# Patient Record
Sex: Female | Born: 1950
Health system: Southern US, Community
[De-identification: ages and names within clinical notes are randomized; demographics above are authoritative.]

## PROBLEM LIST (undated history)

## (undated) DIAGNOSIS — I10 Essential (primary) hypertension: Secondary | ICD-10-CM

## (undated) DIAGNOSIS — Z800 Family history of malignant neoplasm of digestive organs: Secondary | ICD-10-CM

## (undated) DIAGNOSIS — E785 Hyperlipidemia, unspecified: Secondary | ICD-10-CM

## (undated) DIAGNOSIS — M1990 Unspecified osteoarthritis, unspecified site: Secondary | ICD-10-CM

## (undated) DIAGNOSIS — Z8041 Family history of malignant neoplasm of ovary: Secondary | ICD-10-CM

## (undated) DIAGNOSIS — C801 Malignant (primary) neoplasm, unspecified: Secondary | ICD-10-CM

## (undated) DIAGNOSIS — K219 Gastro-esophageal reflux disease without esophagitis: Secondary | ICD-10-CM

## (undated) HISTORY — DX: Gastro-esophageal reflux disease without esophagitis: K21.9

## (undated) HISTORY — DX: Unspecified osteoarthritis, unspecified site: M19.90

## (undated) HISTORY — PX: ABDOMINAL HYSTERECTOMY: SHX81

## (undated) HISTORY — DX: Family history of malignant neoplasm of ovary: Z80.41

## (undated) HISTORY — DX: Family history of malignant neoplasm of digestive organs: Z80.0

## (undated) HISTORY — DX: Hyperlipidemia, unspecified: E78.5

## (undated) HISTORY — DX: Essential (primary) hypertension: I10

## (undated) HISTORY — PX: COLONOSCOPY: SHX174

## (undated) HISTORY — DX: Malignant (primary) neoplasm, unspecified: C80.1

---
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
## 1999-01-06 ENCOUNTER — Emergency Department (HOSPITAL_COMMUNITY): Admission: EM | Admit: 1999-01-06 | Discharge: 1999-01-06 | Payer: Self-pay | Admitting: Emergency Medicine

## 1999-05-17 ENCOUNTER — Other Ambulatory Visit: Admission: RE | Admit: 1999-05-17 | Discharge: 1999-05-17 | Payer: Self-pay | Admitting: Obstetrics & Gynecology

## 2000-05-02 ENCOUNTER — Encounter: Admission: RE | Admit: 2000-05-02 | Discharge: 2000-05-02 | Payer: Self-pay | Admitting: Obstetrics & Gynecology

## 2000-05-02 ENCOUNTER — Encounter: Payer: Self-pay | Admitting: Obstetrics & Gynecology

## 2000-05-30 ENCOUNTER — Other Ambulatory Visit: Admission: RE | Admit: 2000-05-30 | Discharge: 2000-05-30 | Payer: Self-pay | Admitting: Obstetrics & Gynecology

## 2002-06-04 ENCOUNTER — Other Ambulatory Visit: Admission: RE | Admit: 2002-06-04 | Discharge: 2002-06-04 | Payer: Self-pay | Admitting: Obstetrics & Gynecology

## 2002-07-17 ENCOUNTER — Encounter: Payer: Self-pay | Admitting: Obstetrics & Gynecology

## 2002-07-17 ENCOUNTER — Encounter: Admission: RE | Admit: 2002-07-17 | Discharge: 2002-07-17 | Payer: Self-pay | Admitting: Obstetrics & Gynecology

## 2003-03-24 ENCOUNTER — Encounter: Payer: Self-pay | Admitting: Obstetrics & Gynecology

## 2003-03-24 ENCOUNTER — Encounter: Admission: RE | Admit: 2003-03-24 | Discharge: 2003-03-24 | Payer: Self-pay | Admitting: Obstetrics & Gynecology

## 2003-10-15 ENCOUNTER — Encounter: Admission: RE | Admit: 2003-10-15 | Discharge: 2003-10-15 | Payer: Self-pay | Admitting: Obstetrics & Gynecology

## 2003-10-29 ENCOUNTER — Other Ambulatory Visit: Admission: RE | Admit: 2003-10-29 | Discharge: 2003-10-29 | Payer: Self-pay | Admitting: Obstetrics & Gynecology

## 2003-11-20 ENCOUNTER — Emergency Department (HOSPITAL_COMMUNITY): Admission: AD | Admit: 2003-11-20 | Discharge: 2003-11-20 | Payer: Self-pay | Admitting: Internal Medicine

## 2003-11-26 ENCOUNTER — Ambulatory Visit (HOSPITAL_BASED_OUTPATIENT_CLINIC_OR_DEPARTMENT_OTHER): Admission: RE | Admit: 2003-11-26 | Discharge: 2003-11-26 | Payer: Self-pay | Admitting: Orthopedic Surgery

## 2004-04-03 ENCOUNTER — Encounter: Admission: RE | Admit: 2004-04-03 | Discharge: 2004-04-03 | Payer: Self-pay | Admitting: Family Medicine

## 2004-11-02 ENCOUNTER — Other Ambulatory Visit: Admission: RE | Admit: 2004-11-02 | Discharge: 2004-11-02 | Payer: Self-pay | Admitting: Obstetrics & Gynecology

## 2005-03-30 ENCOUNTER — Encounter: Admission: RE | Admit: 2005-03-30 | Discharge: 2005-03-30 | Payer: Self-pay | Admitting: Internal Medicine

## 2005-04-25 ENCOUNTER — Other Ambulatory Visit: Admission: RE | Admit: 2005-04-25 | Discharge: 2005-04-25 | Payer: Self-pay | Admitting: Interventional Radiology

## 2005-04-25 ENCOUNTER — Encounter: Admission: RE | Admit: 2005-04-25 | Discharge: 2005-04-25 | Payer: Self-pay | Admitting: Internal Medicine

## 2005-07-16 ENCOUNTER — Encounter: Admission: RE | Admit: 2005-07-16 | Discharge: 2005-07-16 | Payer: Self-pay | Admitting: Internal Medicine

## 2005-08-23 ENCOUNTER — Ambulatory Visit: Payer: Self-pay | Admitting: Internal Medicine

## 2005-10-05 ENCOUNTER — Ambulatory Visit: Payer: Self-pay | Admitting: Internal Medicine

## 2005-10-15 ENCOUNTER — Ambulatory Visit: Payer: Self-pay | Admitting: Internal Medicine

## 2006-09-10 HISTORY — PX: BIOPSY THYROID: PRO38

## 2010-10-26 ENCOUNTER — Encounter: Payer: Self-pay | Admitting: Internal Medicine

## 2010-11-01 NOTE — Letter (Signed)
Summary: Colonoscopy Letter   Gastroenterology  [address] [place], [place] [address]   Phone: [phone]  Fax: [phone]      October 26, 2010 MRN: [id number]   [facility] [address] [place], [place]  [address]   Dear Ms. HUARD,   According to your medical record, it is time for you to schedule a Colonoscopy. The American Cancer Society recommends this procedure as a method to detect early colon cancer. Patients with a family history of colon cancer, or a personal history of colon polyps or inflammatory bowel disease are at increased risk.  This letter has been generated based on the recommendations made at the time of your procedure. If you feel that in your particular situation this may no longer apply, please contact our office.  Please call our office at [phone] to schedule this appointment or to update your records at your earliest convenience.  Thank you for cooperating with [place] to provide you with the very best care possible.   Sincerely,  Hedwig Morton. Juanda Chance, M.D.  [facility] [phone]

## 2012-05-27 ENCOUNTER — Encounter: Payer: Self-pay | Admitting: Internal Medicine

## 2012-06-30 ENCOUNTER — Other Ambulatory Visit: Payer: Self-pay | Admitting: Obstetrics & Gynecology

## 2013-07-13 ENCOUNTER — Other Ambulatory Visit: Payer: Self-pay | Admitting: Obstetrics & Gynecology

## 2013-11-27 ENCOUNTER — Other Ambulatory Visit: Payer: Self-pay | Admitting: Family

## 2013-11-27 DIAGNOSIS — I10 Essential (primary) hypertension: Secondary | ICD-10-CM

## 2013-11-27 DIAGNOSIS — Z8639 Personal history of other endocrine, nutritional and metabolic disease: Secondary | ICD-10-CM

## 2013-11-27 DIAGNOSIS — E785 Hyperlipidemia, unspecified: Secondary | ICD-10-CM

## 2013-11-27 DIAGNOSIS — R42 Dizziness and giddiness: Secondary | ICD-10-CM

## 2013-12-03 ENCOUNTER — Ambulatory Visit
Admission: RE | Admit: 2013-12-03 | Discharge: 2013-12-03 | Disposition: A | Discharge status: Home / Self Care | Payer: Commercial Managed Care - HMO | Source: Ambulatory Visit | Attending: Family | Admitting: Family

## 2013-12-03 DIAGNOSIS — E785 Hyperlipidemia, unspecified: Secondary | ICD-10-CM

## 2013-12-03 DIAGNOSIS — R42 Dizziness and giddiness: Secondary | ICD-10-CM

## 2013-12-03 DIAGNOSIS — I10 Essential (primary) hypertension: Secondary | ICD-10-CM

## 2013-12-03 DIAGNOSIS — Z8639 Personal history of other endocrine, nutritional and metabolic disease: Secondary | ICD-10-CM

## 2013-12-09 ENCOUNTER — Other Ambulatory Visit: Payer: Self-pay | Admitting: Family

## 2013-12-09 DIAGNOSIS — E041 Nontoxic single thyroid nodule: Secondary | ICD-10-CM

## 2013-12-18 ENCOUNTER — Encounter: Payer: Self-pay | Admitting: Internal Medicine

## 2013-12-18 ENCOUNTER — Ambulatory Visit (INDEPENDENT_AMBULATORY_CARE_PROVIDER_SITE_OTHER): Payer: Commercial Managed Care - HMO | Admitting: Internal Medicine

## 2013-12-18 VITALS — BP 100/68 | HR 53 | Ht <= 58 in | Wt 149.0 lb

## 2013-12-18 DIAGNOSIS — R079 Chest pain, unspecified: Secondary | ICD-10-CM | POA: Diagnosis not present

## 2013-12-18 DIAGNOSIS — E785 Hyperlipidemia, unspecified: Secondary | ICD-10-CM

## 2013-12-18 DIAGNOSIS — I10 Essential (primary) hypertension: Secondary | ICD-10-CM | POA: Diagnosis not present

## 2013-12-18 NOTE — Patient Instructions (Signed)
Talk to your doctor about possibly decreasing your metoprolol due to low HR  Your physician has requested that you have an exercise tolerance test. For further information please visit [email or web address]. Please also follow instruction sheet, as given. We will call you with the results.   Your physician recommends that you schedule a follow-up appointment as needed.

## 2013-12-18 NOTE — Progress Notes (Signed)
OFFICE NOTE  Chief Complaint:  Chest pain  Primary Care Physician: Melissa Levels, NP  HPI:  Melissa Simpson is a pleasant 63 year old female kindly referred to me for evaluation of chest pain. As you know she is a past medical history significant for hypertension and dyslipidemia. There is a family history of heart disease in her father as well as hypertension in the family. Recently she's been having some vague chest discomfort in the left side of the sternum. Symptoms are not necessarily worse with exertion or relieved by rest. It did not radiate. He described as a 1-2/10 in intensity. She has not noticed a recent increase in the symptoms. They are not worse after eating.  She denies any palpitations, diaphoresis, nausea, presyncope or syncopal symptoms. It should be noted that her blood pressure is low normal today and her heart rate is in the low 50s. She reports being asymptomatic as far as fatigue is concerned.  PMHx:  Past Medical History  Diagnosis Date  . GERD (gastroesophageal reflux disease)   . Hypertension   . Hyperlipidemia     Past Surgical History  Procedure Laterality Date  . Biopsy thyroid      FAMHx:  Family History  Problem Relation Age of Onset  . Hypertension Father   . Diabetes Father   . Coronary artery disease Father   . Hyperlipidemia Father   . Colon cancer Father   . Hypertension Mother     SOCHx:   reports that she has never smoked. She has never used smokeless tobacco. She reports that she does not drink alcohol or use illicit drugs.  ALLERGIES:  No Known Allergies  ROS: A comprehensive review of systems was negative except for: Cardiovascular: positive for chest pain  HOME MEDS: Current Outpatient Prescriptions  Medication Sig Dispense Refill  . losartan-hydrochlorothiazide (HYZAAR) 100-12.5 MG per tablet Take 1 tablet by mouth daily.      . metoprolol tartrate (LOPRESSOR) 25 MG tablet Take 25 mg by mouth 2 (two) times daily.        . Multiple Vitamin (CALCIUM COMPLEX PO) Take by mouth daily. 750mg  calcium, 150mg  magnesium, 300 IU vit D      . Multiple Vitamin (MULTIVITAMIN) tablet Take 2 tablets by mouth daily.      . Multiple Vitamins-Minerals (ANTIOXIDANT PO) Take by mouth daily.      Marland Kitchen omeprazole (PRILOSEC) 20 MG capsule Take 20 mg by mouth daily.      Marland Kitchen OVER THE COUNTER MEDICATION daily. Replenex - chondroitin, MSM, glucosamine, ginger, green tea      . OVER THE COUNTER MEDICATION daily. Phytomega - phytosterol esters 1000mg /fishoil 500mg /C&E      . Probiotic Product (PROBIOTIC DAILY PO) Take by mouth daily.      . simvastatin (ZOCOR) 20 MG tablet Take 20 mg by mouth daily.       No current facility-administered medications for this visit.    LABS/IMAGING: No results found for this or any previous visit (from the past 48 hour(s)). No results found.  VITALS: BP 100/68  Pulse 53  Ht 4\' 10"  (1.473 m)  Wt 149 lb (67.586 kg)  BMI 31.15 kg/m2  EXAM: General appearance: alert and no distress Neck: no carotid bruit and no JVD Lungs: clear to auscultation bilaterally Heart: regular rate and rhythm, S1, S2 normal, no murmur, click, rub or gallop Abdomen: soft, non-tender; bowel sounds normal; no masses,  no organomegaly Extremities: extremities normal, atraumatic, no cyanosis or edema Pulses: 2+  and symmetric Skin: Skin color, texture, turgor normal. No rashes or lesions Neurologic: Grossly normal Psych: Mood, affect normal  EKG: Sinus bradycardia at 53, nonspecific T wave changes  ASSESSMENT: 1. Atypical chest pain 2. Hypertension-controlled 3. Dyslipidemia-on statin  PLAN: 1.   Melissa Simpson is describing a chest discomfort which sounds somewhat atypical. It could be musculoskeletal or perhaps neuropathic in the chest wall. There does not seem to be association with exertion. She's not very physically active and is desiring to do more exercise. Based on this, a treadmill stress test for risk  stratification is feasible. Her blood pressure is well controlled and she is on cholesterol medication. Her heart rate is low normal with an associated low blood pressure today. If this persists, she may need to decrease her Lopressor to 12.5 mg twice daily. I will defer this to her primary care provider.  I will be in contact with her for followup with results of her treadmill stress test.  Thanks again for the kind referral.  Pixie Casino, MD, [facility] Attending Cardiologist [place] 12/18/2013, 3:12 PM

## 2013-12-24 ENCOUNTER — Other Ambulatory Visit (HOSPITAL_COMMUNITY)
Admission: RE | Admit: 2013-12-24 | Discharge: 2013-12-24 | Disposition: A | Discharge status: Home / Self Care | Payer: Medicare HMO | Source: Ambulatory Visit | Attending: Interventional Radiology | Admitting: Interventional Radiology

## 2013-12-24 ENCOUNTER — Ambulatory Visit
Admission: RE | Admit: 2013-12-24 | Discharge: 2013-12-24 | Disposition: A | Discharge status: Home / Self Care | Payer: Commercial Managed Care - HMO | Source: Ambulatory Visit | Attending: Family | Admitting: Family

## 2013-12-24 DIAGNOSIS — E041 Nontoxic single thyroid nodule: Secondary | ICD-10-CM

## 2013-12-31 ENCOUNTER — Encounter (HOSPITAL_COMMUNITY): Payer: Commercial Managed Care - HMO

## 2014-07-20 ENCOUNTER — Other Ambulatory Visit: Payer: Self-pay | Admitting: Obstetrics & Gynecology

## 2014-07-21 LAB — CYTOLOGY - PAP

## 2015-01-05 DIAGNOSIS — E781 Pure hyperglyceridemia: Secondary | ICD-10-CM | POA: Diagnosis not present

## 2015-01-05 DIAGNOSIS — N183 Chronic kidney disease, stage 3 (moderate): Secondary | ICD-10-CM | POA: Diagnosis not present

## 2015-01-05 DIAGNOSIS — I10 Essential (primary) hypertension: Secondary | ICD-10-CM | POA: Diagnosis not present

## 2015-01-05 DIAGNOSIS — R7301 Impaired fasting glucose: Secondary | ICD-10-CM | POA: Diagnosis not present

## 2015-01-05 DIAGNOSIS — K219 Gastro-esophageal reflux disease without esophagitis: Secondary | ICD-10-CM | POA: Diagnosis not present

## 2015-01-18 DIAGNOSIS — D271 Benign neoplasm of left ovary: Secondary | ICD-10-CM | POA: Diagnosis not present

## 2015-05-26 ENCOUNTER — Encounter: Payer: Self-pay | Admitting: *Deleted

## 2015-06-02 ENCOUNTER — Encounter: Payer: Self-pay | Admitting: Internal Medicine

## 2015-06-02 ENCOUNTER — Ambulatory Visit (INDEPENDENT_AMBULATORY_CARE_PROVIDER_SITE_OTHER): Payer: Commercial Managed Care - HMO | Admitting: Internal Medicine

## 2015-06-02 VITALS — BP 140/80 | HR 55 | Temp 97.9°F | Resp 20 | Ht <= 58 in | Wt 152.0 lb

## 2015-06-02 DIAGNOSIS — K219 Gastro-esophageal reflux disease without esophagitis: Secondary | ICD-10-CM | POA: Diagnosis not present

## 2015-06-02 DIAGNOSIS — E785 Hyperlipidemia, unspecified: Secondary | ICD-10-CM

## 2015-06-02 DIAGNOSIS — J320 Chronic maxillary sinusitis: Secondary | ICD-10-CM | POA: Diagnosis not present

## 2015-06-02 DIAGNOSIS — I10 Essential (primary) hypertension: Secondary | ICD-10-CM | POA: Diagnosis not present

## 2015-06-02 DIAGNOSIS — Z800 Family history of malignant neoplasm of digestive organs: Secondary | ICD-10-CM | POA: Diagnosis not present

## 2015-06-02 DIAGNOSIS — E041 Nontoxic single thyroid nodule: Secondary | ICD-10-CM

## 2015-06-02 MED ORDER — SIMVASTATIN 20 MG PO TABS: 20.0000 mg | ORAL_TABLET | Freq: Every day | ORAL | Status: DC

## 2015-06-02 MED ORDER — OMEPRAZOLE 20 MG PO CPDR: 20.0000 mg | DELAYED_RELEASE_CAPSULE | Freq: Every day | ORAL | Status: DC

## 2015-06-02 MED ORDER — METOPROLOL TARTRATE 25 MG PO TABS: 50.0000 mg | ORAL_TABLET | Freq: Two times a day (BID) | ORAL | Status: DC

## 2015-06-02 MED ORDER — LOSARTAN POTASSIUM-HCTZ 100-12.5 MG PO TABS: 1.0000 | ORAL_TABLET | Freq: Every day | ORAL | Status: DC

## 2015-06-02 NOTE — Progress Notes (Signed)
Patient ID: Melissa Simpson, female   DOB: 26-Jul-1951, 64 y.o.   MRN: [id number]   Location:  [facility]  Goals of Care: Advanced Directive information Does patient have an advance directive?: No, Would patient like information on creating an advanced directive?: Yes - Educational materials given  Notes she has done her will with her husband, but unsure if that part is included.  She's going to check.  Chief Complaint  Patient presents with  . Establish Care  . Medical Management of Chronic Issues    HPI: Patient is a 64 y.o. white female seen in the office today to establish care and for medical mgt of her chronic diseases. Her mother is also my patient.  Previous PCP:  NP Eloise Levels.  No active concerns.    GERD: Well controlled recently with omeprazole.  No problems since taking.  HTN:  Continues with hyzaar and lopressor.  Tolerating well.  Was 132 at home.  Changes between checks.  Did take her medication just before coming in.  Has been getting more exercise--got bowflex treadclimber--working up on it--has had it 2-3 wks.  Gets really short of breath at first.    Hyperlipidemia:  On zocor.  No muscle cramps.  Tolerating.    Gyn is Dr. Vania Rea.  Had pap, mammogram through him. Will return in Nov.  Had cscope 07/16/05 (CT virtual).    Takes black cohosh for hot flashes.  Feels it helps--does not stop them--less frequent.   Takes ca with D and Mag in it.   Has had bone density with Dr. Stann Mainland.  Had prevnar 9-06/2014 at [facility].  Influenza 2015 and already had this year.  Shingles 2014.      Depression screen PHQ 2/9 06/02/2015  Decreased Interest 0  Down, Depressed, Hopeless 0  PHQ - 2 Score 0   Fall Risk  06/02/2015  Falls in the past year? No   Caregiver stress:  Normally her mother does not create too much stress.  Mother feeds dogs and it makes them fight. Has had to separate the dogs.    Review of Systems:  Review of  Systems  Constitutional: Negative for fever, chills and malaise/fatigue.       Weight gain  HENT: Negative for congestion.        Sinus and allergy problems  Eyes: Negative for blurred vision.       Corrective lenses  Respiratory: Negative for cough and shortness of breath.   Cardiovascular: Negative for chest pain, palpitations and leg swelling.       HTN  Gastrointestinal: Negative for abdominal pain, constipation, blood in stool and melena.  Genitourinary: Negative for dysuria, urgency and frequency.  Musculoskeletal: Positive for joint pain. Negative for falls.       Thumbs, knees, lower back  Skin: Negative for rash.  Neurological: Positive for headaches. Negative for dizziness, loss of consciousness and weakness.  Endo/Heme/Allergies:       Hot flashes  Psychiatric/Behavioral: Negative for depression and memory loss.       Very minimal caregiver stress    Past Medical History  Diagnosis Date  . GERD (gastroesophageal reflux disease)   . Hypertension   . Hyperlipidemia     Past Surgical History  Procedure Laterality Date  . Biopsy thyroid    . Tonsilectomy, adenoidectomy, bilateral myringotomy and tubes Bilateral 1957   Social History   Social History  . Marital Status: Single    Spouse Name:  N/A  . Number of Children: N/A  . Years of Education: N/A   Occupational History  . Not on file.   Social History Main Topics  . Smoking status: Never Smoker   . Smokeless tobacco: Never Used  . Alcohol Use: No  . Drug Use: No  . Sexual Activity: Not on file   Other Topics Concern  . Not on file   Social History Narrative   Diet:Unrestricted   Do you drink/eat things with caffeine? Rarely   Marital status: Married                             What year were you married? 1992   Do you live in a house, apartment, assisted living, condo, trailer, etc)? House   Is it one or more stories? 1   How many persons live in your home? 3   Do you have any pets in your home?   2 small dogs   Current or past profession: Chief of Staff   Do you exercise?                                                     Type & how often:   Do you have a living will? No   Do you have a DNR Form? No   Do you have a POA/HPOA forms? NO   Family History  Problem Relation Age of Onset  . Hypertension Father   . Diabetes Father   . Coronary artery disease Father   . Hyperlipidemia Father   . Colon cancer Father   . Hypertension Mother   . Arthritis Mother     No Known Allergies Medications: Patient's Medications  New Prescriptions   No medications on file  Previous Medications   B COMPLEX VITAMINS TABLET    Take 1 tablet by mouth daily.   BLACK COHOSH 40 MG CAPS    Take 40 mg by mouth daily.   LOSARTAN-HYDROCHLOROTHIAZIDE (HYZAAR) 100-12.5 MG PER TABLET    Take 1 tablet by mouth daily.   METOPROLOL TARTRATE (LOPRESSOR) 25 MG TABLET    Take 50 mg by mouth 2 (two) times daily.    MULTIPLE VITAMIN (CALCIUM COMPLEX PO)    Take by mouth daily. 750mg  calcium, 150mg  magnesium, 300 IU vit D   MULTIPLE VITAMIN (MULTIVITAMIN) TABLET    Take 2 tablets by mouth daily.   OMEPRAZOLE (PRILOSEC) 20 MG CAPSULE    Take 20 mg by mouth daily.   OVER THE COUNTER MEDICATION    daily. Replenex - chondroitin, MSM, glucosamine, ginger, green tea   OVER THE COUNTER MEDICATION    daily. Phytomega - phytosterol esters 1000mg /fishoil 500mg /C&E   PROBIOTIC PRODUCT (PROBIOTIC DAILY PO)    Take by mouth daily.   SIMVASTATIN (ZOCOR) 20 MG TABLET    Take 20 mg by mouth daily.   VITAMIN C (ASCORBIC ACID) 500 MG TABLET    Take 500 mg by mouth daily.  Modified Medications   No medications on file  Discontinued Medications   No medications on file    Physical Exam: Filed Vitals:   06/02/15 0838  BP: 142/84  Pulse: 55  Temp: 97.9 F (36.6 C)  TempSrc: Oral  Resp: 20  Height: 4\' 10"  (1.473 m)  Weight: 152 lb (68.947  kg)  SpO2: 97%   Physical Exam  Constitutional: She is oriented to person,  place, and time. She appears well-developed and well-nourished. No distress.  Cardiovascular: Normal rate, regular rhythm, normal heart sounds and intact distal pulses.   Pulmonary/Chest: Effort normal and breath sounds normal. No respiratory distress.  Abdominal: Bowel sounds are normal.  Musculoskeletal: Normal range of motion.  Neurological: She is alert and oriented to person, place, and time.  Skin: Skin is warm and dry.  Repeat BP 140/80  Labs reviewed: Unsure when last labs were--awaiting records requested from [facility]   Assessment/Plan 1. Essential hypertension, benign -needed all of her meds renewed and sent to mail order pharmacy manually by fax she provided info for b/c "number in the system was wrong" per CMA -bp was slightly up this am -checks at home and was at goal -cont losartan/hcta and lopressor and reassess next visit -cont to monitor at home -cont 3x weekly exercise -discussed cutting back on sodium   2. Hyperlipidemia -cont zocor and reassess flp -encouraged exercise  3. Gastroesophageal reflux disease, esophagitis presence not specified -cont prilosec, asymptomatic while taking it  4. Family history of colon cancer -due for cscope in Nov 2016--is very hesitant about getting it  5. Chronic maxillary sinusitis -has tried otc allergy meds before, but does not feel they were helpful enough to continue  6.  Left thyroid nodule -had prior biopsy 12/24/13 of nodule--path must have been [facility] to [facility] b/c it's not in epic  Labs/tests ordered:    Orders Placed This Encounter  Procedures  . CBC with Differential/Platelet  . Comprehensive metabolic panel    Order Specific Question:  Has the patient fasted?    Answer:  Yes  . Hemoglobin A1c  . Lipid panel    Order Specific Question:  Has the patient fasted?    Answer:  Yes  . TSH    Next appt:  3 mos for annual exam (no pap, done at gyn)  Devola. Chamaine Stankus, D.O. [facility] [address], [place] [address] Cell Phone (Mon-Fri 8am-5pm):  [phone] On Call:  [phone] & follow prompts after 5pm & weekends Office Phone:  [phone] Office Fax:  [phone]

## 2015-06-02 NOTE — Patient Instructions (Addendum)
Colonoscopy is recommended in November.   Pneumovax can also be given at next appointment.

## 2015-06-03 DIAGNOSIS — R7989 Other specified abnormal findings of blood chemistry: Secondary | ICD-10-CM

## 2015-06-03 DIAGNOSIS — R945 Abnormal results of liver function studies: Principal | ICD-10-CM

## 2015-06-03 LAB — CBC WITH DIFFERENTIAL/PLATELET
Basophils Absolute: 0.1 10*3/uL (ref 0.0–0.2)
Basos: 1 %
EOS (ABSOLUTE): 0.3 10*3/uL (ref 0.0–0.4)
Eos: 4 %
Hematocrit: 43.8 % (ref 34.0–46.6)
Hemoglobin: 14.7 g/dL (ref 11.1–15.9)
Immature Grans (Abs): 0 10*3/uL (ref 0.0–0.1)
Immature Granulocytes: 0 %
Lymphocytes Absolute: 2.4 10*3/uL (ref 0.7–3.1)
Lymphs: 41 %
MCH: 32.6 pg (ref 26.6–33.0)
MCHC: 33.6 g/dL (ref 31.5–35.7)
MCV: 97 fL (ref 79–97)
Monocytes Absolute: 0.6 10*3/uL (ref 0.1–0.9)
Monocytes: 10 %
Neutrophils Absolute: 2.6 10*3/uL (ref 1.4–7.0)
Neutrophils: 44 %
Platelets: 264 10*3/uL (ref 150–379)
RBC: 4.51 x10E6/uL (ref 3.77–5.28)
RDW: 12.9 % (ref 12.3–15.4)
WBC: 6 10*3/uL (ref 3.4–10.8)

## 2015-06-03 LAB — COMPREHENSIVE METABOLIC PANEL
ALT: 46 IU/L — ABNORMAL HIGH (ref 0–32)
AST: 33 IU/L (ref 0–40)
Albumin/Globulin Ratio: 1.6 (ref 1.1–2.5)
Albumin: 4.3 g/dL (ref 3.6–4.8)
Alkaline Phosphatase: 38 IU/L — ABNORMAL LOW (ref 39–117)
BUN/Creatinine Ratio: 15 (ref 11–26)
BUN: 17 mg/dL (ref 8–27)
Bilirubin Total: 0.9 mg/dL (ref 0.0–1.2)
CO2: 24 mmol/L (ref 18–29)
Calcium: 9.7 mg/dL (ref 8.7–10.3)
Chloride: 101 mmol/L (ref 97–108)
Creatinine, Ser: 1.12 mg/dL — ABNORMAL HIGH (ref 0.57–1.00)
GFR calc Af Amer: 59 mL/min/{1.73_m2} — ABNORMAL LOW (ref >59)
GFR calc non Af Amer: 51 mL/min/{1.73_m2} — ABNORMAL LOW (ref >59)
Globulin, Total: 2.7 g/dL (ref 1.5–4.5)
Glucose: 102 mg/dL — ABNORMAL HIGH (ref 65–99)
Potassium: 3.9 mmol/L (ref 3.5–5.2)
Sodium: 142 mmol/L (ref 134–144)
Total Protein: 7 g/dL (ref 6.0–8.5)

## 2015-06-03 LAB — LIPID PANEL
Chol/HDL Ratio: 3.6 ratio units (ref 0.0–4.4)
Cholesterol, Total: 146 mg/dL (ref 100–199)
HDL: 41 mg/dL (ref >39)
LDL Calculated: 65 mg/dL (ref 0–99)
Triglycerides: 200 mg/dL — ABNORMAL HIGH (ref 0–149)
VLDL Cholesterol Cal: 40 mg/dL (ref 5–40)

## 2015-06-03 LAB — HEMOGLOBIN A1C
Est. average glucose Bld gHb Est-mCnc: 126 mg/dL
Hgb A1c MFr Bld: 6 % — ABNORMAL HIGH (ref 4.8–5.6)

## 2015-06-03 LAB — TSH: TSH: 3.03 u[IU]/mL (ref 0.450–4.500)

## 2015-06-22 ENCOUNTER — Other Ambulatory Visit: Payer: Commercial Managed Care - HMO

## 2015-06-22 DIAGNOSIS — R7989 Other specified abnormal findings of blood chemistry: Secondary | ICD-10-CM

## 2015-06-22 DIAGNOSIS — R945 Abnormal results of liver function studies: Principal | ICD-10-CM

## 2015-06-23 ENCOUNTER — Telehealth: Payer: Self-pay

## 2015-06-23 LAB — HEPATIC FUNCTION PANEL
ALT: 52 IU/L — ABNORMAL HIGH (ref 0–32)
AST: 33 IU/L (ref 0–40)
Albumin: 4.2 g/dL (ref 3.6–4.8)
Alkaline Phosphatase: 40 IU/L (ref 39–117)
Bilirubin Total: 1 mg/dL (ref 0.0–1.2)
Bilirubin, Direct: 0.2 mg/dL (ref 0.00–0.40)
Total Protein: 6.8 g/dL (ref 6.0–8.5)

## 2015-06-23 NOTE — Telephone Encounter (Signed)
Per Dr.Reed- Please notify Melissa Simpson that her liver panel has improved  Patient aware of results and verbalized understanding. Patient questioned if she should remain off statin therapy. I asked Dr.Reed- patient to stay off medication until  Instructed to restart, LFT will be followed up at next ov. Patient verbalized understanding. Removed Statin form medication list

## 2015-07-26 DIAGNOSIS — Z6832 Body mass index (BMI) 32.0-32.9, adult: Secondary | ICD-10-CM | POA: Diagnosis not present

## 2015-07-26 DIAGNOSIS — Z1231 Encounter for screening mammogram for malignant neoplasm of breast: Secondary | ICD-10-CM | POA: Diagnosis not present

## 2015-07-26 DIAGNOSIS — Z01419 Encounter for gynecological examination (general) (routine) without abnormal findings: Secondary | ICD-10-CM | POA: Diagnosis not present

## 2015-08-29 ENCOUNTER — Ambulatory Visit (INDEPENDENT_AMBULATORY_CARE_PROVIDER_SITE_OTHER): Payer: Commercial Managed Care - HMO | Admitting: Internal Medicine

## 2015-08-29 ENCOUNTER — Encounter: Payer: Self-pay | Admitting: Internal Medicine

## 2015-08-29 VITALS — BP 138/82 | HR 52 | Temp 97.9°F | Ht <= 58 in | Wt 151.0 lb

## 2015-08-29 DIAGNOSIS — Z0000 Encounter for general adult medical examination without abnormal findings: Secondary | ICD-10-CM | POA: Diagnosis not present

## 2015-08-29 DIAGNOSIS — I10 Essential (primary) hypertension: Secondary | ICD-10-CM

## 2015-08-29 DIAGNOSIS — Z23 Encounter for immunization: Secondary | ICD-10-CM | POA: Diagnosis not present

## 2015-08-29 DIAGNOSIS — E785 Hyperlipidemia, unspecified: Secondary | ICD-10-CM

## 2015-08-29 DIAGNOSIS — R945 Abnormal results of liver function studies: Secondary | ICD-10-CM

## 2015-08-29 DIAGNOSIS — R7989 Other specified abnormal findings of blood chemistry: Secondary | ICD-10-CM

## 2015-08-29 DIAGNOSIS — R748 Abnormal levels of other serum enzymes: Secondary | ICD-10-CM | POA: Diagnosis not present

## 2015-08-29 DIAGNOSIS — Z780 Asymptomatic menopausal state: Secondary | ICD-10-CM

## 2015-08-29 DIAGNOSIS — K219 Gastro-esophageal reflux disease without esophagitis: Secondary | ICD-10-CM

## 2015-08-29 NOTE — Addendum Note (Signed)
Addended by: Ripley Fraise on: 08/29/2015 10:30 AM   Modules accepted: Orders

## 2015-08-29 NOTE — Progress Notes (Signed)
Patient ID: Melissa Simpson, female   DOB: 08/28/1951, 64 y.o.   MRN: [id number]   Location: [place]  Provider: Rexene Edison. Mariea Clonts, D.O., C.M.D.  Code Status: full code Goals of Care: Advanced Directive information Does patient have an advance directive?: No, Would patient like information on creating an advanced directive?: Yes - Educational materials given  Chief Complaint  Patient presents with  . Annual Exam    Wellness exam    HPI: Patient is a 64 y.o. female seen in the office today for an annual wellness exam and medical mgt of chronic diseases.    Depression screen [facility] 2/9 08/29/2015 06/02/2015  Decreased Interest 0 0  Down, Depressed, Hopeless 0 0  PHQ - 2 Score 0 0    Fall Risk  08/29/2015 06/02/2015  Falls in the past year? No No   Memory is intact.    Health Maintenance  Topic Date Due  . Hepatitis C Screening  [birth year]  . TETANUS/TDAP  09/18/1967  . DEXA SCAN  09/17/2013  . PNA vac Low Risk Adult (1 of 2 - PCV13) 09/17/2013  . COLONOSCOPY  07/17/2015  . INFLUENZA VACCINE  04/10/2016  . MAMMOGRAM  07/11/2016  . ZOSTAVAX  Completed   Urinary incontinence?  No difficulty. Functional status?  Independent. Exercise?  It dwindled.  Has to do it early enough in the am to get showered and get her mom ready. Diet?  Not following a special diet at this time. Vision:  Admits to some difficulty with distance vision.  Could not get an answer when called [employer]--says it's been at least 2 years since she had them done.  Is having headaches on either side of her head--pressure.   Also has sinus congestion and drainage.   Hearing:  Is hearing well. Dentition:  No difficulty in this dept.  Goes next month to dentist. Had mammogram last month.  [place] ob[facility] ordered and also does pelvic exam. Hep C screening:  Has never had transfusion.    No problems from her acid indigestion.  On omeprazole.    HTN:  bp is at goal with her current medication--hyzaar,  metoprolol  HL:  Working on diet and exercise, but needs to improve this.  Elevated lfts:  Reason unclear.    Review of Systems:  Review of Systems  Constitutional: Negative for fever, chills, weight loss and malaise/fatigue.  HENT: Positive for congestion. Negative for hearing loss.   Eyes: Positive for blurred vision.       At distances--needs eye exam at [facility]  Respiratory: Negative for shortness of breath.   Cardiovascular: Negative for chest pain and leg swelling.  Gastrointestinal: Positive for heartburn. Negative for abdominal pain, constipation, blood in stool and melena.       Well controlled  Genitourinary: Negative for dysuria, urgency and frequency.  Musculoskeletal: Positive for joint pain. Negative for myalgias and falls.       Knees occasionally  Skin: Negative for rash.  Neurological: Negative for dizziness, loss of consciousness and weakness.  Endo/Heme/Allergies: Does not bruise/bleed easily.  Psychiatric/Behavioral: Negative for depression and memory loss. The patient is not nervous/anxious and does not have insomnia.     Past Medical History  Diagnosis Date  . GERD (gastroesophageal reflux disease)   . Hypertension   . Hyperlipidemia     Past Surgical History  Procedure Laterality Date  . Biopsy thyroid    . Tonsilectomy, adenoidectomy, bilateral myringotomy and tubes Bilateral 1957    The patient  has a family history of  shoulder  No Known Allergies    Medication List       This list is accurate as of: 08/29/15 10:05 AM.  Always use your most recent med list.               b complex vitamins tablet  Take 1 tablet by mouth daily.     Black Cohosh 40 MG Caps  Take 40 mg by mouth daily.     losartan-hydrochlorothiazide 100-12.5 MG tablet  Commonly known as:  HYZAAR  Take 1 tablet by mouth daily.     metoprolol tartrate 25 MG tablet  Commonly known as:  LOPRESSOR  Take 2 tablets (50 mg total) by mouth 2 (two) times daily.      multivitamin tablet  Take 2 tablets by mouth daily.     CALCIUM COMPLEX PO  Take by mouth daily. 750mg  calcium, 150mg  magnesium, 300 IU vit D     omeprazole 20 MG capsule  Commonly known as:  PRILOSEC  Take 1 capsule (20 mg total) by mouth daily.     OVER THE COUNTER MEDICATION  daily. Replenex - chondroitin, MSM, glucosamine, ginger, green tea     OVER THE COUNTER MEDICATION  daily. Phytomega - phytosterol esters 1000mg /fishoil 500mg /C&E     PROBIOTIC DAILY PO  Take by mouth daily.     vitamin C 500 MG tablet  Commonly known as:  ASCORBIC ACID  Take 500 mg by mouth daily.        Physical Exam: Filed Vitals:   08/29/15 0917  BP: 138/82  Pulse: 52  Temp: 97.9 F (36.6 C)  TempSrc: Oral  Height: 4\' 10"  (1.473 m)  Weight: 151 lb (68.493 kg)  SpO2: 95%   Body mass index is 31.57 kg/(m^2). Physical Exam  Constitutional: She is oriented to person, place, and time. She appears well-developed and well-nourished.  HENT:  Head: Normocephalic and atraumatic.  Right Ear: External ear normal.  Left Ear: External ear normal.  Nose: Nose normal.  Mouth/Throat: Oropharynx is clear and moist. No oropharyngeal exudate.  No cerumen; TMs pink with normal light reflex  Eyes: Conjunctivae and EOM are normal. Pupils are equal, round, and reactive to light.  Neck: Normal range of motion. Neck supple. No JVD present. No tracheal deviation present. No thyromegaly present.  Cardiovascular: Normal rate, regular rhythm, normal heart sounds and intact distal pulses.   Abdominal: Soft. Bowel sounds are normal. She exhibits no distension and no mass. There is no tenderness. There is no rebound and no guarding.  Genitourinary:  Deferred--breast and pelvic, rectal exams done by gyn  Musculoskeletal: Normal range of motion. She exhibits no tenderness.  Lymphadenopathy:    She has no cervical adenopathy.  Neurological: She is alert and oriented to person, place, and time. She has normal  reflexes. No cranial nerve deficit.  Skin: Skin is warm and dry.  Psychiatric: She has a normal mood and affect. Her behavior is normal. Judgment and thought content normal.    Labs reviewed: Basic Metabolic Panel:  Recent Labs  06/02/15 1000  NA 142  K 3.9  CL 101  CO2 24  GLUCOSE 102*  BUN 17  CREATININE 1.12*  CALCIUM 9.7  TSH 3.030   Liver Function Tests:  Recent Labs  06/02/15 1000 06/22/15 0905  AST 33 33  ALT 46* 52*  ALKPHOS 38* 40  BILITOT 0.9 1.0  PROT 7.0 6.8  ALBUMIN 4.3 4.2   No results for  input(s): LIPASE, AMYLASE in the last 8760 hours. No results for input(s): AMMONIA in the last 8760 hours. CBC:  Recent Labs  06/02/15 1000  WBC 6.0  NEUTROABS 2.6  HCT 43.8   Lipid Panel:  Recent Labs  06/02/15 1000  CHOL 146  HDL 41  LDLCALC 65  TRIG 200*  CHOLHDL 3.6   Lab Results  Component Value Date   HGBA1C 6.0* 06/02/2015    Assessment/Plan 1. Medicare annual wellness visit, initial -pneumovax given today -work on diet and exercise -get f/u bone density done   2. Primary hypertension - EKG 12-Lead showed NSR at 50bpm, no acute ischemia or infarct -well controlled with current meds  3. Elevated liver function tests - f/u panel today--etiology unclear - Hepatic Function Panel  4. Hyperlipidemia - not on meds--did not tolerate statin. -cont to work on diet and exercise - Lipid panel  5. Gastroesophageal reflux disease, esophagitis presence not specified -stable with omeprazole  6. Postmenopausal estrogen deficiency - overdue for f/u bone density - DG Bone Density; Future  Labs/tests ordered:   Orders Placed This Encounter  Procedures  . DG Bone Density    Standing Status: Future     Number of Occurrences:      Standing Expiration Date: 10/28/2016    Order Specific Question:  Reason for Exam (SYMPTOM  OR DIAGNOSIS REQUIRED)    Answer:  postmenopausal, last study 2011 (heel scan at [facility])    Order Specific  Question:  Preferred imaging location?    Answer:  [facility]  . Lipid panel    Order Specific Question:  Has the patient fasted?    Answer:  Yes  . Hepatic Function Panel  . EKG 12-Lead    Next appt:  6 mos for med mgt  Eber Ferrufino L. Yenesis Even, D.O. [facility] [address], [place] [address] Cell Phone (Mon-Fri 8am-5pm):  [phone] On Call:  [phone] & follow prompts after 5pm & weekends Office Phone:  [phone] Office Fax:  [phone]

## 2015-08-30 LAB — LIPID PANEL
Chol/HDL Ratio: 6.1 ratio units — ABNORMAL HIGH (ref 0.0–4.4)
Cholesterol, Total: 239 mg/dL — ABNORMAL HIGH (ref 100–199)
HDL: 39 mg/dL — ABNORMAL LOW (ref >39)
LDL Calculated: 146 mg/dL — ABNORMAL HIGH (ref 0–99)
Triglycerides: 269 mg/dL — ABNORMAL HIGH (ref 0–149)
VLDL Cholesterol Cal: 54 mg/dL — ABNORMAL HIGH (ref 5–40)

## 2015-08-30 LAB — HEPATIC FUNCTION PANEL
ALT: 40 IU/L — ABNORMAL HIGH (ref 0–32)
AST: 29 IU/L (ref 0–40)
Albumin: 4.4 g/dL (ref 3.6–4.8)
Alkaline Phosphatase: 40 IU/L (ref 39–117)
Bilirubin Total: 0.8 mg/dL (ref 0.0–1.2)
Bilirubin, Direct: 0.17 mg/dL (ref 0.00–0.40)
Total Protein: 7.1 g/dL (ref 6.0–8.5)

## 2015-08-31 LAB — HEPATITIS PANEL, ACUTE
Hep A IgM: NEGATIVE
Hep B C IgM: NEGATIVE
Hep C Virus Ab: <0.1 s/co ratio (ref 0.0–0.9)
Hepatitis B Surface Ag: NEGATIVE

## 2015-08-31 LAB — SPECIMEN STATUS REPORT

## 2015-08-31 NOTE — Addendum Note (Signed)
Addended byMarisa Cyphers C on: 08/31/2015 03:17 PM   Modules accepted: Orders

## 2015-09-09 ENCOUNTER — Ambulatory Visit
Admission: RE | Admit: 2015-09-09 | Discharge: 2015-09-09 | Disposition: A | Discharge status: Home / Self Care | Payer: Commercial Managed Care - HMO | Source: Ambulatory Visit | Attending: Internal Medicine | Admitting: Internal Medicine

## 2015-09-09 DIAGNOSIS — R7989 Other specified abnormal findings of blood chemistry: Secondary | ICD-10-CM

## 2015-09-09 DIAGNOSIS — R945 Abnormal results of liver function studies: Principal | ICD-10-CM

## 2015-09-22 ENCOUNTER — Encounter: Payer: Self-pay | Admitting: Internal Medicine

## 2015-10-03 ENCOUNTER — Other Ambulatory Visit: Payer: Self-pay | Admitting: Internal Medicine

## 2015-10-03 DIAGNOSIS — E2839 Other primary ovarian failure: Secondary | ICD-10-CM

## 2015-10-14 ENCOUNTER — Ambulatory Visit (INDEPENDENT_AMBULATORY_CARE_PROVIDER_SITE_OTHER): Payer: Commercial Managed Care - HMO | Admitting: Internal Medicine

## 2015-10-14 ENCOUNTER — Encounter: Payer: Self-pay | Admitting: Internal Medicine

## 2015-10-14 VITALS — BP 130/80 | HR 69 | Temp 97.5°F | Resp 18 | Wt 150.4 lb

## 2015-10-14 DIAGNOSIS — J0110 Acute frontal sinusitis, unspecified: Secondary | ICD-10-CM

## 2015-10-14 MED ORDER — AZITHROMYCIN 250 MG PO TABS: ORAL_TABLET | ORAL | Status: DC

## 2015-10-14 NOTE — Patient Instructions (Addendum)
Move humidifier to your room temporarily.  Drink plenty of water.  Salt water rinses for sore throat.  Lozenges may also help.  Try using your nettipot to clean out your sinuses.  You may use alka seltzer cold and flu packets day and night.    Use mucinex 600mg  twice daily for congestion.  If you are not starting to feel better by Monday, you may fill the antibiotic (zpak) prescription.

## 2015-10-14 NOTE — Progress Notes (Signed)
Patient ID: Melissa Simpson, female   DOB: 03-20-1951, 65 y.o.   MRN: [id number]   Location: [place] Provider: Rexene Edison. Mariea Clonts, D.O., C.M.D.  Goals of Care: Advanced Directive information Advanced Directives 08/29/2015  Does patient have an advance directive? No  Would patient like information on creating an advanced directive? Yes - Multimedia programmer Complaint  Patient presents with  . Acute Visit    sore throat for 3 days, using chloraseptic spray    HPI: Patient is a 65 y.o. female seen in the office today for an acute visit for a sore throat for 3 days.  She's been using chloraseptic spray.First day, not bad, got worse yesterday and didn't sleep much last night waking up hurting.  Now has headache.  Had a little diarrhea in the night (unsure if related or from flounder in there).  Feels like mucus sitting in her throat.  Can't get it up or out.  Is afraid she'll quit breathing at night.  No fever here, and no chills at home.  No change in coughing (has chronic sinus drainage and some suspected gerd). A bit hoarse today.  No sick contacts known.  Appetite decreased this am.  Did not eat.  Throat hurts too much, too.  Increased nasal pressure also.    Review of Systems:  Review of Systems  Constitutional: Positive for malaise/fatigue. Negative for fever and chills.  HENT: Positive for congestion and sore throat. Negative for ear pain and hearing loss.        Hoarse  Eyes: Negative for pain, discharge and redness.  Respiratory: Positive for cough. Negative for sputum production, shortness of breath and wheezing.        Chronic cough, unchanged  Cardiovascular: Negative for chest pain.       Notes a pressure last night, indigestion and later diarrhea  Gastrointestinal: Positive for heartburn and diarrhea. Negative for nausea, vomiting, abdominal pain, constipation, blood in stool and melena.  Genitourinary: Negative for dysuria.  Musculoskeletal: Negative  for myalgias.  Skin: Negative for rash.  Neurological: Positive for headaches.    Past Medical History  Diagnosis Date  . GERD (gastroesophageal reflux disease)   . Hypertension   . Hyperlipidemia     Past Surgical History  Procedure Laterality Date  . Biopsy thyroid    . Tonsilectomy, adenoidectomy, bilateral myringotomy and tubes Bilateral 1957    No Known Allergies    Medication List       This list is accurate as of: 10/14/15 10:42 AM.  Always use your most recent med list.               b complex vitamins tablet  Take 1 tablet by mouth daily.     Black Cohosh 40 MG Caps  Take 40 mg by mouth daily.     losartan-hydrochlorothiazide 100-12.5 MG tablet  Commonly known as:  HYZAAR  Take 1 tablet by mouth daily.     metoprolol tartrate 25 MG tablet  Commonly known as:  LOPRESSOR  Take 2 tablets (50 mg total) by mouth 2 (two) times daily.     multivitamin tablet  Take 2 tablets by mouth daily.     CALCIUM COMPLEX PO  Take by mouth daily. 750mg  calcium, 150mg  magnesium, 300 IU vit D     omeprazole 20 MG capsule  Commonly known as:  PRILOSEC  Take 1 capsule (20 mg total) by mouth daily.     OVER THE COUNTER  MEDICATION  daily. Replenex - chondroitin, MSM, glucosamine, ginger, green tea     OVER THE COUNTER MEDICATION  daily. Phytomega - phytosterol esters 1000mg /fishoil 500mg /C&E     PROBIOTIC DAILY PO  Take by mouth daily.     simvastatin 20 MG tablet  Commonly known as:  ZOCOR  Take 20 mg by mouth at bedtime.     vitamin C 500 MG tablet  Commonly known as:  ASCORBIC ACID  Take 500 mg by mouth daily.        Physical Exam: Filed Vitals:   10/14/15 1031  BP: 130/80  Pulse: 69  Temp: 97.5 F (36.4 C)  TempSrc: Oral  Resp: 18  Weight: 150 lb 6.4 oz (68.221 kg)  SpO2: 95%   Body mass index is 31.44 kg/(m^2). Physical Exam  Constitutional: She appears well-developed and well-nourished. No distress.  HENT:  Head: Normocephalic and  atraumatic.  Right Ear: External ear normal.  Left Ear: External ear normal.  Nose: Nose normal.  Mouth/Throat: Oropharynx is clear and moist. No oropharyngeal exudate.  Erythema of oropharynx, but no visible plaques/white exudate; pressure over right frontal sinus; both tms pink with normal light reflex intact  Eyes: Conjunctivae are normal.  Neck: Neck supple.  Cardiovascular: Normal rate, regular rhythm and normal heart sounds.   Pulmonary/Chest: Effort normal and breath sounds normal. No respiratory distress. She has no wheezes. She has no rales.  Scarce upper airway congestive sounds anteriorly  Lymphadenopathy:    She has no cervical adenopathy.  Skin: Skin is warm and dry.  Psychiatric: She has a normal mood and affect.    Labs reviewed: Basic Metabolic Panel:  Recent Labs  06/02/15 1000  NA 142  K 3.9  CL 101  CO2 24  GLUCOSE 102*  BUN 17  CREATININE 1.12*  CALCIUM 9.7  TSH 3.030   Liver Function Tests:  Recent Labs  06/02/15 1000 06/22/15 0905 08/29/15 1016  AST 33 33 29  ALT 46* 52* 40*  ALKPHOS 38* 40 40  BILITOT 0.9 1.0 0.8  PROT 7.0 6.8 7.1  ALBUMIN 4.3 4.2 4.4   No results for input(s): LIPASE, AMYLASE in the last 8760 hours. No results for input(s): AMMONIA in the last 8760 hours. CBC:  Recent Labs  06/02/15 1000  WBC 6.0  NEUTROABS 2.6  HCT 43.8  MCV 97  PLT 264   Lipid Panel:  Recent Labs  06/02/15 1000 08/29/15 1016  CHOL 146 239*  HDL 41 39*  LDLCALC 65 146*  TRIG 200* 269*  CHOLHDL 3.6 6.1*   Lab Results  Component Value Date   HGBA1C 6.0* 06/02/2015    Assessment/Plan 1. Acute frontal sinusitis, recurrence not specified - advised to use conservative therapies including humifier, hydration, mucinex, salt water rinses for sore throat, lozenges if needed, nettipot to help clean out sinuses and decrease PND, alka seltzer cold and flu packets temporarily -if not improving by Monday (infection will not be gone but should  be getting better), fill zpak Rx - azithromycin (ZITHROMAX) 250 MG tablet; Two tabs first day, one tab daily for 4 days thereafter  Dispense: 6 tablet; Refill: 0  Labs/tests ordered:  No new Next appt:  Keep 02/27/2016 appt, return prn  Jenyfer Trawick L. Asuzena Weis, D.O. [facility] [address], [place] [address] Cell Phone (Mon-Fri 8am-5pm):  [phone] On Call:  [phone] & follow prompts after 5pm & weekends Office Phone:  [phone] Office Fax:  [phone]

## 2015-10-25 ENCOUNTER — Ambulatory Visit
Admission: RE | Admit: 2015-10-25 | Discharge: 2015-10-25 | Disposition: A | Discharge status: Home / Self Care | Payer: Commercial Managed Care - HMO | Source: Ambulatory Visit | Attending: Internal Medicine | Admitting: Internal Medicine

## 2015-10-25 DIAGNOSIS — M810 Age-related osteoporosis without current pathological fracture: Secondary | ICD-10-CM | POA: Diagnosis not present

## 2015-10-25 DIAGNOSIS — E2839 Other primary ovarian failure: Secondary | ICD-10-CM

## 2015-10-26 ENCOUNTER — Other Ambulatory Visit: Payer: Self-pay | Admitting: *Deleted

## 2015-10-26 MED ORDER — ALENDRONATE SODIUM 70 MG PO TABS: 70.0000 mg | ORAL_TABLET | ORAL | Status: DC

## 2015-12-06 DIAGNOSIS — I10 Essential (primary) hypertension: Secondary | ICD-10-CM | POA: Diagnosis not present

## 2015-12-06 DIAGNOSIS — H5210 Myopia, unspecified eye: Secondary | ICD-10-CM | POA: Diagnosis not present

## 2016-01-10 ENCOUNTER — Other Ambulatory Visit: Payer: Self-pay | Admitting: Internal Medicine

## 2016-02-27 ENCOUNTER — Encounter: Payer: Self-pay | Admitting: Internal Medicine

## 2016-02-27 ENCOUNTER — Ambulatory Visit (INDEPENDENT_AMBULATORY_CARE_PROVIDER_SITE_OTHER): Payer: Commercial Managed Care - HMO | Admitting: Internal Medicine

## 2016-02-27 VITALS — BP 120/80 | HR 50 | Temp 98.0°F | Ht <= 58 in | Wt 154.0 lb

## 2016-02-27 DIAGNOSIS — I10 Essential (primary) hypertension: Secondary | ICD-10-CM | POA: Diagnosis not present

## 2016-02-27 DIAGNOSIS — R7989 Other specified abnormal findings of blood chemistry: Secondary | ICD-10-CM | POA: Diagnosis not present

## 2016-02-27 DIAGNOSIS — M25511 Pain in right shoulder: Secondary | ICD-10-CM | POA: Diagnosis not present

## 2016-02-27 DIAGNOSIS — G8929 Other chronic pain: Secondary | ICD-10-CM | POA: Diagnosis not present

## 2016-02-27 DIAGNOSIS — M79672 Pain in left foot: Secondary | ICD-10-CM

## 2016-02-27 DIAGNOSIS — M25551 Pain in right hip: Secondary | ICD-10-CM

## 2016-02-27 DIAGNOSIS — Z636 Dependent relative needing care at home: Secondary | ICD-10-CM

## 2016-02-27 DIAGNOSIS — E785 Hyperlipidemia, unspecified: Secondary | ICD-10-CM | POA: Diagnosis not present

## 2016-02-27 DIAGNOSIS — R945 Abnormal results of liver function studies: Secondary | ICD-10-CM

## 2016-02-27 NOTE — Progress Notes (Signed)
Location:  [facility] Provider:  Jolena Kittle L. Mariea Clonts, D.O., C.M.D.  Code Status: full code Goals of Care:  Advanced Directives 08/29/2015  Does patient have an advance directive? No  Would patient like information on creating an advanced directive? Yes - Geneticist, molecular Complaint  Patient presents with  . Medical Management of Chronic Issues    63mth follow-up    HPI: Patient is a 65 y.o. female seen today for medical management of chronic diseases.    Basically ok except new aches and pains.  Years ago she had an MRI of her right shoulder--nothing was done about something being torn.  Quit using her right arm for her purse.  Started back up.  Hurts if she reaches for things.  Arthritis rub helps sometimes, but not always.    Left bottom of her food is puffy and feels like little pins are in there   Only sometimes hurts, not all of the time.  Only in the ball of her foot, nowhere else.    Discussed diet and exercise.  Asks for handouts on diet.  Not really exercising.  Tried the bowflex treadclimber.  Wears her out in less than 10 mins.  Right hip joint hurts when she walks.  That starts to bother her when she walks.  Does not know how to swim.  Might try her bike slowly again.    Past Medical History  Diagnosis Date  . GERD (gastroesophageal reflux disease)   . Hypertension   . Hyperlipidemia     Past Surgical History  Procedure Laterality Date  . Biopsy thyroid    . Tonsilectomy, adenoidectomy, bilateral myringotomy and tubes Bilateral 1957    No Known Allergies    Medication List       This list is accurate as of: 02/27/16 10:36 AM.  Always use your most recent med list.               alendronate 70 MG tablet  Commonly known as:  FOSAMAX  TAKE 1 TABLET (70 MG TOTAL) BY MOUTH ONCE A WEEK. TAKE WITH A FULL GLASS OF WATER ON AN EMPTY STOMACH.     b complex vitamins tablet  Take 1 tablet by mouth daily.     Black Cohosh 40 MG Caps  Take 40 mg  by mouth daily.     CALCIUM 600 + MINERALS PO  Take by mouth 2 (two) times daily.     losartan-hydrochlorothiazide 100-12.5 MG tablet  Commonly known as:  HYZAAR  Take 1 tablet by mouth daily.     metoprolol 50 MG tablet  Commonly known as:  LOPRESSOR  Take 50 mg by mouth 2 (two) times daily.     multivitamin tablet  Take 2 tablets by mouth daily.     omeprazole 20 MG capsule  Commonly known as:  PRILOSEC  Take 1 capsule (20 mg total) by mouth daily.     OVER THE COUNTER MEDICATION  daily. Replenex - chondroitin, MSM, glucosamine, ginger, green tea     OVER THE COUNTER MEDICATION  daily. Phytomega - phytosterol esters 1000mg /fishoil 500mg /C&E     PROBIOTIC DAILY PO  Take by mouth daily.     simvastatin 20 MG tablet  Commonly known as:  ZOCOR  Take 20 mg by mouth at bedtime.     vitamin C 500 MG tablet  Commonly known as:  ASCORBIC ACID  Take 500 mg by mouth daily.       Review  of Systems:  Review of Systems  Constitutional: Negative for fever and chills.  HENT: Negative for hearing loss.   Eyes: Negative for blurred vision.  Respiratory: Negative for shortness of breath.   Cardiovascular: Negative for chest pain and leg swelling.  Gastrointestinal: Negative for abdominal pain, constipation, blood in stool and melena.  Genitourinary: Negative for dysuria, urgency and frequency.  Musculoskeletal: Positive for myalgias and joint pain. Negative for falls.       Right shoulder, right hip, bottom of left foot  Neurological: Negative for dizziness and loss of consciousness.  Psychiatric/Behavioral: Negative for depression and memory loss.       Caregiver stress    Health Maintenance  Topic Date Due  . TETANUS/TDAP  09/18/1967  . COLONOSCOPY  10/16/2015  . INFLUENZA VACCINE  04/10/2016  . MAMMOGRAM  07/11/2016  . PNA vac Low Risk Adult (2 of 2 - PCV13) 08/28/2016  . DEXA SCAN  Completed  . ZOSTAVAX  Completed  . Hepatitis C Screening  Completed    Physical  Exam: Filed Vitals:   02/27/16 1009  BP: 120/80  Pulse: 50  Temp: 98 F (36.7 C)  TempSrc: Oral  Height: 4\' 10"  (1.473 m)  Weight: 154 lb (69.854 kg)  SpO2: 97%   Body mass index is 32.19 kg/(m^2). Physical Exam  Constitutional: She is oriented to person, place, and time. She appears well-developed and well-nourished. No distress.  Pulmonary/Chest: Effort normal.  Musculoskeletal: Normal range of motion. She exhibits tenderness.  Right lateral shoulder and worst with external rotation  Neurological: She is alert and oriented to person, place, and time.  Skin: Skin is warm and dry.  Psychiatric: She has a normal mood and affect.    Labs reviewed: Basic Metabolic Panel:  Recent Labs  06/02/15 1000  NA 142  K 3.9  CL 101  CO2 24  GLUCOSE 102*  BUN 17  CREATININE 1.12*  CALCIUM 9.7  TSH 3.030   Liver Function Tests:  Recent Labs  06/02/15 1000 06/22/15 0905 08/29/15 1016  AST 33 33 29  ALT 46* 52* 40*  ALKPHOS 38* 40 40  BILITOT 0.9 1.0 0.8  PROT 7.0 6.8 7.1  ALBUMIN 4.3 4.2 4.4   No results for input(s): LIPASE, AMYLASE in the last 8760 hours. No results for input(s): AMMONIA in the last 8760 hours. CBC:  Recent Labs  06/02/15 1000  WBC 6.0  NEUTROABS 2.6  HCT 43.8  MCV 97  PLT 264   Lipid Panel:  Recent Labs  06/02/15 1000 08/29/15 1016  CHOL 146 239*  HDL 41 39*  LDLCALC 65 146*  TRIG 200* 269*  CHOLHDL 3.6 6.1*   Lab Results  Component Value Date   HGBA1C 6.0* 06/02/2015    Assessment/Plan 1. Hyperlipidemia - cont low dose statin - Lipid panel; Future - Comprehensive metabolic panel; Future - Lipid panel; Future - Hepatic Function Panel; Future - Hemoglobin A1c; Future  2. Elevated liver function tests - had workup which was unrevealing so suspect fatty liver as cause - needs lab reassessment - Comprehensive metabolic panel; Future - Basic metabolic panel; Future - Hepatic Function Panel; Future  3. Right shoulder  pain -reports previously being told smething was torn in there (? Tendon) but seems like a bursitis here with increased pain if she sleeps on it -discussed that injection could help, but she says it comes and goes so she doesn't want to do anything about it  4. Primary hypertension - well controlled cont same -  CBC with Differential/Platelet; Future - Basic metabolic panel; Future  5. Chronic right hip pain -ongoing, reports using topical otc and some relief, worse with overuse (? Bursitis vs arthritis) -was preventing exercise  6. Foot pain, left -on plantar surface--?neuroma vs bone spur -she said she's going to ask her mom's podiatrist about it--needs xrays likely  7. Caregiver stress -caregiver for her 31 yo mother with dementia  -is clearly affected by this and fatigued--likely not helping with her self-care (diet/exercise)   Labs/tests ordered:   Orders Placed This Encounter  Procedures  . Lipid panel    Standing Status: Future     Number of Occurrences:      Standing Expiration Date: 03/28/2016    Order Specific Question:  Has the patient fasted?    Answer:  Yes  . Comprehensive metabolic panel    Standing Status: Future     Number of Occurrences:      Standing Expiration Date: 03/28/2016    Order Specific Question:  Has the patient fasted?    Answer:  Yes  . CBC with Differential/Platelet    Standing Status: Future     Number of Occurrences:      Standing Expiration Date: 02/26/2017  . Lipid panel    Standing Status: Future     Number of Occurrences:      Standing Expiration Date: 02/26/2017    Order Specific Question:  Has the patient fasted?    Answer:  Yes  . Basic metabolic panel    Standing Status: Future     Number of Occurrences:      Standing Expiration Date: 02/26/2017    Order Specific Question:  Has the patient fasted?    Answer:  Yes  . Hepatic Function Panel    Standing Status: Future     Number of Occurrences:      Standing Expiration Date:  02/26/2017  . Hemoglobin A1c    Standing Status: Future     Number of Occurrences:      Standing Expiration Date: 02/26/2017    Next appt:  6 mos for annual with labs fasting before; Also come Wed for fasting labs for this time (lipids, liver panel)  Brailynn Breth L. Jataya Wann, D.O. [facility] [address], [place] [address] Cell Phone (Mon-Fri 8am-5pm):  [phone] On Call:  [phone] & follow prompts after 5pm & weekends Office Phone:  [phone] Office Fax:  [phone]

## 2016-02-29 ENCOUNTER — Other Ambulatory Visit: Payer: Commercial Managed Care - HMO

## 2016-02-29 DIAGNOSIS — R945 Abnormal results of liver function studies: Secondary | ICD-10-CM

## 2016-02-29 DIAGNOSIS — E785 Hyperlipidemia, unspecified: Secondary | ICD-10-CM

## 2016-02-29 DIAGNOSIS — I10 Essential (primary) hypertension: Secondary | ICD-10-CM

## 2016-02-29 DIAGNOSIS — R7989 Other specified abnormal findings of blood chemistry: Secondary | ICD-10-CM | POA: Diagnosis not present

## 2016-03-01 LAB — CBC WITH DIFFERENTIAL/PLATELET
Basophils Absolute: 0 10*3/uL (ref 0.0–0.2)
Basos: 1 %
EOS (ABSOLUTE): 0.2 10*3/uL (ref 0.0–0.4)
Eos: 3 %
Hematocrit: 43 % (ref 34.0–46.6)
Hemoglobin: 14.6 g/dL (ref 11.1–15.9)
Immature Grans (Abs): 0 10*3/uL (ref 0.0–0.1)
Immature Granulocytes: 0 %
Lymphocytes Absolute: 2.4 10*3/uL (ref 0.7–3.1)
Lymphs: 38 %
MCH: 32.3 pg (ref 26.6–33.0)
MCHC: 34 g/dL (ref 31.5–35.7)
MCV: 95 fL (ref 79–97)
Monocytes Absolute: 0.6 10*3/uL (ref 0.1–0.9)
Monocytes: 9 %
Neutrophils Absolute: 3.2 10*3/uL (ref 1.4–7.0)
Neutrophils: 49 %
Platelets: 279 10*3/uL (ref 150–379)
RBC: 4.52 x10E6/uL (ref 3.77–5.28)
RDW: 13 % (ref 12.3–15.4)
WBC: 6.4 10*3/uL (ref 3.4–10.8)

## 2016-03-01 LAB — BASIC METABOLIC PANEL
BUN/Creatinine Ratio: 18 (ref 12–28)
BUN: 17 mg/dL (ref 8–27)
CO2: 24 mmol/L (ref 18–29)
Calcium: 9.3 mg/dL (ref 8.7–10.3)
Chloride: 99 mmol/L (ref 96–106)
Creatinine, Ser: 0.92 mg/dL (ref 0.57–1.00)
GFR calc Af Amer: 75 mL/min/{1.73_m2} (ref >59)
GFR calc non Af Amer: 65 mL/min/{1.73_m2} (ref >59)
Glucose: 98 mg/dL (ref 65–99)
Potassium: 3.9 mmol/L (ref 3.5–5.2)
Sodium: 140 mmol/L (ref 134–144)

## 2016-03-01 LAB — HEPATIC FUNCTION PANEL
ALT: 39 IU/L — ABNORMAL HIGH (ref 0–32)
AST: 30 IU/L (ref 0–40)
Albumin: 4.3 g/dL (ref 3.6–4.8)
Alkaline Phosphatase: 28 IU/L — ABNORMAL LOW (ref 39–117)
Bilirubin Total: 0.9 mg/dL (ref 0.0–1.2)
Bilirubin, Direct: 0.2 mg/dL (ref 0.00–0.40)
Total Protein: 7 g/dL (ref 6.0–8.5)

## 2016-03-01 LAB — HEMOGLOBIN A1C
Est. average glucose Bld gHb Est-mCnc: 126 mg/dL
Hgb A1c MFr Bld: 6 % — ABNORMAL HIGH (ref 4.8–5.6)

## 2016-03-01 LAB — LIPID PANEL
Chol/HDL Ratio: 3.7 ratio units (ref 0.0–4.4)
Cholesterol, Total: 146 mg/dL (ref 100–199)
HDL: 40 mg/dL (ref >39)
LDL Calculated: 61 mg/dL (ref 0–99)
Triglycerides: 227 mg/dL — ABNORMAL HIGH (ref 0–149)
VLDL Cholesterol Cal: 45 mg/dL — ABNORMAL HIGH (ref 5–40)

## 2016-03-05 ENCOUNTER — Encounter: Payer: Self-pay | Admitting: *Deleted

## 2016-06-06 ENCOUNTER — Other Ambulatory Visit: Payer: Self-pay | Admitting: Internal Medicine

## 2016-06-06 DIAGNOSIS — I10 Essential (primary) hypertension: Secondary | ICD-10-CM

## 2016-07-18 ENCOUNTER — Other Ambulatory Visit: Payer: Self-pay | Admitting: *Deleted

## 2016-07-18 MED ORDER — METOPROLOL TARTRATE 50 MG PO TABS: ORAL_TABLET | ORAL | 3 refills | Status: DC

## 2016-07-18 NOTE — Telephone Encounter (Signed)
Patient requested Rx to be sent to [facility]

## 2016-07-27 ENCOUNTER — Other Ambulatory Visit: Payer: Self-pay | Admitting: Internal Medicine

## 2016-07-27 DIAGNOSIS — K219 Gastro-esophageal reflux disease without esophagitis: Secondary | ICD-10-CM

## 2016-08-13 ENCOUNTER — Other Ambulatory Visit: Payer: Self-pay | Admitting: Obstetrics & Gynecology

## 2016-08-13 DIAGNOSIS — Z6831 Body mass index (BMI) 31.0-31.9, adult: Secondary | ICD-10-CM | POA: Diagnosis not present

## 2016-08-13 DIAGNOSIS — Z1231 Encounter for screening mammogram for malignant neoplasm of breast: Secondary | ICD-10-CM | POA: Diagnosis not present

## 2016-08-13 DIAGNOSIS — Z01419 Encounter for gynecological examination (general) (routine) without abnormal findings: Secondary | ICD-10-CM | POA: Diagnosis not present

## 2016-08-13 DIAGNOSIS — Z124 Encounter for screening for malignant neoplasm of cervix: Secondary | ICD-10-CM | POA: Diagnosis not present

## 2016-08-13 DIAGNOSIS — D259 Leiomyoma of uterus, unspecified: Secondary | ICD-10-CM | POA: Diagnosis not present

## 2016-08-13 DIAGNOSIS — D279 Benign neoplasm of unspecified ovary: Secondary | ICD-10-CM | POA: Diagnosis not present

## 2016-08-13 LAB — HM PAP SMEAR: HM PAP: NORMAL

## 2016-08-13 LAB — HM MAMMOGRAPHY: HM Mammogram: NORMAL (ref 0–4)

## 2016-08-14 LAB — CYTOLOGY - PAP

## 2016-08-27 ENCOUNTER — Other Ambulatory Visit: Payer: Self-pay | Admitting: Internal Medicine

## 2016-08-27 DIAGNOSIS — R945 Abnormal results of liver function studies: Secondary | ICD-10-CM

## 2016-08-27 DIAGNOSIS — R7989 Other specified abnormal findings of blood chemistry: Secondary | ICD-10-CM

## 2016-08-27 DIAGNOSIS — E785 Hyperlipidemia, unspecified: Secondary | ICD-10-CM

## 2016-08-28 ENCOUNTER — Other Ambulatory Visit: Payer: Commercial Managed Care - HMO

## 2016-08-28 DIAGNOSIS — R945 Abnormal results of liver function studies: Secondary | ICD-10-CM

## 2016-08-28 DIAGNOSIS — E785 Hyperlipidemia, unspecified: Secondary | ICD-10-CM

## 2016-08-28 DIAGNOSIS — R7989 Other specified abnormal findings of blood chemistry: Secondary | ICD-10-CM

## 2016-08-28 LAB — HEPATIC FUNCTION PANEL
ALT: 32 U/L — ABNORMAL HIGH (ref 6–29)
AST: 28 U/L (ref 10–35)
Albumin: 4.3 g/dL (ref 3.6–5.1)
Alkaline Phosphatase: 20 U/L — ABNORMAL LOW (ref 33–130)
Bilirubin, Direct: 0.2 mg/dL (ref <=0.2)
Indirect Bilirubin: 0.7 mg/dL (ref 0.2–1.2)
Total Bilirubin: 0.9 mg/dL (ref 0.2–1.2)
Total Protein: 6.9 g/dL (ref 6.1–8.1)

## 2016-08-28 LAB — LIPID PANEL
Cholesterol: 129 mg/dL (ref <200)
HDL: 34 mg/dL — ABNORMAL LOW (ref >50)
LDL Cholesterol: 54 mg/dL (ref <100)
Total CHOL/HDL Ratio: 3.8 Ratio (ref <5.0)
Triglycerides: 206 mg/dL — ABNORMAL HIGH (ref <150)
VLDL: 41 mg/dL — ABNORMAL HIGH (ref <30)

## 2016-08-30 ENCOUNTER — Ambulatory Visit (INDEPENDENT_AMBULATORY_CARE_PROVIDER_SITE_OTHER): Payer: Commercial Managed Care - HMO | Admitting: Internal Medicine

## 2016-08-30 ENCOUNTER — Encounter: Payer: Self-pay | Admitting: Internal Medicine

## 2016-08-30 VITALS — BP 118/70 | HR 58 | Temp 98.1°F | Wt 151.0 lb

## 2016-08-30 DIAGNOSIS — E785 Hyperlipidemia, unspecified: Secondary | ICD-10-CM | POA: Diagnosis not present

## 2016-08-30 DIAGNOSIS — K219 Gastro-esophageal reflux disease without esophagitis: Secondary | ICD-10-CM

## 2016-08-30 DIAGNOSIS — I10 Essential (primary) hypertension: Secondary | ICD-10-CM

## 2016-08-30 DIAGNOSIS — Z0000 Encounter for general adult medical examination without abnormal findings: Secondary | ICD-10-CM | POA: Diagnosis not present

## 2016-08-30 DIAGNOSIS — M25562 Pain in left knee: Secondary | ICD-10-CM

## 2016-08-30 DIAGNOSIS — Z800 Family history of malignant neoplasm of digestive organs: Secondary | ICD-10-CM

## 2016-08-30 DIAGNOSIS — M25511 Pain in right shoulder: Secondary | ICD-10-CM

## 2016-08-30 DIAGNOSIS — D259 Leiomyoma of uterus, unspecified: Secondary | ICD-10-CM

## 2016-08-30 DIAGNOSIS — G8929 Other chronic pain: Secondary | ICD-10-CM | POA: Diagnosis not present

## 2016-08-30 MED ORDER — SIMVASTATIN 20 MG PO TABS: 20.0000 mg | ORAL_TABLET | Freq: Every day | ORAL | 3 refills | Status: DC

## 2016-08-30 MED ORDER — LOSARTAN POTASSIUM-HCTZ 100-12.5 MG PO TABS: 1.0000 | ORAL_TABLET | Freq: Every day | ORAL | 3 refills | Status: DC

## 2016-08-30 MED ORDER — ALENDRONATE SODIUM 70 MG PO TABS: 70.0000 mg | ORAL_TABLET | ORAL | 3 refills | Status: DC

## 2016-08-30 NOTE — Progress Notes (Signed)
Provider:  Rexene Edison. Mariea Clonts, D.O., C.M.D. Location:   [facility]   Place of Service:   office  Previous PCP: Hollace Kinnier, DO Patient Care Team: Gayland Curry, DO as PCP - General (Geriatric Medicine)  Extended Emergency Contact Information Primary Emergency Contact: Klingler,[name] Address: [address]          [place], [place] [address] [place] of [place] Phone: [phone] Mobile Phone: [phone] Relation: Spouse  Code Status: full code Goals of Care: Advanced Directive information Advanced Directives 08/29/2015  Does Patient Have a Medical Advance Directive? No  Would patient like information on creating a medical advance directive? Yes - Geneticist, molecular Complaint  Patient presents with  . Annual Exam    HPI: Patient is a 65 y.o. female seen today for an annual physical exam.  She's had her mammogram and pap smear at gyn.  She is overdue for her cscope--she had polyps and diverticulosis and was to f/u in 5 years with Dr. Olevia Perches, but did not.  She does not want it.  She has seen cologuard on tv but with family history of colon ca and prior polyps, she is not a low risk pt.  I think I convinced her to get the cscope done.    She is up to date on vaccines.  She is doing a weight loss program with [employer] and had lost 5 lbs (gained 2 back with holidays).   She c/o right shoulder pain today that comes and goes--mostly in the posterior shoulder.  Primarily when she lays on it.    Left knee is getting worse lately.  She thinks her initial pain was in her right knee. It's located medially and worsens after prolonged walking outside, but does ok on treadmill.   Past Medical History:  Diagnosis Date  . GERD (gastroesophageal reflux disease)   . Hyperlipidemia   . Hypertension    Past Surgical History:  Procedure Laterality Date  . BIOPSY THYROID    . TONSILECTOMY, ADENOIDECTOMY, BILATERAL MYRINGOTOMY AND TUBES Bilateral 1957    reports that she has never smoked. She has never used smokeless tobacco. She reports that she does not drink alcohol or use drugs.  Functional Status Survey: Is the patient deaf or have difficulty hearing?: No Does the patient have difficulty seeing, even when wearing glasses/contacts?: No Does the patient have difficulty concentrating, remembering, or making decisions?: No Does the patient have difficulty walking or climbing stairs?: No Does the patient have difficulty dressing or bathing?: No Does the patient have difficulty doing errands alone such as visiting a doctor's office or shopping?: No  AWV performed also with flowsheet and nurse assessment.    Family History  Problem Relation Age of Onset  . Hypertension Father   . Diabetes Father   . Coronary artery disease Father   . Hyperlipidemia Father   . Colon cancer Father   . Hypertension Mother   . Arthritis Mother     Health Maintenance  Topic Date Due  . COLONOSCOPY  10/16/2015  . MAMMOGRAM  08/13/2018  . TETANUS/TDAP  06/23/2022  . INFLUENZA VACCINE  Completed  . DEXA SCAN  Completed  . ZOSTAVAX  Completed  . Hepatitis C Screening  Completed  . PNA vac Low Risk Adult  Completed    No Known Allergies  Allergies as of 08/30/2016   No Known Allergies     Medication List       Accurate as of 08/30/16  2:57 PM. Always use your most recent med list.          alendronate 70 MG tablet Commonly known as:  FOSAMAX Take 1 tablet (70 mg total) by mouth once a week.   b complex vitamins tablet Take 1 tablet by mouth daily.   Black Cohosh 40 MG Caps Take 40 mg by mouth daily.   CALCIUM 600 + MINERALS PO Take by mouth 2 (two) times daily.   losartan-hydrochlorothiazide 100-12.5 MG tablet Commonly known as:  HYZAAR Take 1 tablet by mouth daily.   metoprolol 50 MG tablet Commonly known as:  LOPRESSOR Take one tablet by mouth twice daily   multivitamin tablet Take 2 tablets by mouth daily.   omeprazole  20 MG capsule Commonly known as:  PRILOSEC TAKE 1 CAPSULE EVERY DAY   OVER THE COUNTER MEDICATION daily. Replenex - chondroitin, MSM, glucosamine, ginger, green tea   OVER THE COUNTER MEDICATION daily. Phytomega - phytosterol esters 1000mg /fishoil 500mg /C&E   PROBIOTIC DAILY PO Take by mouth daily.   simvastatin 20 MG tablet Commonly known as:  ZOCOR Take 1 tablet (20 mg total) by mouth daily.   vitamin C 500 MG tablet Commonly known as:  ASCORBIC ACID Take 500 mg by mouth daily.       Review of Systems  Constitutional: Negative for chills and fever.  HENT: Negative for hearing loss.   Eyes: Negative for blurred vision.  Respiratory: Negative for shortness of breath.   Cardiovascular: Negative for chest pain and palpitations.  Gastrointestinal: Positive for heartburn. Negative for abdominal pain, blood in stool, constipation, diarrhea and melena.       Better with burping and taking antacid occasionally  Genitourinary: Negative for dysuria.  Musculoskeletal: Positive for joint pain. Negative for falls.       Knee, right hand ongoing and may be worsening  Neurological: Negative for dizziness, loss of consciousness and weakness.  Psychiatric/Behavioral: The patient has insomnia.        Wakes up in the middle of the night at times    Vitals:   08/30/16 1355  BP: 118/70  Pulse: (!) 58  Temp: 98.1 F (36.7 C)  TempSrc: Oral  SpO2: 94%  Weight: 151 lb (68.5 kg)   Body mass index is 31.56 kg/m. Physical Exam  Constitutional: She is oriented to person, place, and time. She appears well-developed and well-nourished. No distress.  HENT:  Head: Normocephalic and atraumatic.  Right Ear: External ear normal.  Left Ear: External ear normal.  Nose: Nose normal.  Mouth/Throat: Oropharynx is clear and moist. No oropharyngeal exudate.  Eyes: Conjunctivae and EOM are normal. Pupils are equal, round, and reactive to light.  Neck: Normal range of motion. Neck supple. No JVD  present.  Cardiovascular: Normal rate, regular rhythm, normal heart sounds and intact distal pulses.   Pulmonary/Chest: Effort normal and breath sounds normal. No respiratory distress.  Abdominal: Soft. Bowel sounds are normal. She exhibits no distension. There is no tenderness.  Musculoskeletal: Normal range of motion. She exhibits tenderness.  Tenderness right posterior shoulder, left medial knee with some crepitus  Lymphadenopathy:    She has no cervical adenopathy.  Neurological: She is alert and oriented to person, place, and time. She displays normal reflexes. No cranial nerve deficit. Coordination normal.  Skin: Skin is warm and dry. Capillary refill takes less than 2 seconds.  Psychiatric: She has a normal mood and affect. Her behavior is normal. Judgment and thought content normal.   Labs reviewed: Basic Metabolic  Panel:  Recent Labs  02/29/16 1027  NA 140  K 3.9  CL 99  CO2 24  GLUCOSE 98  BUN 17  CREATININE 0.92  CALCIUM 9.3   Liver Function Tests:  Recent Labs  02/29/16 1027 08/28/16 1001  AST 30 28  ALT 39* 32*  ALKPHOS 28* 20*  BILITOT 0.9 0.9  PROT 7.0 6.9  ALBUMIN 4.3 4.3   No results for input(s): LIPASE, AMYLASE in the last 8760 hours. No results for input(s): AMMONIA in the last 8760 hours. CBC:  Recent Labs  02/29/16 1027  WBC 6.4  NEUTROABS 3.2  HCT 43.0  MCV 95  PLT 279   Cardiac Enzymes: No results for input(s): CKTOTAL, CKMB, CKMBINDEX, TROPONINI in the last 8760 hours. BNP: Invalid input(s): POCBNP Lab Results  Component Value Date   HGBA1C 6.0 (H) 02/29/2016   Lab Results  Component Value Date   TSH 3.030 06/02/2015   No results found for: VITAMINB12 No results found for: FOLATE No results found for: IRON, TIBC, FERRITIN  Imaging and Procedures Recently: EKG today:  NSR at 57bpm--unchanged from prior EKG  Assessment/Plan 1. Essential hypertension, benign - bp at goal with current regimen, cont same and work on weight  with walking and dietary changes, low sodium diet - losartan-hydrochlorothiazide (HYZAAR) 100-12.5 MG tablet; Take 1 tablet by mouth daily.  Dispense: 90 tablet; Refill: 3 - EKG 12-Lead - COMPLETE METABOLIC PANEL WITH GFR; Future  2. Gastroesophageal reflux disease, esophagitis presence not specified -cont omeprazole and prn antacid when has rare indigestion   3. Family history of colon cancer - and prior colon polyps--reviewing Dr. Nichola Sizer note, it actually wanted her back in 5 years from 2007 but she says she was told 10 years and that was what he had down (based on her verbal apparently) - Ambulatory referral to Gastroenterology for cscope  4. Medicare annual wellness visit, subsequent -completed today, up to date on all but cscope  5. Uterine leiomyoma, unspecified location -follows with gynecology annually and has ultrasounds done  6. Hyperlipidemia, unspecified hyperlipidemia type -lipids improving--now has LDL at goal, but TG still elevated--cont diet and exercise and zocor - Lipid panel; Future  7. Chronic right shoulder pain -arthritic and may have some scar tissue due to her prior rotator cuff tear (MRI too old and not in her chart), advise tylenol and continue regular exercise  8. Chronic pain of left knee -cont exercise for weight loss and dietary changes  Labs/tests ordered:   Orders Placed This Encounter  Procedures  . HM MAMMOGRAPHY    This external order was created through the Results Console.  Marland Kitchen [facility] PAP SMEAR    This external order was created through the Results Console.  . Lipid panel    Standing Status:   Future    Standing Expiration Date:   08/30/2017    Order Specific Question:   Has the patient fasted?    Answer:   Yes  . COMPLETE METABOLIC PANEL WITH GFR    [facility]    Standing Status:   Future    Standing Expiration Date:   08/30/2017  . Ambulatory referral to Gastroenterology    Referral Priority:   Routine    Referral Type:   Consultation      Referral Reason:   Specialty Services Required    Number of Visits Requested:   1  . EKG 12-Lead    Nadine Ryle L. Siyon Linck, D.O. [facility] [phone]  [place], [place] [address] Cell Phone (Mon-Fri 8am-5pm):  [phone] On Call:  [phone] & follow prompts after 5pm & weekends Office Phone:  [phone] Office Fax:  [phone]

## 2016-09-04 ENCOUNTER — Encounter: Payer: Self-pay | Admitting: Gastroenterology

## 2016-10-23 ENCOUNTER — Ambulatory Visit (AMBULATORY_SURGERY_CENTER): Payer: Self-pay

## 2016-10-23 ENCOUNTER — Encounter: Payer: Self-pay | Admitting: Gastroenterology

## 2016-10-23 VITALS — Ht <= 58 in | Wt 152.2 lb

## 2016-10-23 DIAGNOSIS — Z800 Family history of malignant neoplasm of digestive organs: Secondary | ICD-10-CM

## 2016-10-23 MED ORDER — NA SULFATE-K SULFATE-MG SULF 17.5-3.13-1.6 GM/177ML PO SOLN: ORAL | 0 refills | Status: DC

## 2016-10-23 NOTE — Progress Notes (Signed)
Per pt, no allergies to soy or egg products.Pt not taking any weight loss meds or using  O2 at home. 

## 2016-11-06 ENCOUNTER — Ambulatory Visit (AMBULATORY_SURGERY_CENTER): Payer: Medicare HMO | Admitting: Gastroenterology

## 2016-11-06 ENCOUNTER — Encounter: Payer: Self-pay | Admitting: Gastroenterology

## 2016-11-06 VITALS — BP 124/69 | HR 54 | Temp 96.6°F | Resp 14 | Ht <= 58 in | Wt 152.0 lb

## 2016-11-06 DIAGNOSIS — Z1212 Encounter for screening for malignant neoplasm of rectum: Secondary | ICD-10-CM

## 2016-11-06 DIAGNOSIS — Z800 Family history of malignant neoplasm of digestive organs: Secondary | ICD-10-CM | POA: Diagnosis present

## 2016-11-06 DIAGNOSIS — K219 Gastro-esophageal reflux disease without esophagitis: Secondary | ICD-10-CM | POA: Diagnosis not present

## 2016-11-06 DIAGNOSIS — Z1211 Encounter for screening for malignant neoplasm of colon: Secondary | ICD-10-CM | POA: Diagnosis not present

## 2016-11-06 DIAGNOSIS — I10 Essential (primary) hypertension: Secondary | ICD-10-CM | POA: Diagnosis not present

## 2016-11-06 MED ORDER — SODIUM CHLORIDE 0.9 % IV SOLN: 500.0000 mL | INTRAVENOUS | Status: DC

## 2016-11-06 NOTE — Patient Instructions (Signed)
YOU HAD AN ENDOSCOPIC PROCEDURE TODAY AT [facility]:   Refer to the procedure report that was given to you for any specific questions about what was found during the examination.  If the procedure report does not answer your questions, please call your gastroenterologist to clarify.  If you requested that your care partner not be given the details of your procedure findings, then the procedure report has been included in a sealed envelope for you to review at your convenience later.  YOU SHOULD EXPECT: Some feelings of bloating in the abdomen. Passage of more gas than usual.  Walking can help get rid of the air that was put into your GI tract during the procedure and reduce the bloating. If you had a lower endoscopy (such as a colonoscopy or flexible sigmoidoscopy) you may notice spotting of blood in your stool or on the toilet paper. If you underwent a bowel prep for your procedure, you may not have a normal bowel movement for a few days.  Please Note:  You might notice some irritation and congestion in your nose or some drainage.  This is from the oxygen used during your procedure.  There is no need for concern and it should clear up in a day or so.  SYMPTOMS TO REPORT IMMEDIATELY:   Following lower endoscopy (colonoscopy or flexible sigmoidoscopy):  Excessive amounts of blood in the stool  Significant tenderness or worsening of abdominal pains  Swelling of the abdomen that is new, acute  Fever of 100F or higher   For urgent or emergent issues, a gastroenterologist can be reached at any hour by calling [phone].   DIET:  We do recommend a small meal at first, but then you may proceed to your regular diet.  Drink plenty of fluids but you should avoid alcoholic beverages for 24 hours. Try to increase the fiber in your diet, and drink plenty of water.  ACTIVITY:  You should plan to take it easy for the rest of today and you should NOT DRIVE or use heavy machinery until  tomorrow (because of the sedation medicines used during the test).    FOLLOW UP: Our staff will call the number listed on your records the next business day following your procedure to check on you and address any questions or concerns that you may have regarding the information given to you following your procedure. If we do not reach you, we will leave a message.  However, if you are feeling well and you are not experiencing any problems, there is no need to return our call.  We will assume that you have returned to your regular daily activities without incident.  SIGNATURES/CONFIDENTIALITY: You and/or your care partner have signed paperwork which will be entered into your electronic medical record.  These signatures attest to the fact that that the information above on your After Visit Summary has been reviewed and is understood.  Full responsibility of the confidentiality of this discharge information lies with you and/or your care-partner.  Read all of the information given to you by your recovery room nurse.

## 2016-11-06 NOTE — Op Note (Signed)
[facility] Patient Name: Melissa Simpson Procedure Date: 11/06/2016 10:48 AM MRN: [id number] Endoscopist: Mauri Pole , MD Age: 66 Referring MD:  Date of Birth: [birth year] Gender: Female Account #: [id number] Procedure:                Colonoscopy Indications:              Screening patient at increased risk: Family history                            of 1st-degree relative with colorectal cancer at                            age 31 years (or older) (Father diagnosed with                            colon cancer in his 63's) Medicines:                Monitored Anesthesia Care Procedure:                Pre-Anesthesia Assessment:                           - Prior to the procedure, a History and Physical                            was performed, and patient medications and                            allergies were reviewed. The patient's tolerance of                            previous anesthesia was also reviewed. The risks                            and benefits of the procedure and the sedation                            options and risks were discussed with the patient.                            All questions were answered, and informed consent                            was obtained. Prior Anticoagulants: The patient has                            taken no previous anticoagulant or antiplatelet                            agents. ASA Grade Assessment: II - A patient with                            mild systemic disease. After reviewing the risks  and benefits, the patient was deemed in                            satisfactory condition to undergo the procedure.                           After obtaining informed consent, the colonoscope                            was passed under direct vision. Throughout the                            procedure, the patient's blood pressure, pulse, and                            oxygen saturations were monitored  continuously. The                            Model CF-HQ190L [id number]) scope was introduced                            through the anus and advanced to the the terminal                            ileum, with identification of the appendiceal                            orifice and IC valve. The colonoscopy was performed                            without difficulty. The patient tolerated the                            procedure well. The quality of the bowel                            preparation was excellent. The terminal ileum,                            ileocecal valve, appendiceal orifice, and rectum                            were photographed. Scope In: 11:05:41 AM Scope Out: 11:15:51 AM Scope Withdrawal Time: 0 hours 7 minutes 44 seconds  Total Procedure Duration: 0 hours 10 minutes 10 seconds  Findings:                 The perianal and digital rectal examinations were                            normal.                           Multiple small-mouthed diverticula were found in  the sigmoid colon and descending colon.                           Non-bleeding internal hemorrhoids were found during                            retroflexion. The hemorrhoids were small.                           The exam was otherwise without abnormality. Complications:            No immediate complications. Estimated Blood Loss:     Estimated blood loss: none. Impression:               - Diverticulosis in the sigmoid colon and in the                            descending colon.                           - Non-bleeding internal hemorrhoids.                           - The examination was otherwise normal.                           - No specimens collected. Recommendation:           - Patient has a contact number available for                            emergencies. The signs and symptoms of potential                            delayed complications were discussed with the                             patient. Return to normal activities tomorrow.                            Written discharge instructions were provided to the                            patient.                           - Resume previous diet.                           - Continue present medications.                           - Repeat colonoscopy in 10 years for screening                            purposes.                           -  Return to GI clinic PRN. Mauri Pole, MD 11/06/2016 11:20:56 AM This report has been signed electronically.

## 2016-11-06 NOTE — Progress Notes (Signed)
Report to PACU, RN, vss, BBS= Clear.  

## 2016-11-07 ENCOUNTER — Telehealth: Payer: Self-pay | Admitting: *Deleted

## 2016-11-07 NOTE — Telephone Encounter (Signed)
  Follow up Call-  Call back number 11/06/2016  Post procedure Call Back phone  # [phone]  Permission to leave phone message Yes  Some recent data might be hidden     Patient questions:  Do you have a fever, pain , or abdominal swelling? No. Pain Score  0 *  Have you tolerated food without any problems? Yes.    Have you been able to return to your normal activities? Yes.    Do you have any questions about your discharge instructions: Diet   No. Medications  No. Follow up visit  No.  Do you have questions or concerns about your Care? No.  Actions: * If pain score is 4 or above: No action needed, pain <4.

## 2017-01-03 ENCOUNTER — Other Ambulatory Visit: Payer: Self-pay

## 2017-01-03 DIAGNOSIS — E785 Hyperlipidemia, unspecified: Secondary | ICD-10-CM

## 2017-01-03 DIAGNOSIS — I10 Essential (primary) hypertension: Secondary | ICD-10-CM

## 2017-02-26 ENCOUNTER — Other Ambulatory Visit: Payer: Medicare HMO

## 2017-02-26 DIAGNOSIS — I10 Essential (primary) hypertension: Secondary | ICD-10-CM | POA: Diagnosis not present

## 2017-02-26 DIAGNOSIS — E785 Hyperlipidemia, unspecified: Secondary | ICD-10-CM

## 2017-02-26 LAB — LIPID PANEL
Cholesterol: 157 mg/dL (ref <200)
HDL: 42 mg/dL — ABNORMAL LOW (ref >50)
LDL Cholesterol: 64 mg/dL (ref <100)
Total CHOL/HDL Ratio: 3.7 Ratio (ref <5.0)
Triglycerides: 255 mg/dL — ABNORMAL HIGH (ref <150)
VLDL: 51 mg/dL — ABNORMAL HIGH (ref <30)

## 2017-02-26 LAB — COMPLETE METABOLIC PANEL WITH GFR
ALT: 27 U/L (ref 6–29)
AST: 36 U/L — ABNORMAL HIGH (ref 10–35)
Albumin: 4.3 g/dL (ref 3.6–5.1)
Alkaline Phosphatase: 23 U/L — ABNORMAL LOW (ref 33–130)
BUN: 18 mg/dL (ref 7–25)
CO2: 20 mmol/L (ref 20–31)
Calcium: 9.1 mg/dL (ref 8.6–10.4)
Chloride: 106 mmol/L (ref 98–110)
Creat: 0.91 mg/dL (ref 0.50–0.99)
GFR, Est African American: 75 mL/min (ref >=60)
GFR, Est Non African American: 65 mL/min (ref >=60)
Glucose, Bld: 94 mg/dL (ref 65–99)
Potassium: 4.1 mmol/L (ref 3.5–5.3)
Sodium: 138 mmol/L (ref 135–146)
Total Bilirubin: 0.9 mg/dL (ref 0.2–1.2)
Total Protein: 7.1 g/dL (ref 6.1–8.1)

## 2017-02-28 ENCOUNTER — Encounter: Payer: Self-pay | Admitting: Internal Medicine

## 2017-02-28 ENCOUNTER — Ambulatory Visit (INDEPENDENT_AMBULATORY_CARE_PROVIDER_SITE_OTHER): Payer: Medicare HMO | Admitting: Internal Medicine

## 2017-02-28 VITALS — BP 118/80 | HR 51 | Temp 98.6°F | Wt 153.0 lb

## 2017-02-28 DIAGNOSIS — M25562 Pain in left knee: Secondary | ICD-10-CM | POA: Insufficient documentation

## 2017-02-28 DIAGNOSIS — M25511 Pain in right shoulder: Secondary | ICD-10-CM

## 2017-02-28 DIAGNOSIS — I10 Essential (primary) hypertension: Secondary | ICD-10-CM | POA: Insufficient documentation

## 2017-02-28 DIAGNOSIS — E785 Hyperlipidemia, unspecified: Secondary | ICD-10-CM | POA: Diagnosis not present

## 2017-02-28 DIAGNOSIS — R739 Hyperglycemia, unspecified: Secondary | ICD-10-CM

## 2017-02-28 DIAGNOSIS — K219 Gastro-esophageal reflux disease without esophagitis: Secondary | ICD-10-CM | POA: Diagnosis not present

## 2017-02-28 DIAGNOSIS — G8929 Other chronic pain: Secondary | ICD-10-CM | POA: Diagnosis not present

## 2017-02-28 NOTE — Progress Notes (Signed)
Location:  [facility] clinic Provider:  Brave Dack L. Mariea Clonts, D.O., C.M.D.  Code Status: full code Goals of Care:  Advanced Directives 02/28/2017  Does Patient Have a Medical Advance Directive? No  Would patient like information on creating a medical advance directive? No - Patient declined   Chief Complaint  Patient presents with  . Medical Management of Chronic Issues    72mth follow-up    HPI: Patient is a 66 y.o. female seen today for medical management of chronic diseases.    Left knee is hurting a lot more and her right should is hurting a lot more.  Puts some otc topical on left knee.   Right arm hurts in the back like playing on her tablet.  Had an MRI on the arm with something torn.  That was several years ago (4-5 at least).    Not doing much exercise.  Even walking across [employer] hurts her right hip or back and she has to slow down.  Says her husband gets a shot in his knee with benefit.    C-scope was normal (no polyps)--next due in 10 years.  HTN:  Taking bp meds w/o side effects.    Hyperlipidemia:  Cholesterol trending up despite zocor.  Not dieting or exercising.  Has pain limiting exercise.  Past Medical History:  Diagnosis Date  . GERD (gastroesophageal reflux disease)   . Hyperlipidemia   . Hypertension     Past Surgical History:  Procedure Laterality Date  . BIOPSY THYROID  2008  . TONSILECTOMY, ADENOIDECTOMY, BILATERAL MYRINGOTOMY AND TUBES Bilateral 1957    No Known Allergies  Allergies as of 02/28/2017   No Known Allergies     Medication List       Accurate as of 02/28/17 11:37 AM. Always use your most recent med list.          alendronate 70 MG tablet Commonly known as:  FOSAMAX Take 1 tablet (70 mg total) by mouth once a week.   b complex vitamins tablet Take 1 tablet by mouth daily.   Black Cohosh 40 MG Caps Take 40 mg by mouth daily.   CALCIUM 600 + MINERALS PO Take by mouth 2 (two) times daily.   losartan-hydrochlorothiazide 100-12.5  MG tablet Commonly known as:  HYZAAR Take 1 tablet by mouth daily.   metoprolol tartrate 50 MG tablet Commonly known as:  LOPRESSOR Take one tablet by mouth twice daily   multivitamin tablet Take 1 tablet by mouth 2 (two) times daily.   omeprazole 20 MG capsule Commonly known as:  PRILOSEC TAKE 1 CAPSULE EVERY DAY   OVER THE COUNTER MEDICATION Replenex - chondroitin, MSM, glucosamine, ginger, green tea -Take 2 pills in am and 1 pill at night   OVER THE COUNTER MEDICATION daily. Phytomega - phytosterol esters 1000mg /fishoil 500mg /C&E -Take one pill twice a day   PROBIOTIC DAILY PO Take by mouth daily.   simvastatin 20 MG tablet Commonly known as:  ZOCOR Take 1 tablet (20 mg total) by mouth daily.   vitamin C 500 MG tablet Commonly known as:  ASCORBIC ACID Take 500 mg by mouth daily.       Review of Systems:  Review of Systems  Constitutional: Negative for chills, fever and malaise/fatigue.  HENT: Negative for congestion and hearing loss.   Eyes: Negative for blurred vision.       Glasses  Respiratory: Negative for cough and shortness of breath.   Cardiovascular: Negative for chest pain, palpitations and leg swelling.  Gastrointestinal: Negative for abdominal pain, blood in stool, constipation and melena.  Genitourinary: Negative for dysuria.  Musculoskeletal: Positive for back pain, joint pain and myalgias. Negative for falls and neck pain.  Skin: Negative for itching and rash.  Neurological: Negative for dizziness, loss of consciousness and weakness.  Psychiatric/Behavioral: Negative for depression and memory loss.    Health Maintenance  Topic Date Due  . INFLUENZA VACCINE  04/10/2017  . MAMMOGRAM  08/13/2018  . TETANUS/TDAP  06/23/2022  . COLONOSCOPY  11/06/2026  . DEXA SCAN  Completed  . Hepatitis C Screening  Completed  . PNA vac Low Risk Adult  Completed    Physical Exam: Vitals:   02/28/17 1127  BP: 118/80  Pulse: (!) 51  Temp: 98.6 F (37 C)    TempSrc: Oral  SpO2: 96%  Weight: 153 lb (69.4 kg)   Body mass index is 31.98 kg/m. Physical Exam  Constitutional: She is oriented to person, place, and time. She appears well-developed and well-nourished. No distress.  Cardiovascular: Normal rate, regular rhythm, normal heart sounds and intact distal pulses.   Pulmonary/Chest: Effort normal and breath sounds normal. No respiratory distress.  Musculoskeletal: She exhibits tenderness.  Tenderness down the back of her right arm worse with abduction and internal and external rotation; tenderness of left medial knee with swelling  Neurological: She is alert and oriented to person, place, and time.  Skin: Skin is warm and dry.  Psychiatric: She has a normal mood and affect.    Labs reviewed: Basic Metabolic Panel:  Recent Labs  02/26/17 1007  NA 138  K 4.1  CL 106  CO2 20  GLUCOSE 94  BUN 18  CREATININE 0.91  CALCIUM 9.1   Liver Function Tests:  Recent Labs  08/28/16 1001 02/26/17 1007  AST 28 36*  ALT 32* 27  ALKPHOS 20* 23*  BILITOT 0.9 0.9  PROT 6.9 7.1  ALBUMIN 4.3 4.3   No results for input(s): LIPASE, AMYLASE in the last 8760 hours. No results for input(s): AMMONIA in the last 8760 hours. CBC: No results for input(s): WBC, NEUTROABS, HGB, HCT, MCV, PLT in the last 8760 hours. Lipid Panel:  Recent Labs  08/28/16 1001 02/26/17 1007  CHOL 129 157  HDL 34* 42*  LDLCALC 54 64  TRIG 206* 255*  CHOLHDL 3.8 3.7   Lab Results  Component Value Date   HGBA1C 6.0 (H) 02/29/2016    Assessment/Plan 1. Left medial knee pain - DG Knee Complete 4 Views Left; Future -has inflammation visible -plan on ortho referral for knee injection--she seems to know of someone giving a "painless" knee injection at [place]  2. Essential hypertension, benign -bp at goal with current therapy  3. Gastroesophageal reflux disease, esophagitis presence not specified -cont dietary recommendations, prilosec  4.  Hyperlipidemia, unspecified hyperlipidemia type -lipids trending up despite zocor 20mg  due to poor diet and no exercise -if trend continues, increase zocor next time  5. Chronic right shoulder pain -says she had a tear in her rotator cuff but I don't have those records -will plan on ortho referral after xrays done  Labs/tests ordered:   Orders Placed This Encounter  Procedures  . DG Knee Complete 4 Views Left    Standing Status:   Future    Standing Expiration Date:   05/01/2018    Order Specific Question:   Reason for Exam (SYMPTOM  OR DIAGNOSIS REQUIRED)    Answer:   left medial knee pain and swelling  Order Specific Question:   Preferred imaging location?    Answer:   [facility]    Order Specific Question:   Call Results- Best Contact Number?    Answer:   [phone]    Next appt:  6 mos for CPE with labs before  Vitaly Wanat L. Richetta Cubillos, D.O. [facility] [address], [place] [address] Cell Phone (Mon-Fri 8am-5pm):  [phone] On Call:  [phone] & follow prompts after 5pm & weekends Office Phone:  [phone] Office Fax:  [phone]

## 2017-03-20 ENCOUNTER — Telehealth: Payer: Self-pay | Admitting: Internal Medicine

## 2017-03-20 DIAGNOSIS — M25511 Pain in right shoulder: Secondary | ICD-10-CM

## 2017-03-20 NOTE — Telephone Encounter (Signed)
Order place.  Pt can walk into [facility] for these.  Please let her know that she can go for both the knee and shoulder now.

## 2017-03-20 NOTE — Telephone Encounter (Signed)
Patient called the office this afternoon requesting an MRI of her arm, she said she is supposed to go have (what she thought was an MRI on her knee, but I explained to her that the order was for an X-Ray) she would like to have an X-Ray added for her right arm as she stated it hurts.

## 2017-03-20 NOTE — Telephone Encounter (Signed)
Called and spoke with patient to let her know the additional X-Ray was ordered and she could walk in to [place] at her conveniance

## 2017-03-26 ENCOUNTER — Ambulatory Visit
Admission: RE | Admit: 2017-03-26 | Discharge: 2017-03-26 | Disposition: A | Discharge status: Home / Self Care | Payer: Medicare HMO | Source: Ambulatory Visit | Attending: Internal Medicine | Admitting: Internal Medicine

## 2017-03-26 DIAGNOSIS — M19011 Primary osteoarthritis, right shoulder: Secondary | ICD-10-CM | POA: Diagnosis not present

## 2017-03-26 DIAGNOSIS — M1712 Unilateral primary osteoarthritis, left knee: Secondary | ICD-10-CM | POA: Diagnosis not present

## 2017-03-26 DIAGNOSIS — M25562 Pain in left knee: Secondary | ICD-10-CM

## 2017-03-26 DIAGNOSIS — M25511 Pain in right shoulder: Secondary | ICD-10-CM

## 2017-03-28 ENCOUNTER — Other Ambulatory Visit: Payer: Self-pay | Admitting: Internal Medicine

## 2017-03-28 DIAGNOSIS — M19011 Primary osteoarthritis, right shoulder: Secondary | ICD-10-CM

## 2017-03-28 DIAGNOSIS — M1712 Unilateral primary osteoarthritis, left knee: Secondary | ICD-10-CM

## 2017-04-10 ENCOUNTER — Encounter (INDEPENDENT_AMBULATORY_CARE_PROVIDER_SITE_OTHER): Payer: Self-pay | Admitting: Orthopedic Surgery

## 2017-04-10 ENCOUNTER — Ambulatory Visit (INDEPENDENT_AMBULATORY_CARE_PROVIDER_SITE_OTHER): Payer: Medicare HMO | Admitting: Orthopedic Surgery

## 2017-04-10 DIAGNOSIS — G8929 Other chronic pain: Secondary | ICD-10-CM | POA: Diagnosis not present

## 2017-04-10 DIAGNOSIS — M25511 Pain in right shoulder: Secondary | ICD-10-CM

## 2017-04-10 NOTE — Progress Notes (Signed)
Office Visit Note   Patient: Melissa Simpson           Date of Birth: 1951-03-06           MRN: [id number] Visit Date: 04/10/2017 Requested by: Gayland Curry, DO [place], [place] [address] PCP: Gayland Curry, DO  Subjective: Chief Complaint  Patient presents with  . Right Shoulder - Pain  . Left Knee - Pain    HPI: Melissa Simpson is a 66 year old patient with right shoulder and left knee pain.  She reports right shoulder pain for months.  Localizes pain to the posterior aspect of the shoulder.  The pain runs down the back of her arm but not below the elbow.  She reports decreased strength.  She denies any history of injury.  Describes pain with movement.  Pain will wake her from sleep at night at times.  She denies any neck pain and numbness and tingling  Patient also describes left knee pain.  Reports primarily medial sided pain.  Denies a history of injury.  Since going on for months.  Moving it at night sometimes will hurt.  Not taking any medications right now for the problem.  She does have kidney issues and cannot take anti-inflammatories.  Really takes over-the-counter Tylenol only.              ROS: All systems reviewed are negative as they relate to the chief complaint within the history of present illness.  Patient denies  fevers or chills.   Assessment & Plan: Visit Diagnoses:  1. Chronic right shoulder pain     Plan: Impression is right shoulder pain possible rotator cuff pathology.  She had an MRI scan over 10 years ago.  She has some acromioclavicular joint arthritis on plain x-ray but I think she may have intra-articular pathology based on exam.  This is bothering her enough that she would consider intervention for the problem.  Therefore MRI arthrogram is to be performed to evaluate possible partial thickness or full-thickness rotator cuff tear and possible biceps tendon pathology.  In regards to the knee the patient has medial compartment arthritis.  Levels of  intervention for this would be nonweightbearing quad strengthening exercises along with injections in the last resort would be unicompartmental knee replacement.  Plan at this time is for observation on the left knee MRI scan on the right shoulder  Follow-Up Instructions: Return for after MRI.   Orders:  Orders Placed This Encounter  Procedures  . MR SHOULDER RIGHT W CONTRAST  . Arthrogram   No orders of the defined types were placed in this encounter.     Procedures: No procedures performed   Clinical Data: No additional findings.  Objective: Vital Signs: There were no vitals taken for this visit.  Physical Exam:   Constitutional: Patient appears well-developed HEENT:  Head: Normocephalic Eyes:EOM are normal Neck: Normal range of motion Cardiovascular: Normal rate Pulmonary/chest: Effort normal Neurologic: Patient is alert Skin: Skin is warm Psychiatric: Patient has normal mood and affect    Ortho Exam: Orthopedic exam demonstrates good cervical spine range of motion 5 out of 5 grip EPL FPL interosseous wrist flexion and wrist extension biceps triceps and deltoid strength.  No paresthesias C5-T1.  Radial pulses intact bilaterally.  She does have a little bit of popping in the shoulder with internal/external rotation at 90 of abduction.  No other masses lymph adenopathy or skin changes noted in the right shoulder region negative apprehension relocation testing  no discrete acromioclavicular joint tenderness right left-hand side.  Left knee is examined.  Medial joint line tenderness is present collateral crucial in stable trace effusion is present extensor mechanism is intact pedal pulses palpable no groin pain with internal/external rotation of the leg no other masses lymph adenopathy or skin changes noted in the left knee region  Specialty Comments:  No specialty comments available.  Imaging: No results found.   PMFS History: Patient Active Problem List   Diagnosis  Date Noted  . Essential hypertension, benign 02/28/2017  . Left medial knee pain 02/28/2017  . Elevated liver function tests 02/27/2016  . Hyperlipidemia 02/27/2016  . Right shoulder pain 02/27/2016  . Chronic right hip pain 02/27/2016  . Foot pain, left 02/27/2016  . Caregiver stress 02/27/2016  . Family history of colon cancer 06/02/2015  . Esophageal reflux 06/02/2015  . Chronic maxillary sinusitis 06/02/2015  . Left thyroid nodule 06/02/2015  . Chest pain 12/18/2013  . Dyslipidemia 12/18/2013  . HTN (hypertension) 12/18/2013   Past Medical History:  Diagnosis Date  . GERD (gastroesophageal reflux disease)   . Hyperlipidemia   . Hypertension     Family History  Problem Relation Age of Onset  . Hypertension Father   . Diabetes Father   . Coronary artery disease Father   . Hyperlipidemia Father   . Colon cancer Father   . Hypertension Mother   . Arthritis Mother     Past Surgical History:  Procedure Laterality Date  . BIOPSY THYROID  2008  . TONSILECTOMY, ADENOIDECTOMY, BILATERAL MYRINGOTOMY AND TUBES Bilateral 1957   Social History   Occupational History  . Not on file.   Social History Main Topics  . Smoking status: Never Smoker  . Smokeless tobacco: Never Used  . Alcohol use No  . Drug use: No  . Sexual activity: Not on file

## 2017-04-25 ENCOUNTER — Ambulatory Visit
Admission: RE | Admit: 2017-04-25 | Discharge: 2017-04-25 | Disposition: A | Discharge status: Home / Self Care | Payer: Medicare HMO | Source: Ambulatory Visit | Attending: Orthopedic Surgery | Admitting: Orthopedic Surgery

## 2017-04-25 DIAGNOSIS — G8929 Other chronic pain: Secondary | ICD-10-CM

## 2017-04-25 DIAGNOSIS — M25511 Pain in right shoulder: Principal | ICD-10-CM

## 2017-04-25 DIAGNOSIS — M75121 Complete rotator cuff tear or rupture of right shoulder, not specified as traumatic: Secondary | ICD-10-CM | POA: Diagnosis not present

## 2017-04-25 MED ORDER — IOPAMIDOL (ISOVUE-M 200) INJECTION 41%
15.0000 mL | Freq: Once | INTRAMUSCULAR | Status: AC
  Administered 2017-04-25: 15 mL via INTRA_ARTICULAR

## 2017-05-02 ENCOUNTER — Ambulatory Visit (INDEPENDENT_AMBULATORY_CARE_PROVIDER_SITE_OTHER): Payer: Medicare HMO | Admitting: Orthopedic Surgery

## 2017-05-02 ENCOUNTER — Encounter (INDEPENDENT_AMBULATORY_CARE_PROVIDER_SITE_OTHER): Payer: Self-pay | Admitting: Orthopedic Surgery

## 2017-05-02 DIAGNOSIS — G8929 Other chronic pain: Secondary | ICD-10-CM

## 2017-05-02 DIAGNOSIS — M25511 Pain in right shoulder: Secondary | ICD-10-CM | POA: Diagnosis not present

## 2017-05-05 NOTE — Progress Notes (Signed)
Office Visit Note   Patient: Melissa Simpson           Date of Birth: [birth year]           MRN: [id number] Visit Date: 05/02/2017 Requested by: Gayland Curry, DO [place], [place] [address] PCP: Gayland Curry, DO  Subjective: Chief Complaint  Patient presents with  . Right Shoulder - Follow-up    HPI: Melissa Simpson is a 66 year old patient with shoulder pain and left knee pain.  Since I had an MRI scan which is reviewed.  It does show rotator cuff tear supraspinatus which is a small tear relatively non-retracted as well as biceps tendon tearing.  The patient is not waking up from sleep at night.  In general she believe she can live with this pain and does not want to pursue surgical intervention at this time.  She also reports left-sided mild knee pain and has medial compartment arthritis on that side with no history of surgery.  This is also something she wants to live with.              ROS: All systems reviewed are negative as they relate to the chief complaint within the history of present illness.  Patient denies  fevers or chills.   Assessment & Plan: Visit Diagnoses: No diagnosis found.  Plan: Impression is biceps tendinopathy and small rotator cuff tear of the supraspinatus which is currently mildly symptomatic for the patient.  It is not reached that threshold yet where she wants to undergo surgical intervention.  Injection is a possibility that she wants to hold off on that as well.  In regards to the medial compartment arthritis in the left knee again injection and surgery are the logical next steps.  She is not putting too much demand in terms of overhead use on that right arm other than some occasional painting of their second home.  We will check back on both of these degenerative conditions in 6 months.  Follow-up with me at that time  Follow-Up Instructions: Return in about 6 months (around 11/02/2017).   Orders:  No orders of the defined types were placed in this  encounter.  No orders of the defined types were placed in this encounter.     Procedures: No procedures performed   Clinical Data: No additional findings.  Objective: Vital Signs: There were no vitals taken for this visit.  Physical Exam:   Constitutional: Patient appears well-developed HEENT:  Head: Normocephalic Eyes:EOM are normal Neck: Normal range of motion Cardiovascular: Normal rate Pulmonary/chest: Effort normal Neurologic: Patient is alert Skin: Skin is warm Psychiatric: Patient has normal mood and affect    Ortho Exam: Orthopedic exam demonstrates good cervical spine range of motion 5 out of 5 grip EPL FPL interosseous wrist flexion and wrist extension strength.  Does have some very mild hoarseness with passive range of motion on the right compared to the left but overall reasonable and symmetric rotator cuff strength isolated infraspinatus supraspinatus and subscap muscle testing.  Left knee is examined there is no effusion mild medial joint line tenderness intact extensor mechanism and stable collateral and cruciate ligaments.  Specialty Comments:  No specialty comments available.  Imaging: No results found.   PMFS History: Patient Active Problem List   Diagnosis Date Noted  . Essential hypertension, benign 02/28/2017  . Left medial knee pain 02/28/2017  . Elevated liver function tests 02/27/2016  . Hyperlipidemia 02/27/2016  . Right shoulder pain 02/27/2016  .  Chronic right hip pain 02/27/2016  . Foot pain, left 02/27/2016  . Caregiver stress 02/27/2016  . Family history of colon cancer 06/02/2015  . Esophageal reflux 06/02/2015  . Chronic maxillary sinusitis 06/02/2015  . Left thyroid nodule 06/02/2015  . Chest pain 12/18/2013  . Dyslipidemia 12/18/2013  . HTN (hypertension) 12/18/2013   Past Medical History:  Diagnosis Date  . GERD (gastroesophageal reflux disease)   . Hyperlipidemia   . Hypertension     Family History  Problem Relation  Age of Onset  . Hypertension Father   . Diabetes Father   . Coronary artery disease Father   . Hyperlipidemia Father   . Colon cancer Father   . Hypertension Mother   . Arthritis Mother     Past Surgical History:  Procedure Laterality Date  . BIOPSY THYROID  2008  . TONSILECTOMY, ADENOIDECTOMY, BILATERAL MYRINGOTOMY AND TUBES Bilateral 1957   Social History   Occupational History  . Not on file.   Social History Main Topics  . Smoking status: Never Smoker  . Smokeless tobacco: Never Used  . Alcohol use No  . Drug use: No  . Sexual activity: Not on file

## 2017-06-06 ENCOUNTER — Other Ambulatory Visit: Payer: Medicare HMO

## 2017-06-26 DIAGNOSIS — I10 Essential (primary) hypertension: Secondary | ICD-10-CM | POA: Diagnosis not present

## 2017-06-26 DIAGNOSIS — H5210 Myopia, unspecified eye: Secondary | ICD-10-CM | POA: Diagnosis not present

## 2017-07-18 ENCOUNTER — Other Ambulatory Visit: Payer: Self-pay | Admitting: Internal Medicine

## 2017-07-18 DIAGNOSIS — I10 Essential (primary) hypertension: Secondary | ICD-10-CM

## 2017-07-18 DIAGNOSIS — K219 Gastro-esophageal reflux disease without esophagitis: Secondary | ICD-10-CM

## 2017-08-21 ENCOUNTER — Encounter: Payer: Self-pay | Admitting: Internal Medicine

## 2017-08-21 ENCOUNTER — Ambulatory Visit: Payer: Medicare HMO | Admitting: Internal Medicine

## 2017-08-21 VITALS — BP 138/88 | HR 55 | Temp 97.8°F | Ht <= 58 in | Wt 158.0 lb

## 2017-08-21 DIAGNOSIS — R8290 Unspecified abnormal findings in urine: Secondary | ICD-10-CM

## 2017-08-21 DIAGNOSIS — R3915 Urgency of urination: Secondary | ICD-10-CM

## 2017-08-21 LAB — POCT URINALYSIS DIPSTICK
APPEARANCE: NORMAL
Bilirubin, UA: NEGATIVE
GLUCOSE UA: NEGATIVE
Ketones, UA: NEGATIVE
LEUKOCYTES UA: NEGATIVE
Nitrite, UA: NEGATIVE
PH UA: 5 (ref 5.0–8.0)
PROTEIN UA: NEGATIVE
Spec Grav, UA: 1.015 (ref 1.010–1.025)
Urobilinogen, UA: 0.2 E.U./dL

## 2017-08-21 NOTE — Progress Notes (Signed)
Patient ID: Melissa Simpson, female   DOB: [birth year], 66 y.o.   MRN: [id number]   Location:  [facility] OFFICE  Provider: DR Arletha Grippe  Code Status: FULL CODE Goals of Care:  Advanced Directives 02/28/2017  Does Patient Have a Medical Advance Directive? No  Would patient like information on creating a medical advance directive? No - Patient declined     Chief Complaint  Patient presents with  . Urine Output    urgency, and pain when urinating started this am    HPI: Patient is a 66 y.o. female seen today for increased urinary urgency and dysuria that began this AM at 0300. No hematuria. No abdominal pain, nausea/vomiting, f/c. Sx's have actually improved as the day has progressed. She does not drink a lot of water during the day. No change in activity level. No new exercise routine. She is sexually active - last done 2 days ago and she does not usually void after sex. No hematuria. No vaginal d/c. She has HTN and takes losartan hct. Urine dipstick reveals trace blood but otherwise neg.  Past Medical History:  Diagnosis Date  . GERD (gastroesophageal reflux disease)   . Hyperlipidemia   . Hypertension     Past Surgical History:  Procedure Laterality Date  . BIOPSY THYROID  2008  . TONSILECTOMY, ADENOIDECTOMY, BILATERAL MYRINGOTOMY AND TUBES Bilateral 1957     reports that  has never smoked. she has never used smokeless tobacco. She reports that she does not drink alcohol or use drugs. Social History   Socioeconomic History  . Marital status: Married    Spouse name: Not on file  . Number of children: Not on file  . Years of education: Not on file  . Highest education level: Not on file  Social Needs  . Financial resource strain: Not on file  . Food insecurity - worry: Not on file  . Food insecurity - inability: Not on file  . Transportation needs - medical: Not on file  . Transportation needs - non-medical: Not on file  Occupational History  . Not on file  Tobacco Use    . Smoking status: Never Smoker  . Smokeless tobacco: Never Used  Substance and Sexual Activity  . Alcohol use: No  . Drug use: No  . Sexual activity: Not on file  Other Topics Concern  . Not on file  Social History Narrative   Diet:Unrestricted   Do you drink/eat things with caffeine? Rarely   Marital status: Married                             What year were you married? 1992   Do you live in a house, apartment, assisted living, condo, trailer, etc)? House   Is it one or more stories? 1   How many persons live in your home? 3   Do you have any pets in your home?  2 small dogs   Current or past profession: Chief of Staff   Do you exercise?                                                     Type & how often:   Do you have a living will? No   Do you have a DNR Form? No  Do you have a POA/HPOA forms? NO    Family History  Problem Relation Age of Onset  . Hypertension Father   . Diabetes Father   . Coronary artery disease Father   . Hyperlipidemia Father   . Colon cancer Father   . Hypertension Mother   . Arthritis Mother     No Known Allergies  Outpatient Encounter Medications as of 08/21/2017  Medication Sig  . alendronate (FOSAMAX) 70 MG tablet TAKE 1 TABLET EVERY WEEK  . b complex vitamins tablet Take 1 tablet by mouth daily.  . Black Cohosh 40 MG CAPS Take 40 mg by mouth daily.  . Calcium Carbonate-Vit D-Min (CALCIUM 600 + MINERALS PO) Take by mouth 2 (two) times daily.  Marland Kitchen losartan-hydrochlorothiazide (HYZAAR) 100-12.5 MG tablet TAKE 1 TABLET EVERY DAY  . metoprolol tartrate (LOPRESSOR) 50 MG tablet TAKE 1 TABLET TWICE DAILY  . Multiple Vitamin (MULTIVITAMIN) tablet Take 1 tablet by mouth 2 (two) times daily.   Marland Kitchen omeprazole (PRILOSEC) 20 MG capsule TAKE 1 CAPSULE EVERY DAY  . OVER THE COUNTER MEDICATION Replenex - chondroitin, MSM, glucosamine, ginger, green tea -Take 2 pills in am and 1 pill at night  . OVER THE COUNTER MEDICATION daily. Phytomega -  phytosterol esters 1000mg /fishoil 500mg /C&E -Take one pill twice a day  . Probiotic Product (PROBIOTIC DAILY PO) Take by mouth daily.  . simvastatin (ZOCOR) 20 MG tablet Take 1 tablet (20 mg total) by mouth daily.  . TURMERIC PO Take 1 tablet by mouth daily at 2 am.  . vitamin C (ASCORBIC ACID) 500 MG tablet Take 500 mg by mouth daily.   Facility-Administered Encounter Medications as of 08/21/2017  Medication  . 0.9 %  sodium chloride infusion    Review of Systems:  Review of Systems  Health Maintenance  Topic Date Due  . INFLUENZA VACCINE  04/10/2017  . MAMMOGRAM  08/13/2018  . TETANUS/TDAP  06/23/2022  . COLONOSCOPY  11/06/2026  . DEXA SCAN  Completed  . Hepatitis C Screening  Completed  . PNA vac Low Risk Adult  Completed    Physical Exam: Vitals:   08/21/17 1134  BP: 138/88  Pulse: (!) 55  Temp: 97.8 F (36.6 C)  TempSrc: Oral  SpO2: 95%  Weight: 158 lb (71.7 kg)  Height: 4\' 10"  (1.473 m)   Body mass index is 33.02 kg/m. Physical Exam  Constitutional: She is oriented to person, place, and time. Vital signs are normal. She appears well-developed and well-nourished.  Non-toxic appearance. She does not have a sickly appearance. No distress.  Cardiovascular: Normal rate, regular rhythm, normal heart sounds and intact distal pulses. Exam reveals no gallop and no friction rub.  No murmur heard. No LE edema b/l. No calf TTP  Pulmonary/Chest: Effort normal and breath sounds normal. No respiratory distress. She has no wheezes. She has no rales. She exhibits no tenderness.  Abdominal: Soft. Normal appearance and bowel sounds are normal. She exhibits no distension, no fluid wave, no abdominal bruit, no ascites, no pulsatile midline mass and no mass. There is no hepatomegaly. There is tenderness (RLQ) in the right lower quadrant. There is no rigidity, no rebound, no guarding and no CVA tenderness. No hernia.    Musculoskeletal: She exhibits no edema.  Neurological: She is  alert and oriented to person, place, and time.  Skin: Skin is warm and dry. No rash noted.  Psychiatric: She has a normal mood and affect. Her behavior is normal. Judgment and thought content normal.  Labs reviewed: Basic Metabolic Panel: Recent Labs    02/26/17 1007  NA 138  K 4.1  CL 106  CO2 20  GLUCOSE 94  BUN 18  CREATININE 0.91  CALCIUM 9.1   Liver Function Tests: Recent Labs    08/28/16 1001 02/26/17 1007  AST 28 36*  ALT 32* 27  ALKPHOS 20* 23*  BILITOT 0.9 0.9  PROT 6.9 7.1  ALBUMIN 4.3 4.3   No results for input(s): LIPASE, AMYLASE in the last 8760 hours. No results for input(s): AMMONIA in the last 8760 hours. CBC: No results for input(s): WBC, NEUTROABS, HGB, HCT, MCV, PLT in the last 8760 hours. Lipid Panel: Recent Labs    08/28/16 1001 02/26/17 1007  CHOL 129 157  HDL 34* 42*  LDLCALC 54 64  TRIG 206* 255*  CHOLHDL 3.8 3.7   Lab Results  Component Value Date   HGBA1C 6.0 (H) 02/29/2016    Procedures since last visit: No results found.  Assessment/Plan   ICD-10-CM   1. Urinary urgency R39.15 POC Urinalysis Dipstick    Culture, Urine  2. Abnormal urinalysis R82.90 Culture, Urine   Doubt appendicitis due to presentation  Probable kidney stone - drink plenty of water today and tomorrow. If you spike a fever (>100.53F), nausea, vomiting, severe abdominal pain, blood y urine, go to the ER!  Will call with culture results  Recommend voiding after sexual intercourse  May need CT stone study if sx's return or persist  Follow up with Dr Mariea Clonts as scheduled  [facility] Perlie Gold  [facility] [address] [place], [place] [address] [phone] Cell (Monday-Friday 8 AM - 5 PM) [phone] After 5 PM and follow prompts

## 2017-08-21 NOTE — Patient Instructions (Signed)
Probable kidney stone - drink plenty of water today and tomorrow. If you spike a fever (>100.63F), nausea, vomiting, severe abdominal pain, blood y urine, go to the ER!  Will call with culture results  Follow up with Dr Mariea Clonts as scheduled

## 2017-08-23 ENCOUNTER — Other Ambulatory Visit: Payer: Self-pay

## 2017-08-23 MED ORDER — SULFAMETHOXAZOLE-TRIMETHOPRIM 800-160 MG PO TABS: 1.0000 | ORAL_TABLET | Freq: Two times a day (BID) | ORAL | 0 refills | Status: DC

## 2017-08-23 NOTE — Telephone Encounter (Signed)
Medication list has been updated to reflect antibiotic recommendations based on lab results. Patient is currently out of town, so prescription was sent to [facility] on [address] in [place]. Patient verified that this was the correct pharmacy while on the phone.

## 2017-08-24 LAB — URINE CULTURE
MICRO NUMBER:: 81398674
SPECIMEN QUALITY:: ADEQUATE

## 2017-08-26 MED ORDER — CIPROFLOXACIN HCL 250 MG PO TABS: 250.0000 mg | ORAL_TABLET | Freq: Two times a day (BID) | ORAL | 0 refills | Status: DC

## 2017-08-26 NOTE — Addendum Note (Signed)
Addended by: Moshe Cipro, Chelsee Hosie A on: 08/26/2017 02:00 PM   Modules accepted: Orders

## 2017-08-27 DIAGNOSIS — D279 Benign neoplasm of unspecified ovary: Secondary | ICD-10-CM | POA: Diagnosis not present

## 2017-08-27 DIAGNOSIS — N959 Unspecified menopausal and perimenopausal disorder: Secondary | ICD-10-CM | POA: Diagnosis not present

## 2017-08-27 DIAGNOSIS — Z6829 Body mass index (BMI) 29.0-29.9, adult: Secondary | ICD-10-CM | POA: Diagnosis not present

## 2017-08-27 DIAGNOSIS — D259 Leiomyoma of uterus, unspecified: Secondary | ICD-10-CM | POA: Diagnosis not present

## 2017-08-28 DIAGNOSIS — Z1231 Encounter for screening mammogram for malignant neoplasm of breast: Secondary | ICD-10-CM | POA: Diagnosis not present

## 2017-08-28 LAB — HM MAMMOGRAPHY

## 2017-09-04 ENCOUNTER — Encounter: Payer: Self-pay | Admitting: *Deleted

## 2017-09-05 ENCOUNTER — Other Ambulatory Visit: Payer: Medicare HMO

## 2017-09-05 ENCOUNTER — Ambulatory Visit (INDEPENDENT_AMBULATORY_CARE_PROVIDER_SITE_OTHER): Payer: Medicare HMO

## 2017-09-05 VITALS — BP 126/82 | HR 62 | Temp 97.6°F | Ht <= 58 in | Wt 158.0 lb

## 2017-09-05 DIAGNOSIS — R739 Hyperglycemia, unspecified: Secondary | ICD-10-CM | POA: Diagnosis not present

## 2017-09-05 DIAGNOSIS — E785 Hyperlipidemia, unspecified: Secondary | ICD-10-CM | POA: Diagnosis not present

## 2017-09-05 DIAGNOSIS — Z0000 Encounter for general adult medical examination without abnormal findings: Secondary | ICD-10-CM

## 2017-09-05 DIAGNOSIS — K219 Gastro-esophageal reflux disease without esophagitis: Secondary | ICD-10-CM | POA: Diagnosis not present

## 2017-09-05 DIAGNOSIS — I10 Essential (primary) hypertension: Secondary | ICD-10-CM | POA: Diagnosis not present

## 2017-09-05 MED ORDER — ZOSTER VAC RECOMB ADJUVANTED 50 MCG/0.5ML IM SUSR: 0.5000 mL | Freq: Once | INTRAMUSCULAR | 1 refills | Status: AC

## 2017-09-05 MED ORDER — OMEPRAZOLE 20 MG PO CPDR: 20.0000 mg | DELAYED_RELEASE_CAPSULE | Freq: Every day | ORAL | 3 refills | Status: DC

## 2017-09-05 MED ORDER — SIMVASTATIN 20 MG PO TABS: 20.0000 mg | ORAL_TABLET | Freq: Every day | ORAL | 3 refills | Status: DC

## 2017-09-05 MED ORDER — LOSARTAN POTASSIUM-HCTZ 100-12.5 MG PO TABS: 1.0000 | ORAL_TABLET | Freq: Every day | ORAL | 3 refills | Status: DC

## 2017-09-05 MED ORDER — ALENDRONATE SODIUM 70 MG PO TABS: ORAL_TABLET | ORAL | 3 refills | Status: DC

## 2017-09-05 MED ORDER — METOPROLOL TARTRATE 50 MG PO TABS: ORAL_TABLET | ORAL | 1 refills | Status: DC

## 2017-09-05 NOTE — Progress Notes (Signed)
Subjective:   Melissa Simpson is a 66 y.o. female who presents for Medicare Annual (Subsequent) preventive examination.  Last AWV-08/30/2016    Objective:     Vitals: BP 126/82 (BP Location: Left Arm, Patient Position: Sitting)   Pulse 62   Temp 97.6 F (36.4 C) (Oral)   Ht 4\' 10"  (1.473 m)   Wt 158 lb (71.7 kg)   SpO2 97%   BMI 33.02 kg/m   Body mass index is 33.02 kg/m.  Advanced Directives 09/05/2017 02/28/2017 11/06/2016 08/29/2015 06/02/2015  Does Patient Have a Medical Advance Directive? No No No No No  Would patient like information on creating a medical advance directive? No - Patient declined No - Patient declined - Yes - Scientist, clinical (histocompatibility and immunogenetics) given Yes - Scientist, clinical (histocompatibility and immunogenetics) given    Tobacco Social History   Tobacco Use  Smoking Status Never Smoker  Smokeless Tobacco Never Used     Counseling given: Not Answered   Clinical Intake:  Pre-visit preparation completed: No  Pain : No/denies pain     Diabetes: No  How often do you need to have someone help you when you read instructions, pamphlets, or other written materials from your doctor or pharmacy?: 1 - Never What is the last grade level you completed in school?: Melissa Simpson Needed?: No  Information entered by :: Melissa Dense, RN  Past Medical History:  Diagnosis Date  . GERD (gastroesophageal reflux disease)   . Hyperlipidemia   . Hypertension    Past Surgical History:  Procedure Laterality Date  . BIOPSY THYROID  2008  . TONSILECTOMY, ADENOIDECTOMY, BILATERAL MYRINGOTOMY AND TUBES Bilateral 1957   Family History  Problem Relation Age of Onset  . Hypertension Father   . Diabetes Father   . Coronary artery disease Father   . Hyperlipidemia Father   . Colon cancer Father   . Hypertension Mother   . Arthritis Mother    Social History   Socioeconomic History  . Marital status: Married    Spouse name: None  . Number of children: None  . Years of education: None  .  Highest education level: None  Social Needs  . Financial resource strain: Not hard at all  . Food insecurity - worry: Never true  . Food insecurity - inability: Never true  . Transportation needs - medical: No  . Transportation needs - non-medical: No  Occupational History  . None  Tobacco Use  . Smoking status: Never Smoker  . Smokeless tobacco: Never Used  Substance and Sexual Activity  . Alcohol use: No  . Drug use: No  . Sexual activity: None  Other Topics Concern  . None  Social History Narrative   Diet:Unrestricted   Do you drink/eat things with caffeine? Rarely   Marital status: Married                             What year were you married? 1992   Do you live in a house, apartment, assisted living, condo, trailer, etc)? House   Is it one or more stories? 1   How many persons live in your home? 3   Do you have any pets in your home?  2 small dogs   Current or past profession: Chief of Staff   Do you exercise?  Type & how often:   Do you have a living will? No   Do you have a DNR Form? No   Do you have a POA/HPOA forms? NO    Outpatient Encounter Medications as of 09/05/2017  Medication Sig  . alendronate (FOSAMAX) 70 MG tablet TAKE 1 TABLET EVERY WEEK  . b complex vitamins tablet Take 1 tablet by mouth daily.  . Black Cohosh 40 MG CAPS Take 40 mg by mouth daily.  . Calcium Carbonate-Vit D-Min (CALCIUM 600 + MINERALS PO) Take by mouth 2 (two) times daily.  Marland Kitchen losartan-hydrochlorothiazide (HYZAAR) 100-12.5 MG tablet TAKE 1 TABLET EVERY DAY  . metoprolol tartrate (LOPRESSOR) 50 MG tablet TAKE 1 TABLET TWICE DAILY  . Multiple Vitamin (MULTIVITAMIN) tablet Take 1 tablet by mouth 2 (two) times daily.   Marland Kitchen omeprazole (PRILOSEC) 20 MG capsule TAKE 1 CAPSULE EVERY DAY  . OVER THE COUNTER MEDICATION Replenex - chondroitin, MSM, glucosamine, ginger, green tea -Take 2 pills in am and 1 pill at night  . OVER THE COUNTER  MEDICATION daily. Phytomega - phytosterol esters 1000mg /fishoil 500mg /C&E -Take one pill twice a day  . Probiotic Product (PROBIOTIC DAILY PO) Take by mouth daily.  . simvastatin (ZOCOR) 20 MG tablet Take 1 tablet (20 mg total) by mouth daily.  . TURMERIC PO Take 1 tablet by mouth daily at 2 am.  . vitamin C (ASCORBIC ACID) 500 MG tablet Take 500 mg by mouth daily.  . [DISCONTINUED] ciprofloxacin (CIPRO) 250 MG tablet Take 1 tablet (250 mg total) by mouth 2 (two) times daily.  . [DISCONTINUED] sulfamethoxazole-trimethoprim (BACTRIM DS,SEPTRA DS) 800-160 MG tablet Take 1 tablet by mouth 2 (two) times daily.   Facility-Administered Encounter Medications as of 09/05/2017  Medication  . 0.9 %  sodium chloride infusion    Activities of Daily Living In your present state of health, do you have any difficulty performing the following activities: 09/05/2017  Hearing? N  Vision? N  Difficulty concentrating or making decisions? N  Walking or climbing stairs? N  Dressing or bathing? N  Doing errands, shopping? N  Preparing Food and eating ? N  Using the Toilet? N  In the past six months, have you accidently leaked urine? Y  Do you have problems with loss of bowel control? N  Managing your Medications? N  Managing your Finances? N  Housekeeping or managing your Housekeeping? N  Some recent data might be hidden    Patient Care Team: Gayland Curry, DO as PCP - General (Geriatric Medicine)    Assessment:   This is a routine wellness examination for [name].  Exercise Activities and Dietary recommendations Current Exercise Habits: The patient does not participate in regular exercise at present, Exercise limited by: None identified  Goals    None      Fall Risk Fall Risk  09/05/2017 08/21/2017 02/28/2017 08/30/2016 02/27/2016  Falls in the past year? No No No No No   Is the patient's home free of loose throw rugs in walkways, pet beds, electrical cords, etc?   yes      Grab bars in  the bathroom? yes      Handrails on the stairs?   yes      Adequate lighting?   yes  Timed Get Up and Go performed: 20 seconds, fall risk  Depression Screen PHQ 2/9 Scores 09/05/2017 08/21/2017 02/28/2017 08/30/2016  PHQ - 2 Score 0 0 0 0     Cognitive Function MMSE - Mini Mental State Exam  09/05/2017 08/30/2016  Orientation to time 5 5  Orientation to Place 5 5  Registration 3 3  Attention/ Calculation 5 5  Recall 2 3  Language- name 2 objects 2 2  Language- repeat 1 1  Language- follow 3 step command 3 3  Language- read & follow direction 1 1  Write a sentence 1 1  Copy design 1 1  Total score 29 30        Immunization History  Administered Date(s) Administered  . Influenza, High Dose Seasonal PF 06/10/2017  . Influenza-Unspecified 06/11/2014, 06/11/2015, 06/27/2016  . Pneumococcal Conjugate-13 07/06/2014  . Pneumococcal Polysaccharide-23 08/29/2015  . Tdap 06/23/2012    Qualifies for Shingles Vaccine? Yes, educated and sent to pharmacy  Screening Tests Health Maintenance  Topic Date Due  . MAMMOGRAM  08/30/2019  . TETANUS/TDAP  06/23/2022  . COLONOSCOPY  11/06/2026  . INFLUENZA VACCINE  Completed  . DEXA SCAN  Completed  . Hepatitis C Screening  Completed  . PNA vac Low Risk Adult  Completed    Cancer Screenings: Lung: Low Dose CT Chest recommended if Age 61-80 years, 30 pack-year currently smoking OR have quit w/in 15years. Patient does not qualify. Breast:  Up to date on Mammogram? Yes   Up to date of Bone Density/Dexa? Yes Colorectal: up to date  Additional Screenings:  Hepatitis B/HIV/Syphillis: Not indicated Hepatitis C Screening: Not indicated     Plan:    I have personally reviewed and addressed the Medicare Annual Wellness questionnaire and have noted the following in the patient's chart:  A. Medical and social history B. Use of alcohol, tobacco or illicit drugs  C. Current medications and supplements D. Functional ability and status E.    Nutritional status F.  Physical activity G. Advance directives H. List of other physicians I.  Hospitalizations, surgeries, and ER visits in previous 12 months J.  [place] to include hearing, vision, cognitive, depression L. Referrals and appointments - none  In addition, I have reviewed and discussed with patient certain preventive protocols, quality metrics, and best practice recommendations. A written personalized care plan for preventive services as well as general preventive health recommendations were provided to patient.  See attached scanned questionnaire for additional information.   Signed,   Melissa Dense, RN Nurse Health Advisor   Quick Notes   Health Maintenance: Refilled prescripitons. Shingrix prescription sent to pharmacy     Abnormal Screen: MMSE 29/30. Passed clock drawing     Patient Concerns: none     Nurse Concerns: none

## 2017-09-05 NOTE — Patient Instructions (Signed)
Melissa Simpson , Thank you for taking time to come for your Medicare Wellness Visit. I appreciate your ongoing commitment to your health goals. Please review the following plan we discussed and let me know if I can assist you in the future.   Screening recommendations/referrals: Colonoscopy up to date  Mammogram up to date, due 08/30/2018 Bone Density up to date Recommended yearly ophthalmology/optometry visit for glaucoma screening and checkup Recommended yearly dental visit for hygiene and checkup  Vaccinations: Influenza vaccine up to date. Due 2019 fall season Pneumococcal vaccine up to date Tdap vaccine up to date. Due 06/23/2022 Shingles vaccine due, prescription sent to you pharmacy  Advanced directives: Advance directive discussed with you today. I have provided a copy for you to complete at home and have notarized. Once this is complete please bring a copy in to our office so we can scan it into your chart.  Conditions/risks identified: None  Next appointment: Dr. Mariea Clonts 09/12/2017 @ [place], RN    Preventive Care 65 Years and Older, Female Preventive care refers to lifestyle choices and visits with your health care provider that can promote health and wellness. What does preventive care include?  A yearly physical exam. This is also called an annual well check.  Dental exams once or twice a year.  Routine eye exams. Ask your health care provider how often you should have your eyes checked.  Personal lifestyle choices, including:  Daily care of your teeth and gums.  Regular physical activity.  Eating a healthy diet.  Avoiding tobacco and drug use.  Limiting alcohol use.  Practicing safe sex.  Taking low-dose aspirin every day.  Taking vitamin and mineral supplements as recommended by your health care provider. What happens during an annual well check? The services and screenings done by your health care provider during your annual well check  will depend on your age, overall health, lifestyle risk factors, and family history of disease. Counseling  Your health care provider may ask you questions about your:  Alcohol use.  Tobacco use.  Drug use.  Emotional well-being.  Home and relationship well-being.  Sexual activity.  Eating habits.  History of falls.  Memory and ability to understand (cognition).  Work and work Statistician.  Reproductive health. Screening  You may have the following tests or measurements:  Height, weight, and BMI.  Blood pressure.  Lipid and cholesterol levels. These may be checked every 5 years, or more frequently if you are over 80 years old.  Skin check.  Lung cancer screening. You may have this screening every year starting at age 63 if you have a 30-pack-year history of smoking and currently smoke or have quit within the past 15 years.  Fecal occult blood test (FOBT) of the stool. You may have this test every year starting at age 59.  Flexible sigmoidoscopy or colonoscopy. You may have a sigmoidoscopy every 5 years or a colonoscopy every 10 years starting at age 34.  Hepatitis C blood test.  Hepatitis B blood test.  Sexually transmitted disease (STD) testing.  Diabetes screening. This is done by checking your blood sugar (glucose) after you have not eaten for a while (fasting). You may have this done every 1-3 years.  Bone density scan. This is done to screen for osteoporosis. You may have this done starting at age 10.  Mammogram. This may be done every 1-2 years. Talk to your health care provider  about how often you should have regular mammograms. Talk with your health care provider about your test results, treatment options, and if necessary, the need for more tests. Vaccines  Your health care provider may recommend certain vaccines, such as:  Influenza vaccine. This is recommended every year.  Tetanus, diphtheria, and acellular pertussis (Tdap, Td) vaccine. You may  need a Td booster every 10 years.  Zoster vaccine. You may need this after age 57.  Pneumococcal 13-valent conjugate (PCV13) vaccine. One dose is recommended after age 33.  Pneumococcal polysaccharide (PPSV23) vaccine. One dose is recommended after age 70. Talk to your health care provider about which screenings and vaccines you need and how often you need them. This information is not intended to replace advice given to you by your health care provider. Make sure you discuss any questions you have with your health care provider. Document Released: 09/23/2015 Document Revised: 05/16/2016 Document Reviewed: 06/28/2015 Elsevier Interactive Patient Education  2017 [employer] Prevention in the Home Falls can cause injuries. They can happen to people of all ages. There are many things you can do to make your home safe and to help prevent falls. What can I do on the outside of my home?  Regularly fix the edges of walkways and driveways and fix any cracks.  Remove anything that might make you trip as you walk through a door, such as a raised step or threshold.  Trim any bushes or trees on the path to your home.  Use bright outdoor lighting.  Clear any walking paths of anything that might make someone trip, such as rocks or tools.  Regularly check to see if handrails are loose or broken. Make sure that both sides of any steps have handrails.  Any raised decks and porches should have guardrails on the edges.  Have any leaves, snow, or ice cleared regularly.  Use sand or salt on walking paths during winter.  Clean up any spills in your garage right away. This includes oil or grease spills. What can I do in the bathroom?  Use night lights.  Install grab bars by the toilet and in the tub and shower. Do not use towel bars as grab bars.  Use non-skid mats or decals in the tub or shower.  If you need to sit down in the shower, use a plastic, non-slip stool.  Keep the floor  dry. Clean up any water that spills on the floor as soon as it happens.  Remove soap buildup in the tub or shower regularly.  Attach bath mats securely with double-sided non-slip rug tape.  Do not have throw rugs and other things on the floor that can make you trip. What can I do in the bedroom?  Use night lights.  Make sure that you have a light by your bed that is easy to reach.  Do not use any sheets or blankets that are too big for your bed. They should not hang down onto the floor.  Have a firm chair that has side arms. You can use this for support while you get dressed.  Do not have throw rugs and other things on the floor that can make you trip. What can I do in the kitchen?  Clean up any spills right away.  Avoid walking on wet floors.  Keep items that you use a lot in easy-to-reach places.  If you need to reach something above you, use a strong step stool that has a grab bar.  Keep electrical cords out of the way.  Do not use floor polish or wax that makes floors slippery. If you must use wax, use non-skid floor wax.  Do not have throw rugs and other things on the floor that can make you trip. What can I do with my stairs?  Do not leave any items on the stairs.  Make sure that there are handrails on both sides of the stairs and use them. Fix handrails that are broken or loose. Make sure that handrails are as long as the stairways.  Check any carpeting to make sure that it is firmly attached to the stairs. Fix any carpet that is loose or worn.  Avoid having throw rugs at the top or bottom of the stairs. If you do have throw rugs, attach them to the floor with carpet tape.  Make sure that you have a light switch at the top of the stairs and the bottom of the stairs. If you do not have them, ask someone to add them for you. What else can I do to help prevent falls?  Wear shoes that:  Do not have high heels.  Have rubber bottoms.  Are comfortable and fit you  well.  Are closed at the toe. Do not wear sandals.  If you use a stepladder:  Make sure that it is fully opened. Do not climb a closed stepladder.  Make sure that both sides of the stepladder are locked into place.  Ask someone to hold it for you, if possible.  Clearly mark and make sure that you can see:  Any grab bars or handrails.  First and last steps.  Where the edge of each step is.  Use tools that help you move around (mobility aids) if they are needed. These include:  Canes.  Walkers.  Scooters.  Crutches.  Turn on the lights when you go into a dark area. Replace any light bulbs as soon as they burn out.  Set up your furniture so you have a clear path. Avoid moving your furniture around.  If any of your floors are uneven, fix them.  If there are any pets around you, be aware of where they are.  Review your medicines with your doctor. Some medicines can make you feel dizzy. This can increase your chance of falling. Ask your doctor what other things that you can do to help prevent falls. This information is not intended to replace advice given to you by your health care provider. Make sure you discuss any questions you have with your health care provider. Document Released: 06/23/2009 Document Revised: 02/02/2016 Document Reviewed: 10/01/2014 Elsevier Interactive Patient Education  2017 [employer].

## 2017-09-06 LAB — COMPLETE METABOLIC PANEL WITH GFR
AG Ratio: 1.8 (calc) (ref 1.0–2.5)
ALT: 28 U/L (ref 6–29)
AST: 25 U/L (ref 10–35)
Albumin: 4.2 g/dL (ref 3.6–5.1)
Alkaline phosphatase (APISO): 27 U/L — ABNORMAL LOW (ref 33–130)
BUN: 18 mg/dL (ref 7–25)
CO2: 31 mmol/L (ref 20–32)
Calcium: 9.2 mg/dL (ref 8.6–10.4)
Chloride: 104 mmol/L (ref 98–110)
Creat: 0.92 mg/dL (ref 0.50–0.99)
GFR, Est African American: 74 mL/min/{1.73_m2} (ref >=60)
GFR, Est Non African American: 64 mL/min/{1.73_m2} (ref >=60)
Globulin: 2.3 g/dL (calc) (ref 1.9–3.7)
Glucose, Bld: 103 mg/dL — ABNORMAL HIGH (ref 65–99)
Potassium: 4.1 mmol/L (ref 3.5–5.3)
Sodium: 140 mmol/L (ref 135–146)
Total Bilirubin: 0.6 mg/dL (ref 0.2–1.2)
Total Protein: 6.5 g/dL (ref 6.1–8.1)

## 2017-09-06 LAB — CBC WITH DIFFERENTIAL/PLATELET
Basophils Absolute: 68 cells/uL (ref 0–200)
Basophils Relative: 1 %
Eosinophils Absolute: 252 cells/uL (ref 15–500)
Eosinophils Relative: 3.7 %
HCT: 42.2 % (ref 35.0–45.0)
Hemoglobin: 14.7 g/dL (ref 11.7–15.5)
Lymphs Abs: 2360 cells/uL (ref 850–3900)
MCH: 33.3 pg — ABNORMAL HIGH (ref 27.0–33.0)
MCHC: 34.8 g/dL (ref 32.0–36.0)
MCV: 95.7 fL (ref 80.0–100.0)
MPV: 9.7 fL (ref 7.5–12.5)
Monocytes Relative: 10.4 %
Neutro Abs: 3414 cells/uL (ref 1500–7800)
Neutrophils Relative %: 50.2 %
Platelets: 305 10*3/uL (ref 140–400)
RBC: 4.41 10*6/uL (ref 3.80–5.10)
RDW: 11.9 % (ref 11.0–15.0)
Total Lymphocyte: 34.7 %
WBC mixed population: 707 cells/uL (ref 200–950)
WBC: 6.8 10*3/uL (ref 3.8–10.8)

## 2017-09-06 LAB — LIPID PANEL
Cholesterol: 148 mg/dL (ref <200)
HDL: 43 mg/dL — ABNORMAL LOW (ref >50)
LDL Cholesterol (Calc): 72 mg/dL (calc)
Non-HDL Cholesterol (Calc): 105 mg/dL (calc) (ref <130)
Total CHOL/HDL Ratio: 3.4 (calc) (ref <5.0)
Triglycerides: 239 mg/dL — ABNORMAL HIGH (ref <150)

## 2017-09-06 LAB — HEMOGLOBIN A1C
Hgb A1c MFr Bld: 5.9 % of total Hgb — ABNORMAL HIGH (ref <5.7)
Mean Plasma Glucose: 123 (calc)
eAG (mmol/L): 6.8 (calc)

## 2017-09-09 ENCOUNTER — Encounter: Payer: Self-pay | Admitting: *Deleted

## 2017-09-10 DIAGNOSIS — Z86718 Personal history of other venous thrombosis and embolism: Secondary | ICD-10-CM

## 2017-09-10 HISTORY — DX: Personal history of other venous thrombosis and embolism: Z86.718

## 2017-09-12 ENCOUNTER — Encounter: Payer: Self-pay | Admitting: Internal Medicine

## 2017-09-12 ENCOUNTER — Ambulatory Visit (INDEPENDENT_AMBULATORY_CARE_PROVIDER_SITE_OTHER): Payer: Medicare HMO | Admitting: Internal Medicine

## 2017-09-12 VITALS — BP 158/90 | HR 50 | Temp 97.8°F | Ht <= 58 in | Wt 157.0 lb

## 2017-09-12 DIAGNOSIS — K219 Gastro-esophageal reflux disease without esophagitis: Secondary | ICD-10-CM | POA: Diagnosis not present

## 2017-09-12 DIAGNOSIS — I10 Essential (primary) hypertension: Secondary | ICD-10-CM

## 2017-09-12 DIAGNOSIS — R739 Hyperglycemia, unspecified: Secondary | ICD-10-CM | POA: Diagnosis not present

## 2017-09-12 DIAGNOSIS — Z0000 Encounter for general adult medical examination without abnormal findings: Secondary | ICD-10-CM

## 2017-09-12 DIAGNOSIS — M75101 Unspecified rotator cuff tear or rupture of right shoulder, not specified as traumatic: Secondary | ICD-10-CM | POA: Diagnosis not present

## 2017-09-12 DIAGNOSIS — S46111S Strain of muscle, fascia and tendon of long head of biceps, right arm, sequela: Secondary | ICD-10-CM | POA: Diagnosis not present

## 2017-09-12 DIAGNOSIS — J3489 Other specified disorders of nose and nasal sinuses: Secondary | ICD-10-CM

## 2017-09-12 DIAGNOSIS — M170 Bilateral primary osteoarthritis of knee: Secondary | ICD-10-CM

## 2017-09-12 DIAGNOSIS — E785 Hyperlipidemia, unspecified: Secondary | ICD-10-CM

## 2017-09-12 MED ORDER — DICLOFENAC SODIUM 1 % TD GEL: TRANSDERMAL | 0 refills | Status: DC

## 2017-09-12 NOTE — Progress Notes (Signed)
Provider:  Rexene Edison. Mariea Clonts, D.O., C.M.D. Location:   [facility]  Place of Service:   clinic  Previous PCP: Gayland Curry, DO Patient Care Team: Gayland Curry, DO as PCP - General (Geriatric Medicine)  Extended Emergency Contact Information Primary Emergency Contact: Ellett,[name] Address: [address]          [place], [place] [address] Johnnette Litter of [place] Phone: [phone] Mobile Phone: [phone] Relation: Spouse  Code Status: full code Goals of Care: Advanced Directive information Advanced Directives 09/05/2017  Does Patient Have a Medical Advance Directive? No  Would patient like information on creating a medical advance directive? No - Patient declined   Chief Complaint  Patient presents with  . Annual Exam    CPE    HPI: Patient is a 67 y.o. female seen today for an annual physical exam.  BP elevated today.  HR low.  On lopressor 50mg  po bid, losartan/hctz 100/12.5mg .  Occasionally, she checks it at home and it's normal and it was 126 here yesterday.    No new medical concerns.    Has been to ortho about shoulders and knees.  She has an upcoming appt.  They are no better.  Has tears in both rotator cuff and biceps tendon on right.  Has not done therapy.    Had UTI and saw Dr. Eulas Post.    Sugar average and cholesterol both improved a bit since last check.  Reviewed with her as below.  LDL down to 72, TG remains elevated at 239.  Not able to exercise with knees and shoulders bothersome.  Weight stable at 157 but has trended up over the past year.  Shingrix is pending--she's going to call [employer] to find out about coverage.  Feels like she has congestion from sinus drainage.  Sometimes it's GERD and she takes a zantac or tums.  Uses omeprazole daily.  Discussed trying zyrtec again.    Also could try flonase.  Past Medical History:  Diagnosis Date  . GERD (gastroesophageal reflux disease)   . Hyperlipidemia   . Hypertension    Past Surgical History:    Procedure Laterality Date  . BIOPSY THYROID  2008  . TONSILECTOMY, ADENOIDECTOMY, BILATERAL MYRINGOTOMY AND TUBES Bilateral 1957    reports that  has never smoked. she has never used smokeless tobacco. She reports that she does not drink alcohol or use drugs.  Functional Status Survey:  independent  Family History  Problem Relation Age of Onset  . Hypertension Father   . Diabetes Father   . Coronary artery disease Father   . Hyperlipidemia Father   . Colon cancer Father   . Hypertension Mother   . Arthritis Mother     Health Maintenance  Topic Date Due  . MAMMOGRAM  08/30/2019  . TETANUS/TDAP  06/23/2022  . COLONOSCOPY  11/06/2026  . INFLUENZA VACCINE  Completed  . DEXA SCAN  Completed  . Hepatitis C Screening  Completed  . PNA vac Low Risk Adult  Completed    No Known Allergies  Outpatient Encounter Medications as of 09/12/2017  Medication Sig  . alendronate (FOSAMAX) 70 MG tablet TAKE 1 TABLET EVERY WEEK  . b complex vitamins tablet Take 1 tablet by mouth daily.  . Black Cohosh 40 MG CAPS Take 40 mg by mouth daily.  . Calcium Carbonate-Vit D-Min (CALCIUM 600 + MINERALS PO) Take by mouth 2 (two) times daily.  Marland Kitchen losartan-hydrochlorothiazide (HYZAAR) 100-12.5 MG tablet Take 1 tablet by mouth daily.  Marland Kitchen  metoprolol tartrate (LOPRESSOR) 50 MG tablet TAKE 1 TABLET TWICE DAILY  . Multiple Vitamin (MULTIVITAMIN) tablet Take 1 tablet by mouth 2 (two) times daily.   Marland Kitchen omeprazole (PRILOSEC) 20 MG capsule Take 1 capsule (20 mg total) by mouth daily.  Marland Kitchen OVER THE COUNTER MEDICATION Replenex - chondroitin, MSM, glucosamine, ginger, green tea -Take 2 pills in am and 1 pill at night  . OVER THE COUNTER MEDICATION daily. Phytomega - phytosterol esters 1000mg /fishoil 500mg /C&E -Take one pill twice a day  . Probiotic Product (PROBIOTIC DAILY PO) Take by mouth daily.  . simvastatin (ZOCOR) 20 MG tablet Take 1 tablet (20 mg total) by mouth daily.  . TURMERIC PO Take 1 tablet by mouth daily.    . vitamin C (ASCORBIC ACID) 500 MG tablet Take 500 mg by mouth daily.   Facility-Administered Encounter Medications as of 09/12/2017  Medication  . 0.9 %  sodium chloride infusion    Review of Systems  Constitutional: Negative for chills, fever and malaise/fatigue.  HENT: Positive for congestion. Negative for hearing loss.   Eyes: Negative for blurred vision.  Respiratory: Negative for cough and shortness of breath.   Cardiovascular: Negative for chest pain, palpitations and leg swelling.  Gastrointestinal: Positive for heartburn. Negative for abdominal pain, blood in stool, constipation, diarrhea, melena, nausea and vomiting.  Genitourinary: Negative for dysuria.  Musculoskeletal: Positive for joint pain. Negative for falls.  Neurological: Negative for dizziness, loss of consciousness and weakness.  Psychiatric/Behavioral: Negative for depression and memory loss. The patient is not nervous/anxious.     Vitals:   09/12/17 0913  BP: (!) 158/90  Pulse: (!) 50  Temp: 97.8 F (36.6 C)  TempSrc: Oral  SpO2: 95%  Weight: 157 lb (71.2 kg)  Height: 4\' 10"  (1.473 m)   Body mass index is 32.81 kg/m. Physical Exam  Constitutional: She is oriented to person, place, and time. She appears well-developed and well-nourished. No distress.  HENT:  Head: Normocephalic and atraumatic.  Right Ear: External ear normal.  Left Ear: External ear normal.  Nose: Nose normal.  Mouth/Throat: Oropharynx is clear and moist. No oropharyngeal exudate.  Eyes: Conjunctivae and EOM are normal. Pupils are equal, round, and reactive to light.  Neck: Normal range of motion. Neck supple. No JVD present. No thyromegaly present.  Cardiovascular: Normal rate, regular rhythm, normal heart sounds and intact distal pulses.  Pulmonary/Chest: Effort normal and breath sounds normal.  Abdominal: Soft. Bowel sounds are normal. She exhibits no distension. There is no tenderness.  Genitourinary:  Genitourinary Comments:  Breast exam and GU exam deferred (done by gyn)  Musculoskeletal: Normal range of motion. She exhibits tenderness.  Right lateral shoulder, left medial knee  Lymphadenopathy:    She has no cervical adenopathy.  Neurological: She is alert and oriented to person, place, and time.  Skin: Skin is warm and dry. Capillary refill takes less than 2 seconds.  Psychiatric: She has a normal mood and affect.    Labs reviewed: Basic Metabolic Panel: Recent Labs    02/26/17 1007 09/05/17 0947  NA 138 140  K 4.1 4.1  CL 106 104  CO2 20 31  GLUCOSE 94 103*  BUN 18 18  CREATININE 0.91 0.92  CALCIUM 9.1 9.2   Liver Function Tests: Recent Labs    02/26/17 1007 09/05/17 0947  AST 36* 25  ALT 27 28  ALKPHOS 23*  --   BILITOT 0.9 0.6  PROT 7.1 6.5  ALBUMIN 4.3  --    No results  for input(s): LIPASE, AMYLASE in the last 8760 hours. No results for input(s): AMMONIA in the last 8760 hours. CBC: Recent Labs    09/05/17 0947  WBC 6.8  NEUTROABS 3,414  HGB 14.7  HCT 42.2  MCV 95.7  PLT 305   Cardiac Enzymes: No results for input(s): CKTOTAL, CKMB, CKMBINDEX, TROPONINI in the last 8760 hours. BNP: Invalid input(s): POCBNP Lab Results  Component Value Date   HGBA1C 5.9 (H) 09/05/2017   Lab Results  Component Value Date   TSH 3.030 06/02/2015   Imaging and Procedures Recently: EKG today:  Sinus bradycardia at 49bpm Reviewed gyn and orthopedics notes, mammogram, AWV  Assessment/Plan ANNUAL EXAM -performed today -AWV reviewed from  RN  1. Essential hypertension, benign -bp high here today, but at goal at AWV, suspect white coat and salt intake -decrease salt intake and increase water intake - EKG 12-Lead - CBC with Differential/Platelet; Future - COMPLETE METABOLIC PANEL WITH GFR; Future  2. Hyperlipidemia, unspecified hyperlipidemia type -cont statin therapy, work on diet and exercise as weight trending up - Lipid panel; Future  3. Hyperglycemia -hba1c in prediabetic  range - CBC with Differential/Platelet; Future - COMPLETE METABOLIC PANEL WITH GFR; Future - Hemoglobin A1c; Future  4. Tear of right rotator cuff, unspecified tear extent - f/u with ortho, Dr Marlou Sa -try tid tylenol 500mg  - diclofenac sodium (VOLTAREN) 1 % GEL; 2 g to bilateral shoulders and 4 g to bilateral knees QID prn arthritis  Dispense: 100 g; Refill: 0  5. Labral tear of long head of biceps tendon, right, sequela -again try tylenol 500mg  tid - diclofenac sodium (VOLTAREN) 1 % GEL; 2 g to bilateral shoulders and 4 g to bilateral knees QID prn arthritis  Dispense: 100 g; Refill: 0 -f/u ortho  6. Primary osteoarthritis of both knees -try tylenol 500mg  po tid and add voltaren gel - diclofenac sodium (VOLTAREN) 1 % GEL; 2 g to bilateral shoulders and 4 g to bilateral knees QID prn arthritis  Dispense: 100 g; Refill: 0 -f/u ortho  7. Gastroesophageal reflux disease, esophagitis presence not specified -cont omeprazole, previously given diet for GERD, using some zantac as needed, too  8. Sinus drainage -add zyrtec for postnasal drip and flonase to see if these help  Labs/tests ordered: Orders Placed This Encounter  Procedures  . CBC with Differential/Platelet    Standing Status:   Future    Standing Expiration Date:   09/12/2018  . COMPLETE METABOLIC PANEL WITH GFR    Standing Status:   Future    Standing Expiration Date:   09/12/2018  . Hemoglobin A1c    Standing Status:   Future    Standing Expiration Date:   09/12/2018  . Lipid panel    Standing Status:   Future    Standing Expiration Date:   09/12/2018  . EKG 12-Lead   F/u 6 mos for med mgt, labs before  Joahan Swatzell L. Ilya Neely, D.O. [facility] [address], [place] [address] Cell Phone (Mon-Fri 8am-5pm):  [phone] On Call:  [phone] & follow prompts after 5pm & weekends Office Phone:  [phone] Office Fax:  [phone]

## 2017-09-12 NOTE — Patient Instructions (Addendum)
Try tylenol 500mg  by mouth three times a day for your arthritis pain.   Try voltaren gel for the joints, as well, up to 4 times per day. Decrease salt and increase water intake.

## 2017-11-04 ENCOUNTER — Ambulatory Visit (INDEPENDENT_AMBULATORY_CARE_PROVIDER_SITE_OTHER): Payer: Medicare HMO | Admitting: Orthopedic Surgery

## 2017-11-05 ENCOUNTER — Ambulatory Visit (INDEPENDENT_AMBULATORY_CARE_PROVIDER_SITE_OTHER): Payer: Medicare HMO | Admitting: Orthopedic Surgery

## 2017-11-05 ENCOUNTER — Encounter: Payer: Self-pay | Admitting: Nurse Practitioner

## 2017-11-05 ENCOUNTER — Telehealth: Payer: Self-pay

## 2017-11-05 ENCOUNTER — Ambulatory Visit (INDEPENDENT_AMBULATORY_CARE_PROVIDER_SITE_OTHER): Payer: Medicare HMO

## 2017-11-05 ENCOUNTER — Ambulatory Visit (INDEPENDENT_AMBULATORY_CARE_PROVIDER_SITE_OTHER): Payer: Medicare HMO | Admitting: Nurse Practitioner

## 2017-11-05 ENCOUNTER — Encounter (INDEPENDENT_AMBULATORY_CARE_PROVIDER_SITE_OTHER): Payer: Self-pay | Admitting: Orthopedic Surgery

## 2017-11-05 VITALS — BP 142/86 | HR 65 | Temp 98.4°F | Ht <= 58 in | Wt 160.0 lb

## 2017-11-05 DIAGNOSIS — M25561 Pain in right knee: Secondary | ICD-10-CM

## 2017-11-05 MED ORDER — LIDOCAINE HCL 1 % IJ SOLN
5.0000 mL | INTRAMUSCULAR | Status: AC | PRN
  Administered 2017-11-05: 5 mL

## 2017-11-05 MED ORDER — METHYLPREDNISOLONE ACETATE 40 MG/ML IJ SUSP
40.0000 mg | INTRAMUSCULAR | Status: AC | PRN
  Administered 2017-11-05: 40 mg via INTRA_ARTICULAR

## 2017-11-05 NOTE — Progress Notes (Signed)
Careteam: Patient Care Team: Gayland Curry, DO as PCP - General (Geriatric Medicine)  Advanced Directive information    No Known Allergies  Chief Complaint  Patient presents with  . Acute Visit    Pt is being seen due to right knee pain. Pt stated she mis-stepped yesterday afternoon and felt a pop and twist in knee. Pt cannot bare weight due to pain.      HPI: Patient is a 67 y.o. female seen in the office today due to knee pain. Yesterday when she was stepping off the bottom step she twisted her knee and she heard/felt a pop and then unable to bear weight after that. Had to get her husband to get her back up the hill. No swelling or bruising and able to move knee back and forth but unable to put any weight on knee without a lot of pain Has used ice last night and this morning. Pain 10/10 at the worst; took naproxen last night and was able to get some sleep but had a hard time getting comfortable in the bed Pain 5/10 when sitting.  Knee is really stiff and getting harder to bend  Review of Systems:  Review of Systems  Constitutional: Negative for chills, fever and malaise/fatigue.  Musculoskeletal: Positive for falls, joint pain and myalgias.  Neurological: Negative for weakness.    Past Medical History:  Diagnosis Date  . GERD (gastroesophageal reflux disease)   . Hyperlipidemia   . Hypertension    Past Surgical History:  Procedure Laterality Date  . BIOPSY THYROID  2008  . TONSILECTOMY, ADENOIDECTOMY, BILATERAL MYRINGOTOMY AND TUBES Bilateral 1957   Social History:   reports that  has never smoked. she has never used smokeless tobacco. She reports that she does not drink alcohol or use drugs.  Family History  Problem Relation Age of Onset  . Hypertension Father   . Diabetes Father   . Coronary artery disease Father   . Hyperlipidemia Father   . Colon cancer Father   . Hypertension Mother   . Arthritis Mother     Medications: Patient's Medications    New Prescriptions   No medications on file  Previous Medications   ALENDRONATE (FOSAMAX) 70 MG TABLET    TAKE 1 TABLET EVERY WEEK   B COMPLEX VITAMINS TABLET    Take 1 tablet by mouth daily.   BLACK COHOSH 40 MG CAPS    Take 40 mg by mouth daily.   CALCIUM CARBONATE-VIT D-MIN (CALCIUM 600 + MINERALS PO)    Take by mouth 2 (two) times daily.   DICLOFENAC SODIUM (VOLTAREN) 1 % GEL    2 g to bilateral shoulders and 4 g to bilateral knees QID prn arthritis   LOSARTAN-HYDROCHLOROTHIAZIDE (HYZAAR) 100-12.5 MG TABLET    Take 1 tablet by mouth daily.   METOPROLOL TARTRATE (LOPRESSOR) 50 MG TABLET    TAKE 1 TABLET TWICE DAILY   MULTIPLE VITAMIN (MULTIVITAMIN) TABLET    Take 1 tablet by mouth 2 (two) times daily.    OMEPRAZOLE (PRILOSEC) 20 MG CAPSULE    Take 1 capsule (20 mg total) by mouth daily.   OVER THE COUNTER MEDICATION    Replenex - chondroitin, MSM, glucosamine, ginger, green tea -Take 2 pills in am and 1 pill at night   OVER THE COUNTER MEDICATION    daily. Phytomega - phytosterol esters 1000mg /fishoil 500mg /C&E -Take one pill twice a day   PROBIOTIC PRODUCT (PROBIOTIC DAILY PO)    Take by mouth  daily.   SIMVASTATIN (ZOCOR) 20 MG TABLET    Take 1 tablet (20 mg total) by mouth daily.   TURMERIC PO    Take 1 tablet by mouth daily.    VITAMIN C (ASCORBIC ACID) 500 MG TABLET    Take 500 mg by mouth daily.  Modified Medications   No medications on file  Discontinued Medications   No medications on file     Physical Exam:  Vitals:   11/05/17 0944  BP: (!) 142/86  Pulse: 65  Temp: 98.4 F (36.9 C)  SpO2: 99%  Weight: 160 lb (72.6 kg)  Height: 4\' 10"  (1.473 m)   Body mass index is 33.44 kg/m.  Physical Exam  Constitutional: She appears well-developed and well-nourished.  Cardiovascular: Normal rate, regular rhythm and normal heart sounds.  Pulmonary/Chest: Effort normal and breath sounds normal.  Musculoskeletal:       Right knee: She exhibits abnormal meniscus. She exhibits  normal range of motion, no swelling, no effusion and no ecchymosis. Tenderness found. Medial joint line and MCL tenderness noted. No lateral joint line, no LCL and no patellar tendon tenderness noted.    Labs reviewed: Basic Metabolic Panel: Recent Labs    02/26/17 1007 09/05/17 0947  NA 138 140  K 4.1 4.1  CL 106 104  CO2 20 31  GLUCOSE 94 103*  BUN 18 18  CREATININE 0.91 0.92  CALCIUM 9.1 9.2   Liver Function Tests: Recent Labs    02/26/17 1007 09/05/17 0947  AST 36* 25  ALT 27 28  ALKPHOS 23*  --   BILITOT 0.9 0.6  PROT 7.1 6.5  ALBUMIN 4.3  --    No results for input(s): LIPASE, AMYLASE in the last 8760 hours. No results for input(s): AMMONIA in the last 8760 hours. CBC: Recent Labs    09/05/17 0947  WBC 6.8  NEUTROABS 3,414  HGB 14.7  HCT 42.2  MCV 95.7  PLT 305   Lipid Panel: Recent Labs    02/26/17 1007 09/05/17 0947  CHOL 157 148  HDL 42* 43*  LDLCALC 64  --   TRIG 255* 239*  CHOLHDL 3.7 3.4   TSH: No results for input(s): TSH in the last 8760 hours. A1C: Lab Results  Component Value Date   HGBA1C 5.9 (H) 09/05/2017     Assessment/Plan 1. Acute pain of right knee -highly suspicious for medial medial meniscus tear after feeling a pop after a twisting knee injury and now 8-10/10 pain with weight.  -pt already established at [facility] and appt made for 1130 with Dr Sharol Given today for further evaluation.   Carlos American. Harle Battiest  [facility] [phone] 8 am - 5 pm) [phone] (after hours)

## 2017-11-05 NOTE — Progress Notes (Signed)
Office Visit Note   Patient: Melissa Simpson           Date of Birth: 16-Dec-1950           MRN: [id number] Visit Date: 11/05/2017              Requested by: Gayland Curry, DO [place], [place] [address] PCP: Gayland Curry, DO  Chief Complaint  Patient presents with  . Right Knee - Pain      HPI: Patient is a 67 year old woman who is seen for acute pain right knee.  She states she stepped wrong yesterday afternoon and twisted her knee she had acute pain of the medial joint line.  Assessment & Plan: Visit Diagnoses:  1. Acute pain of right knee     Plan: The right knee was injected from the anterior medial portal she tolerated this well patient will follow-up as scheduled with Dr. Marlou Sa.  Patient may require an MRI scan to further evaluate her medial joint line pathology.  Follow-Up Instructions: Return in about 1 week (around 11/12/2017).   Ortho Exam  Patient is alert, oriented, no adenopathy, well-dressed, normal affect, normal respiratory effort. Examination patient has an antalgic gait.  She does have a mild effusion of the right knee.  Her collateral ligaments and cruciate ligaments are stable however she does have pain along the MCL with valgus stressing.  The origin and insertion of the MCL are nontender to palpation she is point tender to palpation along the medial joint line.  Lateral joint line is nontender to palpation.  Imaging: Xr Knee 1-2 Views Right  Result Date: 11/05/2017 2 view radiographs of the right knee shows some medial joint space narrowing she has increased joint space narrowing in the left knee compared to the right.  No evidence of fracture.  No subcondylar cysts.  No images are attached to the encounter.  Labs: Lab Results  Component Value Date   HGBA1C 5.9 (H) 09/05/2017   HGBA1C 6.0 (H) 02/29/2016   HGBA1C 6.0 (H) 06/02/2015    @LABSALLVALUES (HGBA1)@  There is no height or weight on file to calculate BMI.  Orders:  Orders  Placed This Encounter  Procedures  . Large Joint Inj  . XR Knee 1-2 Views Right   No orders of the defined types were placed in this encounter.    Procedures: Large Joint Inj: R knee on 11/05/2017 12:21 PM Indications: pain and diagnostic evaluation Details: 22 G 1.5 in needle, anteromedial approach  Arthrogram: No  Medications: 5 mL lidocaine 1 %; 40 mg methylPREDNISolone acetate 40 MG/ML Outcome: tolerated well, no immediate complications Procedure, treatment alternatives, risks and benefits explained, specific risks discussed. Consent was given by the patient. Immediately prior to procedure a time out was called to verify the correct patient, procedure, equipment, support staff and site/side marked as required. Patient was prepped and draped in the usual sterile fashion.      Clinical Data: No additional findings.  ROS:  All other systems negative, except as noted in the HPI. Review of Systems  Objective: Vital Signs: There were no vitals taken for this visit.  Specialty Comments:  No specialty comments available.  PMFS History: Patient Active Problem List   Diagnosis Date Noted  . Essential hypertension, benign 02/28/2017  . Left medial knee pain 02/28/2017  . Elevated liver function tests 02/27/2016  . Hyperlipidemia 02/27/2016  . Right shoulder pain 02/27/2016  . Chronic right hip pain 02/27/2016  . Foot  pain, left 02/27/2016  . Caregiver stress 02/27/2016  . Family history of colon cancer 06/02/2015  . Esophageal reflux 06/02/2015  . Chronic maxillary sinusitis 06/02/2015  . Left thyroid nodule 06/02/2015  . Chest pain 12/18/2013  . Dyslipidemia 12/18/2013  . HTN (hypertension) 12/18/2013   Past Medical History:  Diagnosis Date  . GERD (gastroesophageal reflux disease)   . Hyperlipidemia   . Hypertension     Family History  Problem Relation Age of Onset  . Hypertension Father   . Diabetes Father   . Coronary artery disease Father   .  Hyperlipidemia Father   . Colon cancer Father   . Hypertension Mother   . Arthritis Mother     Past Surgical History:  Procedure Laterality Date  . BIOPSY THYROID  2008  . TONSILECTOMY, ADENOIDECTOMY, BILATERAL MYRINGOTOMY AND TUBES Bilateral 1957   Social History   Occupational History  . Not on file  Tobacco Use  . Smoking status: Never Smoker  . Smokeless tobacco: Never Used  Substance and Sexual Activity  . Alcohol use: No  . Drug use: No  . Sexual activity: Not on file

## 2017-11-05 NOTE — Telephone Encounter (Signed)
I called [facility] to see about getting patient seen today due to acute knee injury yesterday afternoon. I was told that patient's doctor, Dr. Marlou Sa, did not have any appointments available. Patient was scheduled with Dr. Sharol Given at 11:30 this morning. Patient verbalized agreement.

## 2017-11-11 ENCOUNTER — Encounter (INDEPENDENT_AMBULATORY_CARE_PROVIDER_SITE_OTHER): Payer: Self-pay | Admitting: Orthopedic Surgery

## 2017-11-11 ENCOUNTER — Ambulatory Visit (INDEPENDENT_AMBULATORY_CARE_PROVIDER_SITE_OTHER): Payer: Medicare HMO | Admitting: Orthopedic Surgery

## 2017-11-11 DIAGNOSIS — M25511 Pain in right shoulder: Secondary | ICD-10-CM

## 2017-11-11 DIAGNOSIS — G8929 Other chronic pain: Secondary | ICD-10-CM | POA: Diagnosis not present

## 2017-11-11 DIAGNOSIS — M25561 Pain in right knee: Secondary | ICD-10-CM | POA: Diagnosis not present

## 2017-11-11 NOTE — Progress Notes (Signed)
Office Visit Note   Patient: Melissa Simpson           Date of Birth: 1951/06/08           MRN: [id number] Visit Date: 11/11/2017 Requested by: Gayland Curry, DO [place], [place] [address] PCP: Gayland Curry, DO  Subjective: Chief Complaint  Patient presents with  . Right Shoulder - Follow-up  . Left Knee - Pain, Follow-up  . Right Knee - Pain    HPI: [name] is a patient with bilateral knee pain and right shoulder pain.  I reviewed her records.  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  She is doing reasonably well with that right shoulder in terms of function and pain.  She states that she injured her right knee last Monday but is gotten better.  She had an injection by Dr. Sharol Given 11/05/2017.  Radiographically her left knee is the more symptomatic arthritic side.  She takes occasional anti-inflammatories for the problem.  Not having any more mechanical symptoms in the right knee.              ROS: All systems reviewed are negative as they relate to the chief complaint within the history of present illness.  Patient denies  fevers or chills.   Assessment & Plan: Visit Diagnoses:  1. Acute pain of right knee   2. Chronic right shoulder pain     Plan: Impression is bilateral knee arthritis left worse than right radiographically but the right was more symptomatic now.  She also has right shoulder early rotator cuff arthropathy which is fairly minimally symptomatic at this time.  What I would like to do is to have her take some anti-inflammatories like Aleve 1 twice a day for the next 5 days.  See if that can get her knee back to baseline.  If not then I would like for her to call Lauren in 2 weeks and we can set her up for gel injections into both knees.  I will see her back as needed.  Follow-Up Instructions: Return if symptoms worsen or fail to improve.   Orders:  No orders of the defined types were placed in  this encounter.  No orders of the defined types were placed in this encounter.     Procedures: No procedures performed   Clinical Data: No additional findings.  Objective: Vital Signs: There were no vitals taken for this visit.  Physical Exam:   Constitutional: Patient appears well-developed HEENT:  Head: Normocephalic Eyes:EOM are normal Neck: Normal range of motion Cardiovascular: Normal rate Pulmonary/chest: Effort normal Neurologic: Patient is alert Skin: Skin is warm Psychiatric: Patient has normal mood and affect    Ortho Exam: Orthopedic exam demonstrates full active and passive range of motion of both knees with no effusion in either knee.  Negative patellar apprehension or extensor mechanism tenderness.  Pedal pulses palpable.  No groin pain with internal/external rotation of the leg.  No other masses lymph adenopathy or skin changes noted in the bilateral knee or right shoulder region.  Right shoulder actually has pretty good passive range of motion and strength without much in the way of grinding or crepitus.  Specialty Comments:  No specialty comments available.  Imaging: No results found.   PMFS History: Patient Active Problem List   Diagnosis Date Noted  . Essential hypertension, benign 02/28/2017  . Left medial knee pain 02/28/2017  . Elevated liver function  tests 02/27/2016  . Hyperlipidemia 02/27/2016  . Right shoulder pain 02/27/2016  . Chronic right hip pain 02/27/2016  . Foot pain, left 02/27/2016  . Caregiver stress 02/27/2016  . Family history of colon cancer 06/02/2015  . Esophageal reflux 06/02/2015  . Chronic maxillary sinusitis 06/02/2015  . Left thyroid nodule 06/02/2015  . Chest pain 12/18/2013  . Dyslipidemia 12/18/2013  . HTN (hypertension) 12/18/2013   Past Medical History:  Diagnosis Date  . GERD (gastroesophageal reflux disease)   . Hyperlipidemia   . Hypertension     Family History  Problem Relation Age of Onset  .  Hypertension Father   . Diabetes Father   . Coronary artery disease Father   . Hyperlipidemia Father   . Colon cancer Father   . Hypertension Mother   . Arthritis Mother     Past Surgical History:  Procedure Laterality Date  . BIOPSY THYROID  2008  . TONSILECTOMY, ADENOIDECTOMY, BILATERAL MYRINGOTOMY AND TUBES Bilateral 1957   Social History   Occupational History  . Not on file  Tobacco Use  . Smoking status: Never Smoker  . Smokeless tobacco: Never Used  Substance and Sexual Activity  . Alcohol use: No  . Drug use: No  . Sexual activity: Not on file

## 2017-12-03 ENCOUNTER — Ambulatory Visit (INDEPENDENT_AMBULATORY_CARE_PROVIDER_SITE_OTHER): Payer: Medicare HMO | Admitting: Orthopedic Surgery

## 2017-12-03 ENCOUNTER — Telehealth (INDEPENDENT_AMBULATORY_CARE_PROVIDER_SITE_OTHER): Payer: Self-pay

## 2017-12-03 NOTE — Telephone Encounter (Signed)
Submitted application online for Monovisc Inj., bilateral knee.

## 2017-12-03 NOTE — Telephone Encounter (Signed)
Melissa Simpson, Melissa Simpson, RMA  Mariel Gaudin, RT        This pt is on the sch for Dr. 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 We saw her as a work in for acute knee pain and Dr. Marlou Sa saw her after that. In his note he said that she would call and discuss possible gel injections and the appt is she with the note " pt wants injections" I do not know if you know anything about this but wanted to give you a heads up.    I received this message so I called patient to let her know based off her last OV with Dr Marlou Sa on 11/11/17 she should call us when she was ready to proceed with gel injections. She did not answer. I LM  For her advising would not be able to get gel injections today without prior authorization from her insurance for this. I advised her to call me back and let me know how she wishes to proceed. Can reschedule her appt if needed.

## 2017-12-04 ENCOUNTER — Telehealth (INDEPENDENT_AMBULATORY_CARE_PROVIDER_SITE_OTHER): Payer: Self-pay

## 2017-12-04 NOTE — Telephone Encounter (Signed)
Talked with patient and advised her that she is covered for Monovisc Inj., Bilateral Knee  Covered at 100% [employer] May have a co-pay of $35.00 Appt.scheduled 12/16/17

## 2017-12-16 ENCOUNTER — Encounter (INDEPENDENT_AMBULATORY_CARE_PROVIDER_SITE_OTHER): Payer: Self-pay | Admitting: Orthopedic Surgery

## 2017-12-16 ENCOUNTER — Ambulatory Visit (INDEPENDENT_AMBULATORY_CARE_PROVIDER_SITE_OTHER): Payer: Medicare HMO | Admitting: Orthopedic Surgery

## 2017-12-16 DIAGNOSIS — M1711 Unilateral primary osteoarthritis, right knee: Secondary | ICD-10-CM

## 2017-12-16 DIAGNOSIS — M1712 Unilateral primary osteoarthritis, left knee: Secondary | ICD-10-CM

## 2017-12-19 ENCOUNTER — Encounter (INDEPENDENT_AMBULATORY_CARE_PROVIDER_SITE_OTHER): Payer: Self-pay | Admitting: Orthopedic Surgery

## 2017-12-19 DIAGNOSIS — M1711 Unilateral primary osteoarthritis, right knee: Secondary | ICD-10-CM | POA: Diagnosis not present

## 2017-12-19 DIAGNOSIS — M1712 Unilateral primary osteoarthritis, left knee: Secondary | ICD-10-CM

## 2017-12-19 MED ORDER — HYALURONAN 88 MG/4ML IX SOSY
88.0000 mg | PREFILLED_SYRINGE | INTRA_ARTICULAR | Status: AC | PRN
  Administered 2017-12-19: 88 mg via INTRA_ARTICULAR

## 2017-12-19 MED ORDER — LIDOCAINE HCL 1 % IJ SOLN
5.0000 mL | INTRAMUSCULAR | Status: AC | PRN
  Administered 2017-12-19: 5 mL

## 2017-12-19 NOTE — Progress Notes (Signed)
   Procedure Note  Patient: Melissa Simpson             Date of Birth: 10-21-1950           MRN: [id number]             Visit Date: 12/16/2017  Procedures: Visit Diagnoses: Unilateral primary osteoarthritis, left knee  Unilateral primary osteoarthritis, right knee  Large Joint Inj: bilateral knee on 12/19/2017 5:54 PM Indications: diagnostic evaluation, joint swelling and pain Details: 18 G 1.5 in needle, superolateral approach  Arthrogram: No  Medications (Right): 5 mL lidocaine 1 %; 88 mg Hyaluronan 88 MG/4ML Medications (Left): 5 mL lidocaine 1 %; 88 mg Hyaluronan 88 MG/4ML Outcome: tolerated well, no immediate complications Procedure, treatment alternatives, risks and benefits explained, specific risks discussed. Consent was given by the patient. Immediately prior to procedure a time out was called to verify the correct patient, procedure, equipment, support staff and site/side marked as required. Patient was prepped and draped in the usual sterile fashion.

## 2018-03-12 ENCOUNTER — Other Ambulatory Visit: Payer: Medicare HMO

## 2018-03-12 DIAGNOSIS — R739 Hyperglycemia, unspecified: Secondary | ICD-10-CM

## 2018-03-12 DIAGNOSIS — I10 Essential (primary) hypertension: Secondary | ICD-10-CM | POA: Diagnosis not present

## 2018-03-12 DIAGNOSIS — E785 Hyperlipidemia, unspecified: Secondary | ICD-10-CM | POA: Diagnosis not present

## 2018-03-12 DIAGNOSIS — W57XXXA Bitten or stung by nonvenomous insect and other nonvenomous arthropods, initial encounter: Secondary | ICD-10-CM | POA: Diagnosis not present

## 2018-03-17 ENCOUNTER — Encounter: Payer: Self-pay | Admitting: Internal Medicine

## 2018-03-17 ENCOUNTER — Ambulatory Visit (INDEPENDENT_AMBULATORY_CARE_PROVIDER_SITE_OTHER): Payer: Medicare HMO | Admitting: Internal Medicine

## 2018-03-17 VITALS — BP 118/70 | HR 63 | Temp 98.3°F | Ht <= 58 in | Wt 141.0 lb

## 2018-03-17 DIAGNOSIS — R059 Cough, unspecified: Secondary | ICD-10-CM

## 2018-03-17 DIAGNOSIS — R739 Hyperglycemia, unspecified: Secondary | ICD-10-CM | POA: Diagnosis not present

## 2018-03-17 DIAGNOSIS — E785 Hyperlipidemia, unspecified: Secondary | ICD-10-CM

## 2018-03-17 DIAGNOSIS — W57XXXA Bitten or stung by nonvenomous insect and other nonvenomous arthropods, initial encounter: Secondary | ICD-10-CM

## 2018-03-17 DIAGNOSIS — R05 Cough: Secondary | ICD-10-CM | POA: Diagnosis not present

## 2018-03-17 DIAGNOSIS — R102 Pelvic and perineal pain: Secondary | ICD-10-CM

## 2018-03-17 DIAGNOSIS — R630 Anorexia: Secondary | ICD-10-CM | POA: Diagnosis not present

## 2018-03-17 DIAGNOSIS — I10 Essential (primary) hypertension: Secondary | ICD-10-CM

## 2018-03-17 NOTE — Progress Notes (Signed)
Location:  [facility] clinic Provider:  Adiel Mcnamara L. Mariea Clonts, D.O., C.M.D.  Goals of Care:  Advanced Directives 03/17/2018  Does Patient Have a Medical Advance Directive? No  Would patient like information on creating a medical advance directive? No - Patient declined   Chief Complaint  Patient presents with  . Medical Management of Chronic Issues    54mth follow-up    HPI: Patient is a 67 y.o. female seen today for medical management of chronic diseases.    Says not much new.  Had bilateral knee injections with Dr. Marlou Sa.  Still painful left knee.  Not using anything on the regular for her pain.   Shoulder better now.    Admits she may not have been well enough hydrated--not drinking 8 8oz glasses of water. WBC slightly elevated.  She had a tick bite on her right posterior shoulder in April or May and itchy since.  Since then, she's had other bumps break out on her legs in clusters on her legs.    Then she got a wasp sting on the back of her left leg.    A few weeks ago, in the middle of the night, she'd wake up thinking she had to go to the bathroom, then had pain in her bladder, would get up to go and it was hard to get it started.  She can go, but it's an effort.  She has to get up once a night between 3-5am due to the pain.  Goes, then goes back to sleep.  Says some increased urgency through the day, too.  Stomach feels nauseous a lot.  She's been on a keto diet.  Appetite is down.  Lost 19 lbs.  Doesn't feel like a normal UTI.  No changes in appearance of urine. Hasn't really been looking.    Also has a cough.  Usually feels congestion. Thinks it is sinus drainage in the back.  Not going away.  Also been a few weeks.  Will sometimes cough until she throws up something--had it in the past.    Has had a headache nearly nightly.  Worse with cough.  Has some chest pain after coughing that lasts seconds and soreness over the chest.  Past Medical History:  Diagnosis Date  . GERD  (gastroesophageal reflux disease)   . Hyperlipidemia   . Hypertension     Past Surgical History:  Procedure Laterality Date  . BIOPSY THYROID  2008  . TONSILECTOMY, ADENOIDECTOMY, BILATERAL MYRINGOTOMY AND TUBES Bilateral 1957    No Known Allergies  Outpatient Encounter Medications as of 03/17/2018  Medication Sig  . alendronate (FOSAMAX) 70 MG tablet TAKE 1 TABLET EVERY WEEK  . b complex vitamins tablet Take 1 tablet by mouth daily.  . Black Cohosh 40 MG CAPS Take 40 mg by mouth daily.  . Calcium Carbonate-Vit D-Min (CALCIUM 600 + MINERALS PO) Take by mouth 2 (two) times daily.  . diclofenac sodium (VOLTAREN) 1 % GEL 2 g to bilateral shoulders and 4 g to bilateral knees QID prn arthritis  . losartan-hydrochlorothiazide (HYZAAR) 100-12.5 MG tablet Take 1 tablet by mouth daily.  . metoprolol tartrate (LOPRESSOR) 50 MG tablet TAKE 1 TABLET TWICE DAILY  . Multiple Vitamin (MULTIVITAMIN) tablet Take 1 tablet by mouth 2 (two) times daily.   Marland Kitchen omeprazole (PRILOSEC) 20 MG capsule Take 1 capsule (20 mg total) by mouth daily.  Marland Kitchen OVER THE COUNTER MEDICATION Replenex - chondroitin, MSM, glucosamine, ginger, green tea -Take 2 pills in am and 1  pill at night  . OVER THE COUNTER MEDICATION daily. Phytomega - phytosterol esters 1000mg /fishoil 500mg /C&E -Take one pill twice a day  . Probiotic Product (PROBIOTIC DAILY PO) Take by mouth daily.  . simvastatin (ZOCOR) 20 MG tablet Take 1 tablet (20 mg total) by mouth daily.  . TURMERIC PO Take 1 tablet by mouth daily.   . vitamin C (ASCORBIC ACID) 500 MG tablet Take 500 mg by mouth daily.   Facility-Administered Encounter Medications as of 03/17/2018  Medication  . 0.9 %  sodium chloride infusion    Review of Systems:  Review of Systems  Constitutional: Positive for weight loss. Negative for chills and fever.  HENT: Positive for congestion.   Eyes: Negative for blurred vision.  Respiratory: Positive for cough. Negative for shortness of breath.     Cardiovascular: Negative for chest pain, palpitations and leg swelling.  Gastrointestinal: Positive for abdominal pain. Negative for blood in stool, constipation, diarrhea, heartburn, melena, nausea and vomiting.       Loss of appetite  Genitourinary: Positive for frequency. Negative for dysuria, flank pain, hematuria and urgency.       Suprapubic pain  Musculoskeletal: Positive for joint pain and myalgias. Negative for falls.  Skin: Negative for itching and rash.  Neurological: Negative for dizziness and loss of consciousness.  Endo/Heme/Allergies: Positive for environmental allergies. Does not bruise/bleed easily.  Psychiatric/Behavioral: Negative for depression and memory loss. The patient is not nervous/anxious and does not have insomnia.     Health Maintenance  Topic Date Due  . INFLUENZA VACCINE  04/10/2018  . MAMMOGRAM  08/29/2019  . TETANUS/TDAP  06/23/2022  . COLONOSCOPY  11/06/2026  . DEXA SCAN  Completed  . Hepatitis C Screening  Completed  . PNA vac Low Risk Adult  Completed    Physical Exam: Vitals:   03/17/18 1000  BP: 118/70  Pulse: 63  Temp: 98.3 F (36.8 C)  TempSrc: Oral  SpO2: 96%  Weight: 141 lb (64 kg)  Height: 4\' 10"  (1.473 m)   Body mass index is 29.47 kg/m. Physical Exam  Constitutional: She is oriented to person, place, and time. She appears well-developed and well-nourished. No distress.  HENT:  Head: Normocephalic and atraumatic.  Eyes:  glasses  Cardiovascular: Normal rate, regular rhythm, normal heart sounds and intact distal pulses.  Pulmonary/Chest: Effort normal and breath sounds normal. No respiratory distress.  Abdominal: Soft. Bowel sounds are normal. She exhibits no distension and no mass. There is tenderness. There is no rebound and no guarding.  Over bladder and right lower quadrant  Musculoskeletal: Normal range of motion.  Neurological: She is alert and oriented to person, place, and time.  Skin: Skin is warm and dry.  Capillary refill takes less than 2 seconds.  Psychiatric: She has a normal mood and affect.    Labs reviewed: Basic Metabolic Panel: Recent Labs    09/05/17 0947 03/12/18 0906  NA 140 137  K 4.1 4.3  CL 104 100  CO2 31 26  GLUCOSE 103* 101*  BUN 18 17  CREATININE 0.92 1.11*  CALCIUM 9.2 9.3   Liver Function Tests: Recent Labs    09/05/17 0947 03/12/18 0906  AST 25 18  ALT 28 12  BILITOT 0.6 0.7  PROT 6.5 7.0   No results for input(s): LIPASE, AMYLASE in the last 8760 hours. No results for input(s): AMMONIA in the last 8760 hours. CBC: Recent Labs    09/05/17 0947 03/12/18 0906  WBC 6.8 10.9*  NEUTROABS 3,414 8,121*  HGB 14.7 12.9  HCT 42.2 38.3  MCV 95.7 96.0  PLT 305 540*   Lipid Panel: Recent Labs    09/05/17 0947 03/12/18 0906  CHOL 148 137  HDL 43* 34*  LDLCALC 72 77  TRIG 239* 159*  CHOLHDL 3.4 4.0   Lab Results  Component Value Date   HGBA1C 5.9 (H) 03/12/2018    Assessment/Plan 1. Suprapubic pain - new but over weeks with some increased frequency and difficulty starting to urinate -will r/o UTI, cmp was normal except bump in kidneys, so will also help to see micro from UA  - Urinalysis, Routine w reflex microscopic - Urine Culture - CBC with Differential/Platelet; Future  2. Tick bite, initial encounter - several months ago, but having other spots pop up since so suspect perhaps later in disease, will check abs - B. Burgdorfi Antibodies by WB - CBC with Differential/Platelet; Future  3. Cough - seems allergic/sinusitis - CBC with Differential/Platelet; Future  4. Loss of appetite - after she started keto diet then started eating whatever recently, suspect related to these dietary changes or possibly UTI - CBC with Differential/Platelet; Future  5. Hyperglycemia -improved with weight loss - Hemoglobin A1c; Future  6. Hyperlipidemia, unspecified hyperlipidemia type -also improved with weight loss - Lipid panel; Future  7.  Essential hypertension, benign -bp at goal with weight loss and same meds - CBC with Differential/Platelet; Future - COMPLETE METABOLIC PANEL WITH GFR; Future  Labs/tests ordered:   Orders Placed This Encounter  Procedures  . Urine Culture  . Urinalysis, Routine w reflex microscopic  . B. Burgdorfi Antibodies by WB  . CBC with Differential/Platelet    Standing Status:   Future    Standing Expiration Date:   03/18/2019  . COMPLETE METABOLIC PANEL WITH GFR    Standing Status:   Future    Standing Expiration Date:   03/18/2019  . Hemoglobin A1c    Standing Status:   Future    Standing Expiration Date:   03/18/2019  . Lipid panel    Standing Status:   Future    Standing Expiration Date:   03/18/2019    Next appt: 6 mos CPE with labs before  Adryen Cookson L. Emmaly Leech, D.O. [facility] [address], [place] [address] Cell Phone (Mon-Fri 8am-5pm):  [phone] On Call:  [phone] & follow prompts after 5pm & weekends Office Phone:  [phone] Office Fax:  [phone]

## 2018-03-17 NOTE — Patient Instructions (Signed)
We will call you with your lab results. In the meantime, increase your water intake to 8 8oz glasses of water each day. Congratulations on your weight loss.

## 2018-03-19 LAB — COMPLETE METABOLIC PANEL WITH GFR
AG Ratio: 1.2 (calc) (ref 1.0–2.5)
ALT: 12 U/L (ref 6–29)
AST: 18 U/L (ref 10–35)
Albumin: 3.8 g/dL (ref 3.6–5.1)
Alkaline phosphatase (APISO): 36 U/L (ref 33–130)
BUN/Creatinine Ratio: 15 (calc) (ref 6–22)
BUN: 17 mg/dL (ref 7–25)
CO2: 26 mmol/L (ref 20–32)
Calcium: 9.3 mg/dL (ref 8.6–10.4)
Chloride: 100 mmol/L (ref 98–110)
Creat: 1.11 mg/dL — ABNORMAL HIGH (ref 0.50–0.99)
GFR, Est African American: 59 mL/min/{1.73_m2} — ABNORMAL LOW (ref >=60)
GFR, Est Non African American: 51 mL/min/{1.73_m2} — ABNORMAL LOW (ref >=60)
Globulin: 3.2 g/dL (calc) (ref 1.9–3.7)
Glucose, Bld: 101 mg/dL — ABNORMAL HIGH (ref 65–99)
Potassium: 4.3 mmol/L (ref 3.5–5.3)
Sodium: 137 mmol/L (ref 135–146)
Total Bilirubin: 0.7 mg/dL (ref 0.2–1.2)
Total Protein: 7 g/dL (ref 6.1–8.1)

## 2018-03-19 LAB — CBC WITH DIFFERENTIAL/PLATELET
Basophils Absolute: 65 cells/uL (ref 0–200)
Basophils Relative: 0.6 %
Eosinophils Absolute: 153 cells/uL (ref 15–500)
Eosinophils Relative: 1.4 %
HCT: 38.3 % (ref 35.0–45.0)
Hemoglobin: 12.9 g/dL (ref 11.7–15.5)
Lymphs Abs: 1570 cells/uL (ref 850–3900)
MCH: 32.3 pg (ref 27.0–33.0)
MCHC: 33.7 g/dL (ref 32.0–36.0)
MCV: 96 fL (ref 80.0–100.0)
MPV: 8.9 fL (ref 7.5–12.5)
Monocytes Relative: 9.1 %
Neutro Abs: 8121 cells/uL — ABNORMAL HIGH (ref 1500–7800)
Neutrophils Relative %: 74.5 %
Platelets: 540 10*3/uL — ABNORMAL HIGH (ref 140–400)
RBC: 3.99 10*6/uL (ref 3.80–5.10)
RDW: 11.5 % (ref 11.0–15.0)
Total Lymphocyte: 14.4 %
WBC mixed population: 992 cells/uL — ABNORMAL HIGH (ref 200–950)
WBC: 10.9 10*3/uL — ABNORMAL HIGH (ref 3.8–10.8)

## 2018-03-19 LAB — URINE CULTURE
MICRO NUMBER:: 90810662
SPECIMEN QUALITY:: ADEQUATE

## 2018-03-19 LAB — B. BURGDORFI ANTIBODIES BY WB
B burgdorferi IgG Abs (IB): NEGATIVE
B burgdorferi IgM Abs (IB): NEGATIVE
Lyme Disease 18 kD IgG: NONREACTIVE
Lyme Disease 23 kD IgG: NONREACTIVE
Lyme Disease 23 kD IgM: NONREACTIVE
Lyme Disease 28 kD IgG: NONREACTIVE
Lyme Disease 30 kD IgG: NONREACTIVE
Lyme Disease 39 kD IgG: NONREACTIVE
Lyme Disease 39 kD IgM: NONREACTIVE
Lyme Disease 41 kD IgG: REACTIVE — AB
Lyme Disease 41 kD IgM: NONREACTIVE
Lyme Disease 45 kD IgG: NONREACTIVE
Lyme Disease 58 kD IgG: REACTIVE — AB
Lyme Disease 66 kD IgG: REACTIVE — AB
Lyme Disease 93 kD IgG: NONREACTIVE

## 2018-03-19 LAB — URINALYSIS, ROUTINE W REFLEX MICROSCOPIC
Bacteria, UA: NONE SEEN /HPF
Bilirubin Urine: NEGATIVE
Glucose, UA: NEGATIVE
Hgb urine dipstick: NEGATIVE
Hyaline Cast: NONE SEEN /LPF
Ketones, ur: NEGATIVE
Nitrite: NEGATIVE
Protein, ur: NEGATIVE
Specific Gravity, Urine: 1.019 (ref 1.001–1.03)
pH: 6.5 (ref 5.0–8.0)

## 2018-03-19 LAB — LIPID PANEL
Cholesterol: 137 mg/dL (ref <200)
HDL: 34 mg/dL — ABNORMAL LOW (ref >50)
LDL Cholesterol (Calc): 77 mg/dL (calc)
Non-HDL Cholesterol (Calc): 103 mg/dL (calc) (ref <130)
Total CHOL/HDL Ratio: 4 (calc) (ref <5.0)
Triglycerides: 159 mg/dL — ABNORMAL HIGH (ref <150)

## 2018-03-19 LAB — TEST AUTHORIZATION

## 2018-03-19 LAB — HEMOGLOBIN A1C
Hgb A1c MFr Bld: 5.9 % of total Hgb — ABNORMAL HIGH (ref <5.7)
Mean Plasma Glucose: 123 (calc)
eAG (mmol/L): 6.8 (calc)

## 2018-04-10 ENCOUNTER — Ambulatory Visit (HOSPITAL_COMMUNITY): Payer: Medicare HMO

## 2018-04-10 ENCOUNTER — Ambulatory Visit (INDEPENDENT_AMBULATORY_CARE_PROVIDER_SITE_OTHER): Payer: Medicare HMO | Admitting: Family

## 2018-04-10 ENCOUNTER — Encounter: Payer: Self-pay | Admitting: Family

## 2018-04-10 VITALS — BP 122/70 | HR 64 | Temp 98.1°F | Resp 18 | Ht <= 58 in | Wt 139.0 lb

## 2018-04-10 DIAGNOSIS — G4483 Primary cough headache: Secondary | ICD-10-CM | POA: Diagnosis not present

## 2018-04-10 DIAGNOSIS — R0989 Other specified symptoms and signs involving the circulatory and respiratory systems: Secondary | ICD-10-CM

## 2018-04-10 DIAGNOSIS — R1030 Lower abdominal pain, unspecified: Secondary | ICD-10-CM | POA: Diagnosis not present

## 2018-04-10 MED ORDER — CETIRIZINE HCL 10 MG PO TABS: 10.0000 mg | ORAL_TABLET | Freq: Every day | ORAL | 11 refills | Status: DC

## 2018-04-10 NOTE — Progress Notes (Signed)
Provider: Dinah Ngetich FNP-C  Gayland Curry, DO  Patient Care Team: Gayland Curry, DO as PCP - General (Geriatric Medicine)  Extended Emergency Contact Information Primary Emergency Contact: Rack,[name] Address: [address]          [place], [place] [address] Johnnette Litter of [place] Phone: [phone] Mobile Phone: [phone] Relation: Spouse   Goals of care: Advanced Directive information Advanced Directives 03/17/2018  Does Patient Have a Medical Advance Directive? No  Would patient like information on creating a medical advance directive? No - Patient declined     Chief Complaint  Patient presents with  . Acute Visit    HA, nausea x1 month; also bladder pain    HPI:  Pt is a 67 y.o. female seen today at [facility] office for an acute visit for evaluation of lower abdominal pain,cough,runny nose,headache x 1 month.she describes cough as dry making her head to hurt.also has clear drainage from the nose.she denies any dripping drainage at the back of her mouth.No sneezing,watery eyes or itching.she states lower abdominal pain is ongoing since she was seen last by MD on 03/17/2018.wakes up several times at night to urinate but has difficulty starting to urinate and sometimes unable to urinate.she complains of lower abdominal pain.she is able to urinate well during the day.recent U/A and C/S was negative for urinary tract infections.Of note she states has an upcoming scheduled abdominal Ultrasound with Gynecology in January 2020 due to Fibroids.she denies any fever or chills.     Past Medical History:  Diagnosis Date  . GERD (gastroesophageal reflux disease)   . Hyperlipidemia   . Hypertension    Past Surgical History:  Procedure Laterality Date  . BIOPSY THYROID  2008  . TONSILECTOMY, ADENOIDECTOMY, BILATERAL MYRINGOTOMY AND TUBES Bilateral 1957    No Known Allergies  Outpatient Encounter Medications as of 04/10/2018  Medication Sig  . alendronate (FOSAMAX)  70 MG tablet TAKE 1 TABLET EVERY WEEK  . b complex vitamins tablet Take 1 tablet by mouth daily.  . Black Cohosh 40 MG CAPS Take 40 mg by mouth daily.  . Calcium Carbonate-Vit D-Min (CALCIUM 600 + MINERALS PO) Take by mouth 2 (two) times daily.  . diclofenac sodium (VOLTAREN) 1 % GEL 2 g to bilateral shoulders and 4 g to bilateral knees QID prn arthritis  . losartan-hydrochlorothiazide (HYZAAR) 100-12.5 MG tablet Take 1 tablet by mouth daily.  . metoprolol tartrate (LOPRESSOR) 50 MG tablet TAKE 1 TABLET TWICE DAILY  . Multiple Vitamin (MULTIVITAMIN) tablet Take 1 tablet by mouth 2 (two) times daily.   Marland Kitchen omeprazole (PRILOSEC) 20 MG capsule Take 1 capsule (20 mg total) by mouth daily.  Marland Kitchen OVER THE COUNTER MEDICATION Replenex - chondroitin, MSM, glucosamine, ginger, green tea -Take 2 pills in am and 1 pill at night  . OVER THE COUNTER MEDICATION daily. Phytomega - phytosterol esters 1000mg /fishoil 500mg /C&E -Take one pill twice a day  . Probiotic Product (PROBIOTIC DAILY PO) Take by mouth daily.  . simvastatin (ZOCOR) 20 MG tablet Take 1 tablet (20 mg total) by mouth daily.  . TURMERIC PO Take 1 tablet by mouth daily.   . vitamin C (ASCORBIC ACID) 500 MG tablet Take 500 mg by mouth daily.  . cetirizine (ZYRTEC) 10 MG tablet Take 1 tablet (10 mg total) by mouth daily.   Facility-Administered Encounter Medications as of 04/10/2018  Medication  . 0.9 %  sodium chloride infusion    Review of Systems  Constitutional: Negative for appetite  change, chills, fatigue, fever and unexpected weight change.  HENT: Positive for rhinorrhea. Negative for congestion, postnasal drip, sinus pressure, sinus pain and sore throat.   Eyes: Negative for pain, discharge, redness and itching.  Respiratory: Positive for cough. Negative for chest tightness, shortness of breath and wheezing.   Cardiovascular: Negative for chest pain, palpitations and leg swelling.  Gastrointestinal: Positive for abdominal pain. Negative for  abdominal distention, constipation, diarrhea, nausea and vomiting.  Genitourinary: Negative for dysuria and flank pain.       Urine frequency per HPI  Musculoskeletal: Positive for gait problem.  Skin: Negative for color change, pallor and rash.  Neurological: Positive for headaches. Negative for dizziness and light-headedness.       Headache from coughing  Psychiatric/Behavioral: Negative for agitation and confusion. The patient is not nervous/anxious.     Immunization History  Administered Date(s) Administered  . Influenza, High Dose Seasonal PF 06/10/2017  . Influenza-Unspecified 06/11/2014, 05/27/2015, 06/27/2016  . Pneumococcal Conjugate-13 07/06/2014  . Pneumococcal Polysaccharide-23 08/29/2015  . Tdap 06/23/2012   Pertinent  Health Maintenance Due  Topic Date Due  . INFLUENZA VACCINE  04/10/2018  . MAMMOGRAM  08/29/2019  . COLONOSCOPY  11/06/2026  . DEXA SCAN  Completed  . PNA vac Low Risk Adult  Completed   Fall Risk  03/17/2018 11/05/2017 09/05/2017 08/21/2017 02/28/2017  Falls in the past year? No No No No No   Functional Status Survey:    Vitals:   04/10/18 0937  BP: 122/70  Pulse: 64  Resp: 18  Temp: 98.1 F (36.7 C)  TempSrc: Oral  SpO2: 97%  Weight: 139 lb (63 kg)  Height: 4\' 10"  (1.473 m)   Body mass index is 29.05 kg/m. Physical Exam  Constitutional:  Thin built elderly in no acute distress   HENT:  Head: Normocephalic.  Right Ear: External ear normal.  Left Ear: External ear normal.  Nose: Nose normal.  Mouth/Throat: Oropharynx is clear and moist. No oropharyngeal exudate.  Eyes: Pupils are equal, round, and reactive to light. Conjunctivae and EOM are normal. Right eye exhibits no discharge. Left eye exhibits no discharge. No scleral icterus.  Neck: Normal range of motion. No JVD present. No thyromegaly present.  Pulmonary/Chest: Effort normal and breath sounds normal. No respiratory distress. She has no wheezes. She has no rales.  Abdominal:  Soft. Bowel sounds are normal. She exhibits no distension and no mass. There is tenderness. There is no rebound and no guarding.  Bilateral Lower abdominal tender to palpation  Lymphadenopathy:    She has no cervical adenopathy.  Vitals reviewed.   Labs reviewed: Recent Labs    09/05/17 0947 03/12/18 0906  NA 140 137  K 4.1 4.3  CL 104 100  CO2 31 26  GLUCOSE 103* 101*  BUN 18 17  CREATININE 0.92 1.11*  CALCIUM 9.2 9.3   Recent Labs    09/05/17 0947 03/12/18 0906  AST 25 18  ALT 28 12  BILITOT 0.6 0.7  PROT 6.5 7.0   Recent Labs    09/05/17 0947 03/12/18 0906  WBC 6.8 10.9*  NEUTROABS 3,414 8,121*  HGB 14.7 12.9  HCT 42.2 38.3  MCV 95.7 96.0  PLT 305 540*   Lab Results  Component Value Date   TSH 3.030 06/02/2015   Lab Results  Component Value Date   HGBA1C 5.9 (H) 03/12/2018   Lab Results  Component Value Date   CHOL 137 03/12/2018   HDL 34 (L) 03/12/2018   LDLCALC 77  03/12/2018   TRIG 159 (H) 03/12/2018   CHOLHDL 4.0 03/12/2018    Significant Diagnostic Results in last 30 days:  No results found.  Assessment/Plan 1. Lower abdominal pain Bilateral lower abdominal tenderness to palpation.No distension or mass noted.Will rule out possible bladder mass. - US Abdomen Complete; Future - US Pelvis Complete; Future  2. Cough headache Afebrile.Bilateral lung CTA.continue on OTC cough syrup as needed.Notify provider's office for shortness of breath,wheezing or worsening cough.continue OTC Tylenol for headache as needed.   3. Runny nose Afebrile.Possible allergic rhinitis.start on Cetrizine 10 mg tablet one by mouth daily x 14 days.encouraged fluid intake.continue to monitor.   Family/ staff Communication: Reviewed plan of care with patient.  Labs/tests ordered: Abdominal/pelvic ultrasound rule out abdominal mass  Sandrea Hughs, NP

## 2018-04-10 NOTE — Patient Instructions (Signed)
1. Take over the counter Zyrtec 10 mg tablet one by mouth daily for runny nose. 2. Continue on over the counter cough syrup for cough 3. Lower abdominal ultrasound scheduled due to lower abdominal pain  4. Notify provider if running fever > 100.0 unable to urinate or go to the Emergency room.  Abdominal Pain, Adult Many things can cause belly (abdominal) pain. Most times, belly pain is not dangerous. Many cases of belly pain can be watched and treated at home. Sometimes belly pain is serious, though. Your doctor will try to find the cause of your belly pain. Follow these instructions at home:  Take over-the-counter and prescription medicines only as told by your doctor. Do not take medicines that help you poop (laxatives) unless told to by your doctor.  Drink enough fluid to keep your pee (urine) clear or pale yellow.  Watch your belly pain for any changes.  Keep all follow-up visits as told by your doctor. This is important. Contact a doctor if:  Your belly pain changes or gets worse.  You are not hungry, or you lose weight without trying.  You are having trouble pooping (constipated) or have watery poop (diarrhea) for more than 2-3 days.  You have pain when you pee or poop.  Your belly pain wakes you up at night.  Your pain gets worse with meals, after eating, or with certain foods.  You are throwing up and cannot keep anything down.  You have a fever. Get help right away if:  Your pain does not go away as soon as your doctor says it should.  You cannot stop throwing up.  Your pain is only in areas of your belly, such as the right side or the left lower part of the belly.  You have bloody or black poop, or poop that looks like tar.  You have very bad pain, cramping, or bloating in your belly.  You have signs of not having enough fluid or water in your body (dehydration), such as: ? Dark pee, very little pee, or no pee. ? Cracked lips. ? Dry mouth. ? Sunken  eyes. ? Sleepiness. ? Weakness. This information is not intended to replace advice given to you by your health care provider. Make sure you discuss any questions you have with your health care provider. Document Released: 02/13/2008 Document Revised: 03/16/2016 Document Reviewed: 02/08/2016 Elsevier Interactive Patient Education  2018 [employer].

## 2018-04-16 ENCOUNTER — Ambulatory Visit
Admission: RE | Admit: 2018-04-16 | Discharge: 2018-04-16 | Disposition: A | Discharge status: Home / Self Care | Payer: Medicare HMO | Source: Ambulatory Visit | Attending: Family | Admitting: Family

## 2018-04-16 DIAGNOSIS — R1030 Lower abdominal pain, unspecified: Secondary | ICD-10-CM

## 2018-04-17 ENCOUNTER — Encounter: Payer: Self-pay | Admitting: Internal Medicine

## 2018-04-17 ENCOUNTER — Ambulatory Visit (INDEPENDENT_AMBULATORY_CARE_PROVIDER_SITE_OTHER): Payer: Medicare HMO | Admitting: Internal Medicine

## 2018-04-17 VITALS — BP 110/70 | HR 79 | Temp 98.5°F | Ht <= 58 in | Wt 137.0 lb

## 2018-04-17 DIAGNOSIS — R110 Nausea: Secondary | ICD-10-CM

## 2018-04-17 DIAGNOSIS — R112 Nausea with vomiting, unspecified: Secondary | ICD-10-CM | POA: Diagnosis not present

## 2018-04-17 DIAGNOSIS — R634 Abnormal weight loss: Secondary | ICD-10-CM | POA: Diagnosis not present

## 2018-04-17 DIAGNOSIS — R339 Retention of urine, unspecified: Secondary | ICD-10-CM

## 2018-04-17 NOTE — Progress Notes (Signed)
Location:  [facility] Provider: Ryana Montecalvo L. Mariea Clonts, D.O., C.M.D.  Code Status: full code Goals of Care:  Advanced Directives 03/17/2018  Does Patient Have a Medical Advance Directive? No  Would patient like information on creating a medical advance directive? No - Patient declined   Chief Complaint  Patient presents with  . Acute Visit    abdominal pain xweeks    HPI: Patient is a 67 y.o. female seen today for an acute visit for   Abdominal pain has gotten worse.  She still needs to go to the bathroom at night, painful and cannot go.  She's getting chills, then starts sweating for a while.  In the day, she is chilly, then she's suddenly hot.  May last 1.5 hrs, then more normal again.   She was meant to get her [place] in Jan but may need to go sooner.    One night this week, her thumb was numb and the tips of her fingers were numb and they looked white for a while.  She warmed them and it resolved.  She still has a cough--comes and goes.  sometimes a little tickle and coughs until she throws it up.  Still no appetite.  Not eating much.  Takes several pills in the morning and occasionally one gags her, she regurgitates it, she feels sick for a little bit.  She's nauseous.    At [place], she was told she'd have to have a full bladder, but she doesn't think she could stand to hold it to have her bladder full.    Zyrtec has not helped the cough or runny nose.  Maybe a tad better.    Not usually dizzy.  bp running lower lately.    Past Medical History:  Diagnosis Date  . GERD (gastroesophageal reflux disease)   . Hyperlipidemia   . Hypertension     Past Surgical History:  Procedure Laterality Date  . BIOPSY THYROID  2008  . TONSILECTOMY, ADENOIDECTOMY, BILATERAL MYRINGOTOMY AND TUBES Bilateral 1957    No Known Allergies  Outpatient Encounter Medications as of 04/17/2018  Medication Sig  . alendronate (FOSAMAX) 70 MG tablet TAKE 1 TABLET EVERY WEEK  . b complex vitamins  tablet Take 1 tablet by mouth daily.  . Black Cohosh 40 MG CAPS Take 40 mg by mouth daily.  . Calcium Carbonate-Vit D-Min (CALCIUM 600 + MINERALS PO) Take by mouth 2 (two) times daily.  . cetirizine (ZYRTEC) 10 MG tablet Take 1 tablet (10 mg total) by mouth daily.  . diclofenac sodium (VOLTAREN) 1 % GEL 2 g to bilateral shoulders and 4 g to bilateral knees QID prn arthritis  . losartan-hydrochlorothiazide (HYZAAR) 100-12.5 MG tablet Take 1 tablet by mouth daily.  . metoprolol tartrate (LOPRESSOR) 50 MG tablet TAKE 1 TABLET TWICE DAILY  . Multiple Vitamin (MULTIVITAMIN) tablet Take 1 tablet by mouth 2 (two) times daily.   Marland Kitchen omeprazole (PRILOSEC) 20 MG capsule Take 1 capsule (20 mg total) by mouth daily.  Marland Kitchen OVER THE COUNTER MEDICATION Replenex - chondroitin, MSM, glucosamine, ginger, green tea -Take 2 pills in am and 1 pill at night  . OVER THE COUNTER MEDICATION daily. Phytomega - phytosterol esters 1000mg /fishoil 500mg /C&E -Take one pill twice a day  . Probiotic Product (PROBIOTIC DAILY PO) Take by mouth daily.  . simvastatin (ZOCOR) 20 MG tablet Take 1 tablet (20 mg total) by mouth daily.  . TURMERIC PO Take 1 tablet by mouth daily.   . vitamin C (ASCORBIC ACID)  500 MG tablet Take 500 mg by mouth daily.  . [DISCONTINUED] 0.9 %  sodium chloride infusion    No facility-administered encounter medications on file as of 04/17/2018.     Review of Systems:  Review of Systems  Constitutional: Negative for chills and fever.  HENT: Positive for congestion.   Eyes: Negative for blurred vision.  Respiratory: Positive for cough and sputum production. Negative for shortness of breath.   Cardiovascular: Negative for chest pain, palpitations and leg swelling.  Gastrointestinal: Positive for abdominal pain, nausea and vomiting. Negative for blood in stool, constipation, diarrhea, heartburn and melena.  Genitourinary: Negative for dysuria, flank pain, frequency, hematuria and urgency.       Difficulty  emptying bladder, then coughs and incontinent, suprapubic pain  Musculoskeletal: Negative for falls.  Neurological: Negative for dizziness.  Endo/Heme/Allergies: Does not bruise/bleed easily.  Psychiatric/Behavioral: Negative for depression and memory loss. The patient is not nervous/anxious and does not have insomnia.     Health Maintenance  Topic Date Due  . INFLUENZA VACCINE  04/10/2018  . MAMMOGRAM  08/29/2019  . TETANUS/TDAP  06/23/2022  . COLONOSCOPY  11/06/2026  . DEXA SCAN  Completed  . Hepatitis C Screening  Completed  . PNA vac Low Risk Adult  Completed    Physical Exam: Vitals:   04/17/18 1418  BP: 110/70  Pulse: 79  Temp: 98.5 F (36.9 C)  TempSrc: Oral  SpO2: 95%  Weight: 137 lb (62.1 kg)  Height: 4\' 10"  (1.473 m)   Body mass index is 28.63 kg/m. Physical Exam  Constitutional: She is oriented to person, place, and time. She appears well-developed and well-nourished. No distress.  HENT:  Head: Normocephalic and atraumatic.  Cardiovascular: Normal rate, regular rhythm, normal heart sounds and intact distal pulses.  Pulmonary/Chest: Effort normal and breath sounds normal. No respiratory distress.  Abdominal: Soft. Bowel sounds are normal. She exhibits no distension and no mass. There is tenderness. There is no rebound and no guarding.  Over bladder and lower abdomen  Musculoskeletal: Normal range of motion.  Neurological: She is alert and oriented to person, place, and time.  Skin: Skin is warm and dry.  Psychiatric: She has a normal mood and affect.    Labs reviewed: Basic Metabolic Panel: Recent Labs    09/05/17 0947 03/12/18 0906  NA 140 137  K 4.1 4.3  CL 104 100  CO2 31 26  GLUCOSE 103* 101*  BUN 18 17  CREATININE 0.92 1.11*  CALCIUM 9.2 9.3   Liver Function Tests: Recent Labs    09/05/17 0947 03/12/18 0906  AST 25 18  ALT 28 12  BILITOT 0.6 0.7  PROT 6.5 7.0   No results for input(s): LIPASE, AMYLASE in the last 8760 hours. No  results for input(s): AMMONIA in the last 8760 hours. CBC: Recent Labs    09/05/17 0947 03/12/18 0906  WBC 6.8 10.9*  NEUTROABS 3,414 8,121*  HGB 14.7 12.9  HCT 42.2 38.3  MCV 95.7 96.0  PLT 305 540*   Lipid Panel: Recent Labs    09/05/17 0947 03/12/18 0906  CHOL 148 137  HDL 43* 34*  LDLCALC 72 77  TRIG 239* 159*  CHOLHDL 3.4 4.0   Lab Results  Component Value Date   HGBA1C 5.9 (H) 03/12/2018   Assessment/Plan 1. Non-intractable vomiting with nausea, unspecified vomiting type - has variety of new symptoms including suprapubic abdominal pain (not a choice for CT abd/pelvis), difficulty emptying her bladder, pain if it's full, then incontinence  with coughing (stress), nausea, vomiting if she coughs too much, difficulty swallowing pills - CT Abdomen Pelvis Wo Contrast; Future  2. Abnormal weight loss - had lost weight with keto diet but sounds like she's now losing due to nausea and decreased intake, husband concerned she's not eating - CT Abdomen Pelvis Wo Contrast; Future  3. Nausea -no clear cause, some vomiting after coughing only, but does not have clear reason to have that much coughing  4. Unable to empty bladder - see hpi for more info  - Ambulatory referral to Urology  Labs/tests ordered:   Orders Placed This Encounter  Procedures  . CT Abdomen Pelvis Wo Contrast    Standing Status:   Future    Standing Expiration Date:   07/19/2019    Scheduling Instructions:     Please notify pt of any special instructions    Order Specific Question:   ** REASON FOR EXAM (FREE TEXT)    Answer:   suprapubic and lower abdominal pain; difficulty emptying bladder; known fibroids    Order Specific Question:   Preferred imaging location?    Answer:   [facility]    Order Specific Question:   Is Oral Contrast requested for this exam?    Answer:   Yes, Per Radiology protocol    Order Specific Question:   Radiology Contrast Protocol - do NOT remove file path    Answer:    [email or web address]  . Ambulatory referral to Urology    Referral Priority:   Routine    Referral Type:   Consultation    Referral Reason:   Specialty Services Required    Requested Specialty:   Urology    Number of Visits Requested:   1   Next appt:  05/26/2018  Jovonne Wilton L. Kelisha Dall, D.O. [facility] [address], [place] [address] Cell Phone (Mon-Fri 8am-5pm):  [phone] On Call:  [phone] & follow prompts after 5pm & weekends Office Phone:  [phone] Office Fax:  [phone]

## 2018-04-17 NOTE — Addendum Note (Signed)
Addended by: Logan Bores on: 04/17/2018 03:54 PM   Modules accepted: Orders

## 2018-04-23 ENCOUNTER — Other Ambulatory Visit: Payer: Self-pay | Admitting: Internal Medicine

## 2018-04-24 ENCOUNTER — Telehealth: Payer: Self-pay | Admitting: *Deleted

## 2018-04-24 NOTE — Telephone Encounter (Signed)
Patient called and stated that her referral that was placed states [facility] in [place] and patient does not want to go to [place] stated that she wants to go to [place].

## 2018-04-24 NOTE — Telephone Encounter (Signed)
Referral info was sent to [facility] in [place]. I called to verify this and was told that they did receive the referral and it is still being reviewed. Pt has not been scheduled yet.   Patient verbalized understanding.

## 2018-04-24 NOTE — Telephone Encounter (Signed)
Noted  

## 2018-04-29 ENCOUNTER — Ambulatory Visit
Admission: RE | Admit: 2018-04-29 | Discharge: 2018-04-29 | Disposition: A | Discharge status: Home / Self Care | Payer: Medicare HMO | Source: Ambulatory Visit | Attending: Internal Medicine | Admitting: Internal Medicine

## 2018-04-29 DIAGNOSIS — K5730 Diverticulosis of large intestine without perforation or abscess without bleeding: Secondary | ICD-10-CM | POA: Diagnosis not present

## 2018-04-29 DIAGNOSIS — R634 Abnormal weight loss: Secondary | ICD-10-CM

## 2018-04-29 DIAGNOSIS — R112 Nausea with vomiting, unspecified: Secondary | ICD-10-CM

## 2018-04-29 MED ORDER — IOPAMIDOL (ISOVUE-300) INJECTION 61%
100.0000 mL | Freq: Once | INTRAVENOUS | Status: AC | PRN
  Administered 2018-04-29: 100 mL via INTRAVENOUS

## 2018-04-30 ENCOUNTER — Telehealth: Payer: Self-pay

## 2018-04-30 ENCOUNTER — Other Ambulatory Visit: Payer: Self-pay | Admitting: Internal Medicine

## 2018-04-30 DIAGNOSIS — R634 Abnormal weight loss: Secondary | ICD-10-CM

## 2018-04-30 DIAGNOSIS — R1030 Lower abdominal pain, unspecified: Secondary | ICD-10-CM

## 2018-04-30 DIAGNOSIS — R1900 Intra-abdominal and pelvic swelling, mass and lump, unspecified site: Secondary | ICD-10-CM

## 2018-04-30 DIAGNOSIS — R935 Abnormal findings on diagnostic imaging of other abdominal regions, including retroperitoneum: Secondary | ICD-10-CM

## 2018-04-30 NOTE — Telephone Encounter (Signed)
Diane with [facility] called to inform Dr.Reed of abnormal CT Abdomen/Pelvis results (refer to report completed on 04/29/18)  I informed Diane that I will inform Dr.Gupta whom is covering for Dr.Reed (Dr.Reed out of office x 2 weeks)  Dr.Gupta please address results and advise

## 2018-04-30 NOTE — Addendum Note (Signed)
Addended by: Logan Bores on: 04/30/2018 11:00 AM   Modules accepted: Orders

## 2018-04-30 NOTE — Telephone Encounter (Signed)
Spoke with patient, patient verbalized understanding of results. Patient in agreement with seeing GI and having U/S   I called [facility] whom patient seen in 2017 to schedule appointment. Patient to see PA-Paula tomorrow 05/01/18 at 2:30 pm, arrive at 2:15 pm  Patient aware of appointment date, time, location and given number in the event that she needs to reschedule. Patient advised that if she has to reschedule it may put her appointment out 2 weeks or longer.

## 2018-04-30 NOTE — Telephone Encounter (Signed)
Patient need appointment with Gastroenterologist as soon as possible for Sigmoid Mass. She also need Pelvic US to further investigate the Pelvic Mass.

## 2018-04-30 NOTE — Telephone Encounter (Signed)
I called [facility] to schedule appointment for U/S, [facility] is questioning what type of U/S Dr.Gupta would like for patient to have  1.) U/S Pelvis Complete with transvaginal  2.) U/S Pelvis limited  If Dr.Gupta needs to consult with the radiologist to further determine, call [phone], select option 6  Dr.Gupta please advise

## 2018-04-30 NOTE — Telephone Encounter (Signed)
Order placed

## 2018-04-30 NOTE — Telephone Encounter (Signed)
U/S Pelvis Complete with Transvaginal to evaluate Mass seen in CT scan

## 2018-04-30 NOTE — Telephone Encounter (Signed)
I called [facility] and scheduled appointment for next Tuesday 05/06/18 @ 2:00 pm, arrive at 1:40 pm. Special Instructions: Full bladder, drink 32 oz of water 1 hour prior to appointment and hold   Patient aware of appointment details, including special instructions.   Side Note: Appointment was available tomorrow 05/01/18 at 3 pm but patient scheduled to see GI tomorrow at 2:30 pm.

## 2018-04-30 NOTE — Addendum Note (Signed)
Addended by: Logan Bores on: 04/30/2018 01:25 PM   Modules accepted: Orders

## 2018-05-01 ENCOUNTER — Encounter: Payer: Self-pay | Admitting: Nurse Practitioner

## 2018-05-01 ENCOUNTER — Ambulatory Visit: Payer: Medicare HMO | Admitting: Nurse Practitioner

## 2018-05-01 VITALS — BP 102/60 | HR 72 | Ht <= 58 in | Wt 133.6 lb

## 2018-05-01 DIAGNOSIS — R9389 Abnormal findings on diagnostic imaging of other specified body structures: Secondary | ICD-10-CM

## 2018-05-01 DIAGNOSIS — R1032 Left lower quadrant pain: Secondary | ICD-10-CM

## 2018-05-01 DIAGNOSIS — R634 Abnormal weight loss: Secondary | ICD-10-CM | POA: Diagnosis not present

## 2018-05-01 NOTE — Patient Instructions (Signed)
If you are age 69 or older, your body mass index should be between 23-30. Your Body mass index is 27.92 kg/m. If this is out of the aforementioned range listed, please consider follow up with your Primary Care Provider.  If you are age 76 or younger, your body mass index should be between 19-25. Your Body mass index is 27.92 kg/m. If this is out of the aformentioned range listed, please consider follow up with your Primary Care Provider.   Please call us after pelvic ultrasound and Primary Care Physician gives plan of treatment.  Thank you for choosing me and [facility].   Tye Savoy, NP

## 2018-05-01 NOTE — Progress Notes (Addendum)
P Primary GI:  Harl Bowie, MD      Chief Complaint:    Abnormal CT scan  IMPRESSION and PLAN:    #1.  67 yo female with lower abdominal pain, weight loss and large pelvic mass on CT scan. There is left hydronephrosis likely from extrinsic compression by this mass. Large uterine fibroid? Ovarian lesion?    #2. Abnormal sigmoid colon. CT scan also describes irregular wall thickening of the sigmoid colon measuring  1.3 cm "concerning for colon mass" .   -patient had a complete colonoscopy with excellent bowel prep only 1.5 years ago so neoplasm seems unlikely. Regardless she will need  follow-up on this finding, For right now evaluation of the large pelvic mass is most pressing issue. Patient will call us next week when she gets results of pelvic u/s from PCP. We may do a flexible sigmoidoscopy to evaluate the sigmoid colonoscopy.   ADDENDUM 06/02/18: Update on pelvic mass. I reviewed EMR / Notes in Care Everywhere.. Several days ago patient admitted to [facility] for exploratory laparotomy, total abdominal hysterectomy, bilateral salpingo-oophorectomy, peritoneal biopsies for ovarian cancer staging, bilateral pelvic lymphadenectomy, bilateral para-aortic lymphadenectomy, infracolic omentectomy, and R0 resection.. She had elevated an CA-125. 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   HPI:     Patient is a 67 year old female here for evaluation of an abnormal CT scan. She had a screening colonoscopy February 2018 with findings of diverticulosis.  Recommended to have repeat colonoscopy in 10 years.   Patient has been having dffuse lower abdominal pain and has lost 20 pounds since late June. The pain started several weeks ago but is getting worse ( intensity). It is worse just prior to having a BM or passing gas. No constipation, diarrhea or blood in stool.  The pain is not related to eating. Sometimes feels like needs to urinate but has difficulty starting flow.     CT scan on 8/20 demonstrates a large pelvic mass with heterogeneous appearance, measuring at least 15 centimeters in diameter. Although the findings favor enlarged uterus/fibroids, ovaries are not well seen and an ovarian mass should also be considered. Further characterization with pelvic ultrasound is recommended. 2. The bladder is completely decompressed. There is LEFT-sided hydronephrosis secondary to extrinsic compression from the pelvic mass. 3. Irregular thickening of the sigmoid colon, suspicious for colonic wall mass, measuring 1.3 centimeters. In this patient with a family history of colon cancer, further evaluation with colonoscopy is recommended.  Review of systems:     No chest pain, no SOB, no fevers, no urinary sx   Past Medical History:  Diagnosis Date  . GERD (gastroesophageal reflux disease)   . Hyperlipidemia   . Hypertension     Patient's surgical history, family medical history, social history, medications and allergies were all reviewed in Epic   Creatinine clearance cannot be calculated (Patient's most recent lab result is older than the maximum 21 days allowed.)  Current Outpatient Medications  Medication Sig Dispense Refill  . alendronate (FOSAMAX) 70 MG tablet TAKE 1 TABLET EVERY WEEK 12 tablet 3  . b complex vitamins tablet Take 1 tablet by mouth daily.    . Black Cohosh 40 MG CAPS Take 40 mg by mouth daily.    . Calcium Carbonate-Vit D-Min (CALCIUM 600 + MINERALS PO) Take by mouth 2 (two) times daily.    Marland Kitchen losartan-hydrochlorothiazide (HYZAAR) 100-12.5 MG tablet Take 1 tablet by mouth daily. 90 tablet 3  . metoprolol tartrate (  LOPRESSOR) 50 MG tablet TAKE 1 TABLET TWICE DAILY 180 tablet 1  . Multiple Vitamin (MULTIVITAMIN) tablet Take 1 tablet by mouth 2 (two) times daily.     Marland Kitchen omeprazole (PRILOSEC) 20 MG capsule Take 1 capsule (20 mg total) by mouth daily. 90 capsule 3  . OVER THE COUNTER MEDICATION Replenex - chondroitin, MSM, glucosamine, ginger,  green tea -Take 2 pills in am and 1 pill at night    . OVER THE COUNTER MEDICATION daily. Phytomega - phytosterol esters 1000mg /fishoil 500mg /C&E -Take one pill twice a day    . Probiotic Product (PROBIOTIC DAILY PO) Take by mouth daily.    . simvastatin (ZOCOR) 20 MG tablet Take 1 tablet (20 mg total) by mouth daily. 90 tablet 3  . TURMERIC PO Take 1 tablet by mouth daily.     . vitamin C (ASCORBIC ACID) 500 MG tablet Take 500 mg by mouth daily.     No current facility-administered medications for this visit.     Physical Exam:     BP 102/60   Pulse 72   Ht 4\' 10"  (1.473 m)   Wt 133 lb 9.6 oz (60.6 kg)   BMI 27.92 kg/m   GENERAL:  Pleasant female in NAD PSYCH: : Cooperative, normal affect EENT:  conjunctiva pink, mucous membranes moist, neck supple without masses CARDIAC:  RRR, no peripheral edema PULM: Normal respiratory effort, lungs CTA bilaterally, no wheezing ABDOMEN:  Nondistended, soft, mild diffuse lower abdominal tenderness. No obvious masses, no hepatomegaly,  normal bowel sounds SKIN:  turgor, no lesions seen Musculoskeletal:  Normal muscle tone, normal strength NEURO: Alert and oriented x 3, no focal neurologic deficits   Tye Savoy , NP 05/01/2018, 2:54 PM

## 2018-05-02 ENCOUNTER — Telehealth: Payer: Self-pay | Admitting: *Deleted

## 2018-05-02 DIAGNOSIS — R102 Pelvic and perineal pain: Secondary | ICD-10-CM | POA: Diagnosis not present

## 2018-05-02 DIAGNOSIS — R3915 Urgency of urination: Secondary | ICD-10-CM | POA: Diagnosis not present

## 2018-05-02 DIAGNOSIS — N3000 Acute cystitis without hematuria: Secondary | ICD-10-CM | POA: Diagnosis not present

## 2018-05-02 NOTE — Telephone Encounter (Signed)
Patient called and stated that she went to [facility] and they performed a Pelvic Ultrasound due to lower abdominal pain. Stated that she has an appointment with [facility] that we referred her to on 05/06/18 for a Pelvic Ultrasound and she wants that canceled. Stated that [facility] referred her to a GYN office to be evaluated.   Called and canceled referral appointment per patient's request.

## 2018-05-02 NOTE — Telephone Encounter (Signed)
Ch[facility] do we need to cancel the referral order?

## 2018-05-05 ENCOUNTER — Encounter: Payer: Self-pay | Admitting: Nurse Practitioner

## 2018-05-05 ENCOUNTER — Other Ambulatory Visit (HOSPITAL_COMMUNITY): Payer: Self-pay | Admitting: Obstetrics & Gynecology

## 2018-05-05 DIAGNOSIS — R1900 Intra-abdominal and pelvic swelling, mass and lump, unspecified site: Secondary | ICD-10-CM

## 2018-05-05 NOTE — Telephone Encounter (Signed)
Cancelled order.

## 2018-05-06 ENCOUNTER — Ambulatory Visit: Payer: Medicare HMO | Admitting: Gynecology

## 2018-05-06 ENCOUNTER — Other Ambulatory Visit: Payer: Medicare HMO

## 2018-05-06 NOTE — Progress Notes (Signed)
Large calcified pelvic mass arising from uterus or ovaries. Sigmoid thickening likely artifact due to under distension or extrinsic involvement. Patient had a full colonoscopy in 2018 with good prep. Repeat colonoscopy or flex sigmoidoscopy less likely to be high yield in this scenario and also will be technically challenging due to the large pelvic mass.   Reviewed and agree with documentation and assessment and plan. Damaris Hippo , MD

## 2018-05-08 ENCOUNTER — Other Ambulatory Visit: Payer: Self-pay | Admitting: Gynecologic Oncology

## 2018-05-08 ENCOUNTER — Ambulatory Visit (HOSPITAL_COMMUNITY)
Admission: RE | Admit: 2018-05-08 | Discharge: 2018-05-08 | Disposition: A | Discharge status: Home / Self Care | Payer: Medicare HMO | Source: Ambulatory Visit | Attending: Obstetrics & Gynecology | Admitting: Obstetrics & Gynecology

## 2018-05-08 DIAGNOSIS — R1900 Intra-abdominal and pelvic swelling, mass and lump, unspecified site: Secondary | ICD-10-CM

## 2018-05-08 DIAGNOSIS — D259 Leiomyoma of uterus, unspecified: Secondary | ICD-10-CM | POA: Diagnosis not present

## 2018-05-08 NOTE — Progress Notes (Signed)
CEA and CA 125 drawn prior to appt per Dr. Gerarda Fraction

## 2018-05-08 NOTE — Progress Notes (Signed)
[place] at [facility] Note: New Patient FIRST VISIT   Consult was requested by Dr. Vania Rea for a pelvic mass   Chief Complaint  Patient presents with  . Pelvic mass in female    ONCOLOGIC SUMMARY 1. N/A   HPI: [name]  is a very nice 67 y.o. P3  Patient states she is been followed for years for history of fibroids with ultrasound.   She saw her PCP July/early August 2019 and noted some abdominal pain. This persisted over several weeks and she followed back up with them. The PCP ordered imaging, initially an ultrasound was ordered by the physician extender, but the patient never had one done apparently because she had to have a full bladder?Marland Kitchen Then on the return followup the MD ordered a CTScan.  She has other complaints aside from the pain. She needs to go to the bathroom at night, painful and cannot go.  She's noting fevers and sometimes chills.  Notes nausea since the end of June and decreased appetite. Has lost 20 pounds intentionally and then another 7 more over the summer unintentionally.   Had several tests at the PCP one including eval for Lyme disease which showed partial immune response suggesting recent infeciton and plan was for followup   04/29/18 CT AP - There is dilatation of the LEFT renal pelvis and LEFT ureter secondary to mass effect of "enlarged uterus". Moderate hiatal hernia. There is focal thickening of the wall of the sigmoid colon which raises the question of a mural mass. This is best seen on coronal image number 44. Mass measures 1.3 x 1.0 centimeters. There are scattered colonic diverticula but no acute diverticulitis. No enlarged abdominal or pelvic lymph nodes. Reproductive: There is a heterogeneous, partially calcified mass within the central pelvis, measuring at least 15.1 x 15.0 x 12.7 cm. The ovaries are difficult to identify separately from this mass and considerations include ovarian or  uterine mass. Other: No free pelvic fluid. Anterior abdominal wall is unremarkable. Musculoskeletal: No acute or significant osseous findings. IMPRESSION: 1. Large pelvic mass with heterogeneous appearance, measuring at least 15 centimeters in diameter. Although the findings favor enlarged uterus/fibroids, ovaries are not well seen and an ovarian mass should also be considered. Further characterization with pelvic ultrasound is recommended. 2. LEFT-sided hydronephrosis secondary to extrinsic compression from the pelvic mass. 3. Irregular thickening of the sigmoid colon, suspicious for colonic wall mass, measuring 1.3 centimeters. In this patient with a family history of colon cancer, further evaluation with colonoscopy is recommended. 4. Colonic diverticula without acute diverticulitis. 5. Moderate hiatal hernia.   Following the CT results she was referred to GI and urology. GI deferred management stating she had clearance from them in 2017. Urology apparently did an [place] in their office and told the patient she had large fibroids and called her gynecologist (Dr. Stann Mainland) for followup. Per the patient's report Dr. Stann Mainland felt the patient may be experiencing a degenerating fibroid however the imaging (now both CT and [place]) is non-committal as to the origin (uterine versus adnexal).  She is therefore sent to [place] for the diagnosis of a pelvic mass.  She is unsure the size of her fibroids in the past She thinks her last scan was 2 years ago in Dr. Gwynne Edinger office, although the last scan we see in [facility] is 2005. That showed 3 fibroids (6.6cm, 4.4cm, and 3cm).  Imported [facility] Oncologic History:   No history exists.  Measurement of disease: . N/A  Radiology: Ct Abdomen Pelvis W Contrast  Result Date: 04/29/2018 CLINICAL DATA:  Lower abdomen pain difficulty emptying bladder nausea vomiting. 26 lb weight loss in four months. EXAM: CT ABDOMEN AND PELVIS WITH CONTRAST TECHNIQUE: Multidetector CT imaging of the abdomen  and pelvis was performed using the standard protocol following bolus administration of intravenous contrast. CONTRAST:  184mL ISOVUE-300 IOPAMIDOL (ISOVUE-300) INJECTION 61% COMPARISON:  07/16/2005 FINDINGS: Lower chest: Lung bases are unremarkable.  Heart size is normal. Hepatobiliary: Gallbladder is present. There is focal fat within the liver at the falciform ligament. Pancreas: Unremarkable. No pancreatic ductal dilatation or surrounding inflammatory changes. Spleen: Normal in size without focal abnormality. Adrenals/Urinary Tract: Adrenal glands are normal in appearance. There is dilatation of the LEFT renal pelvis and LEFT ureter secondary to mass effect of enlarged uterus. No renal mass. The bladder is completely decompressed. Stomach/Bowel: Moderate hiatal hernia. Stomach is normal in appearance. Small bowel loops are normal in appearance. There is focal thickening of the wall of the sigmoid colon which raises the question of a mural mass. This is best seen on coronal image number 44. Mass measures 1.3 x 1.0 centimeters. There are scattered colonic diverticula but no acute diverticulitis. Vascular/Lymphatic: No significant vascular findings are present. No enlarged abdominal or pelvic lymph nodes. Reproductive: There is a heterogeneous, partially calcified mass within the central pelvis, measuring at least 15.1 x 15.0 x 12.7 centimeters. The ovaries are difficult to identify separately from this mass and considerations include ovarian or uterine mass. Other: No free pelvic fluid. Anterior abdominal wall is unremarkable. Musculoskeletal: No acute or significant osseous findings. IMPRESSION: 1. Large pelvic mass with heterogeneous appearance, measuring at least 15 centimeters in diameter. Although the findings favor enlarged uterus/fibroids, ovaries are not well seen and an ovarian mass should also be considered. Further characterization with pelvic ultrasound is recommended. 2. The bladder is completely  decompressed. There is LEFT-sided hydronephrosis secondary to extrinsic compression from the pelvic mass. 3. Irregular thickening of the sigmoid colon, suspicious for colonic wall mass, measuring 1.3 centimeters. In this patient with a family history of colon cancer, further evaluation with colonoscopy is recommended. 4. Colonic diverticula without acute diverticulitis. 5. Moderate hiatal hernia. Electronically Signed   By: Nolon Nations M.D.   On: 04/29/2018 20:38   US Pelvic Complete With Transvaginal  Result Date: 05/08/2018 CLINICAL DATA:  Pelvic mass EXAM: TRANSABDOMINAL AND TRANSVAGINAL ULTRASOUND OF PELVIS TECHNIQUE: Both transabdominal and transvaginal ultrasound examinations of the pelvis were performed. Transabdominal technique was performed for global imaging of the pelvis including uterus, ovaries, adnexal regions, and pelvic cul-de-sac. It was necessary to proceed with endovaginal exam following the transabdominal exam to visualize the uterus, endometrium, ovaries and adnexa. COMPARISON:  CT 04/29/2018 FINDINGS: Uterus Measurements: Enlarged, 16.4 x 8.9 x 13.0 cm. Multiple fibroids. Two fundal fibroids measure up to 5.2 cm each. There is a large central pelvic complex solid and cystic mass which measures 12.3 x 9.4 x 6.9 cm. Internal blood flow noted within the solid components. Internal septations noted with diffuse echoes throughout the cystic components. Endometrium Thickness: Not visualized due to the large mass. Right ovary Measurements: 3.3 x 2.2 x 2.8 cm. Normal appearance/no adnexal mass. Left ovary Measurements: Not visualized. Other findings No abnormal free fluid. IMPRESSION: Large complex central pelvic mass. It is difficult to determine if this is within the uterus or arising posterior to the uterus and possibly involving the left ovary. Differential considerations would include cystic degeneration of a  fibroid, uterus are coma, or ovarian neoplasm. If not resected, recommend MRI for  further characterization. Electronically Signed   By: Rolm Baptise M.D.   On: 05/08/2018 20:35   .   Outpatient Encounter Medications as of 05/09/2018  Medication Sig  . alendronate (FOSAMAX) 70 MG tablet TAKE 1 TABLET EVERY WEEK  . Calcium Carbonate-Vit D-Min (CALCIUM 600 + MINERALS PO) Take by mouth 2 (two) times daily.  Marland Kitchen losartan-hydrochlorothiazide (HYZAAR) 100-12.5 MG tablet Take 1 tablet by mouth daily.  . metoprolol tartrate (LOPRESSOR) 50 MG tablet TAKE 1 TABLET TWICE DAILY  . Multiple Vitamin (MULTIVITAMIN) tablet Take 1 tablet by mouth 2 (two) times daily.   Marland Kitchen omeprazole (PRILOSEC) 20 MG capsule Take 1 capsule (20 mg total) by mouth daily.  Marland Kitchen OVER THE COUNTER MEDICATION Replenex - chondroitin, MSM, glucosamine, ginger, green tea -Take 2 pills in am and 1 pill at night  . Probiotic Product (PROBIOTIC DAILY PO) Take by mouth as needed.   . simvastatin (ZOCOR) 20 MG tablet Take 1 tablet (20 mg total) by mouth daily.  Marland Kitchen b complex vitamins tablet Take 1 tablet by mouth as needed.   . Black Cohosh 40 MG CAPS Take 40 mg by mouth daily.  Marland Kitchen OVER THE COUNTER MEDICATION daily. Phytomega - phytosterol esters 1000mg /fishoil 500mg /C&E -Take one pill twice a day  . TURMERIC PO Take 1 tablet by mouth daily.   . vitamin C (ASCORBIC ACID) 500 MG tablet Take 500 mg by mouth daily.   No facility-administered encounter medications on file as of 05/09/2018.    No Known Allergies  Past Medical History:  Diagnosis Date  . GERD (gastroesophageal reflux disease)   . Hyperlipidemia   . Hypertension    Past Surgical History:  Procedure Laterality Date  . BIOPSY THYROID  2008  . TONSILECTOMY, ADENOIDECTOMY, BILATERAL MYRINGOTOMY AND TUBES Bilateral 1957        Past Gynecological History:   GYNECOLOGIC HISTORY:  . No LMP recorded. Patient is postmenopausal. LMP age 26 . Menarche: 67 years old . P 3 . Contraceptive OCP and IUD . HRT uses black cohosh  . Last Pap 08/2016 negative Family Hx:   Family History  Problem Relation Age of Onset  . Hypertension Father   . Diabetes Father   . Coronary artery disease Father   . Hyperlipidemia Father   . Colon cancer Father   . Hypertension Mother   . Arthritis Mother    Social Hx:  Marland Kitchen Tobacco use: none . Alcohol use: none . Illicit Drug use: none . Illicit IV Drug use: none    Review of Systems: Review of Systems  Constitutional: Positive for fever.  Respiratory: Positive for cough.   Gastrointestinal: Positive for abdominal pain, constipation and nausea.  Genitourinary: Positive for dysuria and pelvic pain.   Neurological: Positive for headaches.  All other systems reviewed and are negative. +Early satiety  Vitals: There were no vitals taken for this visit. Vitals:   05/09/18 1121  Weight: 133 lb 11.2 oz (60.6 kg)  Height: 4\' 10"  (1.473 m)    Vitals:   05/09/18 1121  BP: 105/71  Pulse: 74  Resp: 18  Temp: 98.2 F (36.8 C)  SpO2: 97%   Body mass index is 27.94 kg/m.   Physical Exam: General :  Anxious appearing, well developed, 67 y.o., female in no apparent distress HEENT:  Normocephalic/atraumatic, symmetric, EOMI, eyelids normal Neck:   Supple, no masses.  Lymphatics:  No cervical/ submandibular/ supraclavicular/ infraclavicular/ inguinal adenopathy  Respiratory:  Respirations unlabored, no use of accessory muscles CV:   Deferred Breast:  Deferred Musculoskeletal: No CVA tenderness, normal muscle strength. Abdomen:  Soft, tenderness with deep palpation in pelvis and nondistended. No evidence of hernia. No obvious mass, but difficult exam due to guarding. Extremities:  No lymphedema, no erythema, non-tender. Skin:   Normal inspection Neuro/Psych:  No focal motor deficit, no abnormal mental status. Normal gait. Normal affect. Alert and oriented to person, place, and time  Genito Urinary: Vulva: Normal external female genitalia.  Bladder/urethra: Urethral meatus normal in size and location. No lesions or    masses, well supported bladder Speculum exam: Vagina: No lesion, no discharge, no bleeding. Cervix: Normal appearing, no lesions. Bimanual exam:  Uterus: Suspect enlarged, feels mobile though. Difficult to palpate fundus as patient complains this is uncomfortable.  Adnexa: No masses. Rectovaginal:  Good tone, no masses, no cul de sac nodularity, no parametrial involvement or nodularity.   Assessment  Pelvic mass (fibroid versus adnexa)  Plan  1. Data reviewed ?  I independently reviewed the images and the radiology reports and discussed my interpretation in the presence of the patient and her husband today ? I reviewed her referring doctor's office notes and I have summarized in the HPI ? History was obtained equally from the patient and the chart, given her complex history multiple providers seen ? We reviewed that her CEA tumor marker is normal however the CA125 I ordered is still pending 2. Etiology of pelvic mass ? Degenerating fibroid versus adnexal mass ? She has symptoms and exam c/w a degenerating fibroid ? I will order a pelvic MRI if her CA125 is normal 3. Management ? If this is felt to be a fibroid will defer management to Dr. Stann Mainland since the pain from the degeneration may resolve. ? In that case she will need to circle back around to urology and GI given the hydro and the questionable colon mass. ? If this is adnexal in origin or if her CA125 is elevated then I think surgical management is appropriate 4. We will try to see what the prior ultrasound(s) in Dr. Gwynne Edinger office have shown to compare  5. RTC either to discuss surgery or to review MRI pending CA125 results.  Face to face time with patient was 60 minutes. Over 50% of this time was spent on counseling and coordination of care.  Melissa Caprice, MD  05/09/2018, 3:35 PM  ADDENDUM - we obtained one additional ultrasound report from 08/2016 where a solid left ovarian mass is seen 2.8x2.3.2.1 cm unchanged from 2016.  In addition 3 large fibroids (4.7cm, 4.76cm, and 2.73cm)   Cc: Vania Rea, MD (Referring Ob/Gyn) Gayland Curry, DO (PCP)

## 2018-05-09 ENCOUNTER — Encounter: Payer: Self-pay | Admitting: Obstetrics

## 2018-05-09 ENCOUNTER — Inpatient Hospital Stay: Payer: Medicare HMO | Attending: Obstetrics | Admitting: Obstetrics

## 2018-05-09 ENCOUNTER — Inpatient Hospital Stay: Payer: Medicare HMO

## 2018-05-09 VITALS — BP 105/71 | HR 74 | Temp 98.2°F | Resp 18 | Ht <= 58 in | Wt 133.7 lb

## 2018-05-09 DIAGNOSIS — R1900 Intra-abdominal and pelvic swelling, mass and lump, unspecified site: Secondary | ICD-10-CM

## 2018-05-09 DIAGNOSIS — R971 Elevated cancer antigen 125 [CA 125]: Secondary | ICD-10-CM | POA: Insufficient documentation

## 2018-05-09 DIAGNOSIS — R109 Unspecified abdominal pain: Secondary | ICD-10-CM | POA: Insufficient documentation

## 2018-05-09 LAB — CEA (IN HOUSE-CHCC): CEA (CHCC-In House): 1.02 ng/mL (ref 0.00–5.00)

## 2018-05-09 NOTE — Patient Instructions (Signed)
1. If your bloodwork is elevated please followup with [place] next week to discuss and schedule surgery 2. If the bloodwork is normal then we will order an MRI and followup in the office after that.

## 2018-05-10 LAB — CA 125: Cancer Antigen (CA) 125: 158 U/mL — ABNORMAL HIGH (ref 0.0–38.1)

## 2018-05-13 ENCOUNTER — Telehealth: Payer: Self-pay | Admitting: Gynecologic Oncology

## 2018-05-13 ENCOUNTER — Telehealth: Payer: Self-pay

## 2018-05-13 DIAGNOSIS — R1907 Generalized intra-abdominal and pelvic swelling, mass and lump: Secondary | ICD-10-CM

## 2018-05-13 NOTE — Telephone Encounter (Signed)
Returned call back to patient stating Dr. Gerarda Fraction is agreeable with proceeding with the MRI to rule out degenerating fibroid vs adnexal mass.  Pt will be contacted with the date and time.

## 2018-05-13 NOTE — Telephone Encounter (Addendum)
Per Joylene John NP - outgoing call to patient with appt for MRI at 6:30 am, on this Thursday [facility]- nothing to eat or drink 4 hours before and appt for results on this Friday at 1:45 pm with Dr Gerarda Fraction- pt voiced understanding and agreeable for both appts. No other needs per pt at this time. I also let her know if any issues with pre-auth - someone will call her to reschedule.  (Denies working with metal, denies any metal in body, and is not claustrophobic)

## 2018-05-13 NOTE — Telephone Encounter (Signed)
Called patient and informed her of CA 125 results along with Dr. Gerarda Fraction recommendation for an office visit to discuss surgery.  "I think something else is going on so I want to have everything addressed at the same time."  Advised patient that I would discuss with Dr. Gerarda Fraction and call her back to discuss.

## 2018-05-15 ENCOUNTER — Ambulatory Visit (HOSPITAL_COMMUNITY)
Admission: RE | Admit: 2018-05-15 | Discharge: 2018-05-15 | Disposition: A | Discharge status: Home / Self Care | Payer: Medicare HMO | Source: Ambulatory Visit | Attending: Gynecologic Oncology | Admitting: Gynecologic Oncology

## 2018-05-15 ENCOUNTER — Other Ambulatory Visit: Payer: Self-pay | Admitting: Gynecologic Oncology

## 2018-05-15 ENCOUNTER — Telehealth: Payer: Self-pay

## 2018-05-15 DIAGNOSIS — R1907 Generalized intra-abdominal and pelvic swelling, mass and lump: Secondary | ICD-10-CM

## 2018-05-15 DIAGNOSIS — D250 Submucous leiomyoma of uterus: Secondary | ICD-10-CM | POA: Diagnosis not present

## 2018-05-15 DIAGNOSIS — R7989 Other specified abnormal findings of blood chemistry: Secondary | ICD-10-CM

## 2018-05-15 DIAGNOSIS — D259 Leiomyoma of uterus, unspecified: Secondary | ICD-10-CM | POA: Diagnosis not present

## 2018-05-15 LAB — POCT I-STAT CREATININE: CREATININE: 2.4 mg/dL — AB (ref 0.44–1.00)

## 2018-05-15 MED ORDER — GADOBENATE DIMEGLUMINE 529 MG/ML IV SOLN: 15.0000 mL | Freq: Once | INTRAVENOUS | Status: DC | PRN

## 2018-05-15 NOTE — Progress Notes (Signed)
Creatinine increased to 2.4 from 1.11 2 months ago.  Mass effect noted to the left kidney on CT from 04/29/18. Dr. Gerarda Fraction notified.  Plan  For a stat renal US to re-evaluate the kidneys due to increasing creatinine.

## 2018-05-15 NOTE — Telephone Encounter (Signed)
Told Ms Melissa Simpson that her kidney function is elevated from 2 months ago.  Dr. Gerarda Fraction would like to get a renal scan prior to appointment tomorrow to see if her ureters are being compressed from the pelvic mass in her abdomen.  This information will help with discussion of her treatment planing tomorrow. Ms Melissa Simpson agreed to have renal US tomorrow at [facility] at 1130 am  Prior to Dr. Gerarda Fraction' appointment. Instructed her to begin  Drinking  a couple of glasses of water at 1045 am.  She needs a full bladder. Pt verbalized understanding.

## 2018-05-15 NOTE — Progress Notes (Signed)
[place] at [facility]   Progress Note: Established Patient Follow-Up Visit  Consult was originally  requested by Dr. Vania Rea for a pelvic mass   Chief Complaint  Patient presents with  . Pelvic mass    ONCOLOGIC SUMMARY 1. N/A   HPI: [name]  is a very nice 67 y.o. P3  Patient states she is been followed for years for history of fibroids with ultrasound.   She saw her PCP July/early August 2019 and noted some abdominal pain. This persisted over several weeks and she followed back up with them. The PCP ordered imaging, initially an ultrasound was ordered by the physician extender, but the patient never had one done apparently because she had to have a full bladder?Marland Kitchen Then on the return followup the MD ordered a CTScan.  She has other complaints aside from the pain. She needs to go to the bathroom at night, painful and cannot go.  She's noting fevers and sometimes chills.  Notes nausea since the end of June and decreased appetite. Has lost 20 pounds intentionally (this year) and then another 7 more over the summer unintentionally.   Had several tests at the PCP one including eval for Lyme disease which showed partial immune response suggesting recent infection and plan was for followup   04/29/18 CT AP - There is dilatation of the LEFT renal pelvis and LEFT ureter secondary to mass effect of "enlarged uterus". Moderate hiatal hernia. There is focal thickening of the wall of the sigmoid colon which raises the question of a mural mass. This is best seen on coronal image number 44. Mass measures 1.3 x 1.0 centimeters. There are scattered colonic diverticula but no acute diverticulitis. No enlarged abdominal or pelvic lymph nodes. Reproductive: There is a heterogeneous, partially calcified mass within the central pelvis, measuring at least 15.1 x 15.0 x 12.7 cm. The ovaries are difficult to identify separately from this mass and considerations  include ovarian or uterine mass. Other: No free pelvic fluid. Anterior abdominal wall is unremarkable. Musculoskeletal: No acute or significant osseous findings. IMPRESSION: 1. Large pelvic mass with heterogeneous appearance, measuring at least 15 centimeters in diameter. Although the findings favor enlarged uterus/fibroids, ovaries are not well seen and an ovarian mass should also be considered. Further characterization with pelvic ultrasound is recommended. 2. LEFT-sided hydronephrosis secondary to extrinsic compression from the pelvic mass. 3. Irregular thickening of the sigmoid colon, suspicious for colonic wall mass, measuring 1.3 centimeters. In this patient with a family history of colon cancer, further evaluation with colonoscopy is recommended. 4. Colonic diverticula without acute diverticulitis. 5. Moderate hiatal hernia.   Following the CT results she was referred to GI and urology. GI deferred management stating she had clearance from them in 2017. Urology apparently did an [place] in their office and told the patient she had large fibroids and called her gynecologist (Dr. Stann Mainland) for followup. Per the patient's report Dr. Gwynne Edinger office spoke with her over the phone and felt the patient may be experiencing a degenerating fibroid however the imaging (now both CT and [place]) is non-committal as to the origin (uterine versus adnexal). The patient has been followed by Dr. Stann Mainland for her fibroids and has had ultrasounds done prior to the entire workup above.   She is therefore sent to [place] for the diagnosis of a pelvic mass.  She is unsure the size of her fibroids in the past She thinks her last scan was 2 years ago  in Dr. Gwynne Edinger office, although the last scan we see in [facility] is 2005. That showed 3 fibroids (6.6cm, 4.4cm, and 3cm).   Interval history Since her last visit we note the elevated CA125. I had mentioned to her if that was the case we would move to surgery but she told our nursing staff she still wanted  to MRI before returning. Thus since her last visit we have the CA125 and a pelvic MRI as noted below.  Her creatinine has increased, likely due to hypovolemia. Renal US showed slight ureteral prominence bilaterally. Mild fullness of the left renal collecting system  She is still having similar symptoms as the last visit.  Imported [facility] Oncologic History:   No history exists.    Measurement of disease: . TBD . Preop CA125 Recent Labs    05/09/18 1034  CAN125 158.0*    Radiology: Mr Pelvis Wo Contrast  Addendum Date: 05/16/2018   ADDENDUM REPORT: 05/16/2018 13:43 ADDENDUM: In addition to findings in report below, a 1.4 cm rounded mural density is seen in the fundus of the gallbladder which is suspicious for a gallbladder polyp. Abdomen ultrasound is recommended for further evaluation of this finding. Electronically Signed   By: Earle Gell M.D.   On: 05/16/2018 13:43   Result Date: 05/16/2018 CLINICAL DATA:  Lower abdominal and pelvic pain. Pelvic mass. Elevated CA 125 level. EXAM: MRI PELVIS WITHOUT CONTRAST TECHNIQUE: Multiplanar multisequence MR imaging of the pelvis was performed. No intravenous contrast was administered. COMPARISON:  CT on 04/29/2018 FINDINGS: Urinary Tract: No urinary bladder or urethral abnormality. Bowel: Unremarkable pelvic bowel loops. Vascular/Lymphatic: Unremarkable. No pathologically enlarged pelvic lymph nodes identified. Reproductive: -- Uterus: Measures 14.3 by 6.9 x 9.6 cm (volume = 500 cm^3). Four distinct fibroids are seen in the uterine body and fundus. Largest is submucosal in location measuring 4.8 cm. Another fibroid is pedunculated and arising from the right fundal region measuring 3.7 cm. No abnormal endometrial thickening seen. Cervix is unremarkable. The uterus is displaced anteriorly and to the right by the left pelvic mass described below. -- Right ovary: Not visualized, however no right-sided adnexal mass identified. -- Left ovary: No normal ovary  visualized. A large mass is seen in the left post adnexal and cul-de-sac regions which measures 16.2 x 8.4 x 11.6 cm. This is contiguous with the uterus and is well-circumscribed. This mass contains a large internal solid component which shows areas of T2 hypointensity and hyperintensity, and cystic component throughout the periphery of this lesion which shows several internal septations. No internal fat is seen. This is suspicious for ovarian carcinoma, although differential diagnosis also includes atypical pedunculated fibroid with peripheral cystic degeneration. There is no evidence of invasion of adjacent structures. Other: No peritoneal thickening or abnormal free fluid. Musculoskeletal:  Unremarkable. IMPRESSION: 16 cm complex cystic and solid mass which is centered in the left posterior adnexa and is contiguous with the uterus. This is suspicious for ovarian carcinoma, with differential diagnosis of atypical pedunculated fibroid with peripheral cystic degeneration. Surgical evaluation is recommended. Other uterine fibroids measuring up to 4.8 cm. No evidence of pelvic metastatic disease. Electronically Signed: By: Earle Gell M.D. On: 05/15/2018 11:05   Ct Abdomen Pelvis W Contrast  Result Date: 04/29/2018 CLINICAL DATA:  Lower abdomen pain difficulty emptying bladder nausea vomiting. 26 lb weight loss in four months. EXAM: CT ABDOMEN AND PELVIS WITH CONTRAST TECHNIQUE: Multidetector CT imaging of the abdomen and pelvis was performed using the standard protocol following bolus administration of intravenous  contrast. CONTRAST:  133mL ISOVUE-300 IOPAMIDOL (ISOVUE-300) INJECTION 61% COMPARISON:  07/16/2005 FINDINGS: Lower chest: Lung bases are unremarkable.  Heart size is normal. Hepatobiliary: Gallbladder is present. There is focal fat within the liver at the falciform ligament. Pancreas: Unremarkable. No pancreatic ductal dilatation or surrounding inflammatory changes. Spleen: Normal in size without focal  abnormality. Adrenals/Urinary Tract: Adrenal glands are normal in appearance. There is dilatation of the LEFT renal pelvis and LEFT ureter secondary to mass effect of enlarged uterus. No renal mass. The bladder is completely decompressed. Stomach/Bowel: Moderate hiatal hernia. Stomach is normal in appearance. Small bowel loops are normal in appearance. There is focal thickening of the wall of the sigmoid colon which raises the question of a mural mass. This is best seen on coronal image number 44. Mass measures 1.3 x 1.0 centimeters. There are scattered colonic diverticula but no acute diverticulitis. Vascular/Lymphatic: No significant vascular findings are present. No enlarged abdominal or pelvic lymph nodes. Reproductive: There is a heterogeneous, partially calcified mass within the central pelvis, measuring at least 15.1 x 15.0 x 12.7 centimeters. The ovaries are difficult to identify separately from this mass and considerations include ovarian or uterine mass. Other: No free pelvic fluid. Anterior abdominal wall is unremarkable. Musculoskeletal: No acute or significant osseous findings. IMPRESSION: 1. Large pelvic mass with heterogeneous appearance, measuring at least 15 centimeters in diameter. Although the findings favor enlarged uterus/fibroids, ovaries are not well seen and an ovarian mass should also be considered. Further characterization with pelvic ultrasound is recommended. 2. The bladder is completely decompressed. There is LEFT-sided hydronephrosis secondary to extrinsic compression from the pelvic mass. 3. Irregular thickening of the sigmoid colon, suspicious for colonic wall mass, measuring 1.3 centimeters. In this patient with a family history of colon cancer, further evaluation with colonoscopy is recommended. 4. Colonic diverticula without acute diverticulitis. 5. Moderate hiatal hernia. Electronically Signed   By: Nolon Nations M.D.   On: 04/29/2018 20:38   US Renal  Result Date:  05/16/2018 CLINICAL DATA:  Elevated creatinine.  Known pelvic mass EXAM: RENAL / URINARY TRACT ULTRASOUND COMPLETE COMPARISON:  CT abdomen and pelvis April 29, 2018; pelvic MRI May 15, 2018 FINDINGS: Right Kidney: Length: 9.4 cm. Echogenicity and renal cortical thickness are within normal limits. No mass, perinephric fluid, or hydronephrosis visualized. No demonstrable calculus by ultrasound. There is prominence of the proximal right ureter. Left Kidney: Length: 10.2 cm. Echogenicity and renal cortical thickness are within normal limits. No mass or perinephric fluid visualized. There is mild fullness of the left renal collecting system and proximal ureter. No calculus evident. Bladder: Appears normal for degree of bladder distention. There is a large complex pelvic mass measuring 16.3 x 8.8 x 1.1 cm which shows evidence of vascular flow. IMPRESSION: 1. Large pelvic mass described in detail on MRI examination 1 day prior. 2. Slight ureteral prominence bilaterally. Mild fullness of the left renal collecting system. No obstructing focus evident. Suspect fullness of these structures due to ureteral compression by the sizable pelvic mass. 3.  Study otherwise unremarkable. Electronically Signed   By: Lowella Grip III M.D.   On: 05/16/2018 11:52   US Pelvic Complete With Transvaginal  Result Date: 05/08/2018 CLINICAL DATA:  Pelvic mass EXAM: TRANSABDOMINAL AND TRANSVAGINAL ULTRASOUND OF PELVIS TECHNIQUE: Both transabdominal and transvaginal ultrasound examinations of the pelvis were performed. Transabdominal technique was performed for global imaging of the pelvis including uterus, ovaries, adnexal regions, and pelvic cul-de-sac. It was necessary to proceed with endovaginal exam following the transabdominal  exam to visualize the uterus, endometrium, ovaries and adnexa. COMPARISON:  CT 04/29/2018 FINDINGS: Uterus Measurements: Enlarged, 16.4 x 8.9 x 13.0 cm. Multiple fibroids. Two fundal fibroids measure up to  5.2 cm each. There is a large central pelvic complex solid and cystic mass which measures 12.3 x 9.4 x 6.9 cm. Internal blood flow noted within the solid components. Internal septations noted with diffuse echoes throughout the cystic components. Endometrium Thickness: Not visualized due to the large mass. Right ovary Measurements: 3.3 x 2.2 x 2.8 cm. Normal appearance/no adnexal mass. Left ovary Measurements: Not visualized. Other findings No abnormal free fluid. IMPRESSION: Large complex central pelvic mass. It is difficult to determine if this is within the uterus or arising posterior to the uterus and possibly involving the left ovary. Differential considerations would include cystic degeneration of a fibroid, uterus are coma, or ovarian neoplasm. If not resected, recommend MRI for further characterization. Electronically Signed   By: Rolm Baptise M.D.   On: 05/08/2018 20:35   .   Outpatient Encounter Medications as of 05/16/2018  Medication Sig  . alendronate (FOSAMAX) 70 MG tablet TAKE 1 TABLET EVERY WEEK  . Calcium Carbonate-Vit D-Min (CALCIUM 600 + MINERALS PO) Take by mouth 2 (two) times daily.  Marland Kitchen losartan-hydrochlorothiazide (HYZAAR) 100-12.5 MG tablet Take 1 tablet by mouth daily.  . metoprolol tartrate (LOPRESSOR) 50 MG tablet TAKE 1 TABLET TWICE DAILY  . Multiple Vitamin (MULTIVITAMIN) tablet Take 1 tablet by mouth 2 (two) times daily.   Marland Kitchen omeprazole (PRILOSEC) 20 MG capsule Take 1 capsule (20 mg total) by mouth daily.  . simvastatin (ZOCOR) 20 MG tablet Take 1 tablet (20 mg total) by mouth daily.  Marland Kitchen b complex vitamins tablet Take 1 tablet by mouth as needed.   . Black Cohosh 40 MG CAPS Take 40 mg by mouth daily.  Marland Kitchen OVER THE COUNTER MEDICATION Replenex - chondroitin, MSM, glucosamine, ginger, green tea -Take 2 pills in am and 1 pill at night  . OVER THE COUNTER MEDICATION daily. Phytomega - phytosterol esters 1000mg /fishoil 500mg /C&E -Take one pill twice a day  . Probiotic Product  (PROBIOTIC DAILY PO) Take by mouth as needed.   . TURMERIC PO Take 1 tablet by mouth daily.   . vitamin C (ASCORBIC ACID) 500 MG tablet Take 500 mg by mouth daily.  . [DISCONTINUED] gadobenate dimeglumine (MULTIHANCE) injection 15 mL    No facility-administered encounter medications on file as of 05/16/2018.    No Known Allergies  Past Medical History:  Diagnosis Date  . GERD (gastroesophageal reflux disease)   . Hyperlipidemia   . Hypertension    Past Surgical History:  Procedure Laterality Date  . BIOPSY THYROID  2008  . TONSILECTOMY, ADENOIDECTOMY, BILATERAL MYRINGOTOMY AND TUBES Bilateral 1957        Past Gynecological History:   GYNECOLOGIC HISTORY:  . No LMP recorded. Patient is postmenopausal. LMP age 83 . Menarche: 67 years old . P 3 . Contraceptive OCP and IUD . HRT uses black cohosh  . Last Pap 08/2016 negative Family Hx:  Family History  Problem Relation Age of Onset  . Hypertension Father   . Diabetes Father   . Coronary artery disease Father   . Hyperlipidemia Father   . Colon cancer Father   . Hypertension Mother   . Arthritis Mother    Social Hx:  Marland Kitchen Tobacco use: none . Alcohol use: none . Illicit Drug use: none . Illicit IV Drug use: none    Review of Systems:  Review of Systems  Respiratory: Positive for cough.   Gastrointestinal: Positive for abdominal pain and nausea.  Genitourinary: Positive for dysuria.   Neurological: Positive for headaches.   +Early satiety  Vitals: There were no vitals taken for this visit. Vitals:   05/16/18 1338  Weight: 133 lb 4.8 oz (60.5 kg)  Height: 4\' 10"  (1.473 m)    Vitals:   05/16/18 1338  BP: 117/79  Pulse: 73  Resp: 18  Temp: 98.4 F (36.9 C)  SpO2: 98%   Body mass index is 27.86 kg/m.   Physical Exam: From 05/09/2018 visit General :  Anxious appearing, well developed, 67 y.o., female in no apparent distress HEENT:  Normocephalic/atraumatic, symmetric, EOMI, eyelids normal Neck:   Supple, no  masses.  Lymphatics:  No cervical/ submandibular/ supraclavicular/ infraclavicular/ inguinal adenopathy Respiratory:  Respirations unlabored, no use of accessory muscles CV:   Deferred Breast:  Deferred Musculoskeletal: No CVA tenderness, normal muscle strength. Abdomen:  Soft, tenderness with deep palpation in pelvis and nondistended. No evidence of hernia. No obvious mass, but difficult exam due to guarding. Extremities:  No lymphedema, no erythema, non-tender. Skin:   Normal inspection Neuro/Psych:  No focal motor deficit, no abnormal mental status. Normal gait. Normal affect. Alert and oriented to person, place, and time  Genito Urinary: Vulva: Normal external female genitalia.  Bladder/urethra: Urethral meatus normal in size and location. No lesions or   masses, well supported bladder Speculum exam: Vagina: No lesion, no discharge, no bleeding. Cervix: Normal appearing, no lesions. Bimanual exam:  Uterus: Suspect enlarged, feels mobile though. Difficult to palpate fundus as patient complains this is uncomfortable.  Adnexa: No masses. Rectovaginal:  Good tone, no masses, no cul de sac nodularity, no parametrial involvement or nodularity.   Assessment  Pelvic mass (fibroid versus adnexa)  Plan  1. Data reviewed ? I independently reviewed the MRI images and the radiology reports and discussed my interpretation in the presence of the patient and her husband today ? Reviewed the CA125 is elevated at 158 2. Etiology of pelvic mass ? Degenerating fibroid versus adnexal mass ? She has symptoms and exam c/w a degenerating fibroid however MRI was not definitive and now we have an elevated CA125. ? I don't like that she is nauseated ? She would like to know what other tests we can do to determine if this is her ovary or a fibroid. ? I told her the only option left to let her know this is going to be surgery. 3. Management ? Options include laparoscopy to determine etiology of mass and  if felt to be degenerating fibroid management per Dr. Stann Mainland, versus proceeding to laparotomy/pelvic mass resection regardless of whether it is the uterus or ovary. ? I would favor the latter approach to avoid an additional procedure. ? I think regardless of whether this is her ovary or her uterus most would favor resection/removal just due to the mass effect alone. 4. Surgical discussion ? We discussed given the large size if this is fibroid she is at high risk for intraoperative hemorrhage and urinary tract injury ? To be sure I would plan cell-saver ? I reviewed I think it is reasonable to just proceed with TAH/BSO and then if (ovarian) cancer appropriate staging. ? We are getting her set up in [facility] who is able to accommodate her on their schedule sooner than we will and who may have easier access if there is a need for transfusion/cell saver  Face to face time with  patient was 20 minutes. Over 50% of this time was spent on counseling and coordination of care.  Isabel Caprice, MD  05/16/2018, 3:34 PM    Cc: Vania Rea, MD (Referring Ob/Gyn) Gayland Curry, DO (PCP)

## 2018-05-16 ENCOUNTER — Ambulatory Visit (HOSPITAL_COMMUNITY)
Admission: RE | Admit: 2018-05-16 | Discharge: 2018-05-16 | Disposition: A | Discharge status: Home / Self Care | Payer: Medicare HMO | Source: Ambulatory Visit | Attending: Gynecologic Oncology | Admitting: Gynecologic Oncology

## 2018-05-16 ENCOUNTER — Inpatient Hospital Stay: Payer: Medicare HMO | Attending: Obstetrics | Admitting: Obstetrics

## 2018-05-16 ENCOUNTER — Encounter: Payer: Self-pay | Admitting: Obstetrics

## 2018-05-16 VITALS — BP 117/79 | HR 73 | Temp 98.4°F | Resp 18 | Ht <= 58 in | Wt 133.3 lb

## 2018-05-16 DIAGNOSIS — R1900 Intra-abdominal and pelvic swelling, mass and lump, unspecified site: Secondary | ICD-10-CM | POA: Diagnosis not present

## 2018-05-16 DIAGNOSIS — R7989 Other specified abnormal findings of blood chemistry: Secondary | ICD-10-CM | POA: Diagnosis not present

## 2018-05-16 DIAGNOSIS — R110 Nausea: Secondary | ICD-10-CM | POA: Diagnosis not present

## 2018-05-16 DIAGNOSIS — D259 Leiomyoma of uterus, unspecified: Secondary | ICD-10-CM | POA: Insufficient documentation

## 2018-05-16 DIAGNOSIS — R944 Abnormal results of kidney function studies: Secondary | ICD-10-CM | POA: Diagnosis not present

## 2018-05-16 DIAGNOSIS — R971 Elevated cancer antigen 125 [CA 125]: Secondary | ICD-10-CM | POA: Diagnosis not present

## 2018-05-16 NOTE — Patient Instructions (Signed)
Plan for surgery with Dr. Marti Sleigh at [facility] on May 27, 2018.  Your pre-op appointment at [facility] with Dr. Fermin Schwab will be on Sept 12, Thurs at 1:30pm at the [facility] at [facility].

## 2018-05-19 ENCOUNTER — Telehealth: Payer: Self-pay | Admitting: *Deleted

## 2018-05-19 NOTE — Telephone Encounter (Signed)
Patient called and just wanted to let you know that she is scheduled for surgery at [facility] in [place] on 05/27/18. Wanted to also cancel her lab appointment here at our office on 9/16. Canceled.

## 2018-05-19 NOTE — Telephone Encounter (Signed)
Noted.  I had just reviewed the notes from her gynecologist.  I appreciate the update.   I wish her the best with the surgery.

## 2018-05-20 ENCOUNTER — Telehealth: Payer: Self-pay | Admitting: *Deleted

## 2018-05-20 NOTE — Telephone Encounter (Signed)
Fax records to [facility]  

## 2018-05-22 DIAGNOSIS — D259 Leiomyoma of uterus, unspecified: Secondary | ICD-10-CM | POA: Diagnosis not present

## 2018-05-22 DIAGNOSIS — C577 Malignant neoplasm of other specified female genital organs: Secondary | ICD-10-CM | POA: Diagnosis not present

## 2018-05-22 DIAGNOSIS — R1900 Intra-abdominal and pelvic swelling, mass and lump, unspecified site: Secondary | ICD-10-CM | POA: Diagnosis not present

## 2018-05-23 DIAGNOSIS — D649 Anemia, unspecified: Secondary | ICD-10-CM | POA: Diagnosis not present

## 2018-05-23 DIAGNOSIS — D259 Leiomyoma of uterus, unspecified: Secondary | ICD-10-CM | POA: Diagnosis not present

## 2018-05-23 DIAGNOSIS — R509 Fever, unspecified: Secondary | ICD-10-CM | POA: Diagnosis not present

## 2018-05-23 DIAGNOSIS — E669 Obesity, unspecified: Secondary | ICD-10-CM | POA: Diagnosis not present

## 2018-05-23 DIAGNOSIS — C562 Malignant neoplasm of left ovary: Secondary | ICD-10-CM | POA: Diagnosis not present

## 2018-05-23 DIAGNOSIS — C5702 Malignant neoplasm of left fallopian tube: Secondary | ICD-10-CM | POA: Diagnosis not present

## 2018-05-23 DIAGNOSIS — I10 Essential (primary) hypertension: Secondary | ICD-10-CM | POA: Diagnosis not present

## 2018-05-23 DIAGNOSIS — N179 Acute kidney failure, unspecified: Secondary | ICD-10-CM | POA: Diagnosis not present

## 2018-05-23 DIAGNOSIS — N1330 Unspecified hydronephrosis: Secondary | ICD-10-CM | POA: Diagnosis not present

## 2018-05-23 DIAGNOSIS — D251 Intramural leiomyoma of uterus: Secondary | ICD-10-CM | POA: Diagnosis not present

## 2018-05-23 DIAGNOSIS — N1339 Other hydronephrosis: Secondary | ICD-10-CM | POA: Diagnosis not present

## 2018-05-23 DIAGNOSIS — N840 Polyp of corpus uteri: Secondary | ICD-10-CM | POA: Diagnosis not present

## 2018-05-23 DIAGNOSIS — C569 Malignant neoplasm of unspecified ovary: Secondary | ICD-10-CM | POA: Diagnosis not present

## 2018-05-23 DIAGNOSIS — D6859 Other primary thrombophilia: Secondary | ICD-10-CM | POA: Diagnosis not present

## 2018-05-23 DIAGNOSIS — R1900 Intra-abdominal and pelvic swelling, mass and lump, unspecified site: Secondary | ICD-10-CM | POA: Diagnosis not present

## 2018-05-23 HISTORY — DX: Acute kidney failure, unspecified: N17.9

## 2018-05-26 ENCOUNTER — Other Ambulatory Visit: Payer: Medicare HMO

## 2018-05-30 MED ORDER — SODIUM CHLORIDE 0.9 % IV SOLN
INTRAVENOUS | Status: DC
Start: ? — End: 2018-05-30

## 2018-05-30 MED ORDER — GUAIFENESIN-DM 100-10 MG/5ML PO SYRP
5.00 | ORAL_SOLUTION | ORAL | Status: DC
Start: ? — End: 2018-05-30

## 2018-05-30 MED ORDER — POLYETHYLENE GLYCOL 3350 17 G PO PACK
17.00 | PACK | ORAL | Status: DC
Start: 2018-05-31 — End: 2018-05-30

## 2018-05-30 MED ORDER — ACETAMINOPHEN 325 MG PO TABS
650.00 | ORAL_TABLET | ORAL | Status: DC
Start: 2018-05-30 — End: 2018-05-30

## 2018-05-30 MED ORDER — ONDANSETRON HCL 4 MG/2ML IJ SOLN
4.00 | INTRAMUSCULAR | Status: DC
Start: ? — End: 2018-05-30

## 2018-05-30 MED ORDER — PANTOPRAZOLE SODIUM 20 MG PO TBEC
20.00 | DELAYED_RELEASE_TABLET | ORAL | Status: DC
Start: 2018-05-31 — End: 2018-05-30

## 2018-05-30 MED ORDER — ENOXAPARIN SODIUM 40 MG/0.4ML ~~LOC~~ SOLN
40.00 | SUBCUTANEOUS | Status: DC
Start: 2018-05-31 — End: 2018-05-30

## 2018-05-30 MED ORDER — PRAVASTATIN SODIUM 40 MG PO TABS
20.00 | ORAL_TABLET | ORAL | Status: DC
Start: 2018-05-30 — End: 2018-05-30

## 2018-05-30 MED ORDER — MORPHINE SULFATE 4 MG/ML IJ SOLN
4.00 | INTRAMUSCULAR | Status: DC
Start: ? — End: 2018-05-30

## 2018-05-30 MED ORDER — GENERIC EXTERNAL MEDICATION
1.00 | Status: DC
Start: ? — End: 2018-05-30

## 2018-05-30 MED ORDER — GENERIC EXTERNAL MEDICATION
Status: DC
Start: ? — End: 2018-05-30

## 2018-05-30 MED ORDER — PHENOL 1.4 % MT LIQD
2.00 | OROMUCOSAL | Status: DC
Start: ? — End: 2018-05-30

## 2018-05-30 MED ORDER — GENERIC EXTERNAL MEDICATION
5.00 | Status: DC
Start: ? — End: 2018-05-30

## 2018-05-30 MED ORDER — GENERIC EXTERNAL MEDICATION
2.00 | Status: DC
Start: ? — End: 2018-05-30

## 2018-05-30 MED ORDER — DOCUSATE SODIUM 100 MG PO CAPS
100.00 | ORAL_CAPSULE | ORAL | Status: DC
Start: 2018-05-30 — End: 2018-05-30

## 2018-05-30 MED ORDER — IBUPROFEN 600 MG PO TABS
600.00 | ORAL_TABLET | ORAL | Status: DC
Start: 2018-05-30 — End: 2018-05-30

## 2018-05-30 MED ORDER — THERA-M PO TABS
1.00 | ORAL_TABLET | ORAL | Status: DC
Start: 2018-05-31 — End: 2018-05-30

## 2018-05-30 MED ORDER — ALENDRONATE SODIUM 70 MG PO TABS
70.00 | ORAL_TABLET | ORAL | Status: DC
Start: 2018-06-05 — End: 2018-05-30

## 2018-05-30 MED ORDER — DIPHENHYDRAMINE HCL 50 MG PO CAPS
50.00 | ORAL_CAPSULE | ORAL | Status: DC
Start: ? — End: 2018-05-30

## 2018-06-03 ENCOUNTER — Telehealth: Payer: Self-pay | Admitting: *Deleted

## 2018-06-03 NOTE — Telephone Encounter (Signed)
Called, spoke with the patient and scheduled appts

## 2018-06-04 DIAGNOSIS — Z452 Encounter for adjustment and management of vascular access device: Secondary | ICD-10-CM | POA: Diagnosis not present

## 2018-06-04 DIAGNOSIS — C569 Malignant neoplasm of unspecified ovary: Secondary | ICD-10-CM | POA: Diagnosis not present

## 2018-06-05 ENCOUNTER — Inpatient Hospital Stay: Payer: Medicare HMO | Admitting: Gynecologic Oncology

## 2018-06-05 ENCOUNTER — Other Ambulatory Visit: Payer: Self-pay | Admitting: Hematology and Oncology

## 2018-06-05 ENCOUNTER — Encounter: Payer: Self-pay | Admitting: Hematology and Oncology

## 2018-06-05 DIAGNOSIS — C562 Malignant neoplasm of left ovary: Secondary | ICD-10-CM

## 2018-06-05 NOTE — Assessment & Plan Note (Addendum)
We discussed the role of staging as well as adjuvant treatment Given spill of tumor noted at the time of surgery and colonic wall thickening noted on original CT scan, I recommend CT scan of the chest, abdomen and pelvis for staging I recommend genetic counseling I recommend chemotherapy class consent and chemotherapy to start in approximately 4 weeks from the date of surgery, meaning around the second week of October She is interested for hair saving measures and I will research more about the use of Dignicap I will see her back before the start of treatment for further discussion and management.

## 2018-06-09 ENCOUNTER — Other Ambulatory Visit: Payer: Self-pay | Admitting: Family

## 2018-06-09 ENCOUNTER — Encounter: Payer: Self-pay | Admitting: Oncology

## 2018-06-09 ENCOUNTER — Ambulatory Visit: Payer: Medicare HMO | Admitting: Gynecologic Oncology

## 2018-06-09 ENCOUNTER — Telehealth: Payer: Self-pay | Admitting: Hematology and Oncology

## 2018-06-09 ENCOUNTER — Encounter: Payer: Self-pay | Admitting: Hematology and Oncology

## 2018-06-09 ENCOUNTER — Inpatient Hospital Stay (HOSPITAL_BASED_OUTPATIENT_CLINIC_OR_DEPARTMENT_OTHER): Payer: Medicare HMO | Admitting: Hematology and Oncology

## 2018-06-09 ENCOUNTER — Encounter: Payer: Self-pay | Admitting: Gynecologic Oncology

## 2018-06-09 VITALS — BP 162/84 | HR 54 | Temp 97.6°F | Resp 18 | Ht <= 58 in | Wt 129.2 lb

## 2018-06-09 DIAGNOSIS — Z90722 Acquired absence of ovaries, bilateral: Secondary | ICD-10-CM | POA: Diagnosis not present

## 2018-06-09 DIAGNOSIS — C562 Malignant neoplasm of left ovary: Secondary | ICD-10-CM | POA: Diagnosis not present

## 2018-06-09 DIAGNOSIS — C801 Malignant (primary) neoplasm, unspecified: Secondary | ICD-10-CM

## 2018-06-09 DIAGNOSIS — I10 Essential (primary) hypertension: Secondary | ICD-10-CM

## 2018-06-09 DIAGNOSIS — Z79899 Other long term (current) drug therapy: Secondary | ICD-10-CM | POA: Diagnosis not present

## 2018-06-09 DIAGNOSIS — R971 Elevated cancer antigen 125 [CA 125]: Secondary | ICD-10-CM | POA: Diagnosis not present

## 2018-06-09 DIAGNOSIS — Z800 Family history of malignant neoplasm of digestive organs: Secondary | ICD-10-CM | POA: Diagnosis not present

## 2018-06-09 DIAGNOSIS — Z299 Encounter for prophylactic measures, unspecified: Secondary | ICD-10-CM

## 2018-06-09 DIAGNOSIS — D259 Leiomyoma of uterus, unspecified: Secondary | ICD-10-CM | POA: Diagnosis not present

## 2018-06-09 DIAGNOSIS — R110 Nausea: Secondary | ICD-10-CM | POA: Diagnosis not present

## 2018-06-09 DIAGNOSIS — R1900 Intra-abdominal and pelvic swelling, mass and lump, unspecified site: Secondary | ICD-10-CM | POA: Diagnosis not present

## 2018-06-09 DIAGNOSIS — Z90710 Acquired absence of both cervix and uterus: Secondary | ICD-10-CM | POA: Diagnosis not present

## 2018-06-09 DIAGNOSIS — Z23 Encounter for immunization: Secondary | ICD-10-CM

## 2018-06-09 MED ORDER — INFLUENZA VAC SPLIT HIGH-DOSE 0.5 ML IM SUSY
0.5000 mL | PREFILLED_SYRINGE | INTRAMUSCULAR | Status: AC
  Administered 2018-06-09: 0.5 mL via INTRAMUSCULAR
  Filled 2018-06-09: qty 0.5

## 2018-06-09 NOTE — Patient Instructions (Signed)
Carboplatin injection What is this medicine? CARBOPLATIN (KAR boe pla tin) is a chemotherapy drug. It targets fast dividing cells, like cancer cells, and causes these cells to die. This medicine is used to treat ovarian cancer and many other cancers. This medicine may be used for other purposes; ask your health care provider or pharmacist if you have questions. COMMON BRAND NAME(S): Paraplatin What should I tell my health care provider before I take this medicine? They need to know if you have any of these conditions: -blood disorders -hearing problems -kidney disease -recent or ongoing radiation therapy -an unusual or allergic reaction to carboplatin, cisplatin, other chemotherapy, other medicines, foods, dyes, or preservatives -pregnant or trying to get pregnant -breast-feeding How should I use this medicine? This drug is usually given as an infusion into a vein. It is administered in a hospital or clinic by a specially trained health care professional. Talk to your pediatrician regarding the use of this medicine in children. Special care may be needed. Overdosage: If you think you have taken too much of this medicine contact a poison control center or emergency room at once. NOTE: This medicine is only for you. Do not share this medicine with others. What if I miss a dose? It is important not to miss a dose. Call your doctor or health care professional if you are unable to keep an appointment. What may interact with this medicine? -medicines for seizures -medicines to increase blood counts like filgrastim, pegfilgrastim, sargramostim -some antibiotics like amikacin, gentamicin, neomycin, streptomycin, tobramycin -vaccines Talk to your doctor or health care professional before taking any of these medicines: -acetaminophen -aspirin -ibuprofen -ketoprofen -naproxen This list may not describe all possible interactions. Give your health care provider a list of all the medicines, herbs,  non-prescription drugs, or dietary supplements you use. Also tell them if you smoke, drink alcohol, or use illegal drugs. Some items may interact with your medicine. What should I watch for while using this medicine? Your condition will be monitored carefully while you are receiving this medicine. You will need important blood work done while you are taking this medicine. This drug may make you feel generally unwell. This is not uncommon, as chemotherapy can affect healthy cells as well as cancer cells. Report any side effects. Continue your course of treatment even though you feel ill unless your doctor tells you to stop. In some cases, you may be given additional medicines to help with side effects. Follow all directions for their use. Call your doctor or health care professional for advice if you get a fever, chills or sore throat, or other symptoms of a cold or flu. Do not treat yourself. This drug decreases your body's ability to fight infections. Try to avoid being around people who are sick. This medicine may increase your risk to bruise or bleed. Call your doctor or health care professional if you notice any unusual bleeding. Be careful brushing and flossing your teeth or using a toothpick because you may get an infection or bleed more easily. If you have any dental work done, tell your dentist you are receiving this medicine. Avoid taking products that contain aspirin, acetaminophen, ibuprofen, naproxen, or ketoprofen unless instructed by your doctor. These medicines may hide a fever. Do not become pregnant while taking this medicine. Women should inform their doctor if they wish to become pregnant or think they might be pregnant. There is a potential for serious side effects to an unborn child. Talk to your health care professional or   pharmacist for more information. Do not breast-feed an infant while taking this medicine. What side effects may I notice from receiving this medicine? Side effects  that you should report to your doctor or health care professional as soon as possible: -allergic reactions like skin rash, itching or hives, swelling of the face, lips, or tongue -signs of infection - fever or chills, cough, sore throat, pain or difficulty passing urine -signs of decreased platelets or bleeding - bruising, pinpoint red spots on the skin, black, tarry stools, nosebleeds -signs of decreased red blood cells - unusually weak or tired, fainting spells, lightheadedness -breathing problems -changes in hearing -changes in vision -chest pain -high blood pressure -low blood counts - This drug may decrease the number of white blood cells, red blood cells and platelets. You may be at increased risk for infections and bleeding. -nausea and vomiting -pain, swelling, redness or irritation at the injection site -pain, tingling, numbness in the hands or feet -problems with balance, talking, walking -trouble passing urine or change in the amount of urine Side effects that usually do not require medical attention (report to your doctor or health care professional if they continue or are bothersome): -hair loss -loss of appetite -metallic taste in the mouth or changes in taste This list may not describe all possible side effects. Call your doctor for medical advice about side effects. You may report side effects to FDA at 1-800-FDA-1088. Where should I keep my medicine? This drug is given in a hospital or clinic and will not be stored at home. NOTE: This sheet is a summary. It may not cover all possible information. If you have questions about this medicine, talk to your doctor, pharmacist, or health care provider.  2018 [employer] Standard (2007-12-02 14:38:05) Paclitaxel injection What is this medicine? PACLITAXEL (PAK li TAX el) is a chemotherapy drug. It targets fast dividing cells, like cancer cells, and causes these cells to die. This medicine is used to treat ovarian cancer, breast  cancer, and other cancers. This medicine may be used for other purposes; ask your health care provider or pharmacist if you have questions. COMMON BRAND NAME(S): Onxol, Taxol What should I tell my health care provider before I take this medicine? They need to know if you have any of these conditions: -blood disorders -irregular heartbeat -infection (especially a virus infection such as chickenpox, cold sores, or herpes) -liver disease -previous or ongoing radiation therapy -an unusual or allergic reaction to paclitaxel, alcohol, polyoxyethylated castor oil, other chemotherapy agents, other medicines, foods, dyes, or preservatives -pregnant or trying to get pregnant -breast-feeding How should I use this medicine? This drug is given as an infusion into a vein. It is administered in a hospital or clinic by a specially trained health care professional. Talk to your pediatrician regarding the use of this medicine in children. Special care may be needed. Overdosage: If you think you have taken too much of this medicine contact a poison control center or emergency room at once. NOTE: This medicine is only for you. Do not share this medicine with others. What if I miss a dose? It is important not to miss your dose. Call your doctor or health care professional if you are unable to keep an appointment. What may interact with this medicine? Do not take this medicine with any of the following medications: -disulfiram -metronidazole This medicine may also interact with the following medications: -cyclosporine -diazepam -ketoconazole -medicines to increase blood counts like filgrastim, pegfilgrastim, sargramostim -other chemotherapy drugs like cisplatin,   doxorubicin, epirubicin, etoposide, teniposide, vincristine -quinidine -testosterone -vaccines -verapamil Talk to your doctor or health care professional before taking any of these  medicines: -acetaminophen -aspirin -ibuprofen -ketoprofen -naproxen This list may not describe all possible interactions. Give your health care provider a list of all the medicines, herbs, non-prescription drugs, or dietary supplements you use. Also tell them if you smoke, drink alcohol, or use illegal drugs. Some items may interact with your medicine. What should I watch for while using this medicine? Your condition will be monitored carefully while you are receiving this medicine. You will need important blood work done while you are taking this medicine. This medicine can cause serious allergic reactions. To reduce your risk you will need to take other medicine(s) before treatment with this medicine. If you experience allergic reactions like skin rash, itching or hives, swelling of the face, lips, or tongue, tell your doctor or health care professional right away. In some cases, you may be given additional medicines to help with side effects. Follow all directions for their use. This drug may make you feel generally unwell. This is not uncommon, as chemotherapy can affect healthy cells as well as cancer cells. Report any side effects. Continue your course of treatment even though you feel ill unless your doctor tells you to stop. Call your doctor or health care professional for advice if you get a fever, chills or sore throat, or other symptoms of a cold or flu. Do not treat yourself. This drug decreases your body's ability to fight infections. Try to avoid being around people who are sick. This medicine may increase your risk to bruise or bleed. Call your doctor or health care professional if you notice any unusual bleeding. Be careful brushing and flossing your teeth or using a toothpick because you may get an infection or bleed more easily. If you have any dental work done, tell your dentist you are receiving this medicine. Avoid taking products that contain aspirin, acetaminophen, ibuprofen,  naproxen, or ketoprofen unless instructed by your doctor. These medicines may hide a fever. Do not become pregnant while taking this medicine. Women should inform their doctor if they wish to become pregnant or think they might be pregnant. There is a potential for serious side effects to an unborn child. Talk to your health care professional or pharmacist for more information. Do not breast-feed an infant while taking this medicine. Men are advised not to father a child while receiving this medicine. This product may contain alcohol. Ask your pharmacist or healthcare provider if this medicine contains alcohol. Be sure to tell all healthcare providers you are taking this medicine. Certain medicines, like metronidazole and disulfiram, can cause an unpleasant reaction when taken with alcohol. The reaction includes flushing, headache, nausea, vomiting, sweating, and increased thirst. The reaction can last from 30 minutes to several hours. What side effects may I notice from receiving this medicine? Side effects that you should report to your doctor or health care professional as soon as possible: -allergic reactions like skin rash, itching or hives, swelling of the face, lips, or tongue -low blood counts - This drug may decrease the number of white blood cells, red blood cells and platelets. You may be at increased risk for infections and bleeding. -signs of infection - fever or chills, cough, sore throat, pain or difficulty passing urine -signs of decreased platelets or bleeding - bruising, pinpoint red spots on the skin, black, tarry stools, nosebleeds -signs of decreased red blood cells - unusually weak or   tired, fainting spells, lightheadedness -breathing problems -chest pain -high or low blood pressure -mouth sores -nausea and vomiting -pain, swelling, redness or irritation at the injection site -pain, tingling, numbness in the hands or feet -slow or irregular heartbeat -swelling of the ankle,  feet, hands Side effects that usually do not require medical attention (report to your doctor or health care professional if they continue or are bothersome): -bone pain -complete hair loss including hair on your head, underarms, pubic hair, eyebrows, and eyelashes -changes in the color of fingernails -diarrhea -loosening of the fingernails -loss of appetite -muscle or joint pain -red flush to skin -sweating This list may not describe all possible side effects. Call your doctor for medical advice about side effects. You may report side effects to FDA at 1-800-FDA-1088. Where should I keep my medicine? This drug is given in a hospital or clinic and will not be stored at home. NOTE: This sheet is a summary. It may not cover all possible information. If you have questions about this medicine, talk to your doctor, pharmacist, or health care provider.  2018 [employer] Standard (2015-06-28 19:58:00)  

## 2018-06-09 NOTE — Progress Notes (Signed)
Melissa Simpson CONSULT NOTE  Patient Care Team: Melissa Curry, DO as PCP - General (Geriatric Medicine)  ASSESSMENT & PLAN:  Left ovarian epithelial cancer [facility]) We discussed the role of staging as well as adjuvant treatment Given spill of tumor noted at the time of surgery and colonic wall thickening noted on original CT scan, I recommend CT scan of the chest, abdomen and pelvis for staging I recommend genetic counseling I recommend chemotherapy class consent and chemotherapy to start in approximately 4 weeks from the date of surgery, meaning around the second week of October She is interested for hair saving measures and I will research more about the use of Dignicap I will see her back before the start of treatment for further discussion and management.   Preventive measure We discussed the importance of preventive care and reviewed the vaccination programs. She does not have any prior allergic reactions to influenza vaccination. She agrees to proceed with influenza vaccination today and we will administer it today at the clinic.    Orders Placed This Encounter  Procedures  . CT CHEST W CONTRAST    Standing Status:   Future    Standing Expiration Date:   06/06/2019    Order Specific Question:   If indicated for the ordered procedure, I authorize the administration of contrast media per Radiology protocol    Answer:   Yes    Order Specific Question:   Preferred imaging location?    Answer:   [facility]    Order Specific Question:   Radiology Contrast Protocol - do NOT remove file path    Answer:   \\[email or web address]  . CT ABDOMEN PELVIS W CONTRAST    Standing Status:   Future    Standing Expiration Date:   06/06/2019    Order Specific Question:   If indicated for the ordered procedure, I authorize the administration of contrast media per Radiology protocol    Answer:   Yes    Order Specific Question:   Preferred imaging location?     Answer:   [facility]    Order Specific Question:   Radiology Contrast Protocol - do NOT remove file path    Answer:   \\[email or web address]    Order Specific Question:   ** REASON FOR EXAM (FREE TEXT)    Answer:   ovarian ca s/p debulking, colonic thickening noted on orginal CT  . Ambulatory referral to Genetics    Referral Priority:   Routine    Referral Type:   Consultation    Referral Reason:   Specialty Services Required    Number of Visits Requested:   1     CHIEF COMPLAINTS/PURPOSE OF CONSULTATION:  Left ovarian cancer, for further management  HISTORY OF PRESENTING ILLNESS:  Melissa Simpson 67 y.o. female is here because of recent diagnosis of ovarian cancer. Her husband is present today. His symptoms began with weight loss and abdominal discomfort. I have reviewed her chart and materials related to her cancer extensively and collaborated history with the patient. Summary of oncologic history is as follows: Oncology History   Surgery at [place] [facility]     Left ovarian epithelial cancer ([place])   03/17/2018 Initial Diagnosis    Presented to see primary doctor for weight loss and abdominal discomfort    04/29/2018 Imaging    1. Large pelvic mass with heterogeneous appearance, measuring at least 15 centimeters in diameter. Although the findings favor enlarged uterus/fibroids, ovaries are  not well seen and an ovarian mass should also be considered. Further characterization with pelvic ultrasound is recommended. 2. The bladder is completely decompressed. There is LEFT-sided hydronephrosis secondary to extrinsic compression from the pelvic mass. 3. Irregular thickening of the sigmoid colon, suspicious for colonic wall mass, measuring 1.3 centimeters. In this patient with a family history of colon cancer, further evaluation with colonoscopy is recommended. 4. Colonic diverticula without acute diverticulitis. 5. Moderate hiatal hernia.      05/15/2018 Imaging    MRI pelvis 16 cm complex cystic and solid mass which is centered in the left posterior adnexa and is contiguous with the uterus. This is suspicious for ovarian carcinoma, with differential diagnosis of atypical pedunculated fibroid with peripheral cystic degeneration. Surgical evaluation is recommended.  Other uterine fibroids measuring up to 4.8 cm.  No evidence of pelvic metastatic disease.     05/16/2018 Imaging    US pelvis 1. Large pelvic mass described in detail on MRI examination 1 day prior.  2. Slight ureteral prominence bilaterally. Mild fullness of the left renal collecting system. No obstructing focus evident. Suspect fullness of these structures due to ureteral compression by the sizable pelvic mass.  3.  Study otherwise unremarkable.     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, with necrosis  - Carcinoma is adherent to fallopian tube, consistent with surface involvement (stage pT1c2) - Focal endometriosis present - See synoptic report and comment  B: Uterus with cervix and right ovary and fallopian tube, hysterectomy and right salpingo-oophorectomy Cervix: Benign with Nabothian cysts Endometrium: Endometrial polyp (size 3.5 cm); inactive endometrium with cystic glandular changes Myometrium: Leiomyomata with hyalinization and calcification (size up to 4.2 cm); focal adenomyosis  Right ovary: Benign physiologic changes Right fallopian tube: Small paratubal cyst and no malignancy identified  C: Lymph nodes, right pelvic, regional resection - Nine lymph nodes with no metastatic carcinoma identified (0/9)  D: Lymph nodes, left pelvic, regional resection - Five lymph nodes with no metastatic carcinoma  identified (0/5)  E: Cul-de-sac, anterior, biopsy - Fibroadipose tissue with no carcinoma identified  F: Cul-de-sac, posterior, biopsy - Fibrous tissue with inflammation and no carcinoma identified  G: Pelvic sidewall, right, biopsy - Fibroadipose tissue with focal crushed CD10 positive cells, cannot exclude endometrial type stroma - No carcinoma identified  H: Pelvic sidewall, left, biopsy - Fibroadipose tissue with focal crushed cells, cannot exclude endometrial type stroma - No carcinoma identified  I: Omentum, omentectomy - Adipose and fibrovascular tissue consistent with omentum - No carcinoma identified  J: Lymph nodes, left periaortic, regional resection - Two small lymph nodes with no metastatic carcinoma identified (0/2)  K: Lymph nodes, right periaortic, regional resection - Three small lymph nodes with no metastatic carcinoma identified (0/3)  This electronic signature is attestation that the pathologist personally reviewed the submitted material(s) and the final diagnosis reflects that evaluation.    Synoptic Report     OVARY or FALLOPIAN TUBE or PRIMARY PERITONEUM(Ovary FT Perit - All Specimens)    SPECIMEN  Procedure:Total hysterectomy and bilateral salpingo-oophorectomy   Procedure:Omentectomy   Procedure:Peritonealbiopsies   Procedure:Peritoneal washing   :  Specimen Integrity of Left Ovary:Received largely intact with multiple areas of disruption   Tumor Site:Left ovary   Histologic Type:Clear cell  carcinoma   Histologic Grade:High grade   :  Tumor Size:Greatest dimension in Centimeters (cm): 14.0 Centimeters (cm)  Additional Dimension in Centimeters (cm):10 Centimeters (cm)  Additional Dimension in Centimeters (cm):7 Centimeters (cm)  Tumor Extent:  Ovarian Surface Involvement:Present   Laterality:Left   Other Tissue / Organ Involvement:Not  identified   Peritoneal Ascitic Fluid:Negative for malignancy (normal / benign)   Pleural Fluid:Not submitted / unknown   Accessory Findings:  LYMPH NODES  Regional Lymph Nodes:All lymph nodes negative for tumor cells   Number of Lymph Nodes Examined:19   Site(s):Right pelvic: 9   Site(s):Left pelvic: 5   Site(s):Right para-aortic: 3   Site(s):Left para-aortic: 2   PATHOLOGIC STAGE CLASSIFICATION (pTNM, AJCC 8th Edition)  Primary Tumor (pT):pT1c2   Regional Lymph Nodes (pN):pN0   FIGO STAGE  FIGO Stage:IC2   ADDITIONAL FINDINGS  Additional Pathologic Findings:Endometriosis   Additional Pathologic Findings:Benign fallopian tube with adherence to carcinoma; also see additional parts   Comment(s)  Comment(s):Also see diagnosis comment.   The frozen section diagnosis is confirmed for specimen A. Sections demonstrate a carcinoma with tubulocystic and papillary architecture with hyalinized cores, lined by cells with high grade nuclei, clear to eosinophilic cytoplasm, and nuclear hobnailing.  The morphologic features are most suggestive of clear cell carcinoma.  Immunohistochemical stains are performed, and demonstrate that the carcinoma is positive for CK7 and PAX8, consistent with gynecologic origin. It has patchy positivity for Napsin A, wild type p53 staining, and is negative for ER, supporting the diagnosis of clear cell carcinoma.   Sections of the tumor demonstrate carcinoma with adherence to fallopian tube surface, consistent with surface involvement.  Per the operative note, findings were also suggestive of preoperative rupture of the mass.  Given both these findings, the tumor is staged as a pT1c2 (FIGO IC2)             05/27/2018 Surgery    Preoperative Diagnoses: Pelvic mass  Postoperative Diagnoses: left fallopian tube and ovary consistent with high-grade carcinoma of likely GYN origin. No evidence of  metastatic disease.  Procedures: Exploratory laparotomy, total abdominal hysterectomy, bilateral salpingo-oophorectomy, peritoneal biopsies for ovarian cancer staging, bilateral pelvic lymphadenectomy, bilateral para-aortic lymphadenectomy, infracolic omentectomy, R0 resection   Surgeon: Marti Sleigh, MD  Assistants: Tona Sensing, MD - Fellow, Haroldine Laws, MD - Resident, Feliberto Harts, PA-S  Findings: On BME, the cervix is retracted anteriorly with no palpable adnexal fullness. There is no RV septum nodularity. Abdominal exam reveals a mobile large mass that extends to the umbilicus and nearly to the pelvic side walls. On abdominal entry, there was immediate green-brown pelvic fluid that appeared consistent with a spontaneous rupture of the cystic fluid-filled mass. The left ovary was a large cystic complex mass, roughly 15cm in size with smooth surface but containing friable tumor. The mass was densely adherent along the entire posterior wall of the uterus. The uterus had some small intramural fibroids and was roughly 10cm in total size. The right fallopian tube and ovary were normal appearing. The patient has smooth paracolic gutters, liver edge, diaphragm, spleen and stomach. The omentum was retracted but no evidence of nodularity or gross tumor. The anterior cul-de-sac was clear and the posterior-cul-de-sac peritoneum was clear, but the left ovarian mass wall was adherent to the rectosigmoid and cul-de-sac. There were no palpable pelvic or para-aortic adenopathy.   Of note, an the conclusion of the case, there was mild RIGHT hydronephrosis noted. The ureter was traced from the level of the right common iliac into  the anterior utero-vesical ligament. There was an area at the pelvic brim were the hydronephrosis resolved to normal caliber. There was no tethering, stricture, or apparent injury to the ureter. Based on exam, this seemed consistent with packing and retraction causing some  transient dilation that resolved with packing and retractor removal.   Intraoperative frozen section of the left ovary was consistent with high-grade carcinoma of GYN origin, possibly serous. There was no gross evidence of disease at the conclusion of the case, R0 resection.  Specimens: 1) Pelvic washings 2) Uterus with cervix  3) Left ovary and fallopian tube, IOFS c/w high-grade GYN carcinoma 4) Right fallopian tube and ovary 5) Peritoneal biopsies - anterior cul-de-sac peritoneum, posterior cul-de-sac peritoneum, left pelvic side wall peritoneum, right pelvic side wall peritoneum 6) Right and left pelvic lymph nodes 7) Right and left para-aortic lymph nodes 8) Omentum 9) Anaerobic/Aerobic culture - preliminary read is no organisms     06/04/2018 Procedure    She had port placement    06/05/2018 Cancer Staging    Staging form: Ovary, Fallopian Tube, and Primary Peritoneal Carcinoma, AJCC 8th Edition - Pathologic: FIGO Stage IC2, calculated as Stage IC (pT1c2, pN0, cM0) - Signed by Heath Lark, MD on 06/05/2018    She is recovering well from surgery.  She has some minimal oozing coming at the lower incision site.  She denies recent fever or chills. She has lost some weight but appetite is improving. She denies recent constipation or nausea.  MEDICAL HISTORY:  Past Medical History:  Diagnosis Date  . Cancer ([place])    left ovarian cancer  . GERD (gastroesophageal reflux disease)   . Hyperlipidemia   . Hypertension     SURGICAL HISTORY: Past Surgical History:  Procedure Laterality Date  . ABDOMINAL HYSTERECTOMY    . BIOPSY THYROID  2008  . COLONOSCOPY    . TONSILECTOMY, ADENOIDECTOMY, BILATERAL MYRINGOTOMY AND TUBES Bilateral 1957    SOCIAL HISTORY: Social History   Socioeconomic History  . Marital status: Married    Spouse name: Louie Casa  . Number of children: 3  . Years of education: Not on file  . Highest education level: Not on file  Occupational History  .  Occupation: retired Solicitor  Social Needs  . Financial resource strain: Not hard at all  . Food insecurity:    Worry: Never true    Inability: Never true  . Transportation needs:    Medical: No    Non-medical: No  Tobacco Use  . Smoking status: Never Smoker  . Smokeless tobacco: Never Used  Substance and Sexual Activity  . Alcohol use: No  . Drug use: No  . Sexual activity: Not on file  Lifestyle  . Physical activity:    Days per week: 0 days    Minutes per session: 0 min  . Stress: Only a little  Relationships  . Social connections:    Talks on phone: Never    Gets together: More than three times a week    Attends religious service: 1 to 4 times per year    Active member of club or organization: No    Attends meetings of clubs or organizations: Never    Relationship status: Married  . Intimate partner violence:    Fear of current or ex partner: No    Emotionally abused: No    Physically abused: No    Forced sexual activity: No  Other Topics Concern  . Not on file  Social History Narrative  Diet:Unrestricted   Do you drink/eat things with caffeine? Rarely   Marital status: Married                             What year were you married? 1992   Do you live in a house, apartment, assisted living, condo, trailer, etc)? House   Is it one or more stories? 1   How many persons live in your home? 3   Do you have any pets in your home?  2 small dogs   Current or past profession: Chief of Staff   Do you exercise?                                                     Type & how often:   Do you have a living will? No   Do you have a DNR Form? No   Do you have a POA/HPOA forms? NO    FAMILY HISTORY: Family History  Problem Relation Age of Onset  . Hypertension Father   . Diabetes Father   . Coronary artery disease Father   . Hyperlipidemia Father   . Colon cancer Father 77  . Hypertension Mother   . Arthritis Mother     ALLERGIES:  has No Known  Allergies.  MEDICATIONS:  Current Outpatient Medications  Medication Sig Dispense Refill  . acetaminophen (TYLENOL) 325 MG tablet Take 650 mg by mouth every 6 (six) hours as needed.    Marland Kitchen alendronate (FOSAMAX) 70 MG tablet TAKE 1 TABLET EVERY WEEK 12 tablet 3  . apixaban (ELIQUIS) 2.5 MG TABS tablet Take 2.5 mg by mouth 2 (two) times daily. Taking for 1 more week as of 06/09/18    . docusate sodium (COLACE) 100 MG capsule Take 100 mg by mouth 2 (two) times daily.    Marland Kitchen ibuprofen (ADVIL,MOTRIN) 600 MG tablet Take 600 mg by mouth every 6 (six) hours as needed.    Marland Kitchen losartan-hydrochlorothiazide (HYZAAR) 100-12.5 MG tablet Take 1 tablet by mouth daily. 90 tablet 3  . metoprolol tartrate (LOPRESSOR) 50 MG tablet TAKE 1 TABLET TWICE DAILY 180 tablet 1  . Multiple Vitamin (MULTIVITAMIN) tablet Take 1 tablet by mouth 2 (two) times daily.     Marland Kitchen omeprazole (PRILOSEC) 20 MG capsule Take 1 capsule (20 mg total) by mouth daily. 90 capsule 3  . simvastatin (ZOCOR) 20 MG tablet Take 1 tablet (20 mg total) by mouth daily. 90 tablet 3   No current facility-administered medications for this visit.     REVIEW OF SYSTEMS:   Constitutional: Denies fevers, chills or abnormal night sweats Eyes: Denies blurriness of vision, double vision or watery eyes Ears, nose, mouth, throat, and face: Denies mucositis or sore throat Respiratory: Denies cough, dyspnea or wheezes Cardiovascular: Denies palpitation, chest discomfort or lower extremity swelling Gastrointestinal:  Denies nausea, heartburn or change in bowel habits Skin: Denies abnormal skin rashes Lymphatics: Denies new lymphadenopathy or easy bruising Neurological:Denies numbness, tingling or new weaknesses Behavioral/Psych: Mood is stable, no new changes  All other systems were reviewed with the patient and are negative.  PHYSICAL EXAMINATION: ECOG PERFORMANCE STATUS: 1 - Symptomatic but completely ambulatory  Vitals:   06/09/18 1341  BP: (!) 162/84   Pulse: (!) 54  Resp: 18  Temp: 97.6 F (36.4 C)  SpO2: 95%   Filed Weights   06/09/18 1341  Weight: 129 lb 3.2 oz (58.6 kg)    GENERAL:alert, no distress and comfortable SKIN: skin color, texture, turgor are normal, no rashes or significant lesions EYES: normal, conjunctiva are pink and non-injected, sclera clear OROPHARYNX:no exudate, no erythema and lips, buccal mucosa, and tongue normal  NECK: supple, thyroid normal size, non-tender, without nodularity LYMPH:  no palpable lymphadenopathy in the cervical, axillary or inguinal LUNGS: clear to auscultation and percussion with normal breathing effort HEART: regular rate & rhythm and no murmurs and no lower extremity edema ABDOMEN:abdomen soft, non-tender and normal bowel sounds.  Incision is well-healed.  Staples in situ Musculoskeletal:no cyanosis of digits and no clubbing  PSYCH: alert & oriented x 3 with fluent speech NEURO: no focal motor/sensory deficits  LABORATORY DATA:  I have reviewed the data as listed Lab Results  Component Value Date   WBC 10.9 (H) 03/12/2018   HGB 12.9 03/12/2018   HCT 38.3 03/12/2018   MCV 96.0 03/12/2018   PLT 540 (H) 03/12/2018   Recent Labs    09/05/17 0947 03/12/18 0906 05/15/18 0707  NA 140 137  --   K 4.1 4.3  --   CL 104 100  --   CO2 31 26  --   GLUCOSE 103* 101*  --   BUN 18 17  --   CREATININE 0.92 1.11* 2.40*  CALCIUM 9.2 9.3  --   GFRNONAA 64 51*  --   GFRAA 74 59*  --   PROT 6.5 7.0  --   AST 25 18  --   ALT 28 12  --   BILITOT 0.6 0.7  --     RADIOGRAPHIC STUDIES: I have personally reviewed the radiological images as listed and agreed with the findings in the report. Mr Pelvis Wo Contrast  Addendum Date: 05/16/2018   ADDENDUM REPORT: 05/16/2018 13:43 ADDENDUM: In addition to findings in report below, a 1.4 cm rounded mural density is seen in the fundus of the gallbladder which is suspicious for a gallbladder polyp. Abdomen ultrasound is recommended for further  evaluation of this finding. Electronically Signed   By: Earle Gell M.D.   On: 05/16/2018 13:43   Result Date: 05/16/2018 CLINICAL DATA:  Lower abdominal and pelvic pain. Pelvic mass. Elevated CA 125 level. EXAM: MRI PELVIS WITHOUT CONTRAST TECHNIQUE: Multiplanar multisequence MR imaging of the pelvis was performed. No intravenous contrast was administered. COMPARISON:  CT on 04/29/2018 FINDINGS: Urinary Tract: No urinary bladder or urethral abnormality. Bowel: Unremarkable pelvic bowel loops. Vascular/Lymphatic: Unremarkable. No pathologically enlarged pelvic lymph nodes identified. Reproductive: -- Uterus: Measures 14.3 by 6.9 x 9.6 cm (volume = 500 cm^3). Four distinct fibroids are seen in the uterine body and fundus. Largest is submucosal in location measuring 4.8 cm. Another fibroid is pedunculated and arising from the right fundal region measuring 3.7 cm. No abnormal endometrial thickening seen. Cervix is unremarkable. The uterus is displaced anteriorly and to the right by the left pelvic mass described below. -- Right ovary: Not visualized, however no right-sided adnexal mass identified. -- Left ovary: No normal ovary visualized. A large mass is seen in the left post adnexal and cul-de-sac regions which measures 16.2 x 8.4 x 11.6 cm. This is contiguous with the uterus and is well-circumscribed. This mass contains a large internal solid component which shows areas of T2 hypointensity and hyperintensity, and cystic component throughout the periphery of this lesion which shows several internal septations. No internal fat  is seen. This is suspicious for ovarian carcinoma, although differential diagnosis also includes atypical pedunculated fibroid with peripheral cystic degeneration. There is no evidence of invasion of adjacent structures. Other: No peritoneal thickening or abnormal free fluid. Musculoskeletal:  Unremarkable. IMPRESSION: 16 cm complex cystic and solid mass which is centered in the left posterior  adnexa and is contiguous with the uterus. This is suspicious for ovarian carcinoma, with differential diagnosis of atypical pedunculated fibroid with peripheral cystic degeneration. Surgical evaluation is recommended. Other uterine fibroids measuring up to 4.8 cm. No evidence of pelvic metastatic disease. Electronically Signed: By: Earle Gell M.D. On: 05/15/2018 11:05   US Renal  Result Date: 05/16/2018 CLINICAL DATA:  Elevated creatinine.  Known pelvic mass EXAM: RENAL / URINARY TRACT ULTRASOUND COMPLETE COMPARISON:  CT abdomen and pelvis April 29, 2018; pelvic MRI May 15, 2018 FINDINGS: Right Kidney: Length: 9.4 cm. Echogenicity and renal cortical thickness are within normal limits. No mass, perinephric fluid, or hydronephrosis visualized. No demonstrable calculus by ultrasound. There is prominence of the proximal right ureter. Left Kidney: Length: 10.2 cm. Echogenicity and renal cortical thickness are within normal limits. No mass or perinephric fluid visualized. There is mild fullness of the left renal collecting system and proximal ureter. No calculus evident. Bladder: Appears normal for degree of bladder distention. There is a large complex pelvic mass measuring 16.3 x 8.8 x 1.1 cm which shows evidence of vascular flow. IMPRESSION: 1. Large pelvic mass described in detail on MRI examination 1 day prior. 2. Slight ureteral prominence bilaterally. Mild fullness of the left renal collecting system. No obstructing focus evident. Suspect fullness of these structures due to ureteral compression by the sizable pelvic mass. 3.  Study otherwise unremarkable. Electronically Signed   By: Lowella Grip III M.D.   On: 05/16/2018 11:52    I spent 60 minutes counseling the patient face to face. The total time spent in the appointment was 80 minutes and more than 50% was on counseling.  All questions were answered. The patient knows to call the clinic with any problems, questions or concerns.  Heath Lark,  MD 06/09/2018 4:11 PM

## 2018-06-09 NOTE — Assessment & Plan Note (Signed)
We discussed the importance of preventive care and reviewed the vaccination programs. She does not have any prior allergic reactions to influenza vaccination. She agrees to proceed with influenza vaccination today and we will administer it today at the clinic.  

## 2018-06-09 NOTE — Telephone Encounter (Signed)
Gave avs and calendar ° °

## 2018-06-09 NOTE — Progress Notes (Signed)
START ON PATHWAY REGIMEN - Ovarian     A cycle is every 21 days:     Paclitaxel      Carboplatin   **Always confirm dose/schedule in your pharmacy ordering system**  Patient Characteristics: Postoperative without Neoadjuvant Therapy (Pathologic Staging), Newly Diagnosed, Adjuvant Therapy, Any Stage I, Grade 3 Therapeutic Status: Postoperative without Neoadjuvant Therapy (Pathologic Staging) BRCA Mutation Status: Awaiting Test Results AJCC 8 Stage Grouping: IC AJCC M Category: cM0 AJCC T Category: pT1c AJCC N Category: pN0 Tumor Grade: 3 Intent of Therapy: Curative Intent, Discussed with Patient 

## 2018-06-10 ENCOUNTER — Ambulatory Visit: Payer: Medicare HMO | Admitting: Hematology and Oncology

## 2018-06-10 ENCOUNTER — Telehealth: Payer: Self-pay | Admitting: Oncology

## 2018-06-10 NOTE — Telephone Encounter (Signed)
Called [place] and discussed the [place].  Forwarded the patient booklet and next steps bookmark to her email at her request.  She is going to the review the information/website and let us know if she is interested.

## 2018-06-10 NOTE — Progress Notes (Signed)
Patient seen during appointment with Dr. Alvy Bimler.  31 staples removed from the midline incision without difficulty.  Mild erythema present around insertion site for staples.  Minimal amount of serous fluid draining from the lower aspect of the incision.  No significant fluid collection palpated underneath.  1/2 inch steri strips applied.  Reportable signs and symptoms reviewed.  No evidence of infection.  She is to follow up with Dr. C-P as scheduled or sooner if needed and is advised to call for any needs.

## 2018-06-11 NOTE — Telephone Encounter (Signed)
Melissa Simpson called back and said she has discussed the [place] with her husband and reviewed the website.  She is still trying to decide and said she can let me know tomorrow morning.  Discussed with her that Dr. Alvy Bimler needs her decision as soon as possible in case chemo needs to be rescheduled.  She verbalized understanding and said she would let us know tomorrow morning.

## 2018-06-11 NOTE — Telephone Encounter (Signed)
Left a message for patient asking if she has made a decision about the Dignicap.  Requested a return call.

## 2018-06-12 ENCOUNTER — Telehealth: Payer: Self-pay | Admitting: Oncology

## 2018-06-12 ENCOUNTER — Encounter: Payer: Self-pay | Admitting: Hematology and Oncology

## 2018-06-12 NOTE — Telephone Encounter (Signed)
Avalin called and said she wants to try to the [place].  Advised her that I will let Dr. Alvy Bimler know.

## 2018-06-12 NOTE — Telephone Encounter (Signed)
Called Melissa Simpson back and let her know Dr. Alvy Bimler has provided a letter of medical necessity so that she can submit it to her insurance.  She asked for the letter to be emailed to her.  Also discussed that she will purchase the Dignicap kit with the cap.  She also was advised that her chemo infusion time will be increased and that we will let her know the new times.  She verbalized understanding and agreement.

## 2018-06-18 ENCOUNTER — Telehealth: Payer: Self-pay | Admitting: Oncology

## 2018-06-18 NOTE — Telephone Encounter (Signed)
Called Melissa Simpson back and advised her that the CT scan has been authorized.  Gave her the phone number for central scheduling to make the appointment.

## 2018-06-18 NOTE — Telephone Encounter (Signed)
Melissa Simpson called and asked when her CT scan will be scheduled.  Advised her that it is being authorized now and that we will schedule it as soon as it is cleared.  She also asked if she needs to come in for the lab/port flush appointment on Friday, 06/27/18.  She asked if she can go on a business trip with her husband after her first chemo on 10/22 for 3 days.  Advised her that I will ask Dr. Alvy Bimler and call her back.

## 2018-06-19 ENCOUNTER — Telehealth: Payer: Self-pay | Admitting: Oncology

## 2018-06-19 DIAGNOSIS — C569 Malignant neoplasm of unspecified ovary: Secondary | ICD-10-CM | POA: Diagnosis not present

## 2018-06-19 NOTE — Telephone Encounter (Signed)
Melissa Simpson with CT chest, abd, pelvis appointment on 06/27/18 at 12:30 pm.  Advised her that she needs to have labs drawn at 10:00 am and also that she needs to not eat or drink anything 4 hours before the scan.  Discussed that she needs to drink the first bottle of contrast at 10:30 and the second at 11:30.  She verbalized understanding and agreement.  She also said she had received her Dignicap.

## 2018-06-24 ENCOUNTER — Other Ambulatory Visit: Payer: Self-pay | Admitting: Hematology and Oncology

## 2018-06-27 ENCOUNTER — Encounter (HOSPITAL_COMMUNITY): Payer: Self-pay

## 2018-06-27 ENCOUNTER — Inpatient Hospital Stay: Payer: Medicare HMO

## 2018-06-27 ENCOUNTER — Ambulatory Visit (HOSPITAL_COMMUNITY)
Admission: RE | Admit: 2018-06-27 | Discharge: 2018-06-27 | Disposition: A | Discharge status: Home / Self Care | Payer: Medicare HMO | Source: Ambulatory Visit | Attending: Hematology and Oncology | Admitting: Hematology and Oncology

## 2018-06-27 ENCOUNTER — Inpatient Hospital Stay: Payer: Medicare HMO | Attending: Obstetrics

## 2018-06-27 DIAGNOSIS — C562 Malignant neoplasm of left ovary: Secondary | ICD-10-CM | POA: Insufficient documentation

## 2018-06-27 DIAGNOSIS — Z800 Family history of malignant neoplasm of digestive organs: Secondary | ICD-10-CM | POA: Diagnosis not present

## 2018-06-27 DIAGNOSIS — E041 Nontoxic single thyroid nodule: Secondary | ICD-10-CM | POA: Insufficient documentation

## 2018-06-27 DIAGNOSIS — K449 Diaphragmatic hernia without obstruction or gangrene: Secondary | ICD-10-CM | POA: Diagnosis not present

## 2018-06-27 DIAGNOSIS — I700 Atherosclerosis of aorta: Secondary | ICD-10-CM | POA: Diagnosis not present

## 2018-06-27 DIAGNOSIS — Z79899 Other long term (current) drug therapy: Secondary | ICD-10-CM | POA: Insufficient documentation

## 2018-06-27 DIAGNOSIS — C801 Malignant (primary) neoplasm, unspecified: Secondary | ICD-10-CM

## 2018-06-27 DIAGNOSIS — Z95828 Presence of other vascular implants and grafts: Secondary | ICD-10-CM

## 2018-06-27 DIAGNOSIS — Z5111 Encounter for antineoplastic chemotherapy: Secondary | ICD-10-CM | POA: Diagnosis not present

## 2018-06-27 LAB — COMPREHENSIVE METABOLIC PANEL
ALBUMIN: 3.7 g/dL (ref 3.5–5.0)
ALK PHOS: 42 U/L (ref 38–126)
ALT: 16 U/L (ref 0–44)
AST: 20 U/L (ref 15–41)
Anion gap: 10 (ref 5–15)
BILIRUBIN TOTAL: 0.7 mg/dL (ref 0.3–1.2)
BUN: 17 mg/dL (ref 8–23)
CALCIUM: 9.7 mg/dL (ref 8.9–10.3)
CO2: 26 mmol/L (ref 22–32)
CREATININE: 1.07 mg/dL — AB (ref 0.44–1.00)
Chloride: 106 mmol/L (ref 98–111)
GFR calc Af Amer: 60 mL/min — ABNORMAL LOW (ref >60)
GFR, EST NON AFRICAN AMERICAN: 52 mL/min — AB (ref >60)
GLUCOSE: 102 mg/dL — AB (ref 70–99)
POTASSIUM: 4 mmol/L (ref 3.5–5.1)
Sodium: 142 mmol/L (ref 135–145)
TOTAL PROTEIN: 7.3 g/dL (ref 6.5–8.1)

## 2018-06-27 LAB — CBC WITH DIFFERENTIAL/PLATELET
Abs Immature Granulocytes: 0.02 10*3/uL (ref 0.00–0.07)
BASOS ABS: 0.1 10*3/uL (ref 0.0–0.1)
Basophils Relative: 1 %
Eosinophils Absolute: 0.4 10*3/uL (ref 0.0–0.5)
Eosinophils Relative: 6 %
HEMATOCRIT: 40 % (ref 36.0–46.0)
HEMOGLOBIN: 13.1 g/dL (ref 12.0–15.0)
IMMATURE GRANULOCYTES: 0 %
LYMPHS ABS: 1.7 10*3/uL (ref 0.7–4.0)
LYMPHS PCT: 25 %
MCH: 30.2 pg (ref 26.0–34.0)
MCHC: 32.8 g/dL (ref 30.0–36.0)
MCV: 92.2 fL (ref 80.0–100.0)
MONOS PCT: 10 %
Monocytes Absolute: 0.7 10*3/uL (ref 0.1–1.0)
NEUTROS PCT: 58 %
NRBC: 0 % (ref 0.0–0.2)
Neutro Abs: 4 10*3/uL (ref 1.7–7.7)
Platelets: 313 10*3/uL (ref 150–400)
RBC: 4.34 MIL/uL (ref 3.87–5.11)
RDW: 15.3 % (ref 11.5–15.5)
WBC: 6.9 10*3/uL (ref 4.0–10.5)

## 2018-06-27 MED ORDER — IOHEXOL 300 MG/ML  SOLN
100.0000 mL | Freq: Once | INTRAMUSCULAR | Status: AC | PRN
  Administered 2018-06-27: 100 mL via INTRAVENOUS

## 2018-06-27 MED ORDER — SODIUM CHLORIDE 0.9% FLUSH
10.0000 mL | INTRAVENOUS | Status: DC | PRN
  Administered 2018-06-27: 10 mL via INTRAVENOUS
  Filled 2018-06-27: qty 10

## 2018-06-27 MED ORDER — HEPARIN SOD (PORK) LOCK FLUSH 100 UNIT/ML IV SOLN
INTRAVENOUS | Status: AC
  Filled 2018-06-27: qty 5

## 2018-06-27 MED ORDER — SODIUM CHLORIDE 0.9 % IJ SOLN
INTRAMUSCULAR | Status: AC
  Filled 2018-06-27: qty 50

## 2018-06-27 MED ORDER — HEPARIN SOD (PORK) LOCK FLUSH 100 UNIT/ML IV SOLN
500.0000 [IU] | Freq: Once | INTRAVENOUS | Status: AC
  Administered 2018-06-27: 500 [IU] via INTRAVENOUS

## 2018-06-28 LAB — CA 125: CANCER ANTIGEN (CA) 125: 90.3 U/mL — AB (ref 0.0–38.1)

## 2018-06-30 ENCOUNTER — Inpatient Hospital Stay: Payer: Medicare HMO | Admitting: Hematology and Oncology

## 2018-06-30 ENCOUNTER — Inpatient Hospital Stay: Payer: Medicare HMO

## 2018-06-30 ENCOUNTER — Other Ambulatory Visit: Payer: Self-pay | Admitting: Hematology and Oncology

## 2018-06-30 ENCOUNTER — Encounter: Payer: Self-pay | Admitting: Genetics

## 2018-06-30 ENCOUNTER — Inpatient Hospital Stay (HOSPITAL_BASED_OUTPATIENT_CLINIC_OR_DEPARTMENT_OTHER): Payer: Medicare HMO | Admitting: Genetics

## 2018-06-30 VITALS — BP 150/75 | HR 63 | Temp 97.7°F | Resp 18 | Ht <= 58 in | Wt 130.0 lb

## 2018-06-30 DIAGNOSIS — C801 Malignant (primary) neoplasm, unspecified: Secondary | ICD-10-CM

## 2018-06-30 DIAGNOSIS — Z800 Family history of malignant neoplasm of digestive organs: Secondary | ICD-10-CM

## 2018-06-30 DIAGNOSIS — C562 Malignant neoplasm of left ovary: Secondary | ICD-10-CM

## 2018-06-30 DIAGNOSIS — J302 Other seasonal allergic rhinitis: Secondary | ICD-10-CM

## 2018-06-30 DIAGNOSIS — I898 Other specified noninfective disorders of lymphatic vessels and lymph nodes: Secondary | ICD-10-CM | POA: Diagnosis not present

## 2018-06-30 DIAGNOSIS — C569 Malignant neoplasm of unspecified ovary: Secondary | ICD-10-CM | POA: Diagnosis not present

## 2018-06-30 DIAGNOSIS — Z79899 Other long term (current) drug therapy: Secondary | ICD-10-CM | POA: Diagnosis not present

## 2018-06-30 DIAGNOSIS — Z8041 Family history of malignant neoplasm of ovary: Secondary | ICD-10-CM | POA: Diagnosis not present

## 2018-06-30 DIAGNOSIS — Z808 Family history of malignant neoplasm of other organs or systems: Secondary | ICD-10-CM | POA: Diagnosis not present

## 2018-06-30 DIAGNOSIS — Z5111 Encounter for antineoplastic chemotherapy: Secondary | ICD-10-CM | POA: Diagnosis not present

## 2018-06-30 DIAGNOSIS — E041 Nontoxic single thyroid nodule: Secondary | ICD-10-CM | POA: Diagnosis not present

## 2018-06-30 LAB — CBC WITH DIFFERENTIAL/PLATELET
Abs Immature Granulocytes: 0.02 10*3/uL (ref 0.00–0.07)
Basophils Absolute: 0.1 10*3/uL (ref 0.0–0.1)
Basophils Relative: 1 %
EOS ABS: 0.3 10*3/uL (ref 0.0–0.5)
EOS PCT: 3 %
HCT: 40.6 % (ref 36.0–46.0)
Hemoglobin: 13.2 g/dL (ref 12.0–15.0)
Immature Granulocytes: 0 %
Lymphocytes Relative: 24 %
Lymphs Abs: 1.9 10*3/uL (ref 0.7–4.0)
MCH: 30.8 pg (ref 26.0–34.0)
MCHC: 32.5 g/dL (ref 30.0–36.0)
MCV: 94.9 fL (ref 80.0–100.0)
MONO ABS: 0.7 10*3/uL (ref 0.1–1.0)
MONOS PCT: 9 %
Neutro Abs: 5.1 10*3/uL (ref 1.7–7.7)
Neutrophils Relative %: 63 %
PLATELETS: 296 10*3/uL (ref 150–400)
RBC: 4.28 MIL/uL (ref 3.87–5.11)
RDW: 15.4 % (ref 11.5–15.5)
WBC: 8.1 10*3/uL (ref 4.0–10.5)
nRBC: 0 % (ref 0.0–0.2)

## 2018-06-30 LAB — COMPREHENSIVE METABOLIC PANEL
ALT: 18 U/L (ref 0–44)
ANION GAP: 8 (ref 5–15)
AST: 21 U/L (ref 15–41)
Albumin: 3.8 g/dL (ref 3.5–5.0)
Alkaline Phosphatase: 39 U/L (ref 38–126)
BUN: 16 mg/dL (ref 8–23)
CO2: 30 mmol/L (ref 22–32)
CREATININE: 1.1 mg/dL — AB (ref 0.44–1.00)
Calcium: 9.9 mg/dL (ref 8.9–10.3)
Chloride: 103 mmol/L (ref 98–111)
GFR, EST AFRICAN AMERICAN: 58 mL/min — AB (ref >60)
GFR, EST NON AFRICAN AMERICAN: 50 mL/min — AB (ref >60)
Glucose, Bld: 82 mg/dL (ref 70–99)
Potassium: 4.3 mmol/L (ref 3.5–5.1)
Sodium: 141 mmol/L (ref 135–145)
Total Bilirubin: 0.7 mg/dL (ref 0.3–1.2)
Total Protein: 7.5 g/dL (ref 6.5–8.1)

## 2018-06-30 MED ORDER — PROCHLORPERAZINE MALEATE 10 MG PO TABS: 10.0000 mg | ORAL_TABLET | Freq: Four times a day (QID) | ORAL | 1 refills | Status: DC | PRN

## 2018-06-30 MED ORDER — TRIAMCINOLONE ACETONIDE 55 MCG/ACT NA AERO: 2.0000 | INHALATION_SPRAY | Freq: Two times a day (BID) | NASAL | 12 refills | Status: DC

## 2018-06-30 MED ORDER — LIDOCAINE-PRILOCAINE 2.5-2.5 % EX CREA: TOPICAL_CREAM | CUTANEOUS | 3 refills | Status: DC

## 2018-06-30 MED ORDER — DEXAMETHASONE 4 MG PO TABS: ORAL_TABLET | ORAL | 0 refills | Status: DC

## 2018-06-30 MED ORDER — ONDANSETRON HCL 8 MG PO TABS: 8.0000 mg | ORAL_TABLET | Freq: Three times a day (TID) | ORAL | 1 refills | Status: DC | PRN

## 2018-06-30 NOTE — Progress Notes (Signed)
REFERRING PROVIDER: Heath Lark, MD [place], [place] [id number]  PRIMARY PROVIDER:  Gayland Curry, DO  PRIMARY REASON FOR VISIT:  1. 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 Family history of ovarian cancer   3. Family history of colon cancer   4. Cancer [facility])     HISTORY OF PRESENT ILLNESS:   Melissa Simpson, a 67 y.o. female, was seen for a  cancer genetics consultation at the request of Dr. Alvy Bimler due to a personal and family history of cancer.  Melissa Simpson presents to clinic today to discuss the possibility of a hereditary predisposition to cancer, genetic testing, and to further clarify her future cancer risks, as well as potential cancer risks for family members.   In Sept 2019, at the age of 35, Melissa Simpson was diagnosed with left epithelial ovarian cancer. She underwent total hysterectomy on 05/27/2018. She is planning to start chemotherapy tomorrow (07/01/2018).   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 Large pelvic mass with heterogeneous appearance, measuring at least 15 centimeters in diameter. Although the findings favor enlarged uterus/fibroids, ovaries are not well seen and an ovarian mass should also be considered. Further characterization with pelvic ultrasound is recommended. 2. The bladder is completely decompressed. There is LEFT-sided hydronephrosis secondary to extrinsic compression from the pelvic mass. 3. Irregular thickening of the sigmoid colon, suspicious for colonic wall mass, measuring 1.3 centimeters. In this patient with a family history of colon cancer, further evaluation with colonoscopy is recommended. 4. Colonic diverticula without acute diverticulitis. 5. Moderate hiatal hernia.     05/15/2018 Imaging    MRI  pelvis 16 cm complex cystic and solid mass which is centered in the left posterior adnexa and is contiguous with the uterus. This is suspicious for ovarian carcinoma, with differential diagnosis of atypical pedunculated fibroid with peripheral cystic degeneration. Surgical evaluation is recommended.  Other uterine fibroids measuring up to 4.8 cm.  No evidence of pelvic metastatic disease.     05/16/2018 Imaging    US pelvis 1. Large pelvic mass described in detail on MRI examination 1 day prior.  2. Slight ureteral prominence bilaterally. Mild fullness of the left renal collecting system. No obstructing focus evident. Suspect fullness of these structures due to ureteral compression by the sizable pelvic mass.  3.  Study otherwise unremarkable.     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, with necrosis  - Carcinoma is adherent to fallopian tube, consistent with surface involvement (stage pT1c2) - Focal endometriosis present - See synoptic report and comment  B: Uterus with cervix and right ovary and fallopian tube, hysterectomy and right salpingo-oophorectomy Cervix: Benign with Nabothian cysts Endometrium: Endometrial polyp (size 3.5 cm); inactive endometrium with cystic glandular changes Myometrium: Leiomyomata with hyalinization and calcification (size up to 4.2 cm); focal adenomyosis  Right ovary: Benign physiologic changes Right fallopian tube: Small paratubal cyst and no malignancy identified  C: Lymph nodes, right pelvic, regional resection - Nine lymph nodes with no metastatic  carcinoma identified (0/9)  D: Lymph nodes, left pelvic, regional resection - Five lymph nodes with no metastatic carcinoma identified (0/5)  E: Cul-de-sac,  anterior, biopsy - Fibroadipose tissue with no carcinoma identified  F: Cul-de-sac, posterior, biopsy - Fibrous tissue with inflammation and no carcinoma identified  G: Pelvic sidewall, right, biopsy - Fibroadipose tissue with focal crushed CD10 positive cells, cannot exclude endometrial type stroma - No carcinoma identified  H: Pelvic sidewall, left, biopsy - Fibroadipose tissue with focal crushed cells, cannot exclude endometrial type stroma - No carcinoma identified  I: Omentum, omentectomy - Adipose and fibrovascular tissue consistent with omentum - No carcinoma identified  J: Lymph nodes, left periaortic, regional resection - Two small lymph nodes with no metastatic carcinoma identified (0/2)  K: Lymph nodes, right periaortic, regional resection - Three small lymph nodes with no metastatic carcinoma identified (0/3)  This electronic signature is attestation that the pathologist personally reviewed the submitted material(s) and the final diagnosis reflects that evaluation.    Synoptic Report     OVARY or FALLOPIAN TUBE or PRIMARY PERITONEUM(Ovary FT Perit - All Specimens)    SPECIMEN  Procedure:Total hysterectomy and bilateral salpingo-oophorectomy   Procedure:Omentectomy   Procedure:Peritonealbiopsies   Procedure:Peritoneal washing   :  Specimen Integrity of Left Ovary:Received largely intact with multiple areas of disruption   Tumor Site:Left ovary   Histologic Type:Clear cell carcinoma   Histologic Grade:High grade   :  Tumor Size:Greatest dimension in Centimeters (cm): 14.0 Centimeters (cm)  Additional Dimension in Centimeters (cm):10 Centimeters (cm)  Additional Dimension in Centimeters (cm):7 Centimeters (cm)  Tumor Extent:  Ovarian Surface Involvement:Present   Laterality:Left   Other Tissue / Organ Involvement:Not identified   Peritoneal Ascitic  Fluid:Negative for malignancy (normal / benign)   Pleural Fluid:Not submitted / unknown   Accessory Findings:  LYMPH NODES  Regional Lymph Nodes:All lymph nodes negative for tumor cells   Number of Lymph Nodes Examined:19   Site(s):Right pelvic: 9   Site(s):Left pelvic: 5   Site(s):Right para-aortic: 3   Site(s):Left para-aortic: 2   PATHOLOGIC STAGE CLASSIFICATION (pTNM, AJCC 8th Edition)  Primary Tumor (pT):pT1c2   Regional Lymph Nodes (pN):pN0   FIGO STAGE  FIGO Stage:IC2   ADDITIONAL FINDINGS  Additional Pathologic Findings:Endometriosis   Additional Pathologic Findings:Benign fallopian tube with adherence to carcinoma; also see additional parts   Comment(s)  Comment(s):Also see diagnosis comment.   The frozen section diagnosis is confirmed for specimen A. Sections demonstrate a carcinoma with tubulocystic and papillary architecture with hyalinized cores, lined by cells with high grade nuclei, clear to eosinophilic cytoplasm, and nuclear hobnailing.  The morphologic features are most suggestive of clear cell carcinoma.  Immunohistochemical stains are performed, and demonstrate that the carcinoma is positive for CK7 and PAX8, consistent with gynecologic origin. It has patchy positivity for Napsin A, wild type p53 staining, and is negative for ER, supporting the diagnosis of clear cell carcinoma.   Sections of the tumor demonstrate carcinoma with adherence to fallopian tube surface, consistent with surface involvement.  Per the operative note, findings were also suggestive of preoperative rupture of the mass.  Given both these findings, the tumor is staged as a pT1c2 (FIGO IC2)             05/27/2018 Surgery    Preoperative Diagnoses: Pelvic mass  Postoperative Diagnoses: left fallopian tube and ovary consistent with high-grade carcinoma of likely GYN origin. No evidence of metastatic disease.  Procedures:  Exploratory laparotomy, total abdominal hysterectomy, bilateral salpingo-oophorectomy, peritoneal biopsies  for ovarian cancer staging, bilateral pelvic lymphadenectomy, bilateral para-aortic lymphadenectomy, infracolic omentectomy, R0 resection   Surgeon: Marti Sleigh, MD  Assistants: Tona Sensing, MD - Fellow, Haroldine Laws, MD - Resident, Feliberto Harts, PA-S  Findings: On BME, the cervix is retracted anteriorly with no palpable adnexal fullness. There is no RV septum nodularity. Abdominal exam reveals a mobile large mass that extends to the umbilicus and nearly to the pelvic side walls. On abdominal entry, there was immediate green-brown pelvic fluid that appeared consistent with a spontaneous rupture of the cystic fluid-filled mass. The left ovary was a large cystic complex mass, roughly 15cm in size with smooth surface but containing friable tumor. The mass was densely adherent along the entire posterior wall of the uterus. The uterus had some small intramural fibroids and was roughly 10cm in total size. The right fallopian tube and ovary were normal appearing. The patient has smooth paracolic gutters, liver edge, diaphragm, spleen and stomach. The omentum was retracted but no evidence of nodularity or gross tumor. The anterior cul-de-sac was clear and the posterior-cul-de-sac peritoneum was clear, but the left ovarian mass wall was adherent to the rectosigmoid and cul-de-sac. There were no palpable pelvic or para-aortic adenopathy.   Of note, an the conclusion of the case, there was mild RIGHT hydronephrosis noted. The ureter was traced from the level of the right common iliac into the anterior utero-vesical ligament. There was an area at the pelvic brim were the hydronephrosis resolved to normal caliber. There was no tethering, stricture, or apparent injury to the ureter. Based on exam, this seemed consistent with packing and retraction causing some transient dilation that resolved with  packing and retractor removal.   Intraoperative frozen section of the left ovary was consistent with high-grade carcinoma of GYN origin, possibly serous. There was no gross evidence of disease at the conclusion of the case, R0 resection.  Specimens: 1) Pelvic washings 2) Uterus with cervix  3) Left ovary and fallopian tube, IOFS c/w high-grade GYN carcinoma 4) Right fallopian tube and ovary 5) Peritoneal biopsies - anterior cul-de-sac peritoneum, posterior cul-de-sac peritoneum, left pelvic side wall peritoneum, right pelvic side wall peritoneum 6) Right and left pelvic lymph nodes 7) Right and left para-aortic lymph nodes 8) Omentum 9) Anaerobic/Aerobic culture - preliminary read is no organisms     06/04/2018 Procedure    She had port placement    06/05/2018 Cancer Staging    Staging form: Ovary, Fallopian Tube, and Primary Peritoneal Carcinoma, AJCC 8th Edition - Pathologic: FIGO Stage IC2, calculated as Stage IC (pT1c2, pN0, cM0) - Signed by Heath Lark, MD on 06/05/2018    06/27/2018 Imaging    1. No evidence of residual/recurrent tumor in the pelvis. 2. No evidence of metastatic disease in the chest, abdomen or pelvis. 3. Left pelvic sidewall 6.3 x 5.0 cm simple fluid collection, compatible with postsurgical seroma or lymphocele. 4. Small hiatal hernia. 5. Aortic Atherosclerosis (ICD10-I70.0).    06/27/2018 Tumor Marker    Patient's tumor was tested for the following markers: CA-125 Results of the tumor marker test revealed 90.3      HORMONAL RISK FACTORS:  Menarche was at age 64.  First live birth at age 64.  Ovaries intact: no.  Hysterectomy: yes.  Menopausal status: postmenopausal.  Colonoscopy: yes;  a few polyps, told next one in 10 years. Mammogram within the last year: yes. Number of breast biopsies: 0.  Past Medical History:  Diagnosis Date  . Cancer [facility])    left ovarian  cancer  . Family history of colon cancer   . Family history of ovarian cancer   .  GERD (gastroesophageal reflux disease)   . Hyperlipidemia   . Hypertension     Past Surgical History:  Procedure Laterality Date  . ABDOMINAL HYSTERECTOMY    . BIOPSY THYROID  2008  . COLONOSCOPY    . TONSILECTOMY, ADENOIDECTOMY, BILATERAL MYRINGOTOMY AND TUBES Bilateral 1957    Social History   Socioeconomic History  . Marital status: Married    Spouse name: Louie Casa  . Number of children: 3  . Years of education: Not on file  . Highest education level: Not on file  Occupational History  . Occupation: retired Solicitor  Social Needs  . Financial resource strain: Not hard at all  . Food insecurity:    Worry: Never true    Inability: Never true  . Transportation needs:    Medical: No    Non-medical: No  Tobacco Use  . Smoking status: Never Smoker  . Smokeless tobacco: Never Used  Substance and Sexual Activity  . Alcohol use: No  . Drug use: No  . Sexual activity: Not on file  Lifestyle  . Physical activity:    Days per week: 0 days    Minutes per session: 0 min  . Stress: Only a little  Relationships  . Social connections:    Talks on phone: Never    Gets together: More than three times a week    Attends religious service: 1 to 4 times per year    Active member of club or organization: No    Attends meetings of clubs or organizations: Never    Relationship status: Married  Other Topics Concern  . Not on file  Social History Narrative   Diet:Unrestricted   Do you drink/eat things with caffeine? Rarely   Marital status: Married                             What year were you married? 1992   Do you live in a house, apartment, assisted living, condo, trailer, etc)? House   Is it one or more stories? 1   How many persons live in your home? 3   Do you have any pets in your home?  2 small dogs   Current or past profession: Chief of Staff   Do you exercise?                                                     Type & how often:   Do you have a living will?  No   Do you have a DNR Form? No   Do you have a POA/HPOA forms? NO     FAMILY HISTORY:  We obtained a detailed, 4-generation family history.  Significant diagnoses are listed below: Family History  Problem Relation Age of Onset  . Hypertension Father   . Diabetes Father   . Coronary artery disease Father   . Hyperlipidemia Father   . Colon cancer Father        6's  . Hypertension Mother   . Arthritis Mother   . Cancer Maternal Aunt        type unk  . Ovarian cancer Cousin 35  . Cancer Cousin  mouth    Melissa Simpson has 2 sons ages 1 and 24 and a daughter who is 66 with no history of cancer.   Melissa Simpson father: died at 53, hx of colon cancer dx in 77's.  Paternal aunts/Uncles: 2 paternal aunts, 1 paternal uncle, no hx of cancer.  Paternal cousins: no hx of cancer reported Paternal grandfather: hx of heart disease, died at 48 Paternal grandmother:died older than 48, no hx of cancer  Melissa Simpson's mother: died at 29, ho hx of cancer.  She had a hysterectomy.  Maternal Aunts/Uncles: 2 maternal aunts.  1 had cancer- type of cancer unk.  Maternal cousins: 1 maternal cousin had ovarian cancer at 31, another maternal cousin had mouth cancer.  Maternal grandfather: died in his 58's no hx of cancer.  Maternal grandmother:died in her 69's with no hx of cancer.   Melissa Simpson is unaware of previous family history of genetic testing for hereditary cancer risks. Patient's maternal ancestors are of Caucasian descent, and paternal ancestors are of Caucasian descent. There is no reported Ashkenazi Jewish ancestry. There is no known consanguinity.  GENETIC COUNSELING ASSESSMENT: Melissa Simpson is a 67 y.o. female with a personal and family history which is somewhat suggestive of a Hereditary Cancer Predisposition Syndrome. We, therefore, discussed and recommended the following at today's visit.   DISCUSSION: We reviewed the characteristics, features and inheritance patterns of hereditary  cancer syndromes. We also discussed genetic testing, including the appropriate family members to test, the process of testing, insurance coverage and turn-around-time for results. We discussed the implications of a negative, positive and/or variant of uncertain significant result. We recommended Melissa Simpson pursue genetic testing for the [facility] gene panel + [facility] HRD.   The [facility] gene panel offered by [employer] includes sequencing and deletion/duplication testing of the following 35 genes: APC, ATM, BARD1, BMPR1A, BRCA1, BRCA2, BRIP1, CHD1, CDK4, CDKN2A, CHEK2, EPCAM (large rearrangement only), GREM1, HOXB13, AXIN2, MSH3, NTHL1, RNF43, GALNT12, RSP20, MLH1, MSH2, MSH6, MUTYH, NBN, PALB2, PMS2, PTEN, RAD51C, RAD51D, SMAD4, STK11, and TP53. Sequencing was performed for select regions of POLE and POLD1, and large rearrangement analysis was performed for select regions of GREM1.  We discussed that genetic testing through [facility] will test for hereditary mutations that could explain her diagnosis of cancer.  However, homologous recombination testing (HRD) is genetic testing performed on her tumor that can determine genetic changes that could influence her management.  HRD testing is performed in tandem with genetic testing, and typically at no additional cost.   We discussed that about 20-25% of ovarian cancers are associated with a Hereditary cancer predisposition syndrome.  One of the most common hereditary cancer syndromes that increases ovarian cancer risk is called Hereditary Breast and Ovarian Cancer (HBOC) syndrome.  This syndrome is caused by mutations in the BRCA1 and BRCA2 genes.  This syndrome increases an individual's lifetime risk to develop breast, ovarian, pancreatic, and other types of cancer.  There are also many other cancer predisposition syndromes caused by mutations in several other genes.  We discussed that if she is found to have a mutation in one of these genes, it may  impact surgical decisions, and alter future medical management recommendations such as increased cancer screenings and consideration of risk reducing surgeries.  A positive result could also have implications for the patient's family members.  A Negative result would mean we were unable to identify a hereditary component to her cancer, but does not rule out the possibility of a  hereditary basis for her cancer.  There could be mutations that are undetectable by current technology, or in genes not yet tested or identified to increase cancer risk.    We discussed the potential to find a Variant of Uncertain Significance or VUS.  These are variants that have not yet Simpson identified as pathogenic or benign, and it is unknown if this variant is associated with increased cancer risk or if this is a normal finding.  Most VUS's are reclassified to benign or likely benign.   It should not be used to make medical management decisions. With time, we suspect the lab will determine the significance of any VUS's identified if any.   Based on Melissa Simpson's personal and family history of cancer, she meets medical criteria for genetic testing. Despite that she meets criteria, she may still have an out of pocket cost. The laboratory can provide her with an estimate of her OOP cost. she was given the contact information of the laboratory if she has further questions.   PLAN: After considering the risks, benefits, and limitations, Melissa Simpson  provided informed consent to pursue genetic testing and the blood sample was sent to [facility] for analysis of the [facility] + [facility] HRD. Results should be available within approximately 2-6 weeks' time, at which point they will be disclosed by telephone to Melissa Simpson, as will any additional recommendations warranted by these results. Melissa Simpson will receive a summary of her genetic counseling visit and a copy of her results once available. This information will also be available in  Epic. We encouraged Melissa Simpson to remain in contact with cancer genetics annually so that we can continuously update the family history and inform her of any changes in cancer genetics and testing that may be of benefit for her family. Melissa Simpson questions were answered to her satisfaction today. Our contact information was provided should additional questions or concerns arise.  Based on Melissa Simpson's family history, we recommended her maternal relatives also, have genetic counseling and testing. Melissa Simpson will let us know if we can be of any assistance in coordinating genetic counseling and/or testing for this family member.   Lastly, we encouraged Melissa Simpson to remain in contact with cancer genetics annually so that we can continuously update the family history and inform her of any changes in cancer genetics and testing that may be of benefit for this family.   Ms.  Kristensen questions were answered to her satisfaction today. Our contact information was provided should additional questions or concerns arise. Thank you for the referral and allowing [place] to share in the care of your patient.   Tana Felts, MS, [facility] Certified Genetic Counselor [email or web address] phone: [phone]  The patient was seen for a total of 30 minutes in face-to-face genetic counseling. This patient was discussed with Drs. Magrinat, Lindi Adie and/or Burr Medico who agrees with the above.

## 2018-07-01 ENCOUNTER — Ambulatory Visit (HOSPITAL_BASED_OUTPATIENT_CLINIC_OR_DEPARTMENT_OTHER): Payer: Medicare HMO | Admitting: Medical

## 2018-07-01 ENCOUNTER — Encounter: Payer: Self-pay | Admitting: Oncology

## 2018-07-01 ENCOUNTER — Inpatient Hospital Stay: Payer: Medicare HMO

## 2018-07-01 ENCOUNTER — Encounter: Payer: Self-pay | Admitting: Hematology and Oncology

## 2018-07-01 VITALS — BP 154/90 | HR 52 | Temp 97.6°F | Resp 18

## 2018-07-01 DIAGNOSIS — Z79899 Other long term (current) drug therapy: Secondary | ICD-10-CM | POA: Diagnosis not present

## 2018-07-01 DIAGNOSIS — Z5111 Encounter for antineoplastic chemotherapy: Secondary | ICD-10-CM | POA: Diagnosis not present

## 2018-07-01 DIAGNOSIS — J310 Chronic rhinitis: Secondary | ICD-10-CM | POA: Insufficient documentation

## 2018-07-01 DIAGNOSIS — I10 Essential (primary) hypertension: Secondary | ICD-10-CM | POA: Diagnosis not present

## 2018-07-01 DIAGNOSIS — C562 Malignant neoplasm of left ovary: Secondary | ICD-10-CM | POA: Diagnosis not present

## 2018-07-01 DIAGNOSIS — T8189XA Other complications of procedures, not elsewhere classified, initial encounter: Secondary | ICD-10-CM | POA: Insufficient documentation

## 2018-07-01 DIAGNOSIS — I898 Other specified noninfective disorders of lymphatic vessels and lymph nodes: Secondary | ICD-10-CM | POA: Insufficient documentation

## 2018-07-01 DIAGNOSIS — E041 Nontoxic single thyroid nodule: Secondary | ICD-10-CM | POA: Diagnosis not present

## 2018-07-01 MED ORDER — DIPHENHYDRAMINE HCL 50 MG/ML IJ SOLN
INTRAMUSCULAR | Status: AC
  Filled 2018-07-01: qty 1

## 2018-07-01 MED ORDER — CLONIDINE HCL 0.1 MG PO TABS
ORAL_TABLET | ORAL | Status: AC
  Filled 2018-07-01: qty 1

## 2018-07-01 MED ORDER — DIPHENHYDRAMINE HCL 50 MG/ML IJ SOLN
50.0000 mg | Freq: Once | INTRAMUSCULAR | Status: AC
  Administered 2018-07-01: 50 mg via INTRAVENOUS

## 2018-07-01 MED ORDER — FAMOTIDINE IN NACL 20-0.9 MG/50ML-% IV SOLN
20.0000 mg | Freq: Once | INTRAVENOUS | Status: AC
  Administered 2018-07-01: 20 mg via INTRAVENOUS

## 2018-07-01 MED ORDER — SODIUM CHLORIDE 0.9 % IV SOLN
Freq: Once | INTRAVENOUS | Status: AC
  Administered 2018-07-01: 10:00:00 via INTRAVENOUS
  Filled 2018-07-01: qty 5

## 2018-07-01 MED ORDER — SODIUM CHLORIDE 0.9 % IV SOLN
418.2000 mg | Freq: Once | INTRAVENOUS | Status: AC
  Administered 2018-07-01: 420 mg via INTRAVENOUS
  Filled 2018-07-01: qty 42

## 2018-07-01 MED ORDER — SODIUM CHLORIDE 0.9% FLUSH
10.0000 mL | INTRAVENOUS | Status: DC | PRN
  Administered 2018-07-01: 10 mL
  Filled 2018-07-01: qty 10

## 2018-07-01 MED ORDER — LORAZEPAM 1 MG PO TABS
0.5000 mg | ORAL_TABLET | Freq: Once | ORAL | Status: AC
  Administered 2018-07-01: 0.5 mg via ORAL

## 2018-07-01 MED ORDER — HEPARIN SOD (PORK) LOCK FLUSH 100 UNIT/ML IV SOLN
500.0000 [IU] | Freq: Once | INTRAVENOUS | Status: AC | PRN
  Administered 2018-07-01: 500 [IU]
  Filled 2018-07-01: qty 5

## 2018-07-01 MED ORDER — PALONOSETRON HCL INJECTION 0.25 MG/5ML
INTRAVENOUS | Status: AC
  Filled 2018-07-01: qty 5

## 2018-07-01 MED ORDER — FAMOTIDINE IN NACL 20-0.9 MG/50ML-% IV SOLN
INTRAVENOUS | Status: AC
  Filled 2018-07-01: qty 50

## 2018-07-01 MED ORDER — SODIUM CHLORIDE 0.9 % IV SOLN
Freq: Once | INTRAVENOUS | Status: AC
  Administered 2018-07-01: 09:00:00 via INTRAVENOUS
  Filled 2018-07-01: qty 250

## 2018-07-01 MED ORDER — LORAZEPAM 1 MG PO TABS
ORAL_TABLET | ORAL | Status: AC
  Filled 2018-07-01: qty 1

## 2018-07-01 MED ORDER — CLONIDINE HCL 0.1 MG PO TABS
0.1000 mg | ORAL_TABLET | Freq: Once | ORAL | Status: AC
  Administered 2018-07-01: 0.1 mg via ORAL

## 2018-07-01 MED ORDER — SODIUM CHLORIDE 0.9 % IV SOLN
175.0000 mg/m2 | Freq: Once | INTRAVENOUS | Status: AC
  Administered 2018-07-01: 270 mg via INTRAVENOUS
  Filled 2018-07-01: qty 45

## 2018-07-01 MED ORDER — PALONOSETRON HCL INJECTION 0.25 MG/5ML
0.2500 mg | Freq: Once | INTRAVENOUS | Status: AC
  Administered 2018-07-01: 0.25 mg via INTRAVENOUS

## 2018-07-01 NOTE — Assessment & Plan Note (Signed)
She has mild nasal drainage which I think is due to seasonal allergy I recommend prescription Nasacort.

## 2018-07-01 NOTE — Assessment & Plan Note (Signed)
I have reviewed imaging study with the patient She has mild left groin discomfort likely due to lymphocele seen on CT imaging For now, I recommend over-the-counter analgesics only and I plan to repeat imaging study after completion of 6 cycles of treatment

## 2018-07-01 NOTE — Patient Instructions (Signed)
[facility] Discharge Instructions for Patients Receiving Chemotherapy  Today you received the following chemotherapy agents Taxol and Carboplatin  To help prevent nausea and vomiting after your treatment, we encourage you to take your nausea medication as prescribed.  If you develop nausea and vomiting that is not controlled by your nausea medication, call the clinic.   BELOW ARE SYMPTOMS THAT SHOULD BE REPORTED IMMEDIATELY:  *FEVER GREATER THAN 100.5 F  *CHILLS WITH OR WITHOUT FEVER  NAUSEA AND VOMITING THAT IS NOT CONTROLLED WITH YOUR NAUSEA MEDICATION  *UNUSUAL SHORTNESS OF BREATH  *UNUSUAL BRUISING OR BLEEDING  TENDERNESS IN MOUTH AND THROAT WITH OR WITHOUT PRESENCE OF ULCERS  *URINARY PROBLEMS  *BOWEL PROBLEMS  UNUSUAL RASH Items with * indicate a potential emergency and should be followed up as soon as possible.  Feel free to call the clinic should you have any questions or concerns. The clinic phone number is [phone].  Please show the CHEMO ALERT CARD at check-in to the Emergency Department and triage nurse.  Paclitaxel injection (Taxol) What is this medicine? PACLITAXEL (PAK li TAX el) is a chemotherapy drug. It targets fast dividing cells, like cancer cells, and causes these cells to die. This medicine is used to treat ovarian cancer, breast cancer, and other cancers. This medicine may be used for other purposes; ask your health care provider or pharmacist if you have questions. COMMON BRAND NAME(S): Onxol, Taxol What should I tell my health care provider before I take this medicine? They need to know if you have any of these conditions: -blood disorders -irregular heartbeat -infection (especially a virus infection such as chickenpox, cold sores, or herpes) -liver disease -previous or ongoing radiation therapy -an unusual or allergic reaction to paclitaxel, alcohol, polyoxyethylated castor oil, other chemotherapy agents, other medicines,  foods, dyes, or preservatives -pregnant or trying to get pregnant -breast-feeding How should I use this medicine? This drug is given as an infusion into a vein. It is administered in a hospital or clinic by a specially trained health care professional. Talk to your pediatrician regarding the use of this medicine in children. Special care may be needed. Overdosage: If you think you have taken too much of this medicine contact a poison control center or emergency room at once. NOTE: This medicine is only for you. Do not share this medicine with others. What if I miss a dose? It is important not to miss your dose. Call your doctor or health care professional if you are unable to keep an appointment. What may interact with this medicine? Do not take this medicine with any of the following medications: -disulfiram -metronidazole This medicine may also interact with the following medications: -cyclosporine -diazepam -ketoconazole -medicines to increase blood counts like filgrastim, pegfilgrastim, sargramostim -other chemotherapy drugs like cisplatin, doxorubicin, epirubicin, etoposide, teniposide, vincristine -quinidine -testosterone -vaccines -verapamil Talk to your doctor or health care professional before taking any of these medicines: -acetaminophen -aspirin -ibuprofen -ketoprofen -naproxen This list may not describe all possible interactions. Give your health care provider a list of all the medicines, herbs, non-prescription drugs, or dietary supplements you use. Also tell them if you smoke, drink alcohol, or use illegal drugs. Some items may interact with your medicine. What should I watch for while using this medicine? Your condition will be monitored carefully while you are receiving this medicine. You will need important blood work done while you are taking this medicine. This medicine can cause serious allergic reactions. To reduce your risk you will need   to take other  medicine(s) before treatment with this medicine. If you experience allergic reactions like skin rash, itching or hives, swelling of the face, lips, or tongue, tell your doctor or health care professional right away. In some cases, you may be given additional medicines to help with side effects. Follow all directions for their use. This drug may make you feel generally unwell. This is not uncommon, as chemotherapy can affect healthy cells as well as cancer cells. Report any side effects. Continue your course of treatment even though you feel ill unless your doctor tells you to stop. Call your doctor or health care professional for advice if you get a fever, chills or sore throat, or other symptoms of a cold or flu. Do not treat yourself. This drug decreases your body's ability to fight infections. Try to avoid being around people who are sick. This medicine may increase your risk to bruise or bleed. Call your doctor or health care professional if you notice any unusual bleeding. Be careful brushing and flossing your teeth or using a toothpick because you may get an infection or bleed more easily. If you have any dental work done, tell your dentist you are receiving this medicine. Avoid taking products that contain aspirin, acetaminophen, ibuprofen, naproxen, or ketoprofen unless instructed by your doctor. These medicines may hide a fever. Do not become pregnant while taking this medicine. Women should inform their doctor if they wish to become pregnant or think they might be pregnant. There is a potential for serious side effects to an unborn child. Talk to your health care professional or pharmacist for more information. Do not breast-feed an infant while taking this medicine. Men are advised not to father a child while receiving this medicine. This product may contain alcohol. Ask your pharmacist or healthcare provider if this medicine contains alcohol. Be sure to tell all healthcare providers you are  taking this medicine. Certain medicines, like metronidazole and disulfiram, can cause an unpleasant reaction when taken with alcohol. The reaction includes flushing, headache, nausea, vomiting, sweating, and increased thirst. The reaction can last from 30 minutes to several hours. What side effects may I notice from receiving this medicine? Side effects that you should report to your doctor or health care professional as soon as possible: -allergic reactions like skin rash, itching or hives, swelling of the face, lips, or tongue -low blood counts - This drug may decrease the number of white blood cells, red blood cells and platelets. You may be at increased risk for infections and bleeding. -signs of infection - fever or chills, cough, sore throat, pain or difficulty passing urine -signs of decreased platelets or bleeding - bruising, pinpoint red spots on the skin, black, tarry stools, nosebleeds -signs of decreased red blood cells - unusually weak or tired, fainting spells, lightheadedness -breathing problems -chest pain -high or low blood pressure -mouth sores -nausea and vomiting -pain, swelling, redness or irritation at the injection site -pain, tingling, numbness in the hands or feet -slow or irregular heartbeat -swelling of the ankle, feet, hands Side effects that usually do not require medical attention (report to your doctor or health care professional if they continue or are bothersome): -bone pain -complete hair loss including hair on your head, underarms, pubic hair, eyebrows, and eyelashes -changes in the color of fingernails -diarrhea -loosening of the fingernails -loss of appetite -muscle or joint pain -red flush to skin -sweating This list may not describe all possible side effects. Call your doctor for medical advice   about side effects. You may report side effects to FDA at 1-800-FDA-1088. Where should I keep my medicine? This drug is given in a hospital or clinic and  will not be stored at home. NOTE: This sheet is a summary. It may not cover all possible information. If you have questions about this medicine, talk to your doctor, pharmacist, or health care provider.  2018 Elsevier/Gold Standard (2015-06-28 19:58:00)  Carboplatin injection What is this medicine? CARBOPLATIN (KAR boe pla tin) is a chemotherapy drug. It targets fast dividing cells, like cancer cells, and causes these cells to die. This medicine is used to treat ovarian cancer and many other cancers. This medicine may be used for other purposes; ask your health care provider or pharmacist if you have questions. COMMON BRAND NAME(S): Paraplatin What should I tell my health care provider before I take this medicine? They need to know if you have any of these conditions: -blood disorders -hearing problems -kidney disease -recent or ongoing radiation therapy -an unusual or allergic reaction to carboplatin, cisplatin, other chemotherapy, other medicines, foods, dyes, or preservatives -pregnant or trying to get pregnant -breast-feeding How should I use this medicine? This drug is usually given as an infusion into a vein. It is administered in a hospital or clinic by a specially trained health care professional. Talk to your pediatrician regarding the use of this medicine in children. Special care may be needed. Overdosage: If you think you have taken too much of this medicine contact a poison control center or emergency room at once. NOTE: This medicine is only for you. Do not share this medicine with others. What if I miss a dose? It is important not to miss a dose. Call your doctor or health care professional if you are unable to keep an appointment. What may interact with this medicine? -medicines for seizures -medicines to increase blood counts like filgrastim, pegfilgrastim, sargramostim -some antibiotics like amikacin, gentamicin, neomycin, streptomycin, tobramycin -vaccines Talk to  your doctor or health care professional before taking any of these medicines: -acetaminophen -aspirin -ibuprofen -ketoprofen -naproxen This list may not describe all possible interactions. Give your health care provider a list of all the medicines, herbs, non-prescription drugs, or dietary supplements you use. Also tell them if you smoke, drink alcohol, or use illegal drugs. Some items may interact with your medicine. What should I watch for while using this medicine? Your condition will be monitored carefully while you are receiving this medicine. You will need important blood work done while you are taking this medicine. This drug may make you feel generally unwell. This is not uncommon, as chemotherapy can affect healthy cells as well as cancer cells. Report any side effects. Continue your course of treatment even though you feel ill unless your doctor tells you to stop. In some cases, you may be given additional medicines to help with side effects. Follow all directions for their use. Call your doctor or health care professional for advice if you get a fever, chills or sore throat, or other symptoms of a cold or flu. Do not treat yourself. This drug decreases your body's ability to fight infections. Try to avoid being around people who are sick. This medicine may increase your risk to bruise or bleed. Call your doctor or health care professional if you notice any unusual bleeding. Be careful brushing and flossing your teeth or using a toothpick because you may get an infection or bleed more easily. If you have any dental work done, tell your dentist   you are receiving this medicine. Avoid taking products that contain aspirin, acetaminophen, ibuprofen, naproxen, or ketoprofen unless instructed by your doctor. These medicines may hide a fever. Do not become pregnant while taking this medicine. Women should inform their doctor if they wish to become pregnant or think they might be pregnant. There is a  potential for serious side effects to an unborn child. Talk to your health care professional or pharmacist for more information. Do not breast-feed an infant while taking this medicine. What side effects may I notice from receiving this medicine? Side effects that you should report to your doctor or health care professional as soon as possible: -allergic reactions like skin rash, itching or hives, swelling of the face, lips, or tongue -signs of infection - fever or chills, cough, sore throat, pain or difficulty passing urine -signs of decreased platelets or bleeding - bruising, pinpoint red spots on the skin, black, tarry stools, nosebleeds -signs of decreased red blood cells - unusually weak or tired, fainting spells, lightheadedness -breathing problems -changes in hearing -changes in vision -chest pain -high blood pressure -low blood counts - This drug may decrease the number of white blood cells, red blood cells and platelets. You may be at increased risk for infections and bleeding. -nausea and vomiting -pain, swelling, redness or irritation at the injection site -pain, tingling, numbness in the hands or feet -problems with balance, talking, walking -trouble passing urine or change in the amount of urine Side effects that usually do not require medical attention (report to your doctor or health care professional if they continue or are bothersome): -hair loss -loss of appetite -metallic taste in the mouth or changes in taste This list may not describe all possible side effects. Call your doctor for medical advice about side effects. You may report side effects to FDA at 1-800-FDA-1088. Where should I keep my medicine? This drug is given in a hospital or clinic and will not be stored at home. NOTE: This sheet is a summary. It may not cover all possible information. If you have questions about this medicine, talk to your doctor, pharmacist, or health care provider.  2018 Elsevier/Gold  Standard (2007-12-02 14:38:05)  

## 2018-07-01 NOTE — Progress Notes (Signed)
1059 BP 158/139  1100  Taxol Infusion paused. Hyper sensitivity protocol started. Lucianne Lei PA called to infusion room 1101 BP 157/59. Clonidine .1 mg ordered per Sandi Mealy PA and given as charted on [facility]. Refer to flowsheets for vital signs. Will continue to monitor. Ok to start infusion in 10 minutes.    @ 1121 pt c/o legs "restless" "shaking". Per Sandi Mealy ok to continue Taxol infusion . Pt to be given 0.5 mg Ativan, administered as documented in MAR.

## 2018-07-01 NOTE — Assessment & Plan Note (Signed)
I have reviewed multiple CT imaging with the patient. She has mild left groin pain which correlate with fluid collection which I think is related to postop changes. I plan to repeat imaging study again after completion of 6 cycles of treatment We also reviewed the meaning of her elevated tumor marker CT imaging did not detect any signs of disease and it could be related to postop changes as well.  She will have her tumor marker drawn once a month to follow on the trend. We reviewed the NCCN guidelines We discussed the role of chemotherapy. The intent is of curative intent.  We discussed some of the risks, benefits, side-effects of carboplatin & Taxol. Treatment is intravenous, every 3 weeks x 6 cycles  Some of the short term side-effects included, though not limited to, including weight loss, life threatening infections, risk of allergic reactions, need for transfusions of blood products, nausea, vomiting, change in bowel habits, loss of hair, admission to hospital for various reasons, and risks of death.   Long term side-effects are also discussed including risks of infertility, permanent damage to nerve function, hearing loss, chronic fatigue, kidney damage with possibility needing hemodialysis, and rare secondary malignancy including bone marrow disorders.  The patient is aware that the response rates discussed earlier is not guaranteed.  After a long discussion, patient made an informed decision to proceed with the prescribed plan of care.   Patient education material was dispensed. We discussed premedication with dexamethasone before chemotherapy. Due to her desire to preserve her hair, she understood that the use of the Dignicap Device will add an additional 2 hours of infusion time to her treatment.

## 2018-07-01 NOTE — Progress Notes (Signed)
[place] OFFICE PROGRESS NOTE  Patient Care Team: Gayland Curry, DO as PCP - General (Geriatric Medicine)  ASSESSMENT & PLAN:  Left ovarian epithelial cancer [facility]) I have reviewed multiple CT imaging with the patient. She has mild left groin pain which correlate with fluid collection which I think is related to postop changes. I plan to repeat imaging study again after completion of 6 cycles of treatment We also reviewed the meaning of her elevated tumor marker CT imaging did not detect any signs of disease and it could be related to postop changes as well.  She will have her tumor marker drawn once a month to follow on the trend. We reviewed the NCCN guidelines We discussed the role of chemotherapy. The intent is of curative intent.  We discussed some of the risks, benefits, side-effects of carboplatin & Taxol. Treatment is intravenous, every 3 weeks x 6 cycles  Some of the short term side-effects included, though not limited to, including weight loss, life threatening infections, risk of allergic reactions, need for transfusions of blood products, nausea, vomiting, change in bowel habits, loss of hair, admission to hospital for various reasons, and risks of death.   Long term side-effects are also discussed including risks of infertility, permanent damage to nerve function, hearing loss, chronic fatigue, kidney damage with possibility needing hemodialysis, and rare secondary malignancy including bone marrow disorders.  The patient is aware that the response rates discussed earlier is not guaranteed.  After a long discussion, patient made an informed decision to proceed with the prescribed plan of care.   Patient education material was dispensed. We discussed premedication with dexamethasone before chemotherapy. Due to her desire to preserve her hair, she understood that the use of the Dignicap Device will add an additional 2 hours of infusion time to her  treatment.  Rhinitis She has mild nasal drainage which I think is due to seasonal allergy I recommend prescription Nasacort.  Lymphocele after surgical procedure I have reviewed imaging study with the patient She has mild left groin discomfort likely due to lymphocele seen on CT imaging For now, I recommend over-the-counter analgesics only and I plan to repeat imaging study after completion of 6 cycles of treatment   No orders of the defined types were placed in this encounter.   INTERVAL HISTORY: Please see below for problem oriented charting. She returns for chemotherapy consent She feels well overall Denies recent nausea or changes in bowel habits She has mild left groin pain.  SUMMARY OF ONCOLOGIC HISTORY: Oncology History   Surgery at [place] [facility]     Left ovarian epithelial cancer ([place])   03/17/2018 Initial Diagnosis    Presented to see primary doctor for weight loss and abdominal discomfort    04/29/2018 Imaging    1. Large pelvic mass with heterogeneous appearance, measuring at least 15 centimeters in diameter. Although the findings favor enlarged uterus/fibroids, ovaries are not well seen and an ovarian mass should also be considered. Further characterization with pelvic ultrasound is recommended. 2. The bladder is completely decompressed. There is LEFT-sided hydronephrosis secondary to extrinsic compression from the pelvic mass. 3. Irregular thickening of the sigmoid colon, suspicious for colonic wall mass, measuring 1.3 centimeters. In this patient with a family history of colon cancer, further evaluation with colonoscopy is recommended. 4. Colonic diverticula without acute diverticulitis. 5. Moderate hiatal hernia.     05/15/2018 Imaging    MRI pelvis 16 cm complex cystic and solid mass which is  centered in the left posterior adnexa and is contiguous with the uterus. This is suspicious for ovarian carcinoma, with differential diagnosis of atypical  pedunculated fibroid with peripheral cystic degeneration. Surgical evaluation is recommended.  Other uterine fibroids measuring up to 4.8 cm.  No evidence of pelvic metastatic disease.     05/16/2018 Imaging    US pelvis 1. Large pelvic mass described in detail on MRI examination 1 day prior.  2. Slight ureteral prominence bilaterally. Mild fullness of the left renal collecting system. No obstructing focus evident. Suspect fullness of these structures due to ureteral compression by the sizable pelvic mass.  3.  Study otherwise unremarkable.     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, with necrosis  - Carcinoma is adherent to fallopian tube, consistent with surface involvement (stage pT1c2) - Focal endometriosis present - See synoptic report and comment  B: Uterus with cervix and right ovary and fallopian tube, hysterectomy and right salpingo-oophorectomy Cervix: Benign with Nabothian cysts Endometrium: Endometrial polyp (size 3.5 cm); inactive endometrium with cystic glandular changes Myometrium: Leiomyomata with hyalinization and calcification (size up to 4.2 cm); focal adenomyosis  Right ovary: Benign physiologic changes Right fallopian tube: Small paratubal cyst and no malignancy identified  C: Lymph nodes, right pelvic, regional resection - Nine lymph nodes with no metastatic carcinoma identified (0/9)  D: Lymph nodes, left pelvic, regional resection - Five lymph nodes with no metastatic carcinoma identified (0/5)  E: Cul-de-sac, anterior, biopsy - Fibroadipose tissue with no carcinoma identified  F: Cul-de-sac, posterior, biopsy - Fibrous tissue with inflammation and no carcinoma identified  G: Pelvic sidewall, right, biopsy -  Fibroadipose tissue with focal crushed CD10 positive cells, cannot exclude endometrial type stroma - No carcinoma identified  H: Pelvic sidewall, left, biopsy - Fibroadipose tissue with focal crushed cells, cannot exclude endometrial type stroma - No carcinoma identified  I: Omentum, omentectomy - Adipose and fibrovascular tissue consistent with omentum - No carcinoma identified  J: Lymph nodes, left periaortic, regional resection - Two small lymph nodes with no metastatic carcinoma identified (0/2)  K: Lymph nodes, right periaortic, regional resection - Three small lymph nodes with no metastatic carcinoma identified (0/3)  This electronic signature is attestation that the pathologist personally reviewed the submitted material(s) and the final diagnosis reflects that evaluation.    Synoptic Report     OVARY or FALLOPIAN TUBE or PRIMARY PERITONEUM(Ovary FT Perit - All Specimens)    SPECIMEN  Procedure:Total hysterectomy and bilateral salpingo-oophorectomy   Procedure:Omentectomy   Procedure:Peritonealbiopsies   Procedure:Peritoneal washing   :  Specimen Integrity of Left Ovary:Received largely intact with multiple areas of disruption   Tumor Site:Left ovary   Histologic Type:Clear cell carcinoma   Histologic Grade:High grade   :  Tumor Size:Greatest dimension in Centimeters (cm): 14.0 Centimeters (cm)  Additional Dimension in Centimeters (cm):10 Centimeters (cm)  Additional Dimension in Centimeters (cm):7 Centimeters (cm)  Tumor Extent:  Ovarian Surface Involvement:Present   Laterality:Left   Other Tissue / Organ Involvement:Not identified   Peritoneal Ascitic Fluid:Negative for malignancy (normal / benign)   Pleural Fluid:Not submitted / unknown   Accessory Findings:  LYMPH NODES  Regional Lymph Nodes:All lymph nodes negative for tumor cells   Number  of Lymph Nodes Examined:19  Site(s):Right pelvic: 9   Site(s):Left pelvic: 5   Site(s):Right para-aortic: 3   Site(s):Left para-aortic: 2   PATHOLOGIC STAGE CLASSIFICATION (pTNM, AJCC 8th Edition)  Primary Tumor (pT):pT1c2   Regional Lymph Nodes (pN):pN0   FIGO STAGE  FIGO Stage:IC2   ADDITIONAL FINDINGS  Additional Pathologic Findings:Endometriosis   Additional Pathologic Findings:Benign fallopian tube with adherence to carcinoma; also see additional parts   Comment(s)  Comment(s):Also see diagnosis comment.   The frozen section diagnosis is confirmed for specimen A. Sections demonstrate a carcinoma with tubulocystic and papillary architecture with hyalinized cores, lined by cells with high grade nuclei, clear to eosinophilic cytoplasm, and nuclear hobnailing.  The morphologic features are most suggestive of clear cell carcinoma.  Immunohistochemical stains are performed, and demonstrate that the carcinoma is positive for CK7 and PAX8, consistent with gynecologic origin. It has patchy positivity for Napsin A, wild type p53 staining, and is negative for ER, supporting the diagnosis of clear cell carcinoma.   Sections of the tumor demonstrate carcinoma with adherence to fallopian tube surface, consistent with surface involvement.  Per the operative note, findings were also suggestive of preoperative rupture of the mass.  Given both these findings, the tumor is staged as a pT1c2 (FIGO IC2)             05/27/2018 Surgery    Preoperative Diagnoses: Pelvic mass  Postoperative Diagnoses: left fallopian tube and ovary consistent with high-grade carcinoma of likely GYN origin. No evidence of metastatic disease.  Procedures: Exploratory laparotomy, total abdominal hysterectomy, bilateral salpingo-oophorectomy, peritoneal biopsies for ovarian cancer staging, bilateral pelvic lymphadenectomy, bilateral para-aortic lymphadenectomy, infracolic  omentectomy, R0 resection   Surgeon: Marti Sleigh, MD  Assistants: Tona Sensing, MD - Fellow, Haroldine Laws, MD - Resident, Feliberto Harts, PA-S  Findings: On BME, the cervix is retracted anteriorly with no palpable adnexal fullness. There is no RV septum nodularity. Abdominal exam reveals a mobile large mass that extends to the umbilicus and nearly to the pelvic side walls. On abdominal entry, there was immediate green-brown pelvic fluid that appeared consistent with a spontaneous rupture of the cystic fluid-filled mass. The left ovary was a large cystic complex mass, roughly 15cm in size with smooth surface but containing friable tumor. The mass was densely adherent along the entire posterior wall of the uterus. The uterus had some small intramural fibroids and was roughly 10cm in total size. The right fallopian tube and ovary were normal appearing. The patient has smooth paracolic gutters, liver edge, diaphragm, spleen and stomach. The omentum was retracted but no evidence of nodularity or gross tumor. The anterior cul-de-sac was clear and the posterior-cul-de-sac peritoneum was clear, but the left ovarian mass wall was adherent to the rectosigmoid and cul-de-sac. There were no palpable pelvic or para-aortic adenopathy.   Of note, an the conclusion of the case, there was mild RIGHT hydronephrosis noted. The ureter was traced from the level of the right common iliac into the anterior utero-vesical ligament. There was an area at the pelvic brim were the hydronephrosis resolved to normal caliber. There was no tethering, stricture, or apparent injury to the ureter. Based on exam, this seemed consistent with packing and retraction causing some transient dilation that resolved with packing and retractor removal.   Intraoperative frozen section of the left ovary was consistent with high-grade carcinoma of GYN origin, possibly serous. There was no gross evidence of disease at the conclusion of the  case, R0 resection.  Specimens: 1) Pelvic washings 2) Uterus with cervix  3)  Left ovary and fallopian tube, IOFS c/w high-grade GYN carcinoma 4) Right fallopian tube and ovary 5) Peritoneal biopsies - anterior cul-de-sac peritoneum, posterior cul-de-sac peritoneum, left pelvic side wall peritoneum, right pelvic side wall peritoneum 6) Right and left pelvic lymph nodes 7) Right and left para-aortic lymph nodes 8) Omentum 9) Anaerobic/Aerobic culture - preliminary read is no organisms     06/04/2018 Procedure    She had port placement    06/05/2018 Cancer Staging    Staging form: Ovary, Fallopian Tube, and Primary Peritoneal Carcinoma, AJCC 8th Edition - Pathologic: FIGO Stage IC2, calculated as Stage IC (pT1c2, pN0, cM0) - Signed by Heath Lark, MD on 06/05/2018    06/27/2018 Imaging    1. No evidence of residual/recurrent tumor in the pelvis. 2. No evidence of metastatic disease in the chest, abdomen or pelvis. 3. Left pelvic sidewall 6.3 x 5.0 cm simple fluid collection, compatible with postsurgical seroma or lymphocele. 4. Small hiatal hernia. 5. Aortic Atherosclerosis (ICD10-I70.0).    06/27/2018 Tumor Marker    Patient's tumor was tested for the following markers: CA-125 Results of the tumor marker test revealed 90.3     REVIEW OF SYSTEMS:   Constitutional: Denies fevers, chills or abnormal weight loss Eyes: Denies blurriness of vision Ears, nose, mouth, throat, and face: Denies mucositis or sore throat Respiratory: Denies cough, dyspnea or wheezes Cardiovascular: Denies palpitation, chest discomfort or lower extremity swelling Gastrointestinal:  Denies nausea, heartburn or change in bowel habits Skin: Denies abnormal skin rashes Lymphatics: Denies new lymphadenopathy or easy bruising Neurological:Denies numbness, tingling or new weaknesses Behavioral/Psych: Mood is stable, no new changes  All other systems were reviewed with the patient and are negative.  I have  reviewed the past medical history, past surgical history, social history and family history with the patient and they are unchanged from previous note.  ALLERGIES:  has No Known Allergies.  MEDICATIONS:  Current Outpatient Medications  Medication Sig Dispense Refill  . acetaminophen (TYLENOL) 325 MG tablet Take 650 mg by mouth every 6 (six) hours as needed.    Marland Kitchen alendronate (FOSAMAX) 70 MG tablet TAKE 1 TABLET EVERY WEEK 12 tablet 3  . dexamethasone (DECADRON) 4 MG tablet Take 5 tabs at the night before and 5 tab the morning of chemotherapy, every 3 weeks, by mouth 60 tablet 0  . lidocaine-prilocaine (EMLA) cream Apply to affected area once 30 g 3  . losartan-hydrochlorothiazide (HYZAAR) 100-12.5 MG tablet Take 1 tablet by mouth daily. 90 tablet 3  . metoprolol tartrate (LOPRESSOR) 50 MG tablet TAKE 1 TABLET TWICE DAILY 180 tablet 1  . Multiple Vitamin (MULTIVITAMIN) tablet Take 1 tablet by mouth 2 (two) times daily.     Marland Kitchen omeprazole (PRILOSEC) 20 MG capsule Take 1 capsule (20 mg total) by mouth daily. 90 capsule 3  . ondansetron (ZOFRAN) 8 MG tablet Take 1 tablet (8 mg total) by mouth every 8 (eight) hours as needed for refractory nausea / vomiting. Start on day 3 after chemo. 30 tablet 1  . prochlorperazine (COMPAZINE) 10 MG tablet Take 1 tablet (10 mg total) by mouth every 6 (six) hours as needed (Nausea or vomiting). 30 tablet 1  . simvastatin (ZOCOR) 20 MG tablet Take 1 tablet (20 mg total) by mouth daily. 90 tablet 3  . triamcinolone (NASACORT) 55 MCG/ACT AERO nasal inhaler Place 2 sprays into the nose 2 (two) times daily. 1 Inhaler 12   No current facility-administered medications for this visit.     PHYSICAL EXAMINATION: ECOG  PERFORMANCE STATUS: 1 - Symptomatic but completely ambulatory  Vitals:   06/30/18 1410  BP: (!) 150/75  Pulse: 63  Resp: 18  Temp: 97.7 F (36.5 C)  SpO2: 100%   Filed Weights   06/30/18 1410  Weight: 130 lb (59 kg)    GENERAL:alert, no distress  and comfortable SKIN: skin color, texture, turgor are normal, no rashes or significant lesions EYES: normal, Conjunctiva are pink and non-injected, sclera clear OROPHARYNX:no exudate, no erythema and lips, buccal mucosa, and tongue normal  NECK: supple, thyroid normal size, non-tender, without nodularity LYMPH:  no palpable lymphadenopathy in the cervical, axillary or inguinal LUNGS: clear to auscultation and percussion with normal breathing effort HEART: regular rate & rhythm and no murmurs and no lower extremity edema ABDOMEN:abdomen soft, non-tender and normal bowel sounds Musculoskeletal:no cyanosis of digits and no clubbing  NEURO: alert & oriented x 3 with fluent speech, no focal motor/sensory deficits  LABORATORY DATA:  I have reviewed the data as listed    Component Value Date/Time   NA 141 06/30/2018 1507   NA 140 02/29/2016 1027   K 4.3 06/30/2018 1507   CL 103 06/30/2018 1507   CO2 30 06/30/2018 1507   GLUCOSE 82 06/30/2018 1507   BUN 16 06/30/2018 1507   BUN 17 02/29/2016 1027   CREATININE 1.10 (H) 06/30/2018 1507   CREATININE 1.11 (H) 03/12/2018 0906   CALCIUM 9.9 06/30/2018 1507   PROT 7.5 06/30/2018 1507   PROT 7.0 02/29/2016 1027   ALBUMIN 3.8 06/30/2018 1507   ALBUMIN 4.3 02/29/2016 1027   AST 21 06/30/2018 1507   ALT 18 06/30/2018 1507   ALKPHOS 39 06/30/2018 1507   BILITOT 0.7 06/30/2018 1507   BILITOT 0.9 02/29/2016 1027   GFRNONAA 50 (L) 06/30/2018 1507   GFRNONAA 51 (L) 03/12/2018 0906   GFRAA 58 (L) 06/30/2018 1507   GFRAA 59 (L) 03/12/2018 0906    No results found for: SPEP, UPEP  Lab Results  Component Value Date   WBC 8.1 06/30/2018   NEUTROABS 5.1 06/30/2018   HGB 13.2 06/30/2018   HCT 40.6 06/30/2018   MCV 94.9 06/30/2018   PLT 296 06/30/2018      Chemistry      Component Value Date/Time   NA 141 06/30/2018 1507   NA 140 02/29/2016 1027   K 4.3 06/30/2018 1507   CL 103 06/30/2018 1507   CO2 30 06/30/2018 1507   BUN 16  06/30/2018 1507   BUN 17 02/29/2016 1027   CREATININE 1.10 (H) 06/30/2018 1507   CREATININE 1.11 (H) 03/12/2018 0906      Component Value Date/Time   CALCIUM 9.9 06/30/2018 1507   ALKPHOS 39 06/30/2018 1507   AST 21 06/30/2018 1507   ALT 18 06/30/2018 1507   BILITOT 0.7 06/30/2018 1507   BILITOT 0.9 02/29/2016 1027       RADIOGRAPHIC STUDIES: I have reviewed multiple imaging studies with the patient  I have personally reviewed the radiological images as listed and agreed with the findings in the report. Ct Chest W Contrast  Result Date: 06/28/2018 CLINICAL DATA:  Status post TAHBSO and infracolic omentectomy [id number]/29/9242 for 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 Staging evaluation. EXAM: CT CHEST, ABDOMEN, AND PELVIS WITH CONTRAST TECHNIQUE: Multidetector CT imaging of the chest, abdomen and pelvis was performed following the standard protocol during bolus administration of intravenous contrast. CONTRAST:  153m OMNIPAQUE IOHEXOL 300 MG/ML  SOLN COMPARISON:  05/15/2018 MRI pelvis.  04/29/2018 CT abdomen/pelvis. FINDINGS: CT  CHEST FINDINGS Cardiovascular: Normal heart size. No significant pericardial effusion/thickening. Right internal jugular MediPort terminates at the cavoatrial junction. Minimal atherosclerotic nonaneurysmal thoracic aorta. Normal caliber pulmonary arteries. No central pulmonary emboli. Mediastinum/Nodes: Dominant hypodense 1.3 cm right thyroid lobe nodule. Unremarkable esophagus. No pathologically enlarged axillary, mediastinal or hilar lymph nodes. Lungs/Pleura: No pneumothorax. No pleural effusion. No acute consolidative airspace disease, lung masses or significant pulmonary nodules. Musculoskeletal: No aggressive appearing focal osseous lesions. Minimal thoracic spondylosis. CT ABDOMEN PELVIS FINDINGS Hepatobiliary: Normal liver with no liver mass. Normal gallbladder with no radiopaque cholelithiasis. No biliary ductal dilatation. Pancreas: Normal, with no mass or duct  dilation. Spleen: Normal size. No mass. Adrenals/Urinary Tract: Normal adrenals. Normal kidneys with no hydronephrosis and no renal mass. Normal bladder. Stomach/Bowel: Small hiatal hernia. Otherwise normal nondistended stomach. Normal caliber small bowel with no small bowel wall thickening. Normal appendix. No definite large bowel wall thickening. No diverticulosis. No acute pericolonic fat stranding. Oral contrast transits to the rectum. Vascular/Lymphatic: Normal caliber abdominal aorta. Patent portal, splenic, hepatic and renal veins. No pathologically enlarged lymph nodes in the abdomen or pelvis. Reproductive: Interval hysterectomy and bilateral salpingo-oophorectomy. No adnexal masses. Left pelvic sidewall 6.3 x 5.0 cm simple fluid collection (series 2/image 99). Other: No pneumoperitoneum. No ascites. Vertical subcutaneous laparotomy scar with no superficial fluid collections. Musculoskeletal: No aggressive appearing focal osseous lesions. Stable L3 and L5 vertebral hemangiomas. Mild lumbar spondylosis. IMPRESSION: 1. No evidence of residual/recurrent tumor in the pelvis. 2. No evidence of metastatic disease in the chest, abdomen or pelvis. 3. Left pelvic sidewall 6.3 x 5.0 cm simple fluid collection, compatible with postsurgical seroma or lymphocele. 4. Small hiatal hernia. 5.  Aortic Atherosclerosis (ICD10-I70.0). Electronically Signed   By: Ilona Sorrel M.D.   On: 06/28/2018 23:30   Ct Abdomen Pelvis W Contrast  Result Date: 06/28/2018 CLINICAL DATA:  Status post TAHBSO and infracolic omentectomy 74/14/2395 for 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 Staging evaluation. EXAM: CT CHEST, ABDOMEN, AND PELVIS WITH CONTRAST TECHNIQUE: Multidetector CT imaging of the chest, abdomen and pelvis was performed following the standard protocol during bolus administration of intravenous contrast. CONTRAST:  134m OMNIPAQUE IOHEXOL 300 MG/ML  SOLN COMPARISON:  05/15/2018 MRI pelvis.  04/29/2018 CT abdomen/pelvis.  FINDINGS: CT CHEST FINDINGS Cardiovascular: Normal heart size. No significant pericardial effusion/thickening. Right internal jugular MediPort terminates at the cavoatrial junction. Minimal atherosclerotic nonaneurysmal thoracic aorta. Normal caliber pulmonary arteries. No central pulmonary emboli. Mediastinum/Nodes: Dominant hypodense 1.3 cm right thyroid lobe nodule. Unremarkable esophagus. No pathologically enlarged axillary, mediastinal or hilar lymph nodes. Lungs/Pleura: No pneumothorax. No pleural effusion. No acute consolidative airspace disease, lung masses or significant pulmonary nodules. Musculoskeletal: No aggressive appearing focal osseous lesions. Minimal thoracic spondylosis. CT ABDOMEN PELVIS FINDINGS Hepatobiliary: Normal liver with no liver mass. Normal gallbladder with no radiopaque cholelithiasis. No biliary ductal dilatation. Pancreas: Normal, with no mass or duct dilation. Spleen: Normal size. No mass. Adrenals/Urinary Tract: Normal adrenals. Normal kidneys with no hydronephrosis and no renal mass. Normal bladder. Stomach/Bowel: Small hiatal hernia. Otherwise normal nondistended stomach. Normal caliber small bowel with no small bowel wall thickening. Normal appendix. No definite large bowel wall thickening. No diverticulosis. No acute pericolonic fat stranding. Oral contrast transits to the rectum. Vascular/Lymphatic: Normal caliber abdominal aorta. Patent portal, splenic, hepatic and renal veins. No pathologically enlarged lymph nodes in the abdomen or pelvis. Reproductive: Interval hysterectomy and bilateral salpingo-oophorectomy. No adnexal masses. Left pelvic sidewall 6.3 x 5.0 cm simple fluid collection (series 2/image 99). Other: No pneumoperitoneum.  No ascites. Vertical subcutaneous laparotomy scar with no superficial fluid collections. Musculoskeletal: No aggressive appearing focal osseous lesions. Stable L3 and L5 vertebral hemangiomas. Mild lumbar spondylosis. IMPRESSION: 1. No  evidence of residual/recurrent tumor in the pelvis. 2. No evidence of metastatic disease in the chest, abdomen or pelvis. 3. Left pelvic sidewall 6.3 x 5.0 cm simple fluid collection, compatible with postsurgical seroma or lymphocele. 4. Small hiatal hernia. 5.  Aortic Atherosclerosis (ICD10-I70.0). Electronically Signed   By: Ilona Sorrel M.D.   On: 06/28/2018 23:30    All questions were answered. The patient knows to call the clinic with any problems, questions or concerns. No barriers to learning was detected.  I spent 30 minutes counseling the patient face to face. The total time spent in the appointment was 40 minutes and more than 50% was on counseling and review of test results  Heath Lark, MD 07/01/2018 7:40 AM

## 2018-07-02 ENCOUNTER — Telehealth: Payer: Self-pay | Admitting: Oncology

## 2018-07-02 NOTE — Telephone Encounter (Signed)
Melissa Simpson called and said she can't remember from her visit with Dr. Alvy Bimler why she needs to stop taking lostartan-HCTZ and if she can restart it a week after her treatment.  She also forgot to add glucosimine and omega 3 fish oil to her med list and wants to make sure they are ok to take during chemotherapy.

## 2018-07-02 NOTE — Telephone Encounter (Signed)
Called Melissa Simpson back and advised her of Dr. Calton Dach message.  She verbalized agreement and understanding.

## 2018-07-02 NOTE — Telephone Encounter (Signed)
OK for supplement HOLD BP meds for 1 week due to high risk of dehydration and low BP after chemo. IF she has no side-effects, she can continue

## 2018-07-03 NOTE — Progress Notes (Signed)
[facility] Progress Note   Melissa Simpson [id number] [birth year] 67 y.o.  Melissa Simpson is managed by Dr. Heath Lark  Actively treated with chemotherapy/immunotherapy: yes  Current Therapy: Carboplatin and paclitaxel  Last Treated: 07/01/2018 (cycle 1, day 1)  Assessment: Plan:    Essential hypertension  Left ovarian epithelial cancer ([place])   Essential hypertension: Patient was seen in the infusion room for elevation of her blood pressure after starting Taxol.  She is on Hyzaar 100-12.5 mg metoprolol 50 mg.  She has taken her medications today.  She was given clonidine 0.1 p.o. x1.  She was asymptomatic.  Her blood pressure did improve but continued to be elevated.  A repeat blood pressure returned at 157/95.  She was able to continue her infusion of Taxol followed by carboplatin.  Ovarian cancer: The patient is receiving cycle 1, day 1 of carboplatin and paclitaxel today.  She will return to see Dr. Heath Lark on 07/22/2018.  Please see After Visit Summary for patient specific instructions.  Future Appointments  Date Time Provider [place]  07/22/2018  9:30 AM [facility] LAB 2 [facility] None  07/22/2018  9:45 AM [facility] [place] FLUSH [facility] None  07/22/2018 10:00 AM [facility] INFUSION [facility] None  07/22/2018 10:15 AM Alvy Bimler, Ni, MD [facility] None  08/12/2018  9:15 AM [facility] LAB 2 [facility] None  08/12/2018  9:30 AM [facility] [place] FLUSH [facility] None  08/12/2018 10:00 AM [facility] INFUSION [facility] None  08/12/2018 10:15 AM Heath Lark, MD [facility] None  08/19/2018  9:45 AM Marti Sleigh, MD [facility] None  09/15/2018  9:15 AM Titus Dubin, RN [facility] None  09/18/2018  9:00 AM Mariea Clonts, Tiffany L, DO [facility] None    No orders of the defined types were placed in this encounter.      Subjective:   Patient ID:  Melissa Simpson is a 67 y.o. (DOB 1950-11-21) female.  Chief Complaint: No chief complaint on  file.   HPI Melissa Simpson is a 67 year old female with a history of an epithelial cancer of the left ovary who is managed by Dr. Heath Lark and is receiving cycle 1, day 1 of carboplatin and paclitaxel today.  She was seen in the infusion room when it was noted to have her blood pressure had risen to 159/139 as she was being dosed with paclitaxel.  The patient is on antihypertensive medications and takes Hyzaar and metoprolol.  She did take her medicines this morning.  She denies any chest pain, shortness of breath, headache, or other symptoms.  She was premedicated with the following:  Aloxi, Benadryl, dexamethasone, Emend, and Pepcid.  Medications: I have reviewed the patient's current medications.  Allergies: No Known Allergies  Past Medical History:  Diagnosis Date  . Cancer ([facility])    left ovarian cancer  . Family history of colon cancer   . Family history of ovarian cancer   . GERD (gastroesophageal reflux disease)   . Hyperlipidemia   . Hypertension     Past Surgical History:  Procedure Laterality Date  . ABDOMINAL HYSTERECTOMY    . BIOPSY THYROID  2008  . COLONOSCOPY    . TONSILECTOMY, ADENOIDECTOMY, BILATERAL MYRINGOTOMY AND TUBES Bilateral 1957    Family History  Problem Relation Age of Onset  . Hypertension Father   . Diabetes Father   . Coronary artery disease Father   . Hyperlipidemia Father   . Colon cancer Father        67's  . Hypertension Mother   .  Arthritis Mother   . Cancer Maternal Aunt        type unk  . Ovarian cancer Cousin 31  . Cancer Cousin        mouth    Social History   Socioeconomic History  . Marital status: Married    Spouse name: Louie Casa  . Number of children: 3  . Years of education: Not on file  . Highest education level: Not on file  Occupational History  . Occupation: retired Solicitor  Social Needs  . Financial resource strain: Not hard at all  . Food insecurity:    Worry: Never true    Inability: Never true  .  Transportation needs:    Medical: No    Non-medical: No  Tobacco Use  . Smoking status: Never Smoker  . Smokeless tobacco: Never Used  Substance and Sexual Activity  . Alcohol use: No  . Drug use: No  . Sexual activity: Not on file  Lifestyle  . Physical activity:    Days per week: 0 days    Minutes per session: 0 min  . Stress: Only a little  Relationships  . Social connections:    Talks on phone: Never    Gets together: More than three times a week    Attends religious service: 1 to 4 times per year    Active member of club or organization: No    Attends meetings of clubs or organizations: Never    Relationship status: Married  . Intimate partner violence:    Fear of current or ex partner: No    Emotionally abused: No    Physically abused: No    Forced sexual activity: No  Other Topics Concern  . Not on file  Social History Narrative   Diet:Unrestricted   Do you drink/eat things with caffeine? Rarely   Marital status: Married                             What year were you married? 1992   Do you live in a house, apartment, assisted living, condo, trailer, etc)? House   Is it one or more stories? 1   How many persons live in your home? 3   Do you have any pets in your home?  2 small dogs   Current or past profession: Chief of Staff   Do you exercise?                                                     Type & how often:   Do you have a living will? No   Do you have a DNR Form? No   Do you have a POA/HPOA forms? NO    Past Medical History, Surgical history, Social history, and Family history were reviewed and updated as appropriate.   Please see review of systems for further details on the patient's review from today.   Review of Systems:  Review of Systems  Constitutional: Negative for chills, diaphoresis and fever.  HENT: Negative for trouble swallowing and voice change.   Respiratory: Negative for cough, chest tightness, shortness of breath and wheezing.     Cardiovascular: Negative for chest pain and palpitations.  Gastrointestinal: Negative for abdominal pain, constipation, diarrhea, nausea and vomiting.  Musculoskeletal: Negative for back pain  and myalgias.  Neurological: Negative for dizziness, light-headedness and headaches.    Objective:   Physical Exam:  There were no vitals taken for this visit. ECOG: 0  Physical Exam  Constitutional: No distress.  HENT:  Head: Normocephalic and atraumatic.  Cardiovascular: Normal rate, regular rhythm and normal heart sounds. Exam reveals no gallop and no friction rub.  No murmur heard. Pulmonary/Chest: Effort normal and breath sounds normal. No respiratory distress. She has no wheezes. She has no rales.  Neurological: She is alert.  Skin: Skin is warm and dry. No rash noted. She is not diaphoretic. No erythema.    Lab Review:     Component Value Date/Time   NA 141 06/30/2018 1507   NA 140 02/29/2016 1027   K 4.3 06/30/2018 1507   CL 103 06/30/2018 1507   CO2 30 06/30/2018 1507   GLUCOSE 82 06/30/2018 1507   BUN 16 06/30/2018 1507   BUN 17 02/29/2016 1027   CREATININE 1.10 (H) 06/30/2018 1507   CREATININE 1.11 (H) 03/12/2018 0906   CALCIUM 9.9 06/30/2018 1507   PROT 7.5 06/30/2018 1507   PROT 7.0 02/29/2016 1027   ALBUMIN 3.8 06/30/2018 1507   ALBUMIN 4.3 02/29/2016 1027   AST 21 06/30/2018 1507   ALT 18 06/30/2018 1507   ALKPHOS 39 06/30/2018 1507   BILITOT 0.7 06/30/2018 1507   BILITOT 0.9 02/29/2016 1027   GFRNONAA 50 (L) 06/30/2018 1507   GFRNONAA 51 (L) 03/12/2018 0906   GFRAA 58 (L) 06/30/2018 1507   GFRAA 59 (L) 03/12/2018 0906       Component Value Date/Time   WBC 8.1 06/30/2018 1507   RBC 4.28 06/30/2018 1507   HGB 13.2 06/30/2018 1507   HGB 14.6 02/29/2016 1027   HCT 40.6 06/30/2018 1507   HCT 43.0 02/29/2016 1027   PLT 296 06/30/2018 1507   PLT 279 02/29/2016 1027   MCV 94.9 06/30/2018 1507   MCV 95 02/29/2016 1027   MCH 30.8 06/30/2018 1507   MCHC  32.5 06/30/2018 1507   RDW 15.4 06/30/2018 1507   RDW 13.0 02/29/2016 1027   LYMPHSABS 1.9 06/30/2018 1507   LYMPHSABS 2.4 02/29/2016 1027   MONOABS 0.7 06/30/2018 1507   EOSABS 0.3 06/30/2018 1507   EOSABS 0.2 02/29/2016 1027   BASOSABS 0.1 06/30/2018 1507   BASOSABS 0.0 02/29/2016 1027   -------------------------------  Imaging from last 24 hours (if applicable):  Radiology interpretation: Ct Chest W Contrast  Result Date: 06/28/2018 CLINICAL DATA:  Status post TAHBSO and infracolic omentectomy 24/58/0998 for 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 Staging evaluation. EXAM: CT CHEST, ABDOMEN, AND PELVIS WITH CONTRAST TECHNIQUE: Multidetector CT imaging of the chest, abdomen and pelvis was performed following the standard protocol during bolus administration of intravenous contrast. CONTRAST:  148mL OMNIPAQUE IOHEXOL 300 MG/ML  SOLN COMPARISON:  05/15/2018 MRI pelvis.  04/29/2018 CT abdomen/pelvis. FINDINGS: CT CHEST FINDINGS Cardiovascular: Normal heart size. No significant pericardial effusion/thickening. Right internal jugular MediPort terminates at the cavoatrial junction. Minimal atherosclerotic nonaneurysmal thoracic aorta. Normal caliber pulmonary arteries. No central pulmonary emboli. Mediastinum/Nodes: Dominant hypodense 1.3 cm right thyroid lobe nodule. Unremarkable esophagus. No pathologically enlarged axillary, mediastinal or hilar lymph nodes. Lungs/Pleura: No pneumothorax. No pleural effusion. No acute consolidative airspace disease, lung masses or significant pulmonary nodules. Musculoskeletal: No aggressive appearing focal osseous lesions. Minimal thoracic spondylosis. CT ABDOMEN PELVIS FINDINGS Hepatobiliary: Normal liver with no liver mass. Normal gallbladder with no radiopaque cholelithiasis. No biliary ductal dilatation. Pancreas: Normal, with no mass or  duct dilation. Spleen: Normal size. No mass. Adrenals/Urinary Tract: Normal adrenals. Normal kidneys with no hydronephrosis  and no renal mass. Normal bladder. Stomach/Bowel: Small hiatal hernia. Otherwise normal nondistended stomach. Normal caliber small bowel with no small bowel wall thickening. Normal appendix. No definite large bowel wall thickening. No diverticulosis. No acute pericolonic fat stranding. Oral contrast transits to the rectum. Vascular/Lymphatic: Normal caliber abdominal aorta. Patent portal, splenic, hepatic and renal veins. No pathologically enlarged lymph nodes in the abdomen or pelvis. Reproductive: Interval hysterectomy and bilateral salpingo-oophorectomy. No adnexal masses. Left pelvic sidewall 6.3 x 5.0 cm simple fluid collection (series 2/image 99). Other: No pneumoperitoneum. No ascites. Vertical subcutaneous laparotomy scar with no superficial fluid collections. Musculoskeletal: No aggressive appearing focal osseous lesions. Stable L3 and L5 vertebral hemangiomas. Mild lumbar spondylosis. IMPRESSION: 1. No evidence of residual/recurrent tumor in the pelvis. 2. No evidence of metastatic disease in the chest, abdomen or pelvis. 3. Left pelvic sidewall 6.3 x 5.0 cm simple fluid collection, compatible with postsurgical seroma or lymphocele. 4. Small hiatal hernia. 5.  Aortic Atherosclerosis (ICD10-I70.0). Electronically Signed   By: Ilona Sorrel M.D.   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 Staging evaluation. EXAM: CT CHEST, ABDOMEN, AND PELVIS WITH CONTRAST TECHNIQUE: Multidetector CT imaging of the chest, abdomen and pelvis was performed following the standard protocol during bolus administration of intravenous contrast. CONTRAST:  123mL OMNIPAQUE IOHEXOL 300 MG/ML  SOLN COMPARISON:  05/15/2018 MRI pelvis.  04/29/2018 CT abdomen/pelvis. FINDINGS: CT CHEST FINDINGS Cardiovascular: Normal heart size. No significant pericardial effusion/thickening. Right  internal jugular MediPort terminates at the cavoatrial junction. Minimal atherosclerotic nonaneurysmal thoracic aorta. Normal caliber pulmonary arteries. No central pulmonary emboli. Mediastinum/Nodes: Dominant hypodense 1.3 cm right thyroid lobe nodule. Unremarkable esophagus. No pathologically enlarged axillary, mediastinal or hilar lymph nodes. Lungs/Pleura: No pneumothorax. No pleural effusion. No acute consolidative airspace disease, lung masses or significant pulmonary nodules. Musculoskeletal: No aggressive appearing focal osseous lesions. Minimal thoracic spondylosis. CT ABDOMEN PELVIS FINDINGS Hepatobiliary: Normal liver with no liver mass. Normal gallbladder with no radiopaque cholelithiasis. No biliary ductal dilatation. Pancreas: Normal, with no mass or duct dilation. Spleen: Normal size. No mass. Adrenals/Urinary Tract: Normal adrenals. Normal kidneys with no hydronephrosis and no renal mass. Normal bladder. Stomach/Bowel: Small hiatal hernia. Otherwise normal nondistended stomach. Normal caliber small bowel with no small bowel wall thickening. Normal appendix. No definite large bowel wall thickening. No diverticulosis. No acute pericolonic fat stranding. Oral contrast transits to the rectum. Vascular/Lymphatic: Normal caliber abdominal aorta. Patent portal, splenic, hepatic and renal veins. No pathologically enlarged lymph nodes in the abdomen or pelvis. Reproductive: Interval hysterectomy and bilateral salpingo-oophorectomy. No adnexal masses. Left pelvic sidewall 6.3 x 5.0 cm simple fluid collection (series 2/image 99). Other: No pneumoperitoneum. No ascites. Vertical subcutaneous laparotomy scar with no superficial fluid collections. Musculoskeletal: No aggressive appearing focal osseous lesions. Stable L3 and L5 vertebral hemangiomas. Mild lumbar spondylosis. IMPRESSION: 1. No evidence of residual/recurrent tumor in the pelvis. 2. No evidence of metastatic disease in the chest, abdomen or pelvis.  3. Left pelvic sidewall 6.3 x 5.0 cm simple fluid collection, compatible with postsurgical seroma or lymphocele. 4. Small hiatal hernia. 5.  Aortic Atherosclerosis (ICD10-I70.0). Electronically Signed   By: Ilona Sorrel M.D.   On: 06/28/2018 23:30

## 2018-07-11 ENCOUNTER — Telehealth: Payer: Self-pay | Admitting: Genetics

## 2018-07-11 NOTE — Telephone Encounter (Signed)
Revealed negative germlinegenetic testing.    This normal result is reassuring and indicates that it is unlikely Melissa Simpson's cancer is due to a hereditary cause.  It is unlikely that there is an increased risk of another cancer due to a mutation in one of these genes.  However, genetic testing is not perfect, and cannot definitively rule out a hereditary cause.  It will be important for her to keep in contact with genetics to learn if any additional testing may be needed in the future.     I informed her we are still waiting to get the results back from her tumor HRD genetic testing.  I will call to tell her those when they are available.   (orange color in result just indicates increased colon screening is recommended based on family history of colon cancer- no genetic mutations identified)

## 2018-07-14 ENCOUNTER — Other Ambulatory Visit: Payer: Self-pay | Admitting: Hematology and Oncology

## 2018-07-14 DIAGNOSIS — C562 Malignant neoplasm of left ovary: Secondary | ICD-10-CM

## 2018-07-15 ENCOUNTER — Inpatient Hospital Stay: Payer: Medicare HMO | Attending: Obstetrics | Admitting: Medical

## 2018-07-15 ENCOUNTER — Ambulatory Visit (HOSPITAL_BASED_OUTPATIENT_CLINIC_OR_DEPARTMENT_OTHER)
Admission: RE | Admit: 2018-07-15 | Discharge: 2018-07-15 | Disposition: A | Discharge status: Home / Self Care | Payer: Medicare HMO | Source: Ambulatory Visit | Attending: Medical | Admitting: Medical

## 2018-07-15 ENCOUNTER — Telehealth: Payer: Self-pay | Admitting: Oncology

## 2018-07-15 ENCOUNTER — Ambulatory Visit (HOSPITAL_COMMUNITY)
Admission: RE | Admit: 2018-07-15 | Discharge: 2018-07-15 | Disposition: A | Discharge status: Home / Self Care | Payer: Medicare HMO | Source: Ambulatory Visit | Attending: Medical | Admitting: Medical

## 2018-07-15 ENCOUNTER — Encounter (HOSPITAL_COMMUNITY): Payer: Self-pay | Admitting: Interventional Radiology

## 2018-07-15 ENCOUNTER — Other Ambulatory Visit: Payer: Self-pay | Admitting: Medical

## 2018-07-15 VITALS — BP 148/98 | HR 66 | Temp 99.7°F | Resp 17 | Ht <= 58 in | Wt 131.1 lb

## 2018-07-15 DIAGNOSIS — Z7901 Long term (current) use of anticoagulants: Secondary | ICD-10-CM | POA: Insufficient documentation

## 2018-07-15 DIAGNOSIS — R221 Localized swelling, mass and lump, neck: Secondary | ICD-10-CM | POA: Insufficient documentation

## 2018-07-15 DIAGNOSIS — E041 Nontoxic single thyroid nodule: Secondary | ICD-10-CM | POA: Diagnosis not present

## 2018-07-15 DIAGNOSIS — M7989 Other specified soft tissue disorders: Secondary | ICD-10-CM

## 2018-07-15 DIAGNOSIS — Z452 Encounter for adjustment and management of vascular access device: Secondary | ICD-10-CM | POA: Insufficient documentation

## 2018-07-15 DIAGNOSIS — M542 Cervicalgia: Secondary | ICD-10-CM | POA: Insufficient documentation

## 2018-07-15 DIAGNOSIS — R9389 Abnormal findings on diagnostic imaging of other specified body structures: Secondary | ICD-10-CM

## 2018-07-15 DIAGNOSIS — C562 Malignant neoplasm of left ovary: Secondary | ICD-10-CM | POA: Diagnosis not present

## 2018-07-15 DIAGNOSIS — I82C11 Acute embolism and thrombosis of right internal jugular vein: Secondary | ICD-10-CM | POA: Insufficient documentation

## 2018-07-15 DIAGNOSIS — Z79899 Other long term (current) drug therapy: Secondary | ICD-10-CM | POA: Insufficient documentation

## 2018-07-15 DIAGNOSIS — I10 Essential (primary) hypertension: Secondary | ICD-10-CM | POA: Diagnosis not present

## 2018-07-15 DIAGNOSIS — I8290 Acute embolism and thrombosis of unspecified vein: Secondary | ICD-10-CM

## 2018-07-15 DIAGNOSIS — Z5111 Encounter for antineoplastic chemotherapy: Secondary | ICD-10-CM | POA: Insufficient documentation

## 2018-07-15 HISTORY — PX: IR CV LINE INJECTION: IMG2294

## 2018-07-15 MED ORDER — HEPARIN SOD (PORK) LOCK FLUSH 100 UNIT/ML IV SOLN
INTRAVENOUS | Status: AC
  Filled 2018-07-15: qty 5

## 2018-07-15 MED ORDER — IOPAMIDOL (ISOVUE-300) INJECTION 61%
INTRAVENOUS | Status: AC
  Administered 2018-07-15: 10 mL via INTRAVENOUS
  Filled 2018-07-15: qty 50

## 2018-07-15 MED ORDER — IOPAMIDOL (ISOVUE-300) INJECTION 61%
50.0000 mL | Freq: Once | INTRAVENOUS | Status: AC | PRN
  Administered 2018-07-15: 10 mL via INTRAVENOUS

## 2018-07-15 MED ORDER — RIVAROXABAN (XARELTO) VTE STARTER PACK (15 & 20 MG): ORAL_TABLET | ORAL | 0 refills | Status: DC

## 2018-07-15 NOTE — Patient Instructions (Signed)
Implanted Port Home Guide An implanted port is a type of central line that is placed under the skin. Central lines are used to provide IV access when treatment or nutrition needs to be given through a person's veins. Implanted ports are used for long-term IV access. An implanted port may be placed because:  You need IV medicine that would be irritating to the small veins in your hands or arms.  You need long-term IV medicines, such as antibiotics.  You need IV nutrition for a long period.  You need frequent blood draws for lab tests.  You need dialysis.  Implanted ports are usually placed in the chest area, but they can also be placed in the upper arm, the abdomen, or the leg. An implanted port has two main parts:  Reservoir. The reservoir is round and will appear as a small, raised area under your skin. The reservoir is the part where a needle is inserted to give medicines or draw blood.  Catheter. The catheter is a thin, flexible tube that extends from the reservoir. The catheter is placed into a large vein. Medicine that is inserted into the reservoir goes into the catheter and then into the vein.  How will I care for my incision site? Do not get the incision site wet. Bathe or shower as directed by your health care provider. How is my port accessed? Special steps must be taken to access the port:  Before the port is accessed, a numbing cream can be placed on the skin. This helps numb the skin over the port site.  Your health care provider uses a sterile technique to access the port. ? Your health care provider must put on a mask and sterile gloves. ? The skin over your port is cleaned carefully with an antiseptic and allowed to dry. ? The port is gently pinched between sterile gloves, and a needle is inserted into the port.  Only "non-coring" port needles should be used to access the port. Once the port is accessed, a blood return should be checked. This helps ensure that the port  is in the vein and is not clogged.  If your port needs to remain accessed for a constant infusion, a clear (transparent) bandage will be placed over the needle site. The bandage and needle will need to be changed every week, or as directed by your health care provider.  Keep the bandage covering the needle clean and dry. Do not get it wet. Follow your health care provider's instructions on how to take a shower or bath while the port is accessed.  If your port does not need to stay accessed, no bandage is needed over the port.  What is flushing? Flushing helps keep the port from getting clogged. Follow your health care provider's instructions on how and when to flush the port. Ports are usually flushed with saline solution or a medicine called heparin. The need for flushing will depend on how the port is used.  If the port is used for intermittent medicines or blood draws, the port will need to be flushed: ? After medicines have been given. ? After blood has been drawn. ? As part of routine maintenance.  If a constant infusion is running, the port may not need to be flushed.  How long will my port stay implanted? The port can stay in for as long as your health care provider thinks it is needed. When it is time for the port to come out, surgery will be   done to remove it. The procedure is similar to the one performed when the port was put in. When should I seek immediate medical care? When you have an implanted port, you should seek immediate medical care if:  You notice a bad smell coming from the incision site.  You have swelling, redness, or drainage at the incision site.  You have more swelling or pain at the port site or the surrounding area.  You have a fever that is not controlled with medicine.  This information is not intended to replace advice given to you by your health care provider. Make sure you discuss any questions you have with your health care provider. Document  Released: 08/27/2005 Document Revised: 02/02/2016 Document Reviewed: 05/04/2013 Elsevier Interactive Patient Education  2017 [employer]  

## 2018-07-15 NOTE — Progress Notes (Signed)
Pt presents with swelling and tenderness along R side of neck.  Hardened mobile tender lump on upper side of neck near jawline.  No redness or rash.  No recent injury.  Denies CP or SOB or trouble swallowing.  Currently has a headache.

## 2018-07-15 NOTE — Progress Notes (Addendum)
*  Preliminary Results* Right upper extremity venous duplex completed. Right upper extremity is postive for deep vein thrombosis in the internal jugular vein.  07/15/2018 2:33 PM Charina Fons Dawna Part

## 2018-07-15 NOTE — Telephone Encounter (Signed)
Melissa Simpson called and said her right neck has been sore since Sunday.  She said it has gotten worse and is now swollen.  She denies having any redness to the area or any fever.  She would like to see Symptom Management today.

## 2018-07-16 ENCOUNTER — Telehealth: Payer: Self-pay | Admitting: Medical

## 2018-07-16 ENCOUNTER — Telehealth: Payer: Self-pay | Admitting: Hematology and Oncology

## 2018-07-16 NOTE — Telephone Encounter (Signed)
NG out 11/12 - lab/port/fu moved to 11/11 with YF - treatment remains 11/12. Spoke with patient.

## 2018-07-16 NOTE — Telephone Encounter (Signed)
No los per 11/5. °

## 2018-07-18 ENCOUNTER — Telehealth: Payer: Self-pay | Admitting: Genetics

## 2018-07-18 ENCOUNTER — Telehealth: Payer: Self-pay | Admitting: Oncology

## 2018-07-18 NOTE — Telephone Encounter (Signed)
Melissa Simpson called and said her right neck is still sore and swollen and is not any better with the Xarelto.  She also said it is hard to swallow and she has some ear pain.  Called Sandi Mealy, PA who advised that it can take days for the swelling to go down and that the Xarelto will stabilize the clot.  He recommended using heat on the area and tylenol.  Called Melissa Simpson and left her a message with these recommendations.

## 2018-07-18 NOTE — Telephone Encounter (Signed)
Revealed to [name].    These were positive for a suspected deleterious mutation in BRCA1 in the tumor. This result was not found in her blood sample and therefore was not hereditary/running in the family.  The presence of this mutation gives her oncologists more information about her cancer that may impact treatment options for Melissa Simpson.  She should discuss any questions about her treatment plan with her oncologist.   Recommended her maternal relatives also have genetic testing (due to cousin with ovarian cancer).

## 2018-07-18 NOTE — Progress Notes (Signed)
[facility] Progress Note   Melissa Simpson [id number] 16-Sep-1950 67 y.o.  Melissa Simpson is managed by Dr. Heath Lark  Actively treated with chemotherapy/immunotherapy: yes  Current Therapy: Carboplatin and paclitaxel  Last Treated: 07/01/2018 (cycle 1, day 1)  Assessment: Plan:    Neck pain on right side - Plan: IR CV Line Injection, VAS [place] UPPER EXTREMITY VENOUS DUPLEX, CANCELED: IR Radiologist Eval & Mgmt, CANCELED: IR CV Line Injection, CANCELED: US Soft Tissue Head/Neck  Neck swelling - Plan: VAS [place] UPPER EXTREMITY VENOUS DUPLEX, CANCELED: IR CV Line Injection, CANCELED: US Soft Tissue Head/Neck  Acute deep vein thrombosis (DVT) of non-extremity vein - Plan: Rivaroxaban (XARELTO STARTER PACK) 15 & 20 MG TBPK   Right neck pain and swelling: The patient was referred for an ultrasound of her neck and an IV study of her port.  The IV study returned showing that the patient's port was patent.  The Doppler returned showing that the patient had a DVT of the jugular vein.  Acute DVT of the right jugular vein: This case was discussed with Dr. Dominica Severin B. Sherrill.  Upon his recommendations the patient was placed on Xarelto 15 mg twice daily for 21 days with plans to transition to 20 mg once daily on day 22 of therapy.  The total length of therapy for Xarelto will need to be determined by Dr. Heath Lark.  Please see After Visit Summary for patient specific instructions.  Future Appointments  Date Time Provider [place]  07/21/2018 12:30 PM [facility] LAB 5 [facility] None  07/21/2018 12:45 PM [facility] [place] None  07/21/2018  1:30 PM Truitt Merle, MD [facility] None  07/22/2018 10:00 AM [facility] INFUSION [facility] None  08/12/2018  9:15 AM [facility] LAB 2 [facility] None  08/12/2018  9:30 AM [facility] [place] FLUSH [facility] None  08/12/2018 10:00 AM [facility] INFUSION [facility] None  08/12/2018 10:15 AM Heath Lark, MD [facility] None    08/19/2018  9:45 AM Marti Sleigh, MD [facility] None  09/15/2018  9:15 AM Titus Dubin, RN [facility] None  09/18/2018  9:00 AM Gayland Curry, DO [facility] None    Orders Placed This Encounter  Procedures  . IR CV Line Injection       Subjective:   Patient ID:  Melissa Simpson is a 67 y.o. (DOB 1951-05-21) female.  Chief Complaint:  Chief Complaint  Patient presents with  . Edema    neck    HPI Melissa Simpson is a 67 year old female with a history of ovarian cancer who is managed by Dr. Heath Lark and is status post cycle 1, day 1 of carboplatin and paclitaxel which was dosed on 07/01/2018.  She presents to the clinic today with right-sided neck swelling and tenderness.  She describes the area of enlargement in her left neck as a "lump which has discrete borders and is mobile."  She has had a headache on the right side of her head.  She denies chest pain, shortness of breath, dizziness, weakness, trauma, or changes in activity.  Medications: I have reviewed the patient's current medications.  Allergies: No Known Allergies  Past Medical History:  Diagnosis Date  . Cancer ([place])    left ovarian cancer  . Family history of colon cancer   . Family history of ovarian cancer   . GERD (gastroesophageal reflux disease)   . Hyperlipidemia   . Hypertension     Past Surgical History:  Procedure Laterality Date  . ABDOMINAL HYSTERECTOMY    .  BIOPSY THYROID  2008  . COLONOSCOPY    . IR CV LINE INJECTION  07/15/2018  . TONSILECTOMY, ADENOIDECTOMY, BILATERAL MYRINGOTOMY AND TUBES Bilateral 1957    Family History  Problem Relation Age of Onset  . Hypertension Father   . Diabetes Father   . Coronary artery disease Father   . Hyperlipidemia Father   . Colon cancer Father        49's  . Hypertension Mother   . Arthritis Mother   . Cancer Maternal Aunt        type unk  . Ovarian cancer Cousin 78  . Cancer Cousin        mouth    Social History   Socioeconomic  History  . Marital status: Married    Spouse name: Louie Casa  . Number of children: 3  . Years of education: Not on file  . Highest education level: Not on file  Occupational History  . Occupation: retired Solicitor  Social Needs  . Financial resource strain: Not hard at all  . Food insecurity:    Worry: Never true    Inability: Never true  . Transportation needs:    Medical: No    Non-medical: No  Tobacco Use  . Smoking status: Never Smoker  . Smokeless tobacco: Never Used  Substance and Sexual Activity  . Alcohol use: No  . Drug use: No  . Sexual activity: Not on file  Lifestyle  . Physical activity:    Days per week: 0 days    Minutes per session: 0 min  . Stress: Only a little  Relationships  . Social connections:    Talks on phone: Never    Gets together: More than three times a week    Attends religious service: 1 to 4 times per year    Active member of club or organization: No    Attends meetings of clubs or organizations: Never    Relationship status: Married  . Intimate partner violence:    Fear of current or ex partner: No    Emotionally abused: No    Physically abused: No    Forced sexual activity: No  Other Topics Concern  . Not on file  Social History Narrative   Diet:Unrestricted   Do you drink/eat things with caffeine? Rarely   Marital status: Married                             What year were you married? 1992   Do you live in a house, apartment, assisted living, condo, trailer, etc)? House   Is it one or more stories? 1   How many persons live in your home? 3   Do you have any pets in your home?  2 small dogs   Current or past profession: Chief of Staff   Do you exercise?                                                     Type & how often:   Do you have a living will? No   Do you have a DNR Form? No   Do you have a POA/HPOA forms? NO    Past Medical History, Surgical history, Social history, and Family history were reviewed and  updated as appropriate.  Please see review of systems for further details on the patient's review from today.   Review of Systems:  Review of Systems  Constitutional: Negative for chills, diaphoresis and fever.  HENT: Negative for trouble swallowing and voice change.   Respiratory: Negative for cough, chest tightness, shortness of breath and wheezing.   Cardiovascular: Negative for chest pain and palpitations.  Gastrointestinal: Negative for abdominal pain, constipation, diarrhea, nausea and vomiting.  Musculoskeletal: Positive for neck pain. Negative for back pain and myalgias.       Right neck swelling  Neurological: Negative for dizziness, light-headedness and headaches.    Objective:   Physical Exam:  BP (!) 148/98 (BP Location: Left Arm, Patient Position: Sitting)   Pulse 66   Temp 99.7 F (37.6 C) (Oral)   Resp 17   Ht 4\' 10"  (1.473 m)   Wt 131 lb 1.6 oz (59.5 kg)   SpO2 99%   BMI 27.40 kg/m  ECOG: 0  Physical Exam  Constitutional: No distress.  HENT:  Head: Normocephalic and atraumatic.  Neck:  There is a raised tender mass in the right lateral neck.  Cardiovascular: Normal rate, regular rhythm and normal heart sounds. Exam reveals no gallop and no friction rub.  No murmur heard. Pulmonary/Chest: Effort normal and breath sounds normal. No respiratory distress. She has no wheezes. She has no rales.  Neurological: She is alert.  Skin: Skin is warm and dry. No rash noted. She is not diaphoretic. No erythema.    Lab Review:     Component Value Date/Time   NA 141 06/30/2018 1507   NA 140 02/29/2016 1027   K 4.3 06/30/2018 1507   CL 103 06/30/2018 1507   CO2 30 06/30/2018 1507   GLUCOSE 82 06/30/2018 1507   BUN 16 06/30/2018 1507   BUN 17 02/29/2016 1027   CREATININE 1.10 (H) 06/30/2018 1507   CREATININE 1.11 (H) 03/12/2018 0906   CALCIUM 9.9 06/30/2018 1507   PROT 7.5 06/30/2018 1507   PROT 7.0 02/29/2016 1027   ALBUMIN 3.8 06/30/2018 1507   ALBUMIN 4.3  02/29/2016 1027   AST 21 06/30/2018 1507   ALT 18 06/30/2018 1507   ALKPHOS 39 06/30/2018 1507   BILITOT 0.7 06/30/2018 1507   BILITOT 0.9 02/29/2016 1027   GFRNONAA 50 (L) 06/30/2018 1507   GFRNONAA 51 (L) 03/12/2018 0906   GFRAA 58 (L) 06/30/2018 1507   GFRAA 59 (L) 03/12/2018 0906       Component Value Date/Time   WBC 8.1 06/30/2018 1507   RBC 4.28 06/30/2018 1507   HGB 13.2 06/30/2018 1507   HGB 14.6 02/29/2016 1027   HCT 40.6 06/30/2018 1507   HCT 43.0 02/29/2016 1027   PLT 296 06/30/2018 1507   PLT 279 02/29/2016 1027   MCV 94.9 06/30/2018 1507   MCV 95 02/29/2016 1027   MCH 30.8 06/30/2018 1507   MCHC 32.5 06/30/2018 1507   RDW 15.4 06/30/2018 1507   RDW 13.0 02/29/2016 1027   LYMPHSABS 1.9 06/30/2018 1507   LYMPHSABS 2.4 02/29/2016 1027   MONOABS 0.7 06/30/2018 1507   EOSABS 0.3 06/30/2018 1507   EOSABS 0.2 02/29/2016 1027   BASOSABS 0.1 06/30/2018 1507   BASOSABS 0.0 02/29/2016 1027   -------------------------------  Imaging from last 24 hours (if applicable):  Radiology interpretation: Ct Chest W Contrast  Result Date: 06/28/2018 CLINICAL DATA:  Status post TAHBSO and infracolic omentectomy 37/16/9678 for 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 Staging evaluation. EXAM: CT CHEST, ABDOMEN, AND PELVIS  WITH CONTRAST TECHNIQUE: Multidetector CT imaging of the chest, abdomen and pelvis was performed following the standard protocol during bolus administration of intravenous contrast. CONTRAST:  171mL OMNIPAQUE IOHEXOL 300 MG/ML  SOLN COMPARISON:  05/15/2018 MRI pelvis.  04/29/2018 CT abdomen/pelvis. FINDINGS: CT CHEST FINDINGS Cardiovascular: Normal heart size. No significant pericardial effusion/thickening. Right internal jugular MediPort terminates at the cavoatrial junction. Minimal atherosclerotic nonaneurysmal thoracic aorta. Normal caliber pulmonary arteries. No central pulmonary emboli. Mediastinum/Nodes: Dominant hypodense 1.3 cm right thyroid lobe nodule.  Unremarkable esophagus. No pathologically enlarged axillary, mediastinal or hilar lymph nodes. Lungs/Pleura: No pneumothorax. No pleural effusion. No acute consolidative airspace disease, lung masses or significant pulmonary nodules. Musculoskeletal: No aggressive appearing focal osseous lesions. Minimal thoracic spondylosis. CT ABDOMEN PELVIS FINDINGS Hepatobiliary: Normal liver with no liver mass. Normal gallbladder with no radiopaque cholelithiasis. No biliary ductal dilatation. Pancreas: Normal, with no mass or duct dilation. Spleen: Normal size. No mass. Adrenals/Urinary Tract: Normal adrenals. Normal kidneys with no hydronephrosis and no renal mass. Normal bladder. Stomach/Bowel: Small hiatal hernia. Otherwise normal nondistended stomach. Normal caliber small bowel with no small bowel wall thickening. Normal appendix. No definite large bowel wall thickening. No diverticulosis. No acute pericolonic fat stranding. Oral contrast transits to the rectum. Vascular/Lymphatic: Normal caliber abdominal aorta. Patent portal, splenic, hepatic and renal veins. No pathologically enlarged lymph nodes in the abdomen or pelvis. Reproductive: Interval hysterectomy and bilateral salpingo-oophorectomy. No adnexal masses. Left pelvic sidewall 6.3 x 5.0 cm simple fluid collection (series 2/image 99). Other: No pneumoperitoneum. No ascites. Vertical subcutaneous laparotomy scar with no superficial fluid collections. Musculoskeletal: No aggressive appearing focal osseous lesions. Stable L3 and L5 vertebral hemangiomas. Mild lumbar spondylosis. IMPRESSION: 1. No evidence of residual/recurrent tumor in the pelvis. 2. No evidence of metastatic disease in the chest, abdomen or pelvis. 3. Left pelvic sidewall 6.3 x 5.0 cm simple fluid collection, compatible with postsurgical seroma or lymphocele. 4. Small hiatal hernia. 5.  Aortic Atherosclerosis (ICD10-I70.0). Electronically Signed   By: Ilona Sorrel M.D.   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 Staging evaluation. EXAM: CT CHEST, ABDOMEN, AND PELVIS WITH CONTRAST TECHNIQUE: Multidetector CT imaging of the chest, abdomen and pelvis was performed following the standard protocol during bolus administration of intravenous contrast. CONTRAST:  116mL OMNIPAQUE IOHEXOL 300 MG/ML  SOLN COMPARISON:  05/15/2018 MRI pelvis.  04/29/2018 CT abdomen/pelvis. FINDINGS: CT CHEST FINDINGS Cardiovascular: Normal heart size. No significant pericardial effusion/thickening. Right internal jugular MediPort terminates at the cavoatrial junction. Minimal atherosclerotic nonaneurysmal thoracic aorta. Normal caliber pulmonary arteries. No central pulmonary emboli. Mediastinum/Nodes: Dominant hypodense 1.3 cm right thyroid lobe nodule. Unremarkable esophagus. No pathologically enlarged axillary, mediastinal or hilar lymph nodes. Lungs/Pleura: No pneumothorax. No pleural effusion. No acute consolidative airspace disease, lung masses or significant pulmonary nodules. Musculoskeletal: No aggressive appearing focal osseous lesions. Minimal thoracic spondylosis. CT ABDOMEN PELVIS FINDINGS Hepatobiliary: Normal liver with no liver mass. Normal gallbladder with no radiopaque cholelithiasis. No biliary ductal dilatation. Pancreas: Normal, with no mass or duct dilation. Spleen: Normal size. No mass. Adrenals/Urinary Tract: Normal adrenals. Normal kidneys with no hydronephrosis and no renal mass. Normal bladder. Stomach/Bowel: Small hiatal hernia. Otherwise normal nondistended stomach. Normal caliber small bowel with no small bowel wall thickening. Normal appendix. No definite large bowel wall thickening. No diverticulosis. No acute pericolonic fat stranding. Oral contrast transits to the rectum. Vascular/Lymphatic: Normal caliber  abdominal aorta. Patent portal, splenic,  hepatic and renal veins. No pathologically enlarged lymph nodes in the abdomen or pelvis. Reproductive: Interval hysterectomy and bilateral salpingo-oophorectomy. No adnexal masses. Left pelvic sidewall 6.3 x 5.0 cm simple fluid collection (series 2/image 99). Other: No pneumoperitoneum. No ascites. Vertical subcutaneous laparotomy scar with no superficial fluid collections. Musculoskeletal: No aggressive appearing focal osseous lesions. Stable L3 and L5 vertebral hemangiomas. Mild lumbar spondylosis. IMPRESSION: 1. No evidence of residual/recurrent tumor in the pelvis. 2. No evidence of metastatic disease in the chest, abdomen or pelvis. 3. Left pelvic sidewall 6.3 x 5.0 cm simple fluid collection, compatible with postsurgical seroma or lymphocele. 4. Small hiatal hernia. 5.  Aortic Atherosclerosis (ICD10-I70.0). Electronically Signed   By: Ilona Sorrel M.D.   On: 06/28/2018 23:30   Ir Cv Line Injection  Result Date: 07/15/2018 INDICATION: Neck swelling EXAM: PORT-A-CATH INJECTION MEDICATIONS: None ANESTHESIA/SEDATION: None FLUOROSCOPY TIME:  Fluoroscopy Time:  minutes 48 seconds (8.9 mGy). COMPLICATIONS: None immediate. PROCEDURE: Informed written consent was obtained from the patient after a thorough discussion of the procedural risks, benefits and alternatives. All questions were addressed. Maximal Sterile Barrier Technique was utilized including caps, mask, sterile gowns, sterile gloves, sterile drape, hand hygiene and skin antiseptic. A timeout was performed prior to the initiation of the procedure. Utilizing sterile technique, a uterine needle was advanced into the right jugular vein Port-A-Cath reservoir. Contrast was injected. It was then flushed with heparinized saline. FINDINGS: The port is functioning normally. Contrast injected fills the reservoir and exits the tip of the catheter. The tip is at the cavoatrial junction. IMPRESSION: Right jugular vein Port-A-Cath is patent without evidence of  extravasation to suggest complication or leak. Tip is at the cavoatrial junction. Electronically Signed   By: Marybelle Killings M.D.   On: 07/15/2018 14:30   Vas [place] Upper Extremity Venous Duplex  Result Date: 07/15/2018 UPPER VENOUS STUDY  Indications: Swelling Performing Technologist: Abram Sander  Examination Guidelines: A complete evaluation includes B-mode imaging, spectral Doppler, color Doppler, and power Doppler as needed of all accessible portions of each vessel. Bilateral testing is considered an integral part of a complete examination. Limited examinations for reoccurring indications may be performed as noted.  Right Findings: +----------+------------+----------+---------+-----------+--------------+ RIGHT     CompressiblePropertiesPhasicitySpontaneous   Summary     +----------+------------+----------+---------+-----------+--------------+ IJV           None                 No        No         Acute      +----------+------------+----------+---------+-----------+--------------+ Subclavian    Full                 Yes       Yes                   +----------+------------+----------+---------+-----------+--------------+ Axillary      Full                 Yes       Yes                   +----------+------------+----------+---------+-----------+--------------+ Brachial      Full                 Yes       Yes                   +----------+------------+----------+---------+-----------+--------------+ Radial  Full                                                 +----------+------------+----------+---------+-----------+--------------+ Ulnar         Full                                                 +----------+------------+----------+---------+-----------+--------------+ Cephalic                                            not visualized +----------+------------+----------+---------+-----------+--------------+ Basilic       Full                                                  +----------+------------+----------+---------+-----------+--------------+  Left Findings: +----------+------------+----------+---------+-----------+-------+ LEFT      CompressiblePropertiesPhasicitySpontaneousSummary +----------+------------+----------+---------+-----------+-------+ Subclavian                         Yes       Yes            +----------+------------+----------+---------+-----------+-------+  Summary:  Right: Findings consistent with acute deep vein thrombosis involving the right internal jugular veins.  Left: No evidence of thrombosis in the subclavian.  *See table(s) above for measurements and observations.  Diagnosing physician: Harold Barban MD Electronically signed by Harold Barban MD on 07/15/2018 at 6:55:00 PM.    Final         This case was discussed with Dr. Benay Spice. He expressed agreement with my management of this patient.

## 2018-07-20 NOTE — Progress Notes (Signed)
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 Large pelvic mass with heterogeneous appearance, measuring at least 15 centimeters in diameter. Although the findings favor enlarged uterus/fibroids, ovaries are not well seen and an ovarian mass should also be considered. Further characterization with pelvic ultrasound is recommended. 2. The bladder is completely decompressed. There is LEFT-sided hydronephrosis secondary to extrinsic compression from the pelvic mass. 3. Irregular thickening of the sigmoid colon, suspicious for colonic wall mass, measuring 1.3 centimeters. In this patient with a family history of colon cancer, further evaluation with colonoscopy is recommended. 4. Colonic diverticula without acute diverticulitis. 5. Moderate hiatal hernia.     05/15/2018 Imaging    MRI pelvis 16 cm complex cystic and solid mass which is centered in the left posterior adnexa and is contiguous with the uterus. This is suspicious for ovarian carcinoma, with differential diagnosis of atypical pedunculated fibroid with peripheral cystic degeneration. Surgical evaluation is recommended.  Other uterine fibroids measuring up to 4.8 cm.  No evidence of pelvic metastatic disease.     05/16/2018 Imaging    US pelvis 1. Large pelvic mass described in detail on MRI examination 1 day prior.  2. Slight ureteral prominence bilaterally. Mild fullness of the left renal collecting system. No obstructing focus evident. Suspect fullness of these  structures due to ureteral compression by the sizable pelvic mass.  3.  Study otherwise unremarkable.     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, with necrosis  - Carcinoma is adherent to fallopian tube, consistent with surface involvement (stage pT1c2) - Focal endometriosis present - See synoptic report and comment  B: Uterus with cervix and right ovary and fallopian tube, hysterectomy and right salpingo-oophorectomy Cervix: Benign with Nabothian cysts Endometrium: Endometrial polyp (size 3.5 cm); inactive endometrium with cystic glandular changes Myometrium: Leiomyomata with hyalinization and calcification (size up to 4.2 cm); focal adenomyosis  Right ovary: Benign physiologic changes Right fallopian tube: Small paratubal cyst and no malignancy identified  C: Lymph nodes, right pelvic, regional resection - Nine lymph nodes with no metastatic carcinoma identified (0/9)  D: Lymph nodes, left pelvic, regional resection - Five lymph nodes with no metastatic carcinoma identified (0/5)  E: Cul-de-sac, anterior, biopsy - Fibroadipose tissue with no carcinoma identified  F: Cul-de-sac, posterior, biopsy - Fibrous tissue with inflammation and no carcinoma identified  G: Pelvic sidewall, right, biopsy - Fibroadipose tissue with focal crushed CD10 positive cells, cannot exclude endometrial type stroma - No carcinoma identified  H: Pelvic sidewall, left, biopsy - Fibroadipose tissue with focal crushed cells, cannot exclude endometrial type stroma - No carcinoma identified  I: Omentum, omentectomy - Adipose and fibrovascular tissue consistent with omentum - No carcinoma identified  J: Lymph nodes, left periaortic, regional resection - Two small  lymph nodes with no metastatic carcinoma identified (0/2)  K: Lymph nodes, right periaortic, regional resection - Three small lymph nodes with no metastatic carcinoma identified (0/3)  This electronic signature is attestation that the pathologist personally reviewed the submitted material(s) and the final diagnosis reflects that evaluation.    Synoptic Report     OVARY or FALLOPIAN TUBE or PRIMARY PERITONEUM(Ovary FT Perit - All Specimens)    SPECIMEN  Procedure:Total hysterectomy and bilateral salpingo-oophorectomy   Procedure:Omentectomy   Procedure:Peritonealbiopsies   Procedure:Peritoneal washing   :  Specimen Integrity of Left Ovary:Received largely intact with multiple areas of disruption   Tumor Site:Left ovary   Histologic Type:Clear cell carcinoma   Histologic Grade:High grade   :  Tumor Size:Greatest dimension in Centimeters (cm): 14.0 Centimeters (cm)  Additional Dimension in Centimeters (cm):10 Centimeters (cm)  Additional Dimension in Centimeters (cm):7 Centimeters (cm)  Tumor Extent:  Ovarian Surface Involvement:Present   Laterality:Left   Other Tissue / Organ Involvement:Not identified   Peritoneal Ascitic Fluid:Negative for malignancy (normal / benign)   Pleural Fluid:Not submitted / unknown   Accessory Findings:  LYMPH NODES  Regional Lymph Nodes:All lymph nodes negative for tumor cells   Number of Lymph Nodes Examined:19   Site(s):Right pelvic: 9   Site(s):Left pelvic: 5   Site(s):Right para-aortic: 3   Site(s):Left para-aortic: 2   PATHOLOGIC STAGE CLASSIFICATION (pTNM, AJCC 8th Edition)  Primary Tumor (pT):pT1c2   Regional Lymph Nodes (pN):pN0   FIGO STAGE  FIGO Stage:IC2   ADDITIONAL FINDINGS  Additional Pathologic Findings:Endometriosis   Additional Pathologic Findings:Benign  fallopian tube with adherence to carcinoma; also see additional parts   Comment(s)  Comment(s):Also see diagnosis comment.   The frozen section diagnosis is confirmed for specimen A. Sections demonstrate a carcinoma with tubulocystic and papillary architecture with hyalinized cores, lined by cells with high grade nuclei, clear to eosinophilic cytoplasm, and nuclear hobnailing.  The morphologic features are most suggestive of clear cell carcinoma.  Immunohistochemical stains are performed, and demonstrate that the carcinoma is positive for CK7 and PAX8, consistent with gynecologic origin. It has patchy positivity for Napsin A, wild type p53 staining, and is negative for ER, supporting the diagnosis of clear cell carcinoma.   Sections of the tumor demonstrate carcinoma with adherence to fallopian tube surface, consistent with surface involvement.  Per the operative note, findings were also suggestive of preoperative rupture of the mass.  Given both these findings, the tumor is staged as a pT1c2 (FIGO IC2)             05/27/2018 Surgery    Preoperative Diagnoses: Pelvic mass  Postoperative Diagnoses: left fallopian tube and ovary consistent with high-grade carcinoma of likely GYN origin. No evidence of metastatic disease.  Procedures: Exploratory laparotomy, total abdominal hysterectomy, bilateral salpingo-oophorectomy, peritoneal biopsies for ovarian cancer staging, bilateral pelvic lymphadenectomy, bilateral para-aortic lymphadenectomy, infracolic omentectomy, R0 resection   Surgeon: Marti Sleigh, MD  Assistants: Tona Sensing, MD - Fellow, Haroldine Laws, MD - Resident, Feliberto Harts, PA-S  Findings: On BME, the cervix is retracted anteriorly with no palpable adnexal fullness. There is no RV septum nodularity. Abdominal exam reveals a mobile large mass that extends to the umbilicus and nearly to the pelvic side walls. On abdominal entry, there was immediate green-brown pelvic  fluid that appeared consistent with a spontaneous rupture of the cystic fluid-filled mass. The left ovary was a large cystic complex mass, roughly 15cm in size with smooth surface but containing friable tumor. The mass was densely adherent along  the entire posterior wall of the uterus. The uterus had some small intramural fibroids and was roughly 10cm in total size. The right fallopian tube and ovary were normal appearing. The patient has smooth paracolic gutters, liver edge, diaphragm, spleen and stomach. The omentum was retracted but no evidence of nodularity or gross tumor. The anterior cul-de-sac was clear and the posterior-cul-de-sac peritoneum was clear, but the left ovarian mass wall was adherent to the rectosigmoid and cul-de-sac. There were no palpable pelvic or para-aortic adenopathy.   Of note, an the conclusion of the case, there was mild RIGHT hydronephrosis noted. The ureter was traced from the level of the right common iliac into the anterior utero-vesical ligament. There was an area at the pelvic brim were the hydronephrosis resolved to normal caliber. There was no tethering, stricture, or apparent injury to the ureter. Based on exam, this seemed consistent with packing and retraction causing some transient dilation that resolved with packing and retractor removal.   Intraoperative frozen section of the left ovary was consistent with high-grade carcinoma of GYN origin, possibly serous. There was no gross evidence of disease at the conclusion of the case, R0 resection.  Specimens: 1) Pelvic washings 2) Uterus with cervix  3) Left ovary and fallopian tube, IOFS c/w high-grade GYN carcinoma 4) Right fallopian tube and ovary 5) Peritoneal biopsies - anterior cul-de-sac peritoneum, posterior cul-de-sac peritoneum, left pelvic side wall peritoneum, right pelvic side wall peritoneum 6) Right and left pelvic lymph nodes 7) Right and left para-aortic lymph nodes 8) Omentum 9) Anaerobic/Aerobic  culture - preliminary read is no organisms     06/04/2018 Procedure    She had port placement    06/05/2018 Cancer Staging    Staging form: Ovary, Fallopian Tube, and Primary Peritoneal Carcinoma, AJCC 8th Edition - Pathologic: FIGO Stage IC2, calculated as Stage IC (pT1c2, pN0, cM0) - Signed by Heath Lark, MD on 06/05/2018    06/27/2018 Imaging    1. No evidence of residual/recurrent tumor in the pelvis. 2. No evidence of metastatic disease in the chest, abdomen or pelvis. 3. Left pelvic sidewall 6.3 x 5.0 cm simple fluid collection, compatible with postsurgical seroma or lymphocele. 4. Small hiatal hernia. 5. Aortic Atherosclerosis (ICD10-I70.0).    06/27/2018 Tumor Marker    Patient's tumor was tested for the following markers: CA-125 Results of the tumor marker test revealed 90.3    CURRENT THERAPY: Adjuvant chemotherapy carboplatin AUC 6 to 175 mg/m every 3 weeks   INTERVAL HISTORY: Melissa Simpson is a 67 y.o. female who is here for follow-up. I see her in Dr. Calton Dach absence. She was seen by PA Sandi Mealy in the interim when she was noted to have left neck swelling.  Today, she is here alone. She is doing well. She states hat her BP went up and she felt cold and was shivering with last cycle. She denies having CP at that time. She takes her nausea medications as needed. She also experiences constipation and headaches after treatment. She denies numbness and tingling. She sometimes experiences sharp shooting pains in her abdomen at the surgical site.  She says that she noticed a painful swelling on the right side of her neck, which caused right ear pain last week. This has now improved.   REVIEW OF SYSTEMS:   Constitutional: Denies fevers, chills or abnormal weight loss Eyes: Denies blurriness of vision Ears, nose, mouth, throat, and face: Denies mucositis or sore throat (+) right sided neck swelling and pain, improved Respiratory:  Denies cough, dyspnea or  wheezes Cardiovascular: Denies palpitation, chest discomfort or lower extremity swelling Gastrointestinal:  Denies nausea, heartburn (+) constipation after treatment (+) shooting abdominal pain at surgical site Skin: Denies abnormal skin rashes Lymphatics: Denies new lymphadenopathy or easy bruising Neurological:Denies numbness, tingling or new weaknesses Behavioral/Psych: Mood is stable, no new changes  All other systems were reviewed with the patient and are negative.  MEDICAL HISTORY:  Past Medical History:  Diagnosis Date  . Cancer ([place])    left ovarian cancer  . Family history of colon cancer   . Family history of ovarian cancer   . GERD (gastroesophageal reflux disease)   . Hyperlipidemia   . Hypertension     SURGICAL HISTORY: Past Surgical History:  Procedure Laterality Date  . ABDOMINAL HYSTERECTOMY    . BIOPSY THYROID  2008  . COLONOSCOPY    . IR CV LINE INJECTION  07/15/2018  . TONSILECTOMY, ADENOIDECTOMY, BILATERAL MYRINGOTOMY AND TUBES Bilateral 1957    I have reviewed the social history and family history with the patient and they are unchanged from previous note.  ALLERGIES:  has No Known Allergies.  MEDICATIONS:  Current Outpatient Medications  Medication Sig Dispense Refill  . acetaminophen (TYLENOL) 325 MG tablet Take 650 mg by mouth every 6 (six) hours as needed.    Marland Kitchen alendronate (FOSAMAX) 70 MG tablet TAKE 1 TABLET EVERY WEEK 12 tablet 3  . dexamethasone (DECADRON) 4 MG tablet Take 5 tabs at the night before and 5 tab the morning of chemotherapy, every 3 weeks, by mouth 60 tablet 0  . lidocaine-prilocaine (EMLA) cream Apply to affected area once 30 g 3  . losartan-hydrochlorothiazide (HYZAAR) 100-12.5 MG tablet Take 1 tablet by mouth daily. 90 tablet 3  . metoprolol tartrate (LOPRESSOR) 50 MG tablet TAKE 1 TABLET TWICE DAILY 180 tablet 1  . Multiple Vitamin (MULTIVITAMIN) tablet Take 1 tablet by mouth 2 (two) times daily.     Marland Kitchen omeprazole (PRILOSEC) 20  MG capsule Take 1 capsule (20 mg total) by mouth daily. 90 capsule 3  . ondansetron (ZOFRAN) 8 MG tablet Take 1 tablet (8 mg total) by mouth every 8 (eight) hours as needed for refractory nausea / vomiting. Start on day 3 after chemo. 30 tablet 1  . prochlorperazine (COMPAZINE) 10 MG tablet TAKE 1 TABLET(10 MG) BY MOUTH EVERY 6 HOURS AS NEEDED FOR NAUSEA OR VOMITING 385 tablet 0  . Rivaroxaban (XARELTO STARTER PACK) 15 & 20 MG TBPK Take as directed on package: Start with one 37m tablet by mouth bid a day with food. On Day 22, switch to one 277mtablet daily with food. 51 each 0  . simvastatin (ZOCOR) 20 MG tablet Take 1 tablet (20 mg total) by mouth daily. 90 tablet 3  . triamcinolone (NASACORT) 55 MCG/ACT AERO nasal inhaler Place 2 sprays into the nose 2 (two) times daily. 1 Inhaler 12   No current facility-administered medications for this visit.     PHYSICAL EXAMINATION: ECOG PERFORMANCE STATUS: 1 - Symptomatic but completely ambulatory Weight 130.8 pounds, blood pressure 164/88, pulse 70, respirate 18, temperature 99.0, pulse ox 95% on room air GENERAL:alert, no distress and comfortable SKIN: skin color, texture, turgor are normal, no rashes or significant lesions EYES: normal, Conjunctiva are pink and non-injected, sclera clear OROPHARYNX:no exudate, no erythema and lips, buccal mucosa, and tongue normal (+) right neck tender swelling NECK: supple, thyroid normal size, non-tender, without nodularity LYMPH:  no palpable lymphadenopathy in the cervical, axillary or inguinal LUNGS:  clear to auscultation and percussion with normal breathing effort HEART: regular rate & rhythm and no murmurs and no lower extremity edema ABDOMEN:abdomen soft, non-tender and normal bowel sounds (+) surgical site healed well  Musculoskeletal:no cyanosis of digits and no clubbing  NEURO: alert & oriented x 3 with fluent speech, no focal motor/sensory deficits  LABORATORY DATA:  I have reviewed the data as  listed CBC Latest Ref Rng & Units 07/21/2018 06/30/2018 06/27/2018  WBC 4.0 - 10.5 K/uL 6.9 8.1 6.9  Hemoglobin 12.0 - 15.0 g/dL 12.0 13.2 13.1  Hematocrit 36.0 - 46.0 % 36.2 40.6 40.0  Platelets 150 - 400 K/uL 342 296 313     CMP Latest Ref Rng & Units 07/21/2018 06/30/2018 06/27/2018  Glucose 70 - 99 mg/dL 115(H) 82 102(H)  BUN 8 - 23 mg/dL _0 Creatinine 0.44 - 1.00 mg/dL 0.88 1.10(H) 1.07(H)  Sodium 135 - 145 mmol/L 140 141 142  Potassium 3.5 - 5.1 mmol/L 4.1 4.3 4.0  Chloride 98 - 111 mmol/L 106 103 106  CO2 22 - 32 mmol/L _1 Calcium 8.9 - 10.3 mg/dL 10.0 9.9 9.7  Total Protein 6.5 - 8.1 g/dL 7.4 7.5 7.3  Total Bilirubin 0.3 - 1.2 mg/dL 0.3 0.7 0.7  Alkaline Phos 38 - 126 U/L 94 39 42  AST 15 - 41 U/L _2 ALT 0 - 44 U/L 51(H) 18 16      RADIOGRAPHIC STUDIES: I have personally reviewed the radiological images as listed and agreed with the findings in the report. No results found.   ASSESSMENT & PLAN:  Melissa Simpson is a 67 y.o. female with history of   1. Left ovarian epithelial cancer, stage IC -Reviewed her chart extensively, including her recent surgical pathology and image findings. -She has started adjuvant chemotherapy carbo and Taxol, tolerated first cycle well. -she is using the Dignicap Device to preserve her hair --lab reviewed, CBC is overall WNLs. CMP and CA 125 pending. Good to proceed with chemo tomorrow.  -I discussed cryotherapy for her hands and foot to prevent neuropathy prevention. She is reductant to do it, she has no neuropathy now. She will think about it.  -RTC in 3 weeks to f/u with Dr. Alvy Bimler before cycle 3 chemo   2. Lymphocele after surgical procedure - She has mild left groin discomfort likely due to lymphocele seen on CT imaging -overall improved   3. Right IJ thrombosis  -possible related to port and chemo -Her right neck swelling and pain has much improved  -continue Xarelto, minimum 3 months, until she  completes chemo   Plan -Labs reviewed, good to proceed with cycle 2 chemo treatment tomorrow with same dose  -f/u in 3 weeks with Dr. Alvy Bimler    No problem-specific Assessment & Plan notes found for this encounter.   No orders of the defined types were placed in this encounter.  All questions were answered. The patient knows to call the clinic with any problems, questions or concerns. No barriers to learning was detected. I spent 25 minutes counseling the patient face to face. The total time spent in the appointment was 30 minutes and more than 50% was on counseling and review of test results  I, Noor Dweik am acting as scribe for Dr. Truitt Merle.  I have reviewed the above documentation for accuracy and completeness, and I agree with the above.      Truitt Merle, MD 07/21/2018

## 2018-07-21 ENCOUNTER — Inpatient Hospital Stay: Payer: Medicare HMO

## 2018-07-21 ENCOUNTER — Ambulatory Visit: Payer: Self-pay | Admitting: Genetics

## 2018-07-21 ENCOUNTER — Encounter: Payer: Self-pay | Admitting: Genetics

## 2018-07-21 ENCOUNTER — Encounter: Payer: Self-pay | Admitting: Hematology

## 2018-07-21 ENCOUNTER — Inpatient Hospital Stay (HOSPITAL_BASED_OUTPATIENT_CLINIC_OR_DEPARTMENT_OTHER): Payer: Medicare HMO | Admitting: Hematology

## 2018-07-21 DIAGNOSIS — Z800 Family history of malignant neoplasm of digestive organs: Secondary | ICD-10-CM

## 2018-07-21 DIAGNOSIS — Z7901 Long term (current) use of anticoagulants: Secondary | ICD-10-CM

## 2018-07-21 DIAGNOSIS — E785 Hyperlipidemia, unspecified: Secondary | ICD-10-CM | POA: Diagnosis not present

## 2018-07-21 DIAGNOSIS — Z5111 Encounter for antineoplastic chemotherapy: Secondary | ICD-10-CM | POA: Diagnosis not present

## 2018-07-21 DIAGNOSIS — C562 Malignant neoplasm of left ovary: Secondary | ICD-10-CM | POA: Diagnosis not present

## 2018-07-21 DIAGNOSIS — I10 Essential (primary) hypertension: Secondary | ICD-10-CM

## 2018-07-21 DIAGNOSIS — Z79899 Other long term (current) drug therapy: Secondary | ICD-10-CM | POA: Diagnosis not present

## 2018-07-21 DIAGNOSIS — E041 Nontoxic single thyroid nodule: Secondary | ICD-10-CM | POA: Diagnosis not present

## 2018-07-21 DIAGNOSIS — Z1379 Encounter for other screening for genetic and chromosomal anomalies: Secondary | ICD-10-CM | POA: Insufficient documentation

## 2018-07-21 DIAGNOSIS — I82C11 Acute embolism and thrombosis of right internal jugular vein: Secondary | ICD-10-CM

## 2018-07-21 DIAGNOSIS — M542 Cervicalgia: Secondary | ICD-10-CM | POA: Diagnosis not present

## 2018-07-21 DIAGNOSIS — R221 Localized swelling, mass and lump, neck: Secondary | ICD-10-CM | POA: Diagnosis not present

## 2018-07-21 DIAGNOSIS — Z8041 Family history of malignant neoplasm of ovary: Secondary | ICD-10-CM

## 2018-07-21 LAB — COMPREHENSIVE METABOLIC PANEL
ALBUMIN: 3.2 g/dL — AB (ref 3.5–5.0)
ALK PHOS: 94 U/L (ref 38–126)
ALT: 51 U/L — ABNORMAL HIGH (ref 0–44)
ANION GAP: 8 (ref 5–15)
AST: 26 U/L (ref 15–41)
BILIRUBIN TOTAL: 0.3 mg/dL (ref 0.3–1.2)
BUN: 15 mg/dL (ref 8–23)
CALCIUM: 10 mg/dL (ref 8.9–10.3)
CO2: 26 mmol/L (ref 22–32)
Chloride: 106 mmol/L (ref 98–111)
Creatinine, Ser: 0.88 mg/dL (ref 0.44–1.00)
GFR calc Af Amer: >60 mL/min (ref >60)
GFR calc non Af Amer: >60 mL/min (ref >60)
Glucose, Bld: 115 mg/dL — ABNORMAL HIGH (ref 70–99)
POTASSIUM: 4.1 mmol/L (ref 3.5–5.1)
SODIUM: 140 mmol/L (ref 135–145)
TOTAL PROTEIN: 7.4 g/dL (ref 6.5–8.1)

## 2018-07-21 LAB — CBC WITH DIFFERENTIAL/PLATELET
Abs Immature Granulocytes: 0.08 10*3/uL — ABNORMAL HIGH (ref 0.00–0.07)
BASOS ABS: 0.1 10*3/uL (ref 0.0–0.1)
Basophils Relative: 1 %
EOS ABS: 0.1 10*3/uL (ref 0.0–0.5)
EOS PCT: 2 %
HCT: 36.2 % (ref 36.0–46.0)
HEMOGLOBIN: 12 g/dL (ref 12.0–15.0)
Immature Granulocytes: 1 %
LYMPHS PCT: 25 %
Lymphs Abs: 1.7 10*3/uL (ref 0.7–4.0)
MCH: 31.1 pg (ref 26.0–34.0)
MCHC: 33.1 g/dL (ref 30.0–36.0)
MCV: 93.8 fL (ref 80.0–100.0)
MONO ABS: 0.8 10*3/uL (ref 0.1–1.0)
Monocytes Relative: 11 %
Neutro Abs: 4.2 10*3/uL (ref 1.7–7.7)
Neutrophils Relative %: 60 %
Platelets: 342 10*3/uL (ref 150–400)
RBC: 3.86 MIL/uL — AB (ref 3.87–5.11)
RDW: 15.4 % (ref 11.5–15.5)
WBC: 6.9 10*3/uL (ref 4.0–10.5)
nRBC: 0 % (ref 0.0–0.2)

## 2018-07-21 MED ORDER — SODIUM CHLORIDE 0.9% FLUSH
10.0000 mL | Freq: Once | INTRAVENOUS | Status: AC
  Administered 2018-07-21: 10 mL
  Filled 2018-07-21: qty 10

## 2018-07-21 MED ORDER — HEPARIN SOD (PORK) LOCK FLUSH 100 UNIT/ML IV SOLN
500.0000 [IU] | Freq: Once | INTRAVENOUS | Status: AC
  Administered 2018-07-21: 500 [IU]
  Filled 2018-07-21: qty 5

## 2018-07-21 NOTE — Progress Notes (Signed)
HPI:  Ms. Parkin was previously seen in the [facility] on 06/30/2018 due to a personal and family history of cancer and concerns regarding a hereditary predisposition to cancer. Please refer to our prior cancer genetics clinic note for more information regarding Ms. Brahm's medical, social and family histories, and our assessment and recommendations, at the time. Ms. Namba recent genetic test results were disclosed to her, as well as recommendations warranted by these results. These results and recommendations are discussed in more detail below.  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 Large pelvic mass with heterogeneous appearance, measuring at least 15 centimeters in diameter. Although the findings favor enlarged uterus/fibroids, ovaries are not well seen and an ovarian mass should also be considered. Further characterization with pelvic ultrasound is recommended. 2. The bladder is completely decompressed. There is LEFT-sided hydronephrosis secondary to extrinsic compression from the pelvic mass. 3. Irregular thickening of the sigmoid colon, suspicious for colonic wall mass, measuring 1.3 centimeters. In this patient with a family history of colon cancer, further evaluation with colonoscopy is recommended. 4. Colonic diverticula without acute diverticulitis. 5. Moderate hiatal hernia.     05/15/2018 Imaging    MRI pelvis 16 cm complex cystic and solid mass which is centered in the left posterior adnexa and is contiguous with the uterus. This is suspicious for ovarian carcinoma, with differential diagnosis of atypical pedunculated fibroid with peripheral cystic degeneration. Surgical evaluation is recommended.  Other uterine fibroids measuring up to 4.8  cm.  No evidence of pelvic metastatic disease.     05/16/2018 Imaging    US pelvis 1. Large pelvic mass described in detail on MRI examination 1 day prior.  2. Slight ureteral prominence bilaterally. Mild fullness of the left renal collecting system. No obstructing focus evident. Suspect fullness of these structures due to ureteral compression by the sizable pelvic mass.  3.  Study otherwise unremarkable.     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, with necrosis  - Carcinoma is adherent to fallopian tube, consistent with surface involvement (stage pT1c2) - Focal endometriosis present - See synoptic report and comment  B: Uterus with cervix and right ovary and fallopian tube, hysterectomy and right salpingo-oophorectomy Cervix: Benign with Nabothian cysts Endometrium: Endometrial polyp (size 3.5 cm); inactive endometrium with cystic glandular changes Myometrium: Leiomyomata with hyalinization and calcification (size up to 4.2 cm); focal adenomyosis  Right ovary: Benign physiologic changes Right fallopian tube: Small paratubal cyst and no malignancy identified  C: Lymph nodes, right pelvic, regional resection - Nine lymph nodes with no metastatic carcinoma identified (0/9)  D: Lymph nodes, left pelvic, regional resection - Five lymph nodes with no metastatic carcinoma identified (0/5)  E: Cul-de-sac, anterior, biopsy - Fibroadipose tissue with no carcinoma identified  F: Cul-de-sac, posterior, biopsy - Fibrous tissue with inflammation and no carcinoma identified  G: Pelvic sidewall, right, biopsy - Fibroadipose tissue with focal crushed CD10 positive cells, cannot exclude endometrial type stroma - No carcinoma identified  H: Pelvic  sidewall, left, biopsy - Fibroadipose tissue with focal crushed cells, cannot exclude endometrial type stroma - No carcinoma identified  I: Omentum, omentectomy - Adipose and fibrovascular tissue consistent with omentum - No carcinoma identified  J: Lymph nodes, left periaortic, regional resection - Two small lymph nodes with no metastatic carcinoma identified (0/2)  K: Lymph nodes, right periaortic, regional resection - Three small lymph nodes with no metastatic carcinoma identified (0/3)  This electronic signature is attestation that the pathologist personally reviewed the submitted material(s) and the final diagnosis reflects that evaluation.    Synoptic Report     OVARY or FALLOPIAN TUBE or PRIMARY PERITONEUM(Ovary FT Perit - All Specimens)    SPECIMEN  Procedure:Total hysterectomy and bilateral salpingo-oophorectomy   Procedure:Omentectomy   Procedure:Peritonealbiopsies   Procedure:Peritoneal washing   :  Specimen Integrity of Left Ovary:Received largely intact with multiple areas of disruption   Tumor Site:Left ovary   Histologic Type:Clear cell carcinoma   Histologic Grade:High grade   :  Tumor Size:Greatest dimension in Centimeters (cm): 14.0 Centimeters (cm)  Additional Dimension in Centimeters (cm):10 Centimeters (cm)  Additional Dimension in Centimeters (cm):7 Centimeters (cm)  Tumor Extent:  Ovarian Surface Involvement:Present   Laterality:Left   Other Tissue / Organ Involvement:Not identified   Peritoneal Ascitic Fluid:Negative for malignancy (normal / benign)   Pleural Fluid:Not submitted / unknown   Accessory Findings:  LYMPH NODES  Regional Lymph Nodes:All lymph nodes negative for tumor cells   Number of Lymph Nodes Examined:19   Site(s):Right pelvic: 9   Site(s):Left pelvic: 5   Site(s):Right para-aortic: 3    Site(s):Left para-aortic: 2   PATHOLOGIC STAGE CLASSIFICATION (pTNM, AJCC 8th Edition)  Primary Tumor (pT):pT1c2   Regional Lymph Nodes (pN):pN0   FIGO STAGE  FIGO Stage:IC2   ADDITIONAL FINDINGS  Additional Pathologic Findings:Endometriosis   Additional Pathologic Findings:Benign fallopian tube with adherence to carcinoma; also see additional parts   Comment(s)  Comment(s):Also see diagnosis comment.   The frozen section diagnosis is confirmed for specimen A. Sections demonstrate a carcinoma with tubulocystic and papillary architecture with hyalinized cores, lined by cells with high grade nuclei, clear to eosinophilic cytoplasm, and nuclear hobnailing.  The morphologic features are most suggestive of clear cell carcinoma.  Immunohistochemical stains are performed, and demonstrate that the carcinoma is positive for CK7 and PAX8, consistent with gynecologic origin. It has patchy positivity for Napsin A, wild type p53 staining, and is negative for ER, supporting the diagnosis of clear cell carcinoma.   Sections of the tumor demonstrate carcinoma with adherence to fallopian tube surface, consistent with surface involvement.  Per the operative note, findings were also suggestive of preoperative rupture of the mass.  Given both these findings, the tumor is staged as a pT1c2 (FIGO IC2)             05/27/2018 Surgery    Preoperative Diagnoses: Pelvic mass  Postoperative Diagnoses: left fallopian tube and ovary consistent with high-grade carcinoma of likely GYN origin. No evidence of metastatic disease.  Procedures: Exploratory laparotomy, total abdominal hysterectomy, bilateral salpingo-oophorectomy, peritoneal biopsies for ovarian cancer staging, bilateral pelvic lymphadenectomy, bilateral para-aortic lymphadenectomy, infracolic omentectomy, R0 resection   Surgeon: Marti Sleigh, MD  Assistants: Tona Sensing, MD - Fellow, Haroldine Laws, MD -  Resident, Feliberto Harts, PA-S  Findings: On BME, the cervix is retracted anteriorly with no palpable adnexal fullness. There is no RV septum nodularity. Abdominal exam reveals a mobile large mass that extends to the umbilicus and nearly to the pelvic side walls. On  abdominal entry, there was immediate green-brown pelvic fluid that appeared consistent with a spontaneous rupture of the cystic fluid-filled mass. The left ovary was a large cystic complex mass, roughly 15cm in size with smooth surface but containing friable tumor. The mass was densely adherent along the entire posterior wall of the uterus. The uterus had some small intramural fibroids and was roughly 10cm in total size. The right fallopian tube and ovary were normal appearing. The patient has smooth paracolic gutters, liver edge, diaphragm, spleen and stomach. The omentum was retracted but no evidence of nodularity or gross tumor. The anterior cul-de-sac was clear and the posterior-cul-de-sac peritoneum was clear, but the left ovarian mass wall was adherent to the rectosigmoid and cul-de-sac. There were no palpable pelvic or para-aortic adenopathy.   Of note, an the conclusion of the case, there was mild RIGHT hydronephrosis noted. The ureter was traced from the level of the right common iliac into the anterior utero-vesical ligament. There was an area at the pelvic brim were the hydronephrosis resolved to normal caliber. There was no tethering, stricture, or apparent injury to the ureter. Based on exam, this seemed consistent with packing and retraction causing some transient dilation that resolved with packing and retractor removal.   Intraoperative frozen section of the left ovary was consistent with high-grade carcinoma of GYN origin, possibly serous. There was no gross evidence of disease at the conclusion of the case, R0 resection.  Specimens: 1) Pelvic washings 2) Uterus with cervix  3) Left ovary and fallopian tube, IOFS c/w  high-grade GYN carcinoma 4) Right fallopian tube and ovary 5) Peritoneal biopsies - anterior cul-de-sac peritoneum, posterior cul-de-sac peritoneum, left pelvic side wall peritoneum, right pelvic side wall peritoneum 6) Right and left pelvic lymph nodes 7) Right and left para-aortic lymph nodes 8) Omentum 9) Anaerobic/Aerobic culture - preliminary read is no organisms     06/04/2018 Procedure    She had port placement    06/05/2018 Cancer Staging    Staging form: Ovary, Fallopian Tube, and Primary Peritoneal Carcinoma, AJCC 8th Edition - Pathologic: FIGO Stage IC2, calculated as Stage IC (pT1c2, pN0, cM0) - Signed by Heath Lark, MD on 06/05/2018    06/27/2018 Imaging    1. No evidence of residual/recurrent tumor in the pelvis. 2. No evidence of metastatic disease in the chest, abdomen or pelvis. 3. Left pelvic sidewall 6.3 x 5.0 cm simple fluid collection, compatible with postsurgical seroma or lymphocele. 4. Small hiatal hernia. 5. Aortic Atherosclerosis (ICD10-I70.0).    06/27/2018 Tumor Marker    Patient's tumor was tested for the following markers: CA-125 Results of the tumor marker test revealed 90.3      FAMILY HISTORY:  We obtained a detailed, 4-generation family history.  Significant diagnoses are listed below: Family History  Problem Relation Age of Onset  . Hypertension Father   . Diabetes Father   . Coronary artery disease Father   . Hyperlipidemia Father   . Colon cancer Father        30's  . Hypertension Mother   . Arthritis Mother   . Cancer Maternal Aunt        type unk  . Ovarian cancer Cousin 60  . Cancer Cousin        mouth    Ms. Prigmore has 2 sons ages 59 and 61 and a daughter who is 9 with no history of cancer.   Ms. Abdelrahman father: died at 59, hx of colon cancer dx in 14's.  Paternal aunts/Uncles:  2 paternal aunts, 1 paternal uncle, no hx of cancer.  Paternal cousins: no hx of cancer reported Paternal grandfather: hx of heart disease, died at  77 Paternal grandmother:died older than 48, no hx of cancer  Ms. Dobosz's mother: died at 36, ho hx of cancer.  She had a hysterectomy.  Maternal Aunts/Uncles: 2 maternal aunts.  1 had cancer- type of cancer unk.  Maternal cousins: 1 maternal cousin had ovarian cancer at 3, another maternal cousin had mouth cancer.  Maternal grandfather: died in his 11's no hx of cancer.  Maternal grandmother:died in her 29's with no hx of cancer.   Ms. Schlotter is unaware of previous family history of genetic testing for hereditary cancer risks. Patient's maternal ancestors are of Caucasian descent, and paternal ancestors are of Caucasian descent. There is no reported Ashkenazi Jewish ancestry. There is no known consanguinity.Ms. Murin has 2 sons ages 18 and 32 and a daughter who is 68 with no history of cancer.   Ms. Sweeny father: died at 41, hx of colon cancer dx in 68's.  Paternal aunts/Uncles: 2 paternal aunts, 1 paternal uncle, no hx of cancer.  Paternal cousins: no hx of cancer reported Paternal grandfather: hx of heart disease, died at 69 Paternal grandmother:died older than 8, no hx of cancer  Ms. Reidel's mother: died at 63, ho hx of cancer.  She had a hysterectomy.  Maternal Aunts/Uncles: 2 maternal aunts.  1 had cancer- type of cancer unk.  Maternal cousins: 1 maternal cousin had ovarian cancer at 67, another maternal cousin had mouth cancer.  Maternal grandfather: died in his 27's no hx of cancer.  Maternal grandmother:died in her 62's with no hx of cancer.   Ms. Perella is unaware of previous family history of genetic testing for hereditary cancer risks. Patient's maternal ancestors are of Caucasian descent, and paternal ancestors are of Caucasian descent. There is no reported Ashkenazi Jewish ancestry. There is no known consanguinity.  GENETIC TEST RESULTS: Genetic testing performed through [facility] Myrisk Panel + [facility] HRD.   Germline Results: Negative, no pathogenic variants were  identified.   The [facility] gene panel offered by [employer] includes sequencing and deletion/duplication testing of the following 35 genes: APC, ATM, BARD1, BMPR1A, BRCA1, BRCA2, BRIP1, CHD1, CDK4, CDKN2A, CHEK2, EPCAM (large rearrangement only), GREM1, HOXB13, AXIN2, MSH3, NTHL1, RNF43, GALNT12, RSP20, MLH1, MSH2, MSH6, MUTYH, NBN, PALB2, PMS2, PTEN, RAD51C, RAD51D, SMAD4, STK11, and TP53. Sequencing was performed for select regions of POLE and POLD1, and large rearrangement analysis was performed for select regions of GREM1.  The test report will be scanned into EPIC and will be located under the Molecular Pathology section of the Results Review tab. A portion of the result report is included below for reference.      Somatic/HRD Results:  POSITIVE for a suspected deleterious mutation in BRCA1 called c.[id number]>G (p.[id number]).   No variants detected in BRCA2.     Genomic Instability Status: Negative     We discussed with Ms. Hedgepeth that because current genetic testing is not perfect, it is possible there may be a gene mutation in one of these genes that current testing cannot detect, but that chance is small.  We also discussed, that there could be another gene that has not yet been discovered, or that we have not yet tested, that is responsible for the cancer diagnoses in the family. It is also possible there is a hereditary cause for the cancer in the family that Ms. Kowalewski did not  inherit and therefore was not identified in her testing.  Therefore, it is important to remain in touch with cancer genetics in the future so that we can continue to offer Ms. Newgent the most up to date genetic testing.   ADDITIONAL GENETIC TESTING: We discussed with Ms. Habel that her genetic testing was fairly extensive.  If there are are genes identified to increase cancer risk that can be analyzed in the future, we would be happy to discuss and coordinate this testing at that time.    CANCER  SCREENING RECOMMENDATIONS: Ms. Fleeman germline test result is  negative (normal).  This means that we have not identified a hereditary cause for her personal and family history of cancer at this time.   This is reassuring and inticates it is unlikely there is a hereditary cause for her cancer.  While reassuring, this does not definitively rule out a hereditary predisposition to cancer. It is still possible that there could be genetic mutations that are undetectable by current technology, or genetic mutations in genes that have not been tested or identified to increase cancer risk.  Therefore, it is recommended she continue to follow the cancer management and screening guidelines provided by her oncology and primary healthcare provider. An individual's cancer risk is not determined by genetic test results alone.  Overall cancer risk assessment includes additional factors such as personal medical history, family history, etc.  These should be used to make a personalized plan for cancer prevention and surveillance.    Due to her father's history of colon cancer, [facility] recommends Ms. Scaduto have colonoscopy every 5 years (or as directed by her doctors).  We recommend she discuss her family history and how this impacts her colon screening with her primary/onoclogy/GI healthcare providers.  Regarding Somatic/HRD results:  A suspected deleterious variant was detected din Ms. Delarocha's tumor sample.  The presence of a BRCA1 deleterious mutation, suggests a person may respond well to certain types of treatment (ex PARP-inhibitor)  However, there are many factors that go into a treatment plan, and Ms. Frosch was instructed to discuss this result and the impact of this result on her treatment with her oncologist.   [place]:  Relatives in this family might be at some increased risk of developing cancer, over the general population risk, simply due to the family history of cancer.  We recommended  women in this family have a yearly mammogram beginning at age 80, or 25 years younger than the earliest onset of cancer, an annual clinical breast exam, and perform monthly breast self-exams. Women in this family should also have a gynecological exam as recommended by their primary provider. All family members should have a colonoscopy as directed by their doctors.  All family members should inform their physicians about the family history of cancer so their doctors can make the most appropriate screening recommendations for them.   It is also possible there is a hereditary cause for the cancer in Ms. Fedewa's family that she did not inherit and therefore was not identified in her.  Therefore, we recommended maternal relatives also have genetic counseling and testing. Ms. Bellanger will let us know if we can be of any assistance in coordinating genetic counseling and/or testing for these family members.   FOLLOW-UP: Lastly, we discussed with Ms. Bucker that cancer genetics is a rapidly advancing field and it is possible that new genetic tests will be appropriate for her and/or her family members in the future. We encouraged her  to remain in contact with cancer genetics on an annual basis so we can update her personal and family histories and let her know of advances in cancer genetics that may benefit this family.   Our contact number was provided. Ms. Wedin questions were answered to her satisfaction, and she knows she is welcome to call us at anytime with additional questions or concerns.   Ferol Luz, MS, [facility] Certified Genetic Counselor [email or web address]

## 2018-07-22 ENCOUNTER — Other Ambulatory Visit: Payer: Medicare HMO

## 2018-07-22 ENCOUNTER — Inpatient Hospital Stay: Payer: Medicare HMO

## 2018-07-22 ENCOUNTER — Ambulatory Visit: Payer: Medicare HMO | Admitting: Hematology and Oncology

## 2018-07-22 ENCOUNTER — Telehealth: Payer: Self-pay

## 2018-07-22 ENCOUNTER — Ambulatory Visit: Payer: Medicare HMO

## 2018-07-22 VITALS — BP 159/86 | HR 75 | Temp 98.2°F | Resp 16 | Ht <= 58 in | Wt 130.8 lb

## 2018-07-22 DIAGNOSIS — I10 Essential (primary) hypertension: Secondary | ICD-10-CM | POA: Diagnosis not present

## 2018-07-22 DIAGNOSIS — Z7901 Long term (current) use of anticoagulants: Secondary | ICD-10-CM | POA: Diagnosis not present

## 2018-07-22 DIAGNOSIS — C562 Malignant neoplasm of left ovary: Secondary | ICD-10-CM

## 2018-07-22 DIAGNOSIS — R221 Localized swelling, mass and lump, neck: Secondary | ICD-10-CM | POA: Diagnosis not present

## 2018-07-22 DIAGNOSIS — M542 Cervicalgia: Secondary | ICD-10-CM | POA: Diagnosis not present

## 2018-07-22 DIAGNOSIS — E041 Nontoxic single thyroid nodule: Secondary | ICD-10-CM | POA: Diagnosis not present

## 2018-07-22 DIAGNOSIS — Z5111 Encounter for antineoplastic chemotherapy: Secondary | ICD-10-CM | POA: Diagnosis not present

## 2018-07-22 DIAGNOSIS — Z79899 Other long term (current) drug therapy: Secondary | ICD-10-CM | POA: Diagnosis not present

## 2018-07-22 DIAGNOSIS — I82C11 Acute embolism and thrombosis of right internal jugular vein: Secondary | ICD-10-CM | POA: Diagnosis not present

## 2018-07-22 LAB — CA 125: Cancer Antigen (CA) 125: 22.8 U/mL (ref 0.0–38.1)

## 2018-07-22 MED ORDER — SODIUM CHLORIDE 0.9 % IV SOLN
Freq: Once | INTRAVENOUS | Status: AC
  Administered 2018-07-22: 12:00:00 via INTRAVENOUS
  Filled 2018-07-22: qty 5

## 2018-07-22 MED ORDER — SODIUM CHLORIDE 0.9 % IV SOLN
175.0000 mg/m2 | Freq: Once | INTRAVENOUS | Status: AC
  Administered 2018-07-22: 270 mg via INTRAVENOUS
  Filled 2018-07-22: qty 45

## 2018-07-22 MED ORDER — PALONOSETRON HCL INJECTION 0.25 MG/5ML
INTRAVENOUS | Status: AC
  Filled 2018-07-22: qty 5

## 2018-07-22 MED ORDER — FAMOTIDINE IN NACL 20-0.9 MG/50ML-% IV SOLN: 20.0000 mg | Freq: Once | INTRAVENOUS | Status: DC

## 2018-07-22 MED ORDER — SODIUM CHLORIDE 0.9 % IV SOLN
Freq: Once | INTRAVENOUS | Status: AC
  Administered 2018-07-22: 11:00:00 via INTRAVENOUS
  Filled 2018-07-22: qty 250

## 2018-07-22 MED ORDER — PALONOSETRON HCL INJECTION 0.25 MG/5ML
0.2500 mg | Freq: Once | INTRAVENOUS | Status: AC
  Administered 2018-07-22: 0.25 mg via INTRAVENOUS

## 2018-07-22 MED ORDER — SODIUM CHLORIDE 0.9 % IV SOLN
20.0000 mg | Freq: Once | INTRAVENOUS | Status: AC
  Administered 2018-07-22: 20 mg via INTRAVENOUS
  Filled 2018-07-22: qty 2

## 2018-07-22 MED ORDER — HEPARIN SOD (PORK) LOCK FLUSH 100 UNIT/ML IV SOLN
500.0000 [IU] | Freq: Once | INTRAVENOUS | Status: AC | PRN
  Administered 2018-07-22: 500 [IU]
  Filled 2018-07-22: qty 5

## 2018-07-22 MED ORDER — SODIUM CHLORIDE 0.9% FLUSH
10.0000 mL | INTRAVENOUS | Status: DC | PRN
  Administered 2018-07-22: 10 mL
  Filled 2018-07-22: qty 10

## 2018-07-22 MED ORDER — SODIUM CHLORIDE 0.9 % IV SOLN
444.6000 mg | Freq: Once | INTRAVENOUS | Status: AC
  Administered 2018-07-22: 440 mg via INTRAVENOUS
  Filled 2018-07-22: qty 44

## 2018-07-22 NOTE — Progress Notes (Signed)
Pt reports that she had restless legs with benadryl & then had ativan which made her have urinary incontinence.  She doesn't want to have either of these drugs.  Informed Sandi Mealy PA & he OK'd holding benadryl today.

## 2018-07-22 NOTE — Patient Instructions (Signed)
[facility] Discharge Instructions for Patients Receiving Chemotherapy  Today you received the following chemotherapy agents Paclitaxel (Taxol) & Carboplatin (Paraplatin)  To help prevent nausea and vomiting after your treatment, we encourage you to take your nausea medication as prescribed.  If you develop nausea and vomiting that is not controlled by your nausea medication, call the clinic.   BELOW ARE SYMPTOMS THAT SHOULD BE REPORTED IMMEDIATELY:  *FEVER GREATER THAN 100.5 F  *CHILLS WITH OR WITHOUT FEVER  NAUSEA AND VOMITING THAT IS NOT CONTROLLED WITH YOUR NAUSEA MEDICATION  *UNUSUAL SHORTNESS OF BREATH  *UNUSUAL BRUISING OR BLEEDING  TENDERNESS IN MOUTH AND THROAT WITH OR WITHOUT PRESENCE OF ULCERS  *URINARY PROBLEMS  *BOWEL PROBLEMS  UNUSUAL RASH Items with * indicate a potential emergency and should be followed up as soon as possible.  Feel free to call the clinic should you have any questions or concerns. The clinic phone number is [phone].  Please show the CHEMO ALERT CARD at check-in to the Emergency Department and triage nurse.   

## 2018-07-22 NOTE — Telephone Encounter (Signed)
Per 11/11 no los 

## 2018-07-23 ENCOUNTER — Telehealth: Payer: Self-pay | Admitting: Oncology

## 2018-07-23 NOTE — Telephone Encounter (Signed)
Drusilla Kanner and asked if we could reschedule her appointments on 08/12/18 to start at 8 am.  She said that would be fine.  Advised her that a scheduler will be calling to confirm the appointments.

## 2018-07-24 ENCOUNTER — Telehealth: Payer: Self-pay | Admitting: Hematology and Oncology

## 2018-07-24 NOTE — Telephone Encounter (Signed)
Per Threasa Beards 11/14 she will take care of appointments. The lab is not available at 8am

## 2018-07-25 ENCOUNTER — Telehealth: Payer: Self-pay | Admitting: Hematology and Oncology

## 2018-07-25 NOTE — Telephone Encounter (Signed)
Called about 12/3

## 2018-08-06 ENCOUNTER — Other Ambulatory Visit: Payer: Self-pay | Admitting: Medical

## 2018-08-06 DIAGNOSIS — I8290 Acute embolism and thrombosis of unspecified vein: Secondary | ICD-10-CM

## 2018-08-12 ENCOUNTER — Inpatient Hospital Stay: Payer: Medicare HMO

## 2018-08-12 ENCOUNTER — Ambulatory Visit: Payer: Medicare HMO

## 2018-08-12 ENCOUNTER — Inpatient Hospital Stay: Payer: Medicare HMO | Attending: Obstetrics

## 2018-08-12 ENCOUNTER — Other Ambulatory Visit: Payer: Medicare HMO

## 2018-08-12 ENCOUNTER — Inpatient Hospital Stay (HOSPITAL_BASED_OUTPATIENT_CLINIC_OR_DEPARTMENT_OTHER): Payer: Medicare HMO | Admitting: Hematology and Oncology

## 2018-08-12 ENCOUNTER — Encounter: Payer: Self-pay | Admitting: Hematology and Oncology

## 2018-08-12 ENCOUNTER — Other Ambulatory Visit: Payer: Self-pay | Admitting: Hematology and Oncology

## 2018-08-12 VITALS — BP 159/85 | HR 66 | Temp 98.0°F | Resp 18

## 2018-08-12 DIAGNOSIS — K219 Gastro-esophageal reflux disease without esophagitis: Secondary | ICD-10-CM | POA: Insufficient documentation

## 2018-08-12 DIAGNOSIS — Z90722 Acquired absence of ovaries, bilateral: Secondary | ICD-10-CM | POA: Diagnosis not present

## 2018-08-12 DIAGNOSIS — R7989 Other specified abnormal findings of blood chemistry: Secondary | ICD-10-CM

## 2018-08-12 DIAGNOSIS — E785 Hyperlipidemia, unspecified: Secondary | ICD-10-CM | POA: Insufficient documentation

## 2018-08-12 DIAGNOSIS — I10 Essential (primary) hypertension: Secondary | ICD-10-CM | POA: Insufficient documentation

## 2018-08-12 DIAGNOSIS — I82621 Acute embolism and thrombosis of deep veins of right upper extremity: Secondary | ICD-10-CM

## 2018-08-12 DIAGNOSIS — Z5111 Encounter for antineoplastic chemotherapy: Secondary | ICD-10-CM | POA: Insufficient documentation

## 2018-08-12 DIAGNOSIS — Z7901 Long term (current) use of anticoagulants: Secondary | ICD-10-CM | POA: Diagnosis not present

## 2018-08-12 DIAGNOSIS — C562 Malignant neoplasm of left ovary: Secondary | ICD-10-CM | POA: Insufficient documentation

## 2018-08-12 DIAGNOSIS — R739 Hyperglycemia, unspecified: Secondary | ICD-10-CM | POA: Diagnosis not present

## 2018-08-12 DIAGNOSIS — T380X5A Adverse effect of glucocorticoids and synthetic analogues, initial encounter: Secondary | ICD-10-CM

## 2018-08-12 DIAGNOSIS — Z90710 Acquired absence of both cervix and uterus: Secondary | ICD-10-CM | POA: Diagnosis not present

## 2018-08-12 LAB — CBC WITH DIFFERENTIAL/PLATELET
Abs Immature Granulocytes: 0.05 10*3/uL (ref 0.00–0.07)
Basophils Absolute: 0 10*3/uL (ref 0.0–0.1)
Basophils Relative: 0 %
EOS PCT: 0 %
Eosinophils Absolute: 0 10*3/uL (ref 0.0–0.5)
HEMATOCRIT: 35.2 % — AB (ref 36.0–46.0)
HEMOGLOBIN: 12.2 g/dL (ref 12.0–15.0)
Immature Granulocytes: 1 %
LYMPHS PCT: 7 %
Lymphs Abs: 0.7 10*3/uL (ref 0.7–4.0)
MCH: 32.4 pg (ref 26.0–34.0)
MCHC: 34.7 g/dL (ref 30.0–36.0)
MCV: 93.4 fL (ref 80.0–100.0)
MONO ABS: 0.1 10*3/uL (ref 0.1–1.0)
Monocytes Relative: 1 %
Neutro Abs: 8.9 10*3/uL — ABNORMAL HIGH (ref 1.7–7.7)
Neutrophils Relative %: 91 %
Platelets: 239 10*3/uL (ref 150–400)
RBC: 3.77 MIL/uL — AB (ref 3.87–5.11)
RDW: 16.2 % — ABNORMAL HIGH (ref 11.5–15.5)
WBC: 9.8 10*3/uL (ref 4.0–10.5)
nRBC: 0 % (ref 0.0–0.2)

## 2018-08-12 LAB — COMPREHENSIVE METABOLIC PANEL
ALT: 21 U/L (ref 0–44)
AST: 17 U/L (ref 15–41)
Albumin: 3.7 g/dL (ref 3.5–5.0)
Alkaline Phosphatase: 43 U/L (ref 38–126)
Anion gap: 14 (ref 5–15)
BILIRUBIN TOTAL: 0.4 mg/dL (ref 0.3–1.2)
BUN: 21 mg/dL (ref 8–23)
CHLORIDE: 105 mmol/L (ref 98–111)
CO2: 20 mmol/L — ABNORMAL LOW (ref 22–32)
CREATININE: 1.27 mg/dL — AB (ref 0.44–1.00)
Calcium: 9.6 mg/dL (ref 8.9–10.3)
GFR calc Af Amer: 50 mL/min — ABNORMAL LOW (ref >60)
GFR, EST NON AFRICAN AMERICAN: 43 mL/min — AB (ref >60)
Glucose, Bld: 321 mg/dL — ABNORMAL HIGH (ref 70–99)
Potassium: 4 mmol/L (ref 3.5–5.1)
Sodium: 139 mmol/L (ref 135–145)
TOTAL PROTEIN: 7.3 g/dL (ref 6.5–8.1)

## 2018-08-12 MED ORDER — INSULIN REGULAR HUMAN 100 UNIT/ML IJ SOLN
10.0000 [IU] | Freq: Once | INTRAMUSCULAR | Status: AC
  Administered 2018-08-12: 10 [IU] via SUBCUTANEOUS
  Filled 2018-08-12: qty 10

## 2018-08-12 MED ORDER — SODIUM CHLORIDE 0.9 % IV SOLN
Freq: Once | INTRAVENOUS | Status: AC
  Administered 2018-08-12: 11:00:00 via INTRAVENOUS
  Filled 2018-08-12: qty 5

## 2018-08-12 MED ORDER — HEPARIN SOD (PORK) LOCK FLUSH 100 UNIT/ML IV SOLN
500.0000 [IU] | Freq: Once | INTRAVENOUS | Status: AC | PRN
  Administered 2018-08-12: 500 [IU]
  Filled 2018-08-12: qty 5

## 2018-08-12 MED ORDER — DIPHENHYDRAMINE HCL 50 MG/ML IJ SOLN: 50.0000 mg | Freq: Once | INTRAMUSCULAR | Status: DC

## 2018-08-12 MED ORDER — SODIUM CHLORIDE 0.9% FLUSH
10.0000 mL | INTRAVENOUS | Status: DC | PRN
  Administered 2018-08-12: 10 mL
  Filled 2018-08-12: qty 10

## 2018-08-12 MED ORDER — SODIUM CHLORIDE 0.9 % IV SOLN
175.0000 mg/m2 | Freq: Once | INTRAVENOUS | Status: AC
  Administered 2018-08-12: 270 mg via INTRAVENOUS
  Filled 2018-08-12: qty 45

## 2018-08-12 MED ORDER — PALONOSETRON HCL INJECTION 0.25 MG/5ML
INTRAVENOUS | Status: AC
  Filled 2018-08-12: qty 5

## 2018-08-12 MED ORDER — SODIUM CHLORIDE 0.9% FLUSH
10.0000 mL | Freq: Once | INTRAVENOUS | Status: AC
  Administered 2018-08-12: 10 mL
  Filled 2018-08-12: qty 10

## 2018-08-12 MED ORDER — RIVAROXABAN 20 MG PO TABS: 20.0000 mg | ORAL_TABLET | Freq: Every day | ORAL | 9 refills | Status: DC

## 2018-08-12 MED ORDER — SODIUM CHLORIDE 0.9 % IV SOLN
Freq: Once | INTRAVENOUS | Status: AC
  Administered 2018-08-12: 10:00:00 via INTRAVENOUS
  Filled 2018-08-12: qty 250

## 2018-08-12 MED ORDER — SODIUM CHLORIDE 0.9 % IV SOLN
382.2000 mg | Freq: Once | INTRAVENOUS | Status: AC
  Administered 2018-08-12: 380 mg via INTRAVENOUS
  Filled 2018-08-12: qty 38

## 2018-08-12 MED ORDER — PALONOSETRON HCL INJECTION 0.25 MG/5ML
0.2500 mg | Freq: Once | INTRAVENOUS | Status: AC
  Administered 2018-08-12: 0.25 mg via INTRAVENOUS

## 2018-08-12 MED ORDER — FAMOTIDINE IN NACL 20-0.9 MG/50ML-% IV SOLN
20.0000 mg | Freq: Once | INTRAVENOUS | Status: AC
  Administered 2018-08-12: 20 mg via INTRAVENOUS

## 2018-08-12 MED ORDER — FAMOTIDINE IN NACL 20-0.9 MG/50ML-% IV SOLN
INTRAVENOUS | Status: AC
  Filled 2018-08-12: qty 50

## 2018-08-12 NOTE — Progress Notes (Signed)
Went to infusion area to introduce myself to patient as Arboriculturist and to offer available resources.  Discussed one-time $1000 Radio broadcast assistant. Gave her my card for any additional financial questions or concerns.

## 2018-08-12 NOTE — Patient Instructions (Signed)
[facility] Discharge Instructions for Patients Receiving Chemotherapy  Today you received the following chemotherapy agents Paclitaxel (Taxol) & Carboplatin (Paraplatin)  To help prevent nausea and vomiting after your treatment, we encourage you to take your nausea medication as prescribed.  If you develop nausea and vomiting that is not controlled by your nausea medication, call the clinic.   BELOW ARE SYMPTOMS THAT SHOULD BE REPORTED IMMEDIATELY:  *FEVER GREATER THAN 100.5 F  *CHILLS WITH OR WITHOUT FEVER  NAUSEA AND VOMITING THAT IS NOT CONTROLLED WITH YOUR NAUSEA MEDICATION  *UNUSUAL SHORTNESS OF BREATH  *UNUSUAL BRUISING OR BLEEDING  TENDERNESS IN MOUTH AND THROAT WITH OR WITHOUT PRESENCE OF ULCERS  *URINARY PROBLEMS  *BOWEL PROBLEMS  UNUSUAL RASH Items with * indicate a potential emergency and should be followed up as soon as possible.  Feel free to call the clinic should you have any questions or concerns. The clinic phone number is [phone].  Please show the CHEMO ALERT CARD at check-in to the Emergency Department and triage nurse.   

## 2018-08-13 ENCOUNTER — Telehealth: Payer: Self-pay | Admitting: Hematology and Oncology

## 2018-08-13 ENCOUNTER — Encounter: Payer: Self-pay | Admitting: Hematology and Oncology

## 2018-08-13 DIAGNOSIS — R739 Hyperglycemia, unspecified: Secondary | ICD-10-CM | POA: Insufficient documentation

## 2018-08-13 DIAGNOSIS — R7989 Other specified abnormal findings of blood chemistry: Secondary | ICD-10-CM | POA: Insufficient documentation

## 2018-08-13 DIAGNOSIS — T380X5A Adverse effect of glucocorticoids and synthetic analogues, initial encounter: Secondary | ICD-10-CM

## 2018-08-13 DIAGNOSIS — I82629 Acute embolism and thrombosis of deep veins of unspecified upper extremity: Secondary | ICD-10-CM | POA: Insufficient documentation

## 2018-08-13 LAB — CA 125: Cancer Antigen (CA) 125: 21.1 U/mL (ref 0.0–38.1)

## 2018-08-13 NOTE — Assessment & Plan Note (Signed)
So far, she tolerated chemotherapy very well without major side effects such as nausea, neuropathy or changes in bowel habits It was noted that her blood sugar is very high and that has caused some elevated serum creatinine I recommend reducing oral dexamethasone to 8 mg to be taken the night before and 8 mg the morning of treatment instead of 20 mg I recommend decoupling her lab appointment and chemotherapy due to long infusion time

## 2018-08-13 NOTE — Telephone Encounter (Signed)
Per 12/3 los.  Called patient with 12/23 and 12/24 appts and will mailed calendar with all appointments as per 12/3 los.

## 2018-08-13 NOTE — Assessment & Plan Note (Signed)
This is likely related to mild dehydration from severe hyperglycemia We will adjust the chemotherapy dose accordingly Recommend increase oral fluid hydration therapy

## 2018-08-13 NOTE — Progress Notes (Signed)
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[place]) So far, she tolerated chemotherapy very well without major side effects such as nausea, neuropathy or changes in bowel habits It was noted that her blood sugar is very high and that has caused some elevated serum creatinine I recommend reducing oral dexamethasone to 8 mg to be taken the night before and 8 mg the morning of treatment instead of 20 mg I recommend decoupling her lab appointment and chemotherapy due to long infusion time  DVT of upper extremity (deep vein thrombosis) ([place]) She is doing well with anticoagulation therapy and we will continue Xarelto until completion of therapy and removal of port  Steroid-induced hyperglycemia She has profound hyperglycemia I recommend reducing oral premedication dexamethasone and dietary modification I will order 1 dose of subcutaneous insulin  Elevated serum creatinine This is likely related to mild dehydration from severe hyperglycemia We will adjust the chemotherapy dose accordingly Recommend increase oral fluid hydration therapy   No orders of the defined types were placed in this encounter.   INTERVAL HISTORY: Please see below for problem oriented charting. 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 Large pelvic  mass with heterogeneous appearance, measuring at least 15 centimeters in diameter. Although the findings favor enlarged uterus/fibroids, ovaries are not well seen and an ovarian mass should also be considered. Further characterization with pelvic ultrasound is recommended. 2. The bladder is completely decompressed. There is LEFT-sided hydronephrosis secondary to extrinsic compression from the pelvic mass. 3. Irregular thickening of the sigmoid colon, suspicious for colonic wall mass, measuring 1.3 centimeters. In this patient with a family history of colon cancer, further evaluation with colonoscopy is recommended. 4. Colonic diverticula without acute diverticulitis. 5. Moderate hiatal hernia.     05/15/2018 Imaging    MRI pelvis 16 cm complex cystic and solid mass which is centered in the left posterior adnexa and is contiguous with the uterus. This is suspicious for ovarian carcinoma, with differential diagnosis of atypical pedunculated fibroid with peripheral cystic degeneration. Surgical evaluation is recommended.  Other uterine fibroids measuring up to 4.8 cm.  No evidence of pelvic metastatic disease.     05/16/2018 Imaging    US pelvis 1. Large pelvic mass described in detail on MRI examination 1 day prior.  2. Slight ureteral prominence bilaterally. Mild fullness of the left renal collecting system. No obstructing focus evident. Suspect fullness of these structures due to ureteral compression by the sizable pelvic mass.  3.  Study otherwise unremarkable.     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, with necrosis  - Carcinoma  is adherent to fallopian tube, consistent with surface involvement (stage pT1c2) - Focal endometriosis present - See  synoptic report and comment  B: Uterus with cervix and right ovary and fallopian tube, hysterectomy and right salpingo-oophorectomy Cervix: Benign with Nabothian cysts Endometrium: Endometrial polyp (size 3.5 cm); inactive endometrium with cystic glandular changes Myometrium: Leiomyomata with hyalinization and calcification (size up to 4.2 cm); focal adenomyosis  Right ovary: Benign physiologic changes Right fallopian tube: Small paratubal cyst and no malignancy identified  C: Lymph nodes, right pelvic, regional resection - Nine lymph nodes with no metastatic carcinoma identified (0/9)  D: Lymph nodes, left pelvic, regional resection - Five lymph nodes with no metastatic carcinoma identified (0/5)  E: Cul-de-sac, anterior, biopsy - Fibroadipose tissue with no carcinoma identified  F: Cul-de-sac, posterior, biopsy - Fibrous tissue with inflammation and no carcinoma identified  G: Pelvic sidewall, right, biopsy - Fibroadipose tissue with focal crushed CD10 positive cells, cannot exclude endometrial type stroma - No carcinoma identified  H: Pelvic sidewall, left, biopsy - Fibroadipose tissue with focal crushed cells, cannot exclude endometrial type stroma - No carcinoma identified  I: Omentum, omentectomy - Adipose and fibrovascular tissue consistent with omentum - No carcinoma identified  J: Lymph nodes, left periaortic, regional resection - Two small lymph nodes with no metastatic carcinoma identified (0/2)  K: Lymph nodes, right periaortic, regional resection - Three small lymph nodes with no metastatic carcinoma identified (0/3)  This electronic signature is attestation that the pathologist personally reviewed the submitted material(s) and the final diagnosis reflects that evaluation.    Synoptic Report     OVARY or FALLOPIAN TUBE or PRIMARY PERITONEUM(Ovary FT Perit - All Specimens)    SPECIMEN  Procedure:Total hysterectomy and bilateral salpingo-oophorectomy    Procedure:Omentectomy   Procedure:Peritonealbiopsies   Procedure:Peritoneal washing   :  Specimen Integrity of Left Ovary:Received largely intact with multiple areas of disruption   Tumor Site:Left ovary   Histologic Type:Clear cell carcinoma   Histologic Grade:High grade   :  Tumor Size:Greatest dimension in Centimeters (cm): 14.0 Centimeters (cm)  Additional Dimension in Centimeters (cm):10 Centimeters (cm)  Additional Dimension in Centimeters (cm):7 Centimeters (cm)  Tumor Extent:  Ovarian Surface Involvement:Present   Laterality:Left   Other Tissue / Organ Involvement:Not identified   Peritoneal Ascitic Fluid:Negative for malignancy (normal / benign)   Pleural Fluid:Not submitted / unknown   Accessory Findings:  LYMPH NODES  Regional Lymph Nodes:All lymph nodes negative for tumor cells   Number of Lymph Nodes Examined:19   Site(s):Right pelvic: 9   Site(s):Left pelvic: 5   Site(s):Right para-aortic: 3   Site(s):Left para-aortic: 2   PATHOLOGIC STAGE CLASSIFICATION (pTNM, AJCC 8th Edition)  Primary Tumor (pT):pT1c2   Regional Lymph Nodes (pN):pN0   FIGO STAGE  FIGO Stage:IC2   ADDITIONAL FINDINGS  Additional Pathologic Findings:Endometriosis   Additional Pathologic Findings:Benign fallopian tube with adherence to carcinoma; also see additional parts   Comment(s)  Comment(s):Also see diagnosis comment.   The frozen section diagnosis is confirmed for specimen A. Sections demonstrate a carcinoma with tubulocystic and papillary architecture with hyalinized cores, lined by cells with high grade nuclei, clear to eosinophilic cytoplasm, and nuclear hobnailing.  The morphologic features are most suggestive of clear cell carcinoma.  Immunohistochemical stains are performed, and demonstrate that the carcinoma is  positive for CK7 and PAX8, consistent with gynecologic origin. It has patchy positivity for Napsin A, wild type p53 staining, and is negative for ER, supporting the diagnosis of clear cell carcinoma.   Sections  of the tumor demonstrate carcinoma with adherence to fallopian tube surface, consistent with surface involvement.  Per the operative note, findings were also suggestive of preoperative rupture of the mass.  Given both these findings, the tumor is staged as a pT1c2 (FIGO IC2)             05/27/2018 Surgery    Preoperative Diagnoses: Pelvic mass  Postoperative Diagnoses: left fallopian tube and ovary consistent with high-grade carcinoma of likely GYN origin. No evidence of metastatic disease.  Procedures: Exploratory laparotomy, total abdominal hysterectomy, bilateral salpingo-oophorectomy, peritoneal biopsies for ovarian cancer staging, bilateral pelvic lymphadenectomy, bilateral para-aortic lymphadenectomy, infracolic omentectomy, R0 resection   Surgeon: Marti Sleigh, MD  Assistants: Tona Sensing, MD - Fellow, Haroldine Laws, MD - Resident, Feliberto Harts, PA-S  Findings: On BME, the cervix is retracted anteriorly with no palpable adnexal fullness. There is no RV septum nodularity. Abdominal exam reveals a mobile large mass that extends to the umbilicus and nearly to the pelvic side walls. On abdominal entry, there was immediate green-brown pelvic fluid that appeared consistent with a spontaneous rupture of the cystic fluid-filled mass. The left ovary was a large cystic complex mass, roughly 15cm in size with smooth surface but containing friable tumor. The mass was densely adherent along the entire posterior wall of the uterus. The uterus had some small intramural fibroids and was roughly 10cm in total size. The right fallopian tube and ovary were normal appearing. The patient has smooth paracolic gutters, liver edge, diaphragm, spleen and stomach. The omentum was retracted but  no evidence of nodularity or gross tumor. The anterior cul-de-sac was clear and the posterior-cul-de-sac peritoneum was clear, but the left ovarian mass wall was adherent to the rectosigmoid and cul-de-sac. There were no palpable pelvic or para-aortic adenopathy.   Of note, an the conclusion of the case, there was mild RIGHT hydronephrosis noted. The ureter was traced from the level of the right common iliac into the anterior utero-vesical ligament. There was an area at the pelvic brim were the hydronephrosis resolved to normal caliber. There was no tethering, stricture, or apparent injury to the ureter. Based on exam, this seemed consistent with packing and retraction causing some transient dilation that resolved with packing and retractor removal.   Intraoperative frozen section of the left ovary was consistent with high-grade carcinoma of GYN origin, possibly serous. There was no gross evidence of disease at the conclusion of the case, R0 resection.  Specimens: 1) Pelvic washings 2) Uterus with cervix  3) Left ovary and fallopian tube, IOFS c/w high-grade GYN carcinoma 4) Right fallopian tube and ovary 5) Peritoneal biopsies - anterior cul-de-sac peritoneum, posterior cul-de-sac peritoneum, left pelvic side wall peritoneum, right pelvic side wall peritoneum 6) Right and left pelvic lymph nodes 7) Right and left para-aortic lymph nodes 8) Omentum 9) Anaerobic/Aerobic culture - preliminary read is no organisms     05/27/2018 Genetic Testing    Patient has genetic testing done for genetic disorder Results revealed patient has the following mutation(s): BRCA 1 on tissue block, neg blood    06/04/2018 Procedure    She had port placement    06/05/2018 Cancer Staging    Staging form: Ovary, Fallopian Tube, and Primary Peritoneal Carcinoma, AJCC 8th Edition - Pathologic: FIGO Stage IC2, calculated as Stage IC (pT1c2, pN0, cM0) - Signed by Heath Lark, MD on 06/05/2018    06/27/2018 Imaging    1.  No evidence of residual/recurrent tumor in the pelvis. 2. No evidence of metastatic disease  in the chest, abdomen or pelvis. 3. Left pelvic sidewall 6.3 x 5.0 cm simple fluid collection, compatible with postsurgical seroma or lymphocele. 4. Small hiatal hernia. 5. Aortic Atherosclerosis (ICD10-I70.0).    06/27/2018 Tumor Marker    Patient's tumor was tested for the following markers: CA-125 Results of the tumor marker test revealed 90.3    07/15/2018 Imaging    Vascular US Findings consistent with acute deep vein thrombosis involving the right internal jugular veins. She is placed on Xarelto    07/21/2018 Tumor Marker    Patient's tumor was tested for the following markers: CA-125 Results of the tumor marker test revealed 22.8     REVIEW OF SYSTEMS:   Constitutional: Denies fevers, chills or abnormal weight loss Eyes: Denies blurriness of vision Ears, nose, mouth, throat, and face: Denies mucositis or sore throat Respiratory: Denies cough, dyspnea or wheezes Cardiovascular: Denies palpitation, chest discomfort or lower extremity swelling Gastrointestinal:  Denies nausea, heartburn or change in bowel habits Skin: Denies abnormal skin rashes Lymphatics: Denies new lymphadenopathy or easy bruising Neurological:Denies numbness, tingling or new weaknesses Behavioral/Psych: Mood is stable, no new changes  All other systems were reviewed with the patient and are negative.  I have reviewed the past medical history, past surgical history, social history and family history with the patient and they are unchanged from previous note.  ALLERGIES:  has No Known Allergies.  MEDICATIONS:  Current Outpatient Medications  Medication Sig Dispense Refill  . acetaminophen (TYLENOL) 325 MG tablet Take 650 mg by mouth every 6 (six) hours as needed.    Marland Kitchen alendronate (FOSAMAX) 70 MG tablet TAKE 1 TABLET EVERY WEEK 12 tablet 3  . dexamethasone (DECADRON) 4 MG tablet Take 5 tabs at the night before  and 5 tab the morning of chemotherapy, every 3 weeks, by mouth 60 tablet 0  . lidocaine-prilocaine (EMLA) cream Apply to affected area once 30 g 3  . losartan-hydrochlorothiazide (HYZAAR) 100-12.5 MG tablet Take 1 tablet by mouth daily. 90 tablet 3  . metoprolol tartrate (LOPRESSOR) 50 MG tablet TAKE 1 TABLET TWICE DAILY 180 tablet 1  . Multiple Vitamin (MULTIVITAMIN) tablet Take 1 tablet by mouth 2 (two) times daily.     Marland Kitchen omeprazole (PRILOSEC) 20 MG capsule Take 1 capsule (20 mg total) by mouth daily. 90 capsule 3  . ondansetron (ZOFRAN) 8 MG tablet Take 1 tablet (8 mg total) by mouth every 8 (eight) hours as needed for refractory nausea / vomiting. Start on day 3 after chemo. 30 tablet 1  . prochlorperazine (COMPAZINE) 10 MG tablet TAKE 1 TABLET(10 MG) BY MOUTH EVERY 6 HOURS AS NEEDED FOR NAUSEA OR VOMITING 385 tablet 0  . rivaroxaban (XARELTO) 20 MG TABS tablet Take 1 tablet (20 mg total) by mouth daily with supper. 90 tablet 9  . simvastatin (ZOCOR) 20 MG tablet Take 1 tablet (20 mg total) by mouth daily. 90 tablet 3  . triamcinolone (NASACORT) 55 MCG/ACT AERO nasal inhaler Place 2 sprays into the nose 2 (two) times daily. 1 Inhaler 12   No current facility-administered medications for this visit.     PHYSICAL EXAMINATION: ECOG PERFORMANCE STATUS: 1 - Symptomatic but completely ambulatory GENERAL:alert, no distress and comfortable SKIN: skin color, texture, turgor are normal, no rashes or significant lesions EYES: normal, Conjunctiva are pink and non-injected, sclera clear OROPHARYNX:no exudate, no erythema and lips, buccal mucosa, and tongue normal  NECK: supple, thyroid normal size, non-tender, without nodularity LYMPH:  no palpable lymphadenopathy in the cervical, axillary or  inguinal LUNGS: clear to auscultation and percussion with normal breathing effort HEART: regular rate & rhythm and no murmurs and no lower extremity edema ABDOMEN:abdomen soft, non-tender and normal bowel  sounds Musculoskeletal:no cyanosis of digits and no clubbing  NEURO: alert & oriented x 3 with fluent speech, no focal motor/sensory deficits  LABORATORY DATA:  I have reviewed the data as listed    Component Value Date/Time   NA 139 08/12/2018 0841   NA 140 02/29/2016 1027   K 4.0 08/12/2018 0841   CL 105 08/12/2018 0841   CO2 20 (L) 08/12/2018 0841   GLUCOSE 321 (H) 08/12/2018 0841   BUN 21 08/12/2018 0841   BUN 17 02/29/2016 1027   CREATININE 1.27 (H) 08/12/2018 0841   CREATININE 1.11 (H) 03/12/2018 0906   CALCIUM 9.6 08/12/2018 0841   PROT 7.3 08/12/2018 0841   PROT 7.0 02/29/2016 1027   ALBUMIN 3.7 08/12/2018 0841   ALBUMIN 4.3 02/29/2016 1027   AST 17 08/12/2018 0841   ALT 21 08/12/2018 0841   ALKPHOS 43 08/12/2018 0841   BILITOT 0.4 08/12/2018 0841   BILITOT 0.9 02/29/2016 1027   GFRNONAA 43 (L) 08/12/2018 0841   GFRNONAA 51 (L) 03/12/2018 0906   GFRAA 50 (L) 08/12/2018 0841   GFRAA 59 (L) 03/12/2018 0906    No results found for: SPEP, UPEP  Lab Results  Component Value Date   WBC 9.8 08/12/2018   NEUTROABS 8.9 (H) 08/12/2018   HGB 12.2 08/12/2018   HCT 35.2 (L) 08/12/2018   MCV 93.4 08/12/2018   PLT 239 08/12/2018      Chemistry      Component Value Date/Time   NA 139 08/12/2018 0841   NA 140 02/29/2016 1027   K 4.0 08/12/2018 0841   CL 105 08/12/2018 0841   CO2 20 (L) 08/12/2018 0841   BUN 21 08/12/2018 0841   BUN 17 02/29/2016 1027   CREATININE 1.27 (H) 08/12/2018 0841   CREATININE 1.11 (H) 03/12/2018 0906      Component Value Date/Time   CALCIUM 9.6 08/12/2018 0841   ALKPHOS 43 08/12/2018 0841   AST 17 08/12/2018 0841   ALT 21 08/12/2018 0841   BILITOT 0.4 08/12/2018 0841   BILITOT 0.9 02/29/2016 1027       RADIOGRAPHIC STUDIES: I have personally reviewed the radiological images as listed and agreed with the findings in the report. Ir Cv Line Injection  Result Date: 07/15/2018 INDICATION: Neck swelling EXAM: PORT-A-CATH INJECTION  MEDICATIONS: None ANESTHESIA/SEDATION: None FLUOROSCOPY TIME:  Fluoroscopy Time:  minutes 48 seconds (8.9 mGy). COMPLICATIONS: None immediate. PROCEDURE: Informed written consent was obtained from the patient after a thorough discussion of the procedural risks, benefits and alternatives. All questions were addressed. Maximal Sterile Barrier Technique was utilized including caps, mask, sterile gowns, sterile gloves, sterile drape, hand hygiene and skin antiseptic. A timeout was performed prior to the initiation of the procedure. Utilizing sterile technique, a uterine needle was advanced into the right jugular vein Port-A-Cath reservoir. Contrast was injected. It was then flushed with heparinized saline. FINDINGS: The port is functioning normally. Contrast injected fills the reservoir and exits the tip of the catheter. The tip is at the cavoatrial junction. IMPRESSION: Right jugular vein Port-A-Cath is patent without evidence of extravasation to suggest complication or leak. Tip is at the cavoatrial junction. Electronically Signed   By: Marybelle Killings M.D.   On: 07/15/2018 14:30   Vas [place] Upper Extremity Venous Duplex  Result Date: 07/15/2018 UPPER VENOUS STUDY  Indications: Swelling Performing Technologist: Abram Sander  Examination Guidelines: A complete evaluation includes B-mode imaging, spectral Doppler, color Doppler, and power Doppler as needed of all accessible portions of each vessel. Bilateral testing is considered an integral part of a complete examination. Limited examinations for reoccurring indications may be performed as noted.  Right Findings: +----------+------------+----------+---------+-----------+--------------+ RIGHT     CompressiblePropertiesPhasicitySpontaneous   Summary     +----------+------------+----------+---------+-----------+--------------+ IJV           None                 No        No         Acute       +----------+------------+----------+---------+-----------+--------------+ Subclavian    Full                 Yes       Yes                   +----------+------------+----------+---------+-----------+--------------+ Axillary      Full                 Yes       Yes                   +----------+------------+----------+---------+-----------+--------------+ Brachial      Full                 Yes       Yes                   +----------+------------+----------+---------+-----------+--------------+ Radial        Full                                                 +----------+------------+----------+---------+-----------+--------------+ Ulnar         Full                                                 +----------+------------+----------+---------+-----------+--------------+ Cephalic                                            not visualized +----------+------------+----------+---------+-----------+--------------+ Basilic       Full                                                 +----------+------------+----------+---------+-----------+--------------+  Left Findings: +----------+------------+----------+---------+-----------+-------+ LEFT      CompressiblePropertiesPhasicitySpontaneousSummary +----------+------------+----------+---------+-----------+-------+ Subclavian                         Yes       Yes            +----------+------------+----------+---------+-----------+-------+  Summary:  Right: Findings consistent with acute deep vein thrombosis involving the right internal jugular veins.  Left: No evidence of thrombosis in the subclavian.  *See table(s) above for measurements and observations.  Diagnosing physician: Harold Barban MD Electronically signed by Harold Barban MD on 07/15/2018 at 6:55:00 PM.  Final     All questions were answered. The patient knows to call the clinic with any problems, questions or concerns. No barriers to learning was  detected.  I spent 15 minutes counseling the patient face to face. The total time spent in the appointment was 20 minutes and more than 50% was on counseling and review of test results  Heath Lark, MD 08/13/2018 7:45 AM

## 2018-08-13 NOTE — Assessment & Plan Note (Signed)
She has profound hyperglycemia I recommend reducing oral premedication dexamethasone and dietary modification I will order 1 dose of subcutaneous insulin

## 2018-08-13 NOTE — Assessment & Plan Note (Signed)
She is doing well with anticoagulation therapy and we will continue Xarelto until completion of therapy and removal of port

## 2018-08-19 ENCOUNTER — Inpatient Hospital Stay (HOSPITAL_BASED_OUTPATIENT_CLINIC_OR_DEPARTMENT_OTHER): Payer: Medicare HMO | Admitting: Gynecology

## 2018-08-19 ENCOUNTER — Encounter: Payer: Self-pay | Admitting: Gynecology

## 2018-08-19 VITALS — BP 150/78 | HR 62 | Temp 98.5°F | Resp 18 | Ht <= 58 in | Wt 131.0 lb

## 2018-08-19 DIAGNOSIS — C562 Malignant neoplasm of left ovary: Secondary | ICD-10-CM | POA: Diagnosis not present

## 2018-08-19 DIAGNOSIS — Z90722 Acquired absence of ovaries, bilateral: Secondary | ICD-10-CM

## 2018-08-19 DIAGNOSIS — Z5111 Encounter for antineoplastic chemotherapy: Secondary | ICD-10-CM | POA: Diagnosis not present

## 2018-08-19 DIAGNOSIS — E785 Hyperlipidemia, unspecified: Secondary | ICD-10-CM | POA: Diagnosis not present

## 2018-08-19 DIAGNOSIS — Z90710 Acquired absence of both cervix and uterus: Secondary | ICD-10-CM | POA: Diagnosis not present

## 2018-08-19 DIAGNOSIS — R739 Hyperglycemia, unspecified: Secondary | ICD-10-CM | POA: Diagnosis not present

## 2018-08-19 DIAGNOSIS — I10 Essential (primary) hypertension: Secondary | ICD-10-CM | POA: Diagnosis not present

## 2018-08-19 DIAGNOSIS — K219 Gastro-esophageal reflux disease without esophagitis: Secondary | ICD-10-CM | POA: Diagnosis not present

## 2018-08-19 DIAGNOSIS — I82621 Acute embolism and thrombosis of deep veins of right upper extremity: Secondary | ICD-10-CM | POA: Diagnosis not present

## 2018-08-19 NOTE — Patient Instructions (Signed)
Continue with Chemotherapy for 3 more cycles. Dr. Alvy Bimler will order a f/u CT scan prior to visit in March with Dr. Fermin Schwab.

## 2018-08-19 NOTE — Progress Notes (Signed)
Consult Note: Gyn-Onc   Melissa Simpson 67 y.o. female  Chief Complaint  Patient presents with  . 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  Patient seems to be tolerating her adjuvant chemotherapy well.  Plan: We will continue through 6 cycles of chemotherapy as planned.  Approximate month after her last treatment we will obtain another CT scan for reassessment.  Continue to obtain Ca1 25 prior to each cycle of chemotherapy.  Return to see me following her last chemotherapy cycle and CT scan.  Interval History: Patient returns today as scheduled for continuing follow-up.  She has now received 3 cycles of carboplatin Taxol and seems to be tolerating the therapy well except for some hyperglycemia associated with steroid use.  Her recent Ca1 25 value was 21 units/mL.  Patient denies any significant GI GU or pelvic symptoms.  She does not have any neuropathy.  Overall her functional status is good and she hosted 73 family members for Thanksgiving dinner.  HPI: Patient initially presented with a large abdominal pelvic mass and abdominal pain.  She underwent expiratory laparotomy May 27, 2018.  She had a 15 cm left ovarian complex mass.  She underwent debulking and surgical staging including total abdominal hysterectomy, bilateral salpingo-oophorectomy, peritoneal biopsies, bilateral pelvic and periodic lymphadenectomy and infracolic omentectomy.  She had an uncomplicated postoperative course.  Final pathology showed a stage Ic clear cell carcinoma of the ovary.  The ovarian mass had ruptured preoperatively.  Review of Systems:10 point review of systems is negative except as noted in interval history.   Vitals: Blood pressure (!) 150/78, pulse 62, temperature 98.5 F (36.9 C), temperature source Oral, resp. rate 18, height 4\' 10"  (1.473 m), weight 131 lb (59.4 kg), SpO2 99 %.  Physical Exam: General : The patient is a healthy woman  in no acute distress.  HEENT: normocephalic, extraoccular movements normal; neck is supple without thyromegally  Lynphnodes: Supraclavicular and inguinal nodes not enlarged  Abdomen: Soft, non-tender, no ascites, no organomegally, no masses, no hernias  Pelvic:  EGBUS: Normal female  Vagina: Normal, no lesions  Urethra and Bladder: Normal, non-tender  Cervix: Surgically absent  Uterus: Surgically absent  Bi-manual examination: There is a smooth 6 cm cystic mass to the left of the vaginal apex consistent with a lymphocele.  No other nodularity is noted. Rectal: normal sphincter tone, no masses, no blood  Lower extremities: No edema or varicosities. Normal range of motion      No Known Allergies  Past Medical History:  Diagnosis Date  . Cancer ([place])    left ovarian cancer  . Family history of colon cancer   . Family history of ovarian cancer   . GERD (gastroesophageal reflux disease)   . Hyperlipidemia   . Hypertension     Past Surgical History:  Procedure Laterality Date  . ABDOMINAL HYSTERECTOMY    . BIOPSY THYROID  2008  . COLONOSCOPY    . IR CV LINE INJECTION  07/15/2018  . TONSILECTOMY, ADENOIDECTOMY, BILATERAL MYRINGOTOMY AND TUBES Bilateral 1957    Current Outpatient Medications  Medication Sig Dispense Refill  . acetaminophen (TYLENOL) 325 MG tablet Take 650 mg by mouth every 6 (six) hours as needed.    Marland Kitchen alendronate (FOSAMAX) 70 MG tablet TAKE 1 TABLET EVERY WEEK 12 tablet 3  . dexamethasone (DECADRON) 4 MG tablet Take 5 tabs at the night before and 5 tab the morning of chemotherapy, every 3  weeks, by mouth 60 tablet 0  . lidocaine-prilocaine (EMLA) cream Apply to affected area once 30 g 3  . losartan-hydrochlorothiazide (HYZAAR) 100-12.5 MG tablet Take 1 tablet by mouth daily. 90 tablet 3  . metoprolol tartrate (LOPRESSOR) 50 MG tablet TAKE 1 TABLET TWICE DAILY 180 tablet 1  . Multiple Vitamin (MULTIVITAMIN) tablet Take 1 tablet by mouth 2 (two) times daily.      Marland Kitchen omeprazole (PRILOSEC) 20 MG capsule Take 1 capsule (20 mg total) by mouth daily. 90 capsule 3  . ondansetron (ZOFRAN) 8 MG tablet Take 1 tablet (8 mg total) by mouth every 8 (eight) hours as needed for refractory nausea / vomiting. Start on day 3 after chemo. 30 tablet 1  . prochlorperazine (COMPAZINE) 10 MG tablet TAKE 1 TABLET(10 MG) BY MOUTH EVERY 6 HOURS AS NEEDED FOR NAUSEA OR VOMITING 385 tablet 0  . rivaroxaban (XARELTO) 20 MG TABS tablet Take 1 tablet (20 mg total) by mouth daily with supper. 90 tablet 9  . simvastatin (ZOCOR) 20 MG tablet Take 1 tablet (20 mg total) by mouth daily. 90 tablet 3  . triamcinolone (NASACORT) 55 MCG/ACT AERO nasal inhaler Place 2 sprays into the nose 2 (two) times daily. 1 Inhaler 12   No current facility-administered medications for this visit.     Social History   Socioeconomic History  . Marital status: Married    Spouse name: Louie Casa  . Number of children: 3  . Years of education: Not on file  . Highest education level: Not on file  Occupational History  . Occupation: retired Solicitor  Social Needs  . Financial resource strain: Not hard at all  . Food insecurity:    Worry: Never true    Inability: Never true  . Transportation needs:    Medical: No    Non-medical: No  Tobacco Use  . Smoking status: Never Smoker  . Smokeless tobacco: Never Used  Substance and Sexual Activity  . Alcohol use: No  . Drug use: No  . Sexual activity: Not on file  Lifestyle  . Physical activity:    Days per week: 0 days    Minutes per session: 0 min  . Stress: Only a little  Relationships  . Social connections:    Talks on phone: Never    Gets together: More than three times a week    Attends religious service: 1 to 4 times per year    Active member of club or organization: No    Attends meetings of clubs or organizations: Never    Relationship status: Married  . Intimate partner violence:    Fear of current or ex partner: No    Emotionally  abused: No    Physically abused: No    Forced sexual activity: No  Other Topics Concern  . Not on file  Social History Narrative   Diet:Unrestricted   Do you drink/eat things with caffeine? Rarely   Marital status: Married                             What year were you married? 1992   Do you live in a house, apartment, assisted living, condo, trailer, etc)? House   Is it one or more stories? 1   How many persons live in your home? 3   Do you have any pets in your home?  2 small dogs   Current or past profession: Chief of Staff   Do  you exercise?                                                     Type & how often:   Do you have a living will? No   Do you have a DNR Form? No   Do you have a POA/HPOA forms? NO    Family History  Problem Relation Age of Onset  . Hypertension Father   . Diabetes Father   . Coronary artery disease Father   . Hyperlipidemia Father   . Colon cancer Father        44's  . Hypertension Mother   . Arthritis Mother   . Cancer Maternal Aunt        type unk  . Ovarian cancer Cousin 45  . Cancer Cousin        mouth      Marti Sleigh, MD 08/19/2018, 10:13 AM

## 2018-09-01 ENCOUNTER — Inpatient Hospital Stay: Payer: Medicare HMO

## 2018-09-01 ENCOUNTER — Encounter: Payer: Self-pay | Admitting: Hematology and Oncology

## 2018-09-01 ENCOUNTER — Inpatient Hospital Stay (HOSPITAL_BASED_OUTPATIENT_CLINIC_OR_DEPARTMENT_OTHER): Payer: Medicare HMO | Admitting: Hematology and Oncology

## 2018-09-01 ENCOUNTER — Telehealth: Payer: Self-pay | Admitting: Hematology and Oncology

## 2018-09-01 DIAGNOSIS — E785 Hyperlipidemia, unspecified: Secondary | ICD-10-CM | POA: Diagnosis not present

## 2018-09-01 DIAGNOSIS — C562 Malignant neoplasm of left ovary: Secondary | ICD-10-CM

## 2018-09-01 DIAGNOSIS — Z90722 Acquired absence of ovaries, bilateral: Secondary | ICD-10-CM | POA: Diagnosis not present

## 2018-09-01 DIAGNOSIS — Z5111 Encounter for antineoplastic chemotherapy: Secondary | ICD-10-CM | POA: Diagnosis not present

## 2018-09-01 DIAGNOSIS — K219 Gastro-esophageal reflux disease without esophagitis: Secondary | ICD-10-CM | POA: Diagnosis not present

## 2018-09-01 DIAGNOSIS — I82621 Acute embolism and thrombosis of deep veins of right upper extremity: Secondary | ICD-10-CM | POA: Diagnosis not present

## 2018-09-01 DIAGNOSIS — R739 Hyperglycemia, unspecified: Secondary | ICD-10-CM

## 2018-09-01 DIAGNOSIS — T380X5A Adverse effect of glucocorticoids and synthetic analogues, initial encounter: Secondary | ICD-10-CM

## 2018-09-01 DIAGNOSIS — I10 Essential (primary) hypertension: Secondary | ICD-10-CM | POA: Diagnosis not present

## 2018-09-01 DIAGNOSIS — Z90710 Acquired absence of both cervix and uterus: Secondary | ICD-10-CM | POA: Diagnosis not present

## 2018-09-01 LAB — CBC WITH DIFFERENTIAL/PLATELET
Abs Immature Granulocytes: 0.03 10*3/uL (ref 0.00–0.07)
Basophils Absolute: 0 10*3/uL (ref 0.0–0.1)
Basophils Relative: 1 %
EOS ABS: 0 10*3/uL (ref 0.0–0.5)
Eosinophils Relative: 1 %
HCT: 33.4 % — ABNORMAL LOW (ref 36.0–46.0)
Hemoglobin: 11.7 g/dL — ABNORMAL LOW (ref 12.0–15.0)
Immature Granulocytes: 1 %
Lymphocytes Relative: 30 %
Lymphs Abs: 1.3 10*3/uL (ref 0.7–4.0)
MCH: 33.7 pg (ref 26.0–34.0)
MCHC: 35 g/dL (ref 30.0–36.0)
MCV: 96.3 fL (ref 80.0–100.0)
Monocytes Absolute: 0.7 10*3/uL (ref 0.1–1.0)
Monocytes Relative: 15 %
Neutro Abs: 2.4 10*3/uL (ref 1.7–7.7)
Neutrophils Relative %: 52 %
Platelets: 236 10*3/uL (ref 150–400)
RBC: 3.47 MIL/uL — ABNORMAL LOW (ref 3.87–5.11)
RDW: 17.4 % — ABNORMAL HIGH (ref 11.5–15.5)
WBC: 4.5 10*3/uL (ref 4.0–10.5)
nRBC: 0 % (ref 0.0–0.2)

## 2018-09-01 LAB — COMPREHENSIVE METABOLIC PANEL
ALT: 23 U/L (ref 0–44)
AST: 21 U/L (ref 15–41)
Albumin: 3.6 g/dL (ref 3.5–5.0)
Alkaline Phosphatase: 35 U/L — ABNORMAL LOW (ref 38–126)
Anion gap: 9 (ref 5–15)
BUN: 17 mg/dL (ref 8–23)
CO2: 25 mmol/L (ref 22–32)
Calcium: 9.6 mg/dL (ref 8.9–10.3)
Chloride: 106 mmol/L (ref 98–111)
Creatinine, Ser: 1 mg/dL (ref 0.44–1.00)
GFR calc non Af Amer: 57 mL/min — ABNORMAL LOW (ref >60)
Glucose, Bld: 101 mg/dL — ABNORMAL HIGH (ref 70–99)
Potassium: 3.9 mmol/L (ref 3.5–5.1)
Sodium: 140 mmol/L (ref 135–145)
Total Bilirubin: 0.5 mg/dL (ref 0.3–1.2)
Total Protein: 7 g/dL (ref 6.5–8.1)

## 2018-09-01 MED ORDER — SODIUM CHLORIDE 0.9% FLUSH
10.0000 mL | Freq: Once | INTRAVENOUS | Status: AC
  Administered 2018-09-01: 10 mL
  Filled 2018-09-01: qty 10

## 2018-09-01 MED ORDER — HEPARIN SOD (PORK) LOCK FLUSH 100 UNIT/ML IV SOLN
500.0000 [IU] | Freq: Once | INTRAVENOUS | Status: AC
  Administered 2018-09-01: 500 [IU]
  Filled 2018-09-01: qty 5

## 2018-09-01 NOTE — Progress Notes (Signed)
[place] OFFICE PROGRESS NOTE  Patient Care Team: Gayland Curry, DO as PCP - General (Geriatric Medicine)  ASSESSMENT & PLAN:  Left ovarian epithelial cancer ([place]) So far, she tolerated treatment well without major side effects such as nausea, vomiting or peripheral neuropathy Per previous discussion, she will reduce oral premed dexamethasone before chemotherapy I will see her again prior to final treatment in 3 weeks  DVT of upper extremity (deep vein thrombosis) ([place]) She is doing well with anticoagulation therapy and we will continue Xarelto until completion of therapy and removal of port   Steroid-induced hyperglycemia She has profound hyperglycemia I recommend reducing oral premedication dexamethasone and dietary modification before and after each treatment.   No orders of the defined types were placed in this encounter.   INTERVAL HISTORY: Please see below for problem oriented charting. She returns for further follow-up Since last time I saw her, she feels well Denies peripheral neuropathy No recent infection, fever or chills Denies significant nausea or constipation. The patient denies any recent signs or symptoms of bleeding such as spontaneous epistaxis, hematuria or hematochezia.  SUMMARY OF ONCOLOGIC HISTORY: Oncology History   Surgery at [facility] Clear cell Positive for BRCA-1 on tumor block, negative blood test     Left ovarian epithelial cancer ([place])   03/17/2018 Initial Diagnosis    Presented to see primary doctor for weight loss and abdominal discomfort    04/29/2018 Imaging    1. Large pelvic mass with heterogeneous appearance, measuring at least 15 centimeters in diameter. Although the findings favor enlarged uterus/fibroids, ovaries are not well seen and an ovarian mass should also be considered. Further characterization with pelvic ultrasound is recommended. 2. The bladder is completely decompressed. There is LEFT-sided  hydronephrosis secondary to extrinsic compression from the pelvic mass. 3. Irregular thickening of the sigmoid colon, suspicious for colonic wall mass, measuring 1.3 centimeters. In this patient with a family history of colon cancer, further evaluation with colonoscopy is recommended. 4. Colonic diverticula without acute diverticulitis. 5. Moderate hiatal hernia.     05/15/2018 Imaging    MRI pelvis 16 cm complex cystic and solid mass which is centered in the left posterior adnexa and is contiguous with the uterus. This is suspicious for ovarian carcinoma, with differential diagnosis of atypical pedunculated fibroid with peripheral cystic degeneration. Surgical evaluation is recommended.  Other uterine fibroids measuring up to 4.8 cm.  No evidence of pelvic metastatic disease.     05/16/2018 Imaging    US pelvis 1. Large pelvic mass described in detail on MRI examination 1 day prior.  2. Slight ureteral prominence bilaterally. Mild fullness of the left renal collecting system. No obstructing focus evident. Suspect fullness of these structures due to ureteral compression by the sizable pelvic mass.  3.  Study otherwise unremarkable.     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, with necrosis  - Carcinoma is adherent to fallopian tube, consistent with surface involvement (stage pT1c2) - Focal endometriosis present - See synoptic report and comment  B: Uterus with cervix and right ovary and fallopian tube, hysterectomy and right salpingo-oophorectomy Cervix: Benign with Nabothian cysts Endometrium: Endometrial polyp (size 3.5 cm); inactive endometrium with cystic glandular changes Myometrium: Leiomyomata with hyalinization and calcification (  size  up to 4.2 cm); focal adenomyosis  Right ovary: Benign physiologic changes Right fallopian tube: Small paratubal cyst and no malignancy identified  C: Lymph nodes, right pelvic, regional resection - Nine lymph nodes with no metastatic carcinoma identified (0/9)  D: Lymph nodes, left pelvic, regional resection - Five lymph nodes with no metastatic carcinoma identified (0/5)  E: Cul-de-sac, anterior, biopsy - Fibroadipose tissue with no carcinoma identified  F: Cul-de-sac, posterior, biopsy - Fibrous tissue with inflammation and no carcinoma identified  G: Pelvic sidewall, right, biopsy - Fibroadipose tissue with focal crushed CD10 positive cells, cannot exclude endometrial type stroma - No carcinoma identified  H: Pelvic sidewall, left, biopsy - Fibroadipose tissue with focal crushed cells, cannot exclude endometrial type stroma - No carcinoma identified  I: Omentum, omentectomy - Adipose and fibrovascular tissue consistent with omentum - No carcinoma identified  J: Lymph nodes, left periaortic, regional resection - Two small lymph nodes with no metastatic carcinoma identified (0/2)  K: Lymph nodes, right periaortic, regional resection - Three small lymph nodes with no metastatic carcinoma identified (0/3)  This electronic signature is attestation that the pathologist personally reviewed the submitted material(s) and the final diagnosis reflects that evaluation.    Synoptic Report     OVARY or FALLOPIAN TUBE or PRIMARY PERITONEUM(Ovary FT Perit - All Specimens)    SPECIMEN  Procedure:Total hysterectomy and bilateral salpingo-oophorectomy   Procedure:Omentectomy   Procedure:Peritonealbiopsies   Procedure:Peritoneal washing   :  Specimen Integrity of Left Ovary:Received largely intact with multiple areas of disruption   Tumor Site:Left ovary   Histologic Type:Clear cell carcinoma   Histologic Grade:High grade    :  Tumor Size:Greatest dimension in Centimeters (cm): 14.0 Centimeters (cm)  Additional Dimension in Centimeters (cm):10 Centimeters (cm)  Additional Dimension in Centimeters (cm):7 Centimeters (cm)  Tumor Extent:  Ovarian Surface Involvement:Present   Laterality:Left   Other Tissue / Organ Involvement:Not identified   Peritoneal Ascitic Fluid:Negative for malignancy (normal / benign)   Pleural Fluid:Not submitted / unknown   Accessory Findings:  LYMPH NODES  Regional Lymph Nodes:All lymph nodes negative for tumor cells   Number of Lymph Nodes Examined:19   Site(s):Right pelvic: 9   Site(s):Left pelvic: 5   Site(s):Right para-aortic: 3   Site(s):Left para-aortic: 2   PATHOLOGIC STAGE CLASSIFICATION (pTNM, AJCC 8th Edition)  Primary Tumor (pT):pT1c2   Regional Lymph Nodes (pN):pN0   FIGO STAGE  FIGO Stage:IC2   ADDITIONAL FINDINGS  Additional Pathologic Findings:Endometriosis   Additional Pathologic Findings:Benign fallopian tube with adherence to carcinoma; also see additional parts   Comment(s)  Comment(s):Also see diagnosis comment.   The frozen section diagnosis is confirmed for specimen A. Sections demonstrate a carcinoma with tubulocystic and papillary architecture with hyalinized cores, lined by cells with high grade nuclei, clear to eosinophilic cytoplasm, and nuclear hobnailing.  The morphologic features are most suggestive of clear cell carcinoma.  Immunohistochemical stains are performed, and demonstrate that the carcinoma is positive for CK7 and PAX8, consistent with gynecologic origin. It has patchy positivity for Napsin A, wild type p53 staining, and is negative for ER, supporting the diagnosis of clear cell carcinoma.   Sections of the tumor demonstrate carcinoma with adherence to fallopian tube surface, consistent with surface involvement.  Per  the operative note, findings were also suggestive of preoperative rupture of the mass.  Given both these findings, the tumor is staged as a pT1c2 (FIGO IC2)             05/27/2018 Surgery  Preoperative Diagnoses: Pelvic mass  Postoperative Diagnoses: left fallopian tube and ovary consistent with high-grade carcinoma of likely GYN origin. No evidence of metastatic disease.  Procedures: Exploratory laparotomy, total abdominal hysterectomy, bilateral salpingo-oophorectomy, peritoneal biopsies for ovarian cancer staging, bilateral pelvic lymphadenectomy, bilateral para-aortic lymphadenectomy, infracolic omentectomy, R0 resection   Surgeon: Marti Sleigh, MD  Assistants: Tona Sensing, MD - Fellow, Haroldine Laws, MD - Resident, Feliberto Harts, PA-S  Findings: On BME, the cervix is retracted anteriorly with no palpable adnexal fullness. There is no RV septum nodularity. Abdominal exam reveals a mobile large mass that extends to the umbilicus and nearly to the pelvic side walls. On abdominal entry, there was immediate green-brown pelvic fluid that appeared consistent with a spontaneous rupture of the cystic fluid-filled mass. The left ovary was a large cystic complex mass, roughly 15cm in size with smooth surface but containing friable tumor. The mass was densely adherent along the entire posterior wall of the uterus. The uterus had some small intramural fibroids and was roughly 10cm in total size. The right fallopian tube and ovary were normal appearing. The patient has smooth paracolic gutters, liver edge, diaphragm, spleen and stomach. The omentum was retracted but no evidence of nodularity or gross tumor. The anterior cul-de-sac was clear and the posterior-cul-de-sac peritoneum was clear, but the left ovarian mass wall was adherent to the rectosigmoid and cul-de-sac. There were no palpable pelvic or para-aortic adenopathy.   Of note, an the conclusion of the case, there was mild RIGHT  hydronephrosis noted. The ureter was traced from the level of the right common iliac into the anterior utero-vesical ligament. There was an area at the pelvic brim were the hydronephrosis resolved to normal caliber. There was no tethering, stricture, or apparent injury to the ureter. Based on exam, this seemed consistent with packing and retraction causing some transient dilation that resolved with packing and retractor removal.   Intraoperative frozen section of the left ovary was consistent with high-grade carcinoma of GYN origin, possibly serous. There was no gross evidence of disease at the conclusion of the case, R0 resection.  Specimens: 1) Pelvic washings 2) Uterus with cervix  3) Left ovary and fallopian tube, IOFS c/w high-grade GYN carcinoma 4) Right fallopian tube and ovary 5) Peritoneal biopsies - anterior cul-de-sac peritoneum, posterior cul-de-sac peritoneum, left pelvic side wall peritoneum, right pelvic side wall peritoneum 6) Right and left pelvic lymph nodes 7) Right and left para-aortic lymph nodes 8) Omentum 9) Anaerobic/Aerobic culture - preliminary read is no organisms     05/27/2018 Genetic Testing    Patient has genetic testing done for genetic disorder Results revealed patient has the following mutation(s): BRCA 1 on tissue block, neg blood    06/04/2018 Procedure    She had port placement    06/05/2018 Cancer Staging    Staging form: Ovary, Fallopian Tube, and Primary Peritoneal Carcinoma, AJCC 8th Edition - Pathologic: FIGO Stage IC2, calculated as Stage IC (pT1c2, pN0, cM0) - Signed by Heath Lark, MD on 06/05/2018    06/27/2018 Imaging    1. No evidence of residual/recurrent tumor in the pelvis. 2. No evidence of metastatic disease in the chest, abdomen or pelvis. 3. Left pelvic sidewall 6.3 x 5.0 cm simple fluid collection, compatible with postsurgical seroma or lymphocele. 4. Small hiatal hernia. 5. Aortic Atherosclerosis (ICD10-I70.0).    06/27/2018  Tumor Marker    Patient's tumor was tested for the following markers: CA-125 Results of the tumor marker test revealed 90.3    07/15/2018  Imaging    Vascular US Findings consistent with acute deep vein thrombosis involving the right internal jugular veins. She is placed on Xarelto    07/21/2018 Tumor Marker    Patient's tumor was tested for the following markers: CA-125 Results of the tumor marker test revealed 22.8     REVIEW OF SYSTEMS:   Constitutional: Denies fevers, chills or abnormal weight loss Eyes: Denies blurriness of vision Ears, nose, mouth, throat, and face: Denies mucositis or sore throat Respiratory: Denies cough, dyspnea or wheezes Cardiovascular: Denies palpitation, chest discomfort or lower extremity swelling Gastrointestinal:  Denies nausea, heartburn or change in bowel habits Skin: Denies abnormal skin rashes Lymphatics: Denies new lymphadenopathy or easy bruising Neurological:Denies numbness, tingling or new weaknesses Behavioral/Psych: Mood is stable, no new changes  All other systems were reviewed with the patient and are negative.  I have reviewed the past medical history, past surgical history, social history and family history with the patient and they are unchanged from previous note.  ALLERGIES:  has No Known Allergies.  MEDICATIONS:  Current Outpatient Medications  Medication Sig Dispense Refill  . acetaminophen (TYLENOL) 325 MG tablet Take 650 mg by mouth every 6 (six) hours as needed.    Marland Kitchen alendronate (FOSAMAX) 70 MG tablet TAKE 1 TABLET EVERY WEEK 12 tablet 3  . dexamethasone (DECADRON) 4 MG tablet Take 2 tabs at the night before and 2 tabs the morning of chemotherapy every 3 weeks by mouth.    . lidocaine-prilocaine (EMLA) cream Apply to affected area once 30 g 3  . losartan-hydrochlorothiazide (HYZAAR) 100-12.5 MG tablet Take 1 tablet by mouth daily. (Patient taking differently: Take 1 tablet by mouth daily. Hold the medication for 1 week after  chemotherapy treatments.) 90 tablet 3  . metoprolol tartrate (LOPRESSOR) 50 MG tablet TAKE 1 TABLET TWICE DAILY 180 tablet 1  . Multiple Vitamin (MULTIVITAMIN) tablet Take 1 tablet by mouth 2 (two) times daily.     Marland Kitchen omeprazole (PRILOSEC) 20 MG capsule Take 1 capsule (20 mg total) by mouth daily. 90 capsule 3  . ondansetron (ZOFRAN) 8 MG tablet Take 1 tablet (8 mg total) by mouth every 8 (eight) hours as needed for refractory nausea / vomiting. Start on day 3 after chemo. (Patient not taking: Reported on 08/19/2018) 30 tablet 1  . prochlorperazine (COMPAZINE) 10 MG tablet TAKE 1 TABLET(10 MG) BY MOUTH EVERY 6 HOURS AS NEEDED FOR NAUSEA OR VOMITING (Patient not taking: Reported on 08/19/2018) 385 tablet 0  . rivaroxaban (XARELTO) 20 MG TABS tablet Take 1 tablet (20 mg total) by mouth daily with supper. 90 tablet 9  . simvastatin (ZOCOR) 20 MG tablet Take 1 tablet (20 mg total) by mouth daily. 90 tablet 3   No current facility-administered medications for this visit.     PHYSICAL EXAMINATION: ECOG PERFORMANCE STATUS: 1 - Symptomatic but completely ambulatory  Vitals:   09/01/18 1117  BP: (!) 131/93  Pulse: 60  Resp: 18  Temp: 98.2 F (36.8 C)  SpO2: 98%   Filed Weights   09/01/18 1117  Weight: 134 lb 12.8 oz (61.1 kg)    GENERAL:alert, no distress and comfortable SKIN: skin color, texture, turgor are normal, no rashes or significant lesions EYES: normal, Conjunctiva are pink and non-injected, sclera clear OROPHARYNX:no exudate, no erythema and lips, buccal mucosa, and tongue normal  NECK: supple, thyroid normal size, non-tender, without nodularity LYMPH:  no palpable lymphadenopathy in the cervical, axillary or inguinal LUNGS: clear to auscultation and percussion with normal breathing  effort HEART: regular rate & rhythm and no murmurs and no lower extremity edema ABDOMEN:abdomen soft, non-tender and normal bowel sounds Musculoskeletal:no cyanosis of digits and no clubbing  NEURO:  alert & oriented x 3 with fluent speech, no focal motor/sensory deficits  LABORATORY DATA:  I have reviewed the data as listed    Component Value Date/Time   NA 140 09/01/2018 1021   NA 140 02/29/2016 1027   K 3.9 09/01/2018 1021   CL 106 09/01/2018 1021   CO2 25 09/01/2018 1021   GLUCOSE 101 (H) 09/01/2018 1021   BUN 17 09/01/2018 1021   BUN 17 02/29/2016 1027   CREATININE 1.00 09/01/2018 1021   CREATININE 1.11 (H) 03/12/2018 0906   CALCIUM 9.6 09/01/2018 1021   PROT 7.0 09/01/2018 1021   PROT 7.0 02/29/2016 1027   ALBUMIN 3.6 09/01/2018 1021   ALBUMIN 4.3 02/29/2016 1027   AST 21 09/01/2018 1021   ALT 23 09/01/2018 1021   ALKPHOS 35 (L) 09/01/2018 1021   BILITOT 0.5 09/01/2018 1021   BILITOT 0.9 02/29/2016 1027   GFRNONAA 57 (L) 09/01/2018 1021   GFRNONAA 51 (L) 03/12/2018 0906   GFRAA >60 09/01/2018 1021   GFRAA 59 (L) 03/12/2018 0906    No results found for: SPEP, UPEP  Lab Results  Component Value Date   WBC 4.5 09/01/2018   NEUTROABS 2.4 09/01/2018   HGB 11.7 (L) 09/01/2018   HCT 33.4 (L) 09/01/2018   MCV 96.3 09/01/2018   PLT 236 09/01/2018      Chemistry      Component Value Date/Time   NA 140 09/01/2018 1021   NA 140 02/29/2016 1027   K 3.9 09/01/2018 1021   CL 106 09/01/2018 1021   CO2 25 09/01/2018 1021   BUN 17 09/01/2018 1021   BUN 17 02/29/2016 1027   CREATININE 1.00 09/01/2018 1021   CREATININE 1.11 (H) 03/12/2018 0906      Component Value Date/Time   CALCIUM 9.6 09/01/2018 1021   ALKPHOS 35 (L) 09/01/2018 1021   AST 21 09/01/2018 1021   ALT 23 09/01/2018 1021   BILITOT 0.5 09/01/2018 1021   BILITOT 0.9 02/29/2016 1027      All questions were answered. The patient knows to call the clinic with any problems, questions or concerns. No barriers to learning was detected.  I spent 15 minutes counseling the patient face to face. The total time spent in the appointment was 20 minutes and more than 50% was on counseling and review of test  results  Heath Lark, MD 09/01/2018 12:02 PM

## 2018-09-01 NOTE — Assessment & Plan Note (Signed)
So far, she tolerated treatment well without major side effects such as nausea, vomiting or peripheral neuropathy Per previous discussion, she will reduce oral premed dexamethasone before chemotherapy I will see her again prior to final treatment in 3 weeks

## 2018-09-01 NOTE — Assessment & Plan Note (Signed)
She is doing well with anticoagulation therapy and we will continue Xarelto until completion of therapy and removal of port

## 2018-09-01 NOTE — Assessment & Plan Note (Signed)
She has profound hyperglycemia I recommend reducing oral premedication dexamethasone and dietary modification before and after each treatment.

## 2018-09-01 NOTE — Telephone Encounter (Signed)
Per 12/23 no new orders

## 2018-09-02 ENCOUNTER — Inpatient Hospital Stay: Payer: Medicare HMO

## 2018-09-02 VITALS — BP 137/83 | HR 67 | Temp 97.7°F | Resp 16

## 2018-09-02 DIAGNOSIS — C562 Malignant neoplasm of left ovary: Secondary | ICD-10-CM | POA: Diagnosis not present

## 2018-09-02 DIAGNOSIS — R739 Hyperglycemia, unspecified: Secondary | ICD-10-CM | POA: Diagnosis not present

## 2018-09-02 DIAGNOSIS — Z90722 Acquired absence of ovaries, bilateral: Secondary | ICD-10-CM | POA: Diagnosis not present

## 2018-09-02 DIAGNOSIS — I82621 Acute embolism and thrombosis of deep veins of right upper extremity: Secondary | ICD-10-CM | POA: Diagnosis not present

## 2018-09-02 DIAGNOSIS — Z90710 Acquired absence of both cervix and uterus: Secondary | ICD-10-CM | POA: Diagnosis not present

## 2018-09-02 DIAGNOSIS — Z5111 Encounter for antineoplastic chemotherapy: Secondary | ICD-10-CM | POA: Diagnosis not present

## 2018-09-02 DIAGNOSIS — K219 Gastro-esophageal reflux disease without esophagitis: Secondary | ICD-10-CM | POA: Diagnosis not present

## 2018-09-02 DIAGNOSIS — I10 Essential (primary) hypertension: Secondary | ICD-10-CM | POA: Diagnosis not present

## 2018-09-02 DIAGNOSIS — E785 Hyperlipidemia, unspecified: Secondary | ICD-10-CM | POA: Diagnosis not present

## 2018-09-02 MED ORDER — PALONOSETRON HCL INJECTION 0.25 MG/5ML
INTRAVENOUS | Status: AC
  Filled 2018-09-02: qty 5

## 2018-09-02 MED ORDER — SODIUM CHLORIDE 0.9 % IV SOLN
444.6000 mg | Freq: Once | INTRAVENOUS | Status: AC
  Administered 2018-09-02: 440 mg via INTRAVENOUS
  Filled 2018-09-02: qty 44

## 2018-09-02 MED ORDER — PALONOSETRON HCL INJECTION 0.25 MG/5ML
0.2500 mg | Freq: Once | INTRAVENOUS | Status: AC
  Administered 2018-09-02: 0.25 mg via INTRAVENOUS

## 2018-09-02 MED ORDER — DIPHENHYDRAMINE HCL 50 MG/ML IJ SOLN
INTRAMUSCULAR | Status: AC
  Filled 2018-09-02: qty 1

## 2018-09-02 MED ORDER — FAMOTIDINE IN NACL 20-0.9 MG/50ML-% IV SOLN
INTRAVENOUS | Status: AC
  Filled 2018-09-02: qty 50

## 2018-09-02 MED ORDER — SODIUM CHLORIDE 0.9% FLUSH
10.0000 mL | INTRAVENOUS | Status: DC | PRN
  Administered 2018-09-02: 10 mL
  Filled 2018-09-02: qty 10

## 2018-09-02 MED ORDER — SODIUM CHLORIDE 0.9 % IV SOLN
Freq: Once | INTRAVENOUS | Status: AC
  Administered 2018-09-02: 09:00:00 via INTRAVENOUS
  Filled 2018-09-02: qty 5

## 2018-09-02 MED ORDER — SODIUM CHLORIDE 0.9 % IV SOLN
Freq: Once | INTRAVENOUS | Status: AC
  Administered 2018-09-02: 08:00:00 via INTRAVENOUS
  Filled 2018-09-02: qty 250

## 2018-09-02 MED ORDER — FAMOTIDINE IN NACL 20-0.9 MG/50ML-% IV SOLN
20.0000 mg | Freq: Once | INTRAVENOUS | Status: AC
  Administered 2018-09-02: 20 mg via INTRAVENOUS

## 2018-09-02 MED ORDER — HEPARIN SOD (PORK) LOCK FLUSH 100 UNIT/ML IV SOLN
500.0000 [IU] | Freq: Once | INTRAVENOUS | Status: AC | PRN
  Administered 2018-09-02: 500 [IU]
  Filled 2018-09-02: qty 5

## 2018-09-02 MED ORDER — SODIUM CHLORIDE 0.9 % IV SOLN
175.0000 mg/m2 | Freq: Once | INTRAVENOUS | Status: AC
  Administered 2018-09-02: 270 mg via INTRAVENOUS
  Filled 2018-09-02: qty 45

## 2018-09-02 MED ORDER — DIPHENHYDRAMINE HCL 50 MG/ML IJ SOLN: 50.0000 mg | Freq: Once | INTRAMUSCULAR | Status: DC

## 2018-09-02 NOTE — Patient Instructions (Signed)
[facility] Discharge Instructions for Patients Receiving Chemotherapy  Today you received the following chemotherapy agents Paclitaxel (Taxol) & Carboplatin (Paraplatin)  To help prevent nausea and vomiting after your treatment, we encourage you to take your nausea medication as prescribed.  If you develop nausea and vomiting that is not controlled by your nausea medication, call the clinic.   BELOW ARE SYMPTOMS THAT SHOULD BE REPORTED IMMEDIATELY:  *FEVER GREATER THAN 100.5 F  *CHILLS WITH OR WITHOUT FEVER  NAUSEA AND VOMITING THAT IS NOT CONTROLLED WITH YOUR NAUSEA MEDICATION  *UNUSUAL SHORTNESS OF BREATH  *UNUSUAL BRUISING OR BLEEDING  TENDERNESS IN MOUTH AND THROAT WITH OR WITHOUT PRESENCE OF ULCERS  *URINARY PROBLEMS  *BOWEL PROBLEMS  UNUSUAL RASH Items with * indicate a potential emergency and should be followed up as soon as possible.  Feel free to call the clinic should you have any questions or concerns. The clinic phone number is [phone].  Please show the CHEMO ALERT CARD at check-in to the Emergency Department and triage nurse.   

## 2018-09-05 ENCOUNTER — Other Ambulatory Visit: Payer: Self-pay | Admitting: Internal Medicine

## 2018-09-15 ENCOUNTER — Ambulatory Visit (INDEPENDENT_AMBULATORY_CARE_PROVIDER_SITE_OTHER): Payer: Medicare HMO | Admitting: Nurse Practitioner

## 2018-09-15 ENCOUNTER — Encounter: Payer: Self-pay | Admitting: Nurse Practitioner

## 2018-09-15 ENCOUNTER — Ambulatory Visit: Payer: Self-pay

## 2018-09-15 VITALS — BP 138/80 | HR 63 | Temp 97.8°F | Ht <= 58 in | Wt 133.2 lb

## 2018-09-15 DIAGNOSIS — I10 Essential (primary) hypertension: Secondary | ICD-10-CM

## 2018-09-15 DIAGNOSIS — E785 Hyperlipidemia, unspecified: Secondary | ICD-10-CM | POA: Diagnosis not present

## 2018-09-15 DIAGNOSIS — W57XXXA Bitten or stung by nonvenomous insect and other nonvenomous arthropods, initial encounter: Secondary | ICD-10-CM | POA: Diagnosis not present

## 2018-09-15 DIAGNOSIS — R059 Cough, unspecified: Secondary | ICD-10-CM

## 2018-09-15 DIAGNOSIS — E2839 Other primary ovarian failure: Secondary | ICD-10-CM

## 2018-09-15 DIAGNOSIS — R739 Hyperglycemia, unspecified: Secondary | ICD-10-CM

## 2018-09-15 DIAGNOSIS — R05 Cough: Secondary | ICD-10-CM | POA: Diagnosis not present

## 2018-09-15 DIAGNOSIS — R630 Anorexia: Secondary | ICD-10-CM | POA: Diagnosis not present

## 2018-09-15 DIAGNOSIS — R102 Pelvic and perineal pain: Secondary | ICD-10-CM

## 2018-09-15 DIAGNOSIS — Z0000 Encounter for general adult medical examination without abnormal findings: Secondary | ICD-10-CM | POA: Diagnosis not present

## 2018-09-15 MED ORDER — ZOSTER VAC RECOMB ADJUVANTED 50 MCG/0.5ML IM SUSR: 0.5000 mL | Freq: Once | INTRAMUSCULAR | 1 refills | Status: AC

## 2018-09-15 NOTE — Patient Instructions (Signed)
Melissa Simpson , Thank you for taking time to come for your Medicare Wellness Visit. I appreciate your ongoing commitment to your health goals. Please review the following plan we discussed and let me know if I can assist you in the future.   Screening recommendations/referrals: Colonoscopy utd Mammogram utd Bone Density ordered today Recommended yearly ophthalmology/optometry visit for glaucoma screening and checkup Recommended yearly dental visit for hygiene and checkup  Vaccinations: Shingles vaccine sent to pharmacy   Advanced directives: please complete form and bring back to office  Conditions/risks identified: increase physical activity as tolerates    Preventive Care 65 Years and Older, Female Preventive care refers to lifestyle choices and visits with your health care provider that can promote health and wellness. What does preventive care include?  A yearly physical exam. This is also called an annual well check.  Dental exams once or twice a year.  Routine eye exams. Ask your health care provider how often you should have your eyes checked.  Personal lifestyle choices, including:  Daily care of your teeth and gums.  Regular physical activity.  Eating a healthy diet.  Avoiding tobacco and drug use.  Limiting alcohol use.  Practicing safe sex.  Taking low-dose aspirin every day.  Taking vitamin and mineral supplements as recommended by your health care provider. What happens during an annual well check? The services and screenings done by your health care provider during your annual well check will depend on your age, overall health, lifestyle risk factors, and family history of disease. Counseling  Your health care provider may ask you questions about your:  Alcohol use.  Tobacco use.  Drug use.  Emotional well-being.  Home and relationship well-being.  Sexual activity.  Eating habits.  History of falls.  Memory and ability to understand  (cognition).  Work and work Statistician.  Reproductive health. Screening  You may have the following tests or measurements:  Height, weight, and BMI.  Blood pressure.  Lipid and cholesterol levels. These may be checked every 5 years, or more frequently if you are over 9 years old.  Skin check.  Lung cancer screening. You may have this screening every year starting at age 24 if you have a 30-pack-year history of smoking and currently smoke or have quit within the past 15 years.  Fecal occult blood test (FOBT) of the stool. You may have this test every year starting at age 50.  Flexible sigmoidoscopy or colonoscopy. You may have a sigmoidoscopy every 5 years or a colonoscopy every 10 years starting at age 58.  Hepatitis C blood test.  Hepatitis B blood test.  Sexually transmitted disease (STD) testing.  Diabetes screening. This is done by checking your blood sugar (glucose) after you have not eaten for a while (fasting). You may have this done every 1-3 years.  Bone density scan. This is done to screen for osteoporosis. You may have this done starting at age 12.  Mammogram. This may be done every 1-2 years. Talk to your health care provider about how often you should have regular mammograms. Talk with your health care provider about your test results, treatment options, and if necessary, the need for more tests. Vaccines  Your health care provider may recommend certain vaccines, such as:  Influenza vaccine. This is recommended every year.  Tetanus, diphtheria, and acellular pertussis (Tdap, Td) vaccine. You may need a Td booster every 10 years.  Zoster vaccine. You may need this after age 98.  Pneumococcal 13-valent conjugate (PCV13) vaccine.  One dose is recommended after age 26.  Pneumococcal polysaccharide (PPSV23) vaccine. One dose is recommended after age 29. Talk to your health care provider about which screenings and vaccines you need and how often you need  them. This information is not intended to replace advice given to you by your health care provider. Make sure you discuss any questions you have with your health care provider. Document Released: 09/23/2015 Document Revised: 05/16/2016 Document Reviewed: 06/28/2015 Elsevier Interactive Patient Education  2017 [place] Prevention in the Home Falls can cause injuries. They can happen to people of all ages. There are many things you can do to make your home safe and to help prevent falls. What can I do on the outside of my home?  Regularly fix the edges of walkways and driveways and fix any cracks.  Remove anything that might make you trip as you walk through a door, such as a raised step or threshold.  Trim any bushes or trees on the path to your home.  Use bright outdoor lighting.  Clear any walking paths of anything that might make someone trip, such as rocks or tools.  Regularly check to see if handrails are loose or broken. Make sure that both sides of any steps have handrails.  Any raised decks and porches should have guardrails on the edges.  Have any leaves, snow, or ice cleared regularly.  Use sand or salt on walking paths during winter.  Clean up any spills in your garage right away. This includes oil or grease spills. What can I do in the bathroom?  Use night lights.  Install grab bars by the toilet and in the tub and shower. Do not use towel bars as grab bars.  Use non-skid mats or decals in the tub or shower.  If you need to sit down in the shower, use a plastic, non-slip stool.  Keep the floor dry. Clean up any water that spills on the floor as soon as it happens.  Remove soap buildup in the tub or shower regularly.  Attach bath mats securely with double-sided non-slip rug tape.  Do not have throw rugs and other things on the floor that can make you trip. What can I do in the bedroom?  Use night lights.  Make sure that you have a light by your  bed that is easy to reach.  Do not use any sheets or blankets that are too big for your bed. They should not hang down onto the floor.  Have a firm chair that has side arms. You can use this for support while you get dressed.  Do not have throw rugs and other things on the floor that can make you trip. What can I do in the kitchen?  Clean up any spills right away.  Avoid walking on wet floors.  Keep items that you use a lot in easy-to-reach places.  If you need to reach something above you, use a strong step stool that has a grab bar.  Keep electrical cords out of the way.  Do not use floor polish or wax that makes floors slippery. If you must use wax, use non-skid floor wax.  Do not have throw rugs and other things on the floor that can make you trip. What can I do with my stairs?  Do not leave any items on the stairs.  Make sure that there are handrails on both sides of the stairs and use them. Fix handrails that are broken  or loose. Make sure that handrails are as long as the stairways.  Check any carpeting to make sure that it is firmly attached to the stairs. Fix any carpet that is loose or worn.  Avoid having throw rugs at the top or bottom of the stairs. If you do have throw rugs, attach them to the floor with carpet tape.  Make sure that you have a light switch at the top of the stairs and the bottom of the stairs. If you do not have them, ask someone to add them for you. What else can I do to help prevent falls?  Wear shoes that:  Do not have high heels.  Have rubber bottoms.  Are comfortable and fit you well.  Are closed at the toe. Do not wear sandals.  If you use a stepladder:  Make sure that it is fully opened. Do not climb a closed stepladder.  Make sure that both sides of the stepladder are locked into place.  Ask someone to hold it for you, if possible.  Clearly mark and make sure that you can see:  Any grab bars or handrails.  First and last  steps.  Where the edge of each step is.  Use tools that help you move around (mobility aids) if they are needed. These include:  Canes.  Walkers.  Scooters.  Crutches.  Turn on the lights when you go into a dark area. Replace any light bulbs as soon as they burn out.  Set up your furniture so you have a clear path. Avoid moving your furniture around.  If any of your floors are uneven, fix them.  If there are any pets around you, be aware of where they are.  Review your medicines with your doctor. Some medicines can make you feel dizzy. This can increase your chance of falling. Ask your doctor what other things that you can do to help prevent falls. This information is not intended to replace advice given to you by your health care provider. Make sure you discuss any questions you have with your health care provider. Document Released: 06/23/2009 Document Revised: 02/02/2016 Document Reviewed: 10/01/2014 Elsevier Interactive Patient Education  2017 [employer].

## 2018-09-15 NOTE — Progress Notes (Signed)
Subjective:   Melissa Simpson is a 68 y.o. female who presents for Medicare Annual (Subsequent) preventive examination.  Review of Systems:  Cardiac Risk Factors include: dyslipidemia;hypertension;sedentary lifestyle     Objective:     Vitals: BP 138/80   Pulse 63   Temp 97.8 F (36.6 C) (Oral)   Ht 4\' 10"  (1.473 m)   Wt 133 lb 3.2 oz (60.4 kg)   SpO2 93%   BMI 27.84 kg/m   Body mass index is 27.84 kg/m.  Advanced Directives 09/15/2018 08/19/2018 07/15/2018 05/16/2018 05/09/2018 03/17/2018 09/05/2017  Does Patient Have a Medical Advance Directive? No No No No No No No  Would patient like information on creating a medical advance directive? Yes (MAU/Ambulatory/Procedural Areas - Information given) No - Patient declined - No - Patient declined No - Patient declined No - Patient declined No - Patient declined    Tobacco Social History   Tobacco Use  Smoking Status Never Smoker  Smokeless Tobacco Never Used     Counseling given: Not Answered   Clinical Intake:  Pre-visit preparation completed: No  Pain : No/denies pain Pain Score: 0-No pain     BMI - recorded: 27.84 Nutritional Status: BMI 25 -29 Overweight Nutritional Risks: Other (Comment)(chemotherapy) Diabetes: No  How often do you need to have someone help you when you read instructions, pamphlets, or other written materials from your doctor or pharmacy?: 1 - Never What is the last grade level you completed in school?: some college  Interpreter Needed?: No     MMSE - [place] Exam 09/15/2018 09/05/2017 08/30/2016  Orientation to time 5 5 5   Orientation to Place 5 5 5   Registration 3 3 3   Attention/ Calculation 5 5 5   Recall 3 2 3   Language- name 2 objects 2 2 2   Language- repeat 1 1 1   Language- follow 3 step command 3 3 3   Language- read & follow direction 1 1 1   Write a sentence 1 1 1   Copy design 1 1 1   Total score 30 29 30      Past Medical History:  Diagnosis Date  . Cancer ([place])    left ovarian cancer  . Family history of colon cancer   . Family history of ovarian cancer   . GERD (gastroesophageal reflux disease)   . Hyperlipidemia   . Hypertension    Past Surgical History:  Procedure Laterality Date  . ABDOMINAL HYSTERECTOMY    . BIOPSY THYROID  2008  . COLONOSCOPY    . IR CV LINE INJECTION  07/15/2018  . TONSILECTOMY, ADENOIDECTOMY, BILATERAL MYRINGOTOMY AND TUBES Bilateral 1957   Family History  Problem Relation Age of Onset  . Hypertension Father   . Diabetes Father   . Coronary artery disease Father   . Hyperlipidemia Father   . Colon cancer Father        11's  . Hypertension Mother   . Arthritis Mother   . Cancer Maternal Aunt        type unk  . Ovarian cancer Cousin 17  . Cancer Cousin        mouth   Social History   Socioeconomic History  . Marital status: Married    Spouse name: Louie Casa  . Number of children: 3  . Years of education: Not on file  . Highest education level: Not on file  Occupational History  . Occupation: retired Solicitor  Social Needs  . Financial resource strain: Not hard at all  .  Food insecurity:    Worry: Never true    Inability: Never true  . Transportation needs:    Medical: No    Non-medical: No  Tobacco Use  . Smoking status: Never Smoker  . Smokeless tobacco: Never Used  Substance and Sexual Activity  . Alcohol use: No  . Drug use: No  . Sexual activity: Not on file  Lifestyle  . Physical activity:    Days per week: 0 days    Minutes per session: 0 min  . Stress: Only a little  Relationships  . Social connections:    Talks on phone: Never    Gets together: More than three times a week    Attends religious service: 1 to 4 times per year    Active member of club or organization: No    Attends meetings of clubs or organizations: Never    Relationship status: Married  Other Topics Concern  . Not on file  Social History Narrative   Diet:Unrestricted   Do you drink/eat things with  caffeine? Rarely   Marital status: Married                             What year were you married? 1992   Do you live in a house, apartment, assisted living, condo, trailer, etc)? House   Is it one or more stories? 1   How many persons live in your home? 3   Do you have any pets in your home?  2 small dogs   Current or past profession: Chief of Staff   Do you exercise?                                                     Type & how often:   Do you have a living will? No   Do you have a DNR Form? No   Do you have a POA/HPOA forms? NO    Outpatient Encounter Medications as of 09/15/2018  Medication Sig  . acetaminophen (TYLENOL) 500 MG tablet Take 1,000 mg by mouth every 6 (six) hours as needed.  Marland Kitchen alendronate (FOSAMAX) 70 MG tablet TAKE 1 TABLET EVERY WEEK  . BLACK COHOSH PO Take 1 capsule by mouth daily.  Marland Kitchen dexamethasone (DECADRON) 4 MG tablet Take 2 tabs at the night before and 2 tabs the morning of chemotherapy every 3 weeks by mouth.  . fenoprofen (NALFON) 600 MG TABS tablet Take 600 mg by mouth 2 (two) times daily.  . Glucosamine HCl ([facility] GLUCOSAMINE PO) 2 tabs by mouth in the morning and 1 tab by mouth at night  . lidocaine-prilocaine (EMLA) cream Apply to affected area once  . losartan-hydrochlorothiazide (HYZAAR) 100-12.5 MG tablet Take 1 tablet by mouth daily. (Patient taking differently: Take 1 tablet by mouth daily. Hold the medication for 1 week after chemotherapy treatments.)  . metoprolol tartrate (LOPRESSOR) 50 MG tablet TAKE 1 TABLET TWICE DAILY  . Multiple Vitamin (MULTIVITAMIN) tablet Take 1 tablet by mouth 2 (two) times daily.   . Omega-3 Fatty Acids (OMEGA-3 FISH OIL PO) Take 1 tablet by mouth 2 (two) times daily.  Marland Kitchen omeprazole (PRILOSEC) 20 MG capsule Take 1 capsule (20 mg total) by mouth daily.  . rivaroxaban (XARELTO) 20 MG TABS tablet Take 1 tablet (20 mg total) by  mouth daily with supper.  . simvastatin (ZOCOR) 20 MG tablet Take 1 tablet (20 mg total) by mouth  daily.  . ondansetron (ZOFRAN) 8 MG tablet Take 1 tablet (8 mg total) by mouth every 8 (eight) hours as needed for refractory nausea / vomiting. Start on day 3 after chemo. (Patient not taking: Reported on 08/19/2018)  . prochlorperazine (COMPAZINE) 10 MG tablet TAKE 1 TABLET(10 MG) BY MOUTH EVERY 6 HOURS AS NEEDED FOR NAUSEA OR VOMITING (Patient not taking: Reported on 08/19/2018)  . [DISCONTINUED] acetaminophen (TYLENOL) 325 MG tablet Take 650 mg by mouth every 6 (six) hours as needed.   No facility-administered encounter medications on file as of 09/15/2018.     Activities of Daily Living In your present state of health, do you have any difficulty performing the following activities: 09/15/2018  Hearing? N  Vision? N  Difficulty concentrating or making decisions? N  Walking or climbing stairs? N  Dressing or bathing? N  Doing errands, shopping? N  Preparing Food and eating ? N  Using the Toilet? N  In the past six months, have you accidently leaked urine? Y  Do you have problems with loss of bowel control? Y  Comment after chemo  Managing your Medications? N  Managing your Finances? N  Housekeeping or managing your Housekeeping? N  Some recent data might be hidden    Patient Care Team: Gayland Curry, DO as PCP - General (Geriatric Medicine)    Assessment:   This is a routine wellness examination for Jolanda.  Exercise Activities and Dietary recommendations Current Exercise Habits: The patient does not participate in regular exercise at present, Exercise limited by: Other - see comments;orthopedic condition(s)  Goals   None     Fall Risk Fall Risk  09/15/2018 03/17/2018 11/05/2017 09/05/2017 08/21/2017  Falls in the past year? 0 No No No No  Number falls in past yr: 0 - - - -  Injury with Fall? 0 - - - -   Is the patient's home free of loose throw rugs in walkways, pet beds, electrical cords, etc?   yes      Grab bars in the bathroom? yes      Handrails on the stairs?    n/a      Adequate lighting?   no  Depression Screen PHQ 2/9 Scores 03/17/2018 09/05/2017 08/21/2017 02/28/2017  PHQ - 2 Score 0 0 0 0     Cognitive Function MMSE - Mini Mental [place] Exam 09/15/2018 09/05/2017 08/30/2016  Orientation to time 5 5 5   Orientation to Place 5 5 5   Registration 3 3 3   Attention/ Calculation 5 5 5   Recall 3 2 3   Language- name 2 objects 2 2 2   Language- repeat 1 1 1   Language- follow 3 step command 3 3 3   Language- read & follow direction 1 1 1   Write a sentence 1 1 1   Copy design 1 1 1   Total score 30 29 30         Immunization History  Administered Date(s) Administered  . Influenza, High Dose Seasonal PF 06/10/2017, 06/09/2018  . Influenza-Unspecified 06/11/2014, 05/27/2015, 06/27/2016  . Pneumococcal Conjugate-13 07/06/2014  . Pneumococcal Polysaccharide-23 08/29/2015  . Tdap 06/23/2012    Qualifies for Shingles Vaccine? Yes.   Screening Tests Health Maintenance  Topic Date Due  . MAMMOGRAM  08/29/2019  . TETANUS/TDAP  06/23/2022  . COLONOSCOPY  11/06/2026  . INFLUENZA VACCINE  Completed  . DEXA SCAN  Completed  . Hepatitis  C Screening  Completed  . PNA vac Low Risk Adult  Completed    Cancer Screenings: Lung: Low Dose CT Chest recommended if Age 30-80 years, 30 pack-year currently smoking OR have quit w/in 15years. Patient does not qualify. Breast:  Up to date on Mammogram? Yes   Up to date of Bone Density/Dexa? No Colorectal: colonoscopy in 2018      Additional Screenings: Hepatitis C Screening: DONE WILL ORDER BONE DENSITY      Plan:      I have personally reviewed and noted the following in the patient's chart:   . Medical and social history . Use of alcohol, tobacco or illicit drugs  . Current medications and supplements . Functional ability and status . Nutritional status . Physical activity . Advanced directives . List of other physicians . Hospitalizations, surgeries, and ER visits in previous 12  months . Vitals . Screenings to include cognitive, depression, and falls . Referrals and appointments  In addition, I have reviewed and discussed with patient certain preventive protocols, quality metrics, and best practice recommendations. A written personalized care plan for preventive services as well as general preventive health recommendations were provided to patient.     Lauree Chandler, NP  09/15/2018

## 2018-09-16 LAB — COMPLETE METABOLIC PANEL WITH GFR
AG Ratio: 1.3 (calc) (ref 1.0–2.5)
ALT: 50 U/L — ABNORMAL HIGH (ref 6–29)
AST: 38 U/L — ABNORMAL HIGH (ref 10–35)
Albumin: 4 g/dL (ref 3.6–5.1)
Alkaline phosphatase (APISO): 53 U/L (ref 33–130)
BUN/Creatinine Ratio: 18 (calc) (ref 6–22)
BUN: 19 mg/dL (ref 7–25)
CO2: 26 mmol/L (ref 20–32)
Calcium: 9.3 mg/dL (ref 8.6–10.4)
Chloride: 103 mmol/L (ref 98–110)
Creat: 1.08 mg/dL — ABNORMAL HIGH (ref 0.50–0.99)
GFR, Est African American: 61 mL/min/{1.73_m2} (ref >=60)
GFR, Est Non African American: 52 mL/min/{1.73_m2} — ABNORMAL LOW (ref >=60)
Globulin: 3 g/dL (calc) (ref 1.9–3.7)
Glucose, Bld: 100 mg/dL — ABNORMAL HIGH (ref 65–99)
Potassium: 4.5 mmol/L (ref 3.5–5.3)
Sodium: 139 mmol/L (ref 135–146)
Total Bilirubin: 0.5 mg/dL (ref 0.2–1.2)
Total Protein: 7 g/dL (ref 6.1–8.1)

## 2018-09-16 LAB — CBC WITH DIFFERENTIAL/PLATELET
Absolute Monocytes: 549 cells/uL (ref 200–950)
Basophils Absolute: 10 cells/uL (ref 0–200)
Basophils Relative: 0.5 %
Eosinophils Absolute: 61 cells/uL (ref 15–500)
Eosinophils Relative: 3.2 %
HCT: 30.1 % — ABNORMAL LOW (ref 35.0–45.0)
Hemoglobin: 10.7 g/dL — ABNORMAL LOW (ref 11.7–15.5)
Lymphs Abs: 821 cells/uL — ABNORMAL LOW (ref 850–3900)
MCH: 35.4 pg — ABNORMAL HIGH (ref 27.0–33.0)
MCHC: 35.5 g/dL (ref 32.0–36.0)
MCV: 99.7 fL (ref 80.0–100.0)
MPV: 9.8 fL (ref 7.5–12.5)
Monocytes Relative: 28.9 %
Neutro Abs: 460 cells/uL — ABNORMAL LOW (ref 1500–7800)
Neutrophils Relative %: 24.2 %
Platelets: 184 10*3/uL (ref 140–400)
RBC: 3.02 10*6/uL — ABNORMAL LOW (ref 3.80–5.10)
RDW: 17.7 % — ABNORMAL HIGH (ref 11.0–15.0)
Total Lymphocyte: 43.2 %
WBC: 1.9 10*3/uL — ABNORMAL LOW (ref 3.8–10.8)

## 2018-09-16 LAB — HEMOGLOBIN A1C
Hgb A1c MFr Bld: 6.2 % of total Hgb — ABNORMAL HIGH (ref <5.7)
Mean Plasma Glucose: 131 (calc)
eAG (mmol/L): 7.3 (calc)

## 2018-09-16 LAB — LIPID PANEL
Cholesterol: 170 mg/dL (ref <200)
HDL: 44 mg/dL — ABNORMAL LOW (ref >50)
LDL Cholesterol (Calc): 99 mg/dL (calc)
Non-HDL Cholesterol (Calc): 126 mg/dL (calc) (ref <130)
Total CHOL/HDL Ratio: 3.9 (calc) (ref <5.0)
Triglycerides: 174 mg/dL — ABNORMAL HIGH (ref <150)

## 2018-09-18 ENCOUNTER — Encounter: Payer: Self-pay | Admitting: Internal Medicine

## 2018-09-18 ENCOUNTER — Ambulatory Visit (INDEPENDENT_AMBULATORY_CARE_PROVIDER_SITE_OTHER): Payer: Medicare HMO | Admitting: Internal Medicine

## 2018-09-18 ENCOUNTER — Encounter: Payer: Medicare HMO | Admitting: Adult Health

## 2018-09-18 ENCOUNTER — Encounter: Payer: Medicare HMO | Admitting: Internal Medicine

## 2018-09-18 VITALS — BP 120/80 | HR 70 | Temp 99.0°F | Ht <= 58 in | Wt 134.0 lb

## 2018-09-18 DIAGNOSIS — I10 Essential (primary) hypertension: Secondary | ICD-10-CM

## 2018-09-18 DIAGNOSIS — E785 Hyperlipidemia, unspecified: Secondary | ICD-10-CM | POA: Diagnosis not present

## 2018-09-18 DIAGNOSIS — I82621 Acute embolism and thrombosis of deep veins of right upper extremity: Secondary | ICD-10-CM

## 2018-09-18 DIAGNOSIS — Z0000 Encounter for general adult medical examination without abnormal findings: Secondary | ICD-10-CM

## 2018-09-18 DIAGNOSIS — C562 Malignant neoplasm of left ovary: Secondary | ICD-10-CM

## 2018-09-18 DIAGNOSIS — R739 Hyperglycemia, unspecified: Secondary | ICD-10-CM

## 2018-09-18 MED ORDER — ZOSTER VAC RECOMB ADJUVANTED 50 MCG/0.5ML IM SUSR: 0.5000 mL | Freq: Once | INTRAMUSCULAR | 1 refills | Status: AC

## 2018-09-18 NOTE — Progress Notes (Signed)
Provider:  Rexene Edison. Mariea Clonts, D.O., C.M.D. Location:   [facility]  Place of Service:   clinic  Previous PCP: Gayland Curry, DO Patient Care Team: Gayland Curry, DO as PCP - General (Geriatric Medicine)  Extended Emergency Contact Information Primary Emergency Contact: Illescas,[name] Address: [address]          [place], [place] [address] Johnnette Litter of [place] Phone: [phone] Mobile Phone: [phone] Relation: Spouse  Goals of Care: Advanced Directive information Advanced Directives 09/15/2018  Does Patient Have a Medical Advance Directive? No  Would patient like information on creating a medical advance directive? Yes (MAU/Ambulatory/Procedural Areas - Information given)   Chief Complaint  Patient presents with  . Annual Exam    CPE   HPI: Patient is a 68 y.o. female seen today for an annual physical exam.  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  She had been losing weight and had several different concerns when we'd met which was unlike her.  She had reported cough, headache, runny nose, lower abdominal/suprapubic pain, loss of appetite, tick bite, numbness of her fingers, weight loss and nausea.  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  She has now had hyperglycemia, but it's worsened since her chemo treatment.  She has expected leukopenia and anemia from the chemo on her labs.     Janett Billow ordered here bone density and she's meant to get a shingles vaccine done at the pharmacy.  I sent it today.  Feeling pretty good basically.  She has two more chemo treatments--last is 10/14/18.  She reports she had the hysterectomy and 19 lymph nodes removed.  The chemo was recommended to ensure they got it all.    She had random sharp pains for the first week after  chemo.  She's not had nausea and vomiting, but did have premed.  Just a touch of diarrhea a couple of times.  Things get better after a week.  Going to [place] this weekend.    xarelto to be continued while port in place.  Had right upper extremity DVT.   Past Medical History:  Diagnosis Date  . Cancer ([facility])    left ovarian cancer  . Family history of colon cancer   . Family history of ovarian cancer   . GERD (gastroesophageal reflux disease)   . Hyperlipidemia   . Hypertension    Past Surgical History:  Procedure Laterality Date  . ABDOMINAL HYSTERECTOMY    . BIOPSY THYROID  2008  . COLONOSCOPY    . IR CV LINE INJECTION  07/15/2018  . TONSILECTOMY, ADENOIDECTOMY, BILATERAL MYRINGOTOMY AND TUBES Bilateral 1957    reports that she has never smoked. She has never used smokeless tobacco. She reports that she does not drink alcohol or use drugs.  Functional Status Survey:    Family History  Problem Relation Age of Onset  . Hypertension Father   . Diabetes Father   . Coronary artery disease Father   . Hyperlipidemia Father   . Colon cancer Father        60's  . Hypertension Mother   . Arthritis Mother   . Cancer Maternal Aunt        type unk  . Ovarian cancer Cousin 2  . Cancer Cousin  mouth    Health Maintenance  Topic Date Due  . MAMMOGRAM  08/29/2019  . TETANUS/TDAP  06/23/2022  . COLONOSCOPY  11/06/2026  . INFLUENZA VACCINE  Completed  . DEXA SCAN  Completed  . Hepatitis C Screening  Completed  . PNA vac Low Risk Adult  Completed    No Known Allergies  Outpatient Encounter Medications as of 09/18/2018  Medication Sig  . acetaminophen (TYLENOL) 500 MG tablet Take 1,000 mg by mouth every 6 (six) hours as needed.  Marland Kitchen alendronate (FOSAMAX) 70 MG tablet TAKE 1 TABLET EVERY WEEK  . BLACK COHOSH PO Take 1 capsule by mouth daily.  Marland Kitchen dexamethasone (DECADRON) 4 MG tablet Take 2 tabs at the night before and 2 tabs the morning of chemotherapy every 3 weeks by  mouth.  . fenoprofen (NALFON) 600 MG TABS tablet Take 600 mg by mouth 2 (two) times daily.  . Glucosamine HCl ([facility] GLUCOSAMINE PO) 2 tabs by mouth in the morning and 1 tab by mouth at night  . lidocaine-prilocaine (EMLA) cream Apply to affected area once  . losartan-hydrochlorothiazide (HYZAAR) 100-12.5 MG tablet Take 1 tablet by mouth daily.  . metoprolol tartrate (LOPRESSOR) 50 MG tablet TAKE 1 TABLET TWICE DAILY  . Multiple Vitamin (MULTIVITAMIN) tablet Take 1 tablet by mouth 2 (two) times daily.   . Omega-3 Fatty Acids (OMEGA-3 FISH OIL PO) Take 1 tablet by mouth 2 (two) times daily.  Marland Kitchen omeprazole (PRILOSEC) 20 MG capsule Take 1 capsule (20 mg total) by mouth daily.  . rivaroxaban (XARELTO) 20 MG TABS tablet Take 1 tablet (20 mg total) by mouth daily with supper.  . simvastatin (ZOCOR) 20 MG tablet Take 1 tablet (20 mg total) by mouth daily.  Marland Kitchen Zoster Vaccine Adjuvanted [facility]) injection Inject 0.5 mLs into the muscle once for 1 dose.  . [DISCONTINUED] ondansetron (ZOFRAN) 8 MG tablet Take 1 tablet (8 mg total) by mouth every 8 (eight) hours as needed for refractory nausea / vomiting. Start on day 3 after chemo. (Patient not taking: Reported on 08/19/2018)  . [DISCONTINUED] prochlorperazine (COMPAZINE) 10 MG tablet TAKE 1 TABLET(10 MG) BY MOUTH EVERY 6 HOURS AS NEEDED FOR NAUSEA OR VOMITING (Patient not taking: Reported on 08/19/2018)   No facility-administered encounter medications on file as of 09/18/2018.     Review of Systems  Constitutional: Positive for malaise/fatigue and weight loss. Negative for chills and fever.  HENT: Negative for congestion and hearing loss.        Rhinitis  Eyes: Negative for blurred vision.       Glasses  Respiratory: Negative for cough and shortness of breath.   Cardiovascular: Negative for chest pain, palpitations and leg swelling.  Gastrointestinal: Positive for diarrhea. Negative for abdominal pain, blood in stool, constipation and melena.       A  little diarrhea with chemo at first  Genitourinary: Negative for dysuria.  Musculoskeletal: Positive for joint pain. Negative for falls.       Left knee  Skin: Negative for itching and rash.  Neurological: Positive for sensory change. Negative for dizziness, loss of consciousness and headaches.       Had tingling in right hand before her surgery, but none since anywhere; has loss of sensation on one side of incision and hypersensitivity on the opposite side of the incision (had staples)  Endo/Heme/Allergies: Bruises/bleeds easily.  Psychiatric/Behavioral: Negative for depression and memory loss. The patient is not nervous/anxious and does not have insomnia.     Vitals:  09/18/18 0908  BP: 120/80  Pulse: 70  Temp: 99 F (37.2 C)  TempSrc: Oral  SpO2: 98%  Weight: 134 lb (60.8 kg)  Height: 4' 10" (1.473 m)   Body mass index is 28.01 kg/m. Physical Exam Vitals signs reviewed.  Constitutional:      Appearance: Normal appearance.  HENT:     Head: Normocephalic and atraumatic.     Right Ear: Tympanic membrane, ear canal and external ear normal.     Left Ear: Tympanic membrane, ear canal and external ear normal.     Nose: Rhinorrhea present.     Mouth/Throat:     Mouth: Mucous membranes are moist.     Pharynx: Oropharynx is clear. No oropharyngeal exudate.  Eyes:     Extraocular Movements: Extraocular movements intact.     Conjunctiva/sclera: Conjunctivae normal.     Pupils: Pupils are equal, round, and reactive to light.  Neck:     Musculoskeletal: Normal range of motion.  Cardiovascular:     Rate and Rhythm: Normal rate and regular rhythm.     Pulses: Normal pulses.     Heart sounds: Normal heart sounds.  Pulmonary:     Effort: Pulmonary effort is normal.     Breath sounds: Normal breath sounds.  Abdominal:     General: Bowel sounds are normal. There is no distension.     Palpations: Abdomen is soft. There is no mass.     Tenderness: There is no abdominal tenderness.    Musculoskeletal: Normal range of motion.        General: No swelling or tenderness.     Right lower leg: No edema.     Left lower leg: No edema.  Skin:    General: Skin is warm and dry.     Capillary Refill: Capillary refill takes less than 2 seconds.     Comments: Abdominal incision well healed  Neurological:     General: No focal deficit present.     Mental Status: She is alert and oriented to person, place, and time. Mental status is at baseline.     Cranial Nerves: No cranial nerve deficit.     Sensory: No sensory deficit.     Motor: No weakness.     Gait: Gait normal.  Psychiatric:        Mood and Affect: Mood normal.        Behavior: Behavior normal.        Thought Content: Thought content normal.        Judgment: Judgment normal.     Comments: Slightly tearful at first     Labs reviewed: Basic Metabolic Panel: Recent Labs    08/12/18 0841 09/01/18 1021 09/15/18 1225  NA 139 140 139  K 4.0 3.9 4.5  CL 105 106 103  CO2 20* 25 26  GLUCOSE 321* 101* 100*  BUN _0 CREATININE 1.27* 1.00 1.08*  CALCIUM 9.6 9.6 9.3   Liver Function Tests: Recent Labs    07/21/18 1237 08/12/18 0841 09/01/18 1021 09/15/18 1225  AST _1 38*  ALT 51* 21 23 50*  ALKPHOS 94 43 35*  --   BILITOT 0.3 0.4 0.5 0.5  PROT 7.4 7.3 7.0 7.0  ALBUMIN 3.2* 3.7 3.6  --    No results for input(s): LIPASE, AMYLASE in the last 8760 hours. No results for input(s): AMMONIA in the last 8760 hours. CBC: Recent Labs    08/12/18 0841 09/01/18 1021 09/15/18 1225  WBC 9.8  4.5 1.9*  NEUTROABS 8.9* 2.4 460*  HGB 12.2 11.7* 10.7*  HCT 35.2* 33.4* 30.1*  MCV 93.4 96.3 99.7  PLT 239 236 184   Cardiac Enzymes: No results for input(s): CKTOTAL, CKMB, CKMBINDEX, TROPONINI in the last 8760 hours. BNP: Invalid input(s): POCBNP Lab Results  Component Value Date   HGBA1C 6.2 (H) 09/15/2018   Lab Results  Component Value Date   TSH 3.030 06/02/2015   Reviewed records, imaging, labs,  notes over the past several months since we met.    Assessment/Plan 1. Annual physical exam - performed today  - Hemoglobin A1c; Future - CBC with Differential/Platelet; Future - COMPLETE METABOLIC PANEL WITH GFR; Future - Lipid panel; Future  2. Essential hypertension, benign -bp well controlled - EKG 12-Lead was unremarkable w/o acute changes  3. Left ovarian epithelial cancer [facility]) -is s/p hysterectomy, lymph node resection and now getting chemotherapy, following with Dr. Alvy Bimler from oncology  4. Acute deep vein thrombosis (DVT) of right upper extremity, unspecified vein (HCC) - related to port, pt says in right side of neck -on xarelto while port in place per patient - CBC with Differential/Platelet; Future  5. Hyperglycemia -worsened with chemo which was expectd -Hemoglobin A1c; Future  6. Hyperlipidemia, unspecified hyperlipidemia type -cont zocor therapy - Lipid panel; Future  Labs/tests ordered:   Orders Placed This Encounter  Procedures  . Hemoglobin A1c    Standing Status:   Future    Standing Expiration Date:   09/19/2019  . CBC with Differential/Platelet    Standing Status:   Future    Standing Expiration Date:   09/19/2019  . COMPLETE METABOLIC PANEL WITH GFR    Standing Status:   Future    Standing Expiration Date:   09/19/2019  . Lipid panel    Standing Status:   Future    Standing Expiration Date:   09/19/2019  . EKG 12-Lead   3 mos med mgt (f/u after her visit with her surgeon)  Keyaria Lawson L. Keilah Lemire, D.O. [facility] [address], [place] [address] Cell Phone (Mon-Fri 8am-5pm):  [phone] On Call:  [phone] & follow prompts after 5pm & weekends Office Phone:  [phone] Office Fax:  [phone]

## 2018-09-22 ENCOUNTER — Inpatient Hospital Stay: Payer: Medicare HMO | Admitting: Hematology and Oncology

## 2018-09-22 ENCOUNTER — Encounter: Payer: Self-pay | Admitting: Hematology and Oncology

## 2018-09-22 ENCOUNTER — Inpatient Hospital Stay: Payer: Medicare HMO

## 2018-09-22 ENCOUNTER — Inpatient Hospital Stay: Payer: Medicare HMO | Attending: Obstetrics

## 2018-09-22 DIAGNOSIS — T380X5A Adverse effect of glucocorticoids and synthetic analogues, initial encounter: Secondary | ICD-10-CM

## 2018-09-22 DIAGNOSIS — G720 Drug-induced myopathy: Secondary | ICD-10-CM | POA: Insufficient documentation

## 2018-09-22 DIAGNOSIS — R7989 Other specified abnormal findings of blood chemistry: Secondary | ICD-10-CM | POA: Diagnosis not present

## 2018-09-22 DIAGNOSIS — D61818 Other pancytopenia: Secondary | ICD-10-CM

## 2018-09-22 DIAGNOSIS — I82621 Acute embolism and thrombosis of deep veins of right upper extremity: Secondary | ICD-10-CM | POA: Insufficient documentation

## 2018-09-22 DIAGNOSIS — Z79899 Other long term (current) drug therapy: Secondary | ICD-10-CM | POA: Diagnosis not present

## 2018-09-22 DIAGNOSIS — C562 Malignant neoplasm of left ovary: Secondary | ICD-10-CM

## 2018-09-22 DIAGNOSIS — Z5111 Encounter for antineoplastic chemotherapy: Secondary | ICD-10-CM | POA: Diagnosis not present

## 2018-09-22 DIAGNOSIS — Z7901 Long term (current) use of anticoagulants: Secondary | ICD-10-CM | POA: Diagnosis not present

## 2018-09-22 HISTORY — DX: Drug-induced myopathy: T38.0X5A

## 2018-09-22 HISTORY — DX: Drug-induced myopathy: G72.0

## 2018-09-22 LAB — CBC WITH DIFFERENTIAL/PLATELET
Abs Immature Granulocytes: 0.01 10*3/uL (ref 0.00–0.07)
Basophils Absolute: 0 10*3/uL (ref 0.0–0.1)
Basophils Relative: 1 %
EOS ABS: 0 10*3/uL (ref 0.0–0.5)
Eosinophils Relative: 1 %
HCT: 30 % — ABNORMAL LOW (ref 36.0–46.0)
Hemoglobin: 10.5 g/dL — ABNORMAL LOW (ref 12.0–15.0)
Immature Granulocytes: 0 %
Lymphocytes Relative: 36 %
Lymphs Abs: 1.4 10*3/uL (ref 0.7–4.0)
MCH: 35 pg — AB (ref 26.0–34.0)
MCHC: 35 g/dL (ref 30.0–36.0)
MCV: 100 fL (ref 80.0–100.0)
Monocytes Absolute: 0.5 10*3/uL (ref 0.1–1.0)
Monocytes Relative: 14 %
Neutro Abs: 1.8 10*3/uL (ref 1.7–7.7)
Neutrophils Relative %: 48 %
Platelets: 101 10*3/uL — ABNORMAL LOW (ref 150–400)
RBC: 3 MIL/uL — AB (ref 3.87–5.11)
RDW: 17.2 % — ABNORMAL HIGH (ref 11.5–15.5)
WBC: 3.7 10*3/uL — AB (ref 4.0–10.5)
nRBC: 0 % (ref 0.0–0.2)

## 2018-09-22 LAB — COMPREHENSIVE METABOLIC PANEL
ALT: 30 U/L (ref 0–44)
AST: 29 U/L (ref 15–41)
Albumin: 3.5 g/dL (ref 3.5–5.0)
Alkaline Phosphatase: 48 U/L (ref 38–126)
Anion gap: 9 (ref 5–15)
BUN: 21 mg/dL (ref 8–23)
CALCIUM: 9.4 mg/dL (ref 8.9–10.3)
CO2: 26 mmol/L (ref 22–32)
Chloride: 105 mmol/L (ref 98–111)
Creatinine, Ser: 1.18 mg/dL — ABNORMAL HIGH (ref 0.44–1.00)
GFR calc Af Amer: 54 mL/min — ABNORMAL LOW (ref >60)
GFR calc non Af Amer: 47 mL/min — ABNORMAL LOW (ref >60)
Glucose, Bld: 105 mg/dL — ABNORMAL HIGH (ref 70–99)
Potassium: 3.9 mmol/L (ref 3.5–5.1)
Sodium: 140 mmol/L (ref 135–145)
Total Bilirubin: 0.6 mg/dL (ref 0.3–1.2)
Total Protein: 7.1 g/dL (ref 6.5–8.1)

## 2018-09-22 MED ORDER — SODIUM CHLORIDE 0.9% FLUSH
10.0000 mL | Freq: Once | INTRAVENOUS | Status: AC
  Administered 2018-09-22: 10 mL
  Filled 2018-09-22: qty 10

## 2018-09-22 MED ORDER — HEPARIN SOD (PORK) LOCK FLUSH 100 UNIT/ML IV SOLN
250.0000 [IU] | Freq: Once | INTRAVENOUS | Status: AC
  Administered 2018-09-22: 250 [IU]
  Filled 2018-09-22: qty 5

## 2018-09-22 NOTE — Assessment & Plan Note (Signed)
This is due to recent treatment.  Observe only.  Plan to reduce the dose of carboplatin as above

## 2018-09-22 NOTE — Progress Notes (Signed)
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  Observe only.  Plan to reduce the dose of carboplatin as above  Elevated serum creatinine She has intermittent elevated serum creatinine. 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   INTERVAL HISTORY: Please see below for problem oriented charting. 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 Large pelvic mass with  heterogeneous appearance, measuring at least 15 centimeters in diameter. Although the findings favor enlarged uterus/fibroids, ovaries are not well seen and an ovarian mass should also be considered. Further characterization with pelvic ultrasound is recommended. 2. The bladder is completely decompressed. There is LEFT-sided hydronephrosis secondary to extrinsic compression from the pelvic mass. 3. Irregular thickening of the sigmoid colon, suspicious for colonic wall mass, measuring 1.3 centimeters. In this patient with a family history of colon cancer, further evaluation with colonoscopy is recommended. 4. Colonic diverticula without acute diverticulitis. 5. Moderate hiatal hernia.     05/15/2018 Imaging    MRI pelvis 16 cm complex cystic and solid mass which is centered in the left posterior adnexa and is contiguous with the uterus. This is suspicious for ovarian carcinoma, with differential diagnosis of atypical pedunculated fibroid with peripheral cystic degeneration. Surgical evaluation is recommended.  Other uterine fibroids measuring up to 4.8 cm.  No evidence of pelvic metastatic disease.     05/16/2018 Imaging    US pelvis 1. Large pelvic mass described in detail on MRI examination 1 day prior.  2. Slight ureteral prominence bilaterally. Mild fullness of the left renal collecting system. No obstructing focus evident. Suspect fullness of these structures due to ureteral compression by the sizable pelvic mass.  3.  Study otherwise unremarkable.     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, with necrosis  - Carcinoma is adherent to fallopian tube, consistent with surface involvement (stage pT1c2) - Focal endometriosis present - See synoptic  report and comment  B: Uterus with cervix and right ovary and fallopian tube, hysterectomy and right salpingo-oophorectomy Cervix: Benign with Nabothian cysts Endometrium: Endometrial polyp (size 3.5 cm); inactive endometrium with cystic glandular changes Myometrium: Leiomyomata with hyalinization and calcification (size up to 4.2 cm); focal adenomyosis  Right ovary: Benign physiologic changes Right fallopian tube: Small paratubal cyst and no malignancy identified  C: Lymph nodes, right pelvic, regional resection - Nine lymph nodes with no metastatic carcinoma identified (0/9)  D: Lymph nodes, left pelvic, regional resection - Five lymph nodes with no metastatic carcinoma identified (0/5)  E: Cul-de-sac, anterior, biopsy - Fibroadipose tissue with no carcinoma identified  F: Cul-de-sac, posterior, biopsy - Fibrous tissue with inflammation and no carcinoma identified  G: Pelvic sidewall, right, biopsy - Fibroadipose tissue with focal crushed CD10 positive cells, cannot exclude endometrial type stroma - No carcinoma identified  H: Pelvic sidewall, left, biopsy - Fibroadipose tissue with focal crushed cells, cannot exclude endometrial type stroma - No carcinoma identified  I: Omentum, omentectomy - Adipose and fibrovascular tissue consistent with omentum - No carcinoma identified  J: Lymph nodes, left periaortic, regional resection - Two small lymph nodes with no metastatic carcinoma identified (0/2)  K: Lymph nodes, right periaortic, regional resection - Three small lymph nodes with no metastatic carcinoma identified (0/3)  This electronic signature is attestation that the pathologist personally reviewed the submitted material(s) and the final diagnosis reflects that evaluation.    Synoptic Report     OVARY or FALLOPIAN TUBE or PRIMARY PERITONEUM(Ovary FT Perit - All Specimens)    SPECIMEN  Procedure:Total hysterectomy and bilateral salpingo-oophorectomy    Procedure:Omentectomy   Procedure:Peritonealbiopsies   Procedure:Peritoneal washing   :  Specimen Integrity of Left Ovary:Received largely intact with multiple areas of disruption   Tumor Site:Left ovary   Histologic Type:Clear cell carcinoma   Histologic Grade:High grade   :  Tumor Size:Greatest dimension in Centimeters (cm): 14.0 Centimeters (cm)  Additional Dimension in Centimeters (cm):10 Centimeters (cm)  Additional Dimension in Centimeters (cm):7 Centimeters (cm)  Tumor Extent:  Ovarian Surface Involvement:Present   Laterality:Left   Other Tissue / Organ Involvement:Not identified   Peritoneal Ascitic Fluid:Negative for malignancy (normal / benign)   Pleural Fluid:Not submitted / unknown   Accessory Findings:  LYMPH NODES  Regional Lymph Nodes:All lymph nodes negative for tumor cells   Number of Lymph Nodes Examined:19   Site(s):Right pelvic: 9   Site(s):Left pelvic: 5   Site(s):Right para-aortic: 3   Site(s):Left para-aortic: 2   PATHOLOGIC STAGE CLASSIFICATION (pTNM, AJCC 8th Edition)  Primary Tumor (pT):pT1c2   Regional Lymph Nodes (pN):pN0   FIGO STAGE  FIGO Stage:IC2   ADDITIONAL FINDINGS  Additional Pathologic Findings:Endometriosis   Additional Pathologic Findings:Benign fallopian tube with adherence to carcinoma; also see additional parts   Comment(s)  Comment(s):Also see diagnosis comment.   The frozen section diagnosis is confirmed for specimen A. Sections demonstrate a carcinoma with tubulocystic and papillary architecture with hyalinized cores, lined by cells with high grade nuclei, clear to eosinophilic cytoplasm, and nuclear hobnailing.  The morphologic features are most suggestive of clear cell carcinoma.  Immunohistochemical stains are performed, and demonstrate that the carcinoma is positive  for CK7 and PAX8, consistent with gynecologic origin. It has patchy positivity for Napsin A, wild type p53 staining, and is negative for ER, supporting  the diagnosis of clear cell carcinoma.   Sections of the tumor demonstrate carcinoma with adherence to fallopian tube surface, consistent with surface involvement.  Per the operative note, findings were also suggestive of preoperative rupture of the mass.  Given both these findings, the tumor is staged as a pT1c2 (FIGO IC2)             05/27/2018 Surgery    Preoperative Diagnoses: Pelvic mass  Postoperative Diagnoses: left fallopian tube and ovary consistent with high-grade carcinoma of likely GYN origin. No evidence of metastatic disease.  Procedures: Exploratory laparotomy, total abdominal hysterectomy, bilateral salpingo-oophorectomy, peritoneal biopsies for ovarian cancer staging, bilateral pelvic lymphadenectomy, bilateral para-aortic lymphadenectomy, infracolic omentectomy, R0 resection   Surgeon: Marti Sleigh, MD  Assistants: Tona Sensing, MD - Fellow, Haroldine Laws, MD - Resident, Feliberto Harts, PA-S  Findings: On BME, the cervix is retracted anteriorly with no palpable adnexal fullness. There is no RV septum nodularity. Abdominal exam reveals a mobile large mass that extends to the umbilicus and nearly to the pelvic side walls. On abdominal entry, there was immediate green-brown pelvic fluid that appeared consistent with a spontaneous rupture of the cystic fluid-filled mass. The left ovary was a large cystic complex mass, roughly 15cm in size with smooth surface but containing friable tumor. The mass was densely adherent along the entire posterior wall of the uterus. The uterus had some small intramural fibroids and was roughly 10cm in total size. The right fallopian tube and ovary were normal appearing. The patient has smooth paracolic gutters, liver edge, diaphragm, spleen and stomach. The omentum was retracted but no  evidence of nodularity or gross tumor. The anterior cul-de-sac was clear and the posterior-cul-de-sac peritoneum was clear, but the left ovarian mass wall was adherent to the rectosigmoid and cul-de-sac. There were no palpable pelvic or para-aortic adenopathy.   Of note, an the conclusion of the case, there was mild RIGHT hydronephrosis noted. The ureter was traced from the level of the right common iliac into the anterior utero-vesical ligament. There was an area at the pelvic brim were the hydronephrosis resolved to normal caliber. There was no tethering, stricture, or apparent injury to the ureter. Based on exam, this seemed consistent with packing and retraction causing some transient dilation that resolved with packing and retractor removal.   Intraoperative frozen section of the left ovary was consistent with high-grade carcinoma of GYN origin, possibly serous. There was no gross evidence of disease at the conclusion of the case, R0 resection.  Specimens: 1) Pelvic washings 2) Uterus with cervix  3) Left ovary and fallopian tube, IOFS c/w high-grade GYN carcinoma 4) Right fallopian tube and ovary 5) Peritoneal biopsies - anterior cul-de-sac peritoneum, posterior cul-de-sac peritoneum, left pelvic side wall peritoneum, right pelvic side wall peritoneum 6) Right and left pelvic lymph nodes 7) Right and left para-aortic lymph nodes 8) Omentum 9) Anaerobic/Aerobic culture - preliminary read is no organisms     05/27/2018 Genetic Testing    Patient has genetic testing done for genetic disorder Results revealed patient has the following mutation(s): BRCA 1 on tissue block, neg blood    06/04/2018 Procedure    She had port placement    06/05/2018 Cancer Staging    Staging form: Ovary, Fallopian Tube, and Primary Peritoneal Carcinoma, AJCC 8th Edition - Pathologic: FIGO Stage IC2, calculated as Stage IC (pT1c2, pN0, cM0) - Signed by Heath Lark, MD on 06/05/2018    06/27/2018 Imaging    1. No  evidence of residual/recurrent tumor  in the pelvis. 2. No evidence of metastatic disease in the chest, abdomen or pelvis. 3. Left pelvic sidewall 6.3 x 5.0 cm simple fluid collection, compatible with postsurgical seroma or lymphocele. 4. Small hiatal hernia. 5. Aortic Atherosclerosis (ICD10-I70.0).    06/27/2018 Tumor Marker    Patient's tumor was tested for the following markers: CA-125 Results of the tumor marker test revealed 90.3    07/15/2018 Imaging    Vascular US Findings consistent with acute deep vein thrombosis involving the right internal jugular veins. She is placed on Xarelto    07/21/2018 Tumor Marker    Patient's tumor was tested for the following markers: CA-125 Results of the tumor marker test revealed 22.8     REVIEW OF SYSTEMS:   Constitutional: Denies fevers, chills or abnormal weight loss Eyes: Denies blurriness of vision Ears, nose, mouth, throat, and face: Denies mucositis or sore throat Respiratory: Denies cough, dyspnea or wheezes Cardiovascular: Denies palpitation, chest discomfort or lower extremity swelling Gastrointestinal:  Denies nausea, heartburn or change in bowel habits Skin: Denies abnormal skin rashes Lymphatics: Denies new lymphadenopathy or easy bruising Behavioral/Psych: Mood is stable, no new changes  All other systems were reviewed with the patient and are negative.  I have reviewed the past medical history, past surgical history, social history and family history with the patient and they are unchanged from previous note.  ALLERGIES:  has No Known Allergies.  MEDICATIONS:  Current Outpatient Medications  Medication Sig Dispense Refill  . acetaminophen (TYLENOL) 500 MG tablet Take 1,000 mg by mouth every 6 (six) hours as needed.    Marland Kitchen alendronate (FOSAMAX) 70 MG tablet TAKE 1 TABLET EVERY WEEK 12 tablet 3  . BLACK COHOSH PO Take 1 capsule by mouth daily.    Marland Kitchen dexamethasone (DECADRON) 4 MG tablet Take 2 tabs at the night before and 2  tabs the morning of chemotherapy every 3 weeks by mouth.    . fenoprofen (NALFON) 600 MG TABS tablet Take 600 mg by mouth 2 (two) times daily.    . Glucosamine HCl ([employer] GLUCOSAMINE PO) 2 tabs by mouth in the morning and 1 tab by mouth at night    . lidocaine-prilocaine (EMLA) cream Apply to affected area once 30 g 3  . losartan-hydrochlorothiazide (HYZAAR) 100-12.5 MG tablet Take 1 tablet by mouth daily. 90 tablet 3  . metoprolol tartrate (LOPRESSOR) 50 MG tablet TAKE 1 TABLET TWICE DAILY 180 tablet 1  . Multiple Vitamin (MULTIVITAMIN) tablet Take 1 tablet by mouth 2 (two) times daily.     . Omega-3 Fatty Acids (OMEGA-3 FISH OIL PO) Take 1 tablet by mouth 2 (two) times daily.    Marland Kitchen omeprazole (PRILOSEC) 20 MG capsule Take 1 capsule (20 mg total) by mouth daily. 90 capsule 3  . rivaroxaban (XARELTO) 20 MG TABS tablet Take 1 tablet (20 mg total) by mouth daily with supper. 90 tablet 9  . simvastatin (ZOCOR) 20 MG tablet Take 1 tablet (20 mg total) by mouth daily. 90 tablet 3   No current facility-administered medications for this visit.     PHYSICAL EXAMINATION: ECOG PERFORMANCE STATUS: 1 - Symptomatic but completely ambulatory  Vitals:   09/22/18 1116  BP: 106/64  Pulse: 72  Resp: 18  Temp: 98 F (36.7 C)  SpO2: 100%   Filed Weights   09/22/18 1116  Weight: 134 lb 12.8 oz (61.1 kg)    GENERAL:alert, no distress and comfortable SKIN: skin color, texture, turgor are normal, no rashes or significant lesions EYES:  normal, Conjunctiva are pink and non-injected, sclera clear OROPHARYNX:no exudate, no erythema and lips, buccal mucosa, and tongue normal  NECK: supple, thyroid normal size, non-tender, without nodularity LYMPH:  no palpable lymphadenopathy in the cervical, axillary or inguinal LUNGS: clear to auscultation and percussion with normal breathing effort HEART: regular rate & rhythm and no murmurs and no lower extremity edema ABDOMEN:abdomen soft, non-tender and normal bowel  sounds Musculoskeletal:no cyanosis of digits and no clubbing  NEURO: alert & oriented x 3 with fluent speech, no focal motor/sensory deficits  LABORATORY DATA:  I have reviewed the data as listed    Component Value Date/Time   NA 140 09/22/2018 1027   NA 140 02/29/2016 1027   K 3.9 09/22/2018 1027   CL 105 09/22/2018 1027   CO2 26 09/22/2018 1027   GLUCOSE 105 (H) 09/22/2018 1027   BUN 21 09/22/2018 1027   BUN 17 02/29/2016 1027   CREATININE 1.18 (H) 09/22/2018 1027   CREATININE 1.08 (H) 09/15/2018 1225   CALCIUM 9.4 09/22/2018 1027   PROT 7.1 09/22/2018 1027   PROT 7.0 02/29/2016 1027   ALBUMIN 3.5 09/22/2018 1027   ALBUMIN 4.3 02/29/2016 1027   AST 29 09/22/2018 1027   ALT 30 09/22/2018 1027   ALKPHOS 48 09/22/2018 1027   BILITOT 0.6 09/22/2018 1027   BILITOT 0.9 02/29/2016 1027   GFRNONAA 47 (L) 09/22/2018 1027   GFRNONAA 52 (L) 09/15/2018 1225   GFRAA 54 (L) 09/22/2018 1027   GFRAA 61 09/15/2018 1225    No results found for: SPEP, UPEP  Lab Results  Component Value Date   WBC 3.7 (L) 09/22/2018   NEUTROABS 1.8 09/22/2018   HGB 10.5 (L) 09/22/2018   HCT 30.0 (L) 09/22/2018   MCV 100.0 09/22/2018   PLT 101 (L) 09/22/2018      Chemistry      Component Value Date/Time   NA 140 09/22/2018 1027   NA 140 02/29/2016 1027   K 3.9 09/22/2018 1027   CL 105 09/22/2018 1027   CO2 26 09/22/2018 1027   BUN 21 09/22/2018 1027   BUN 17 02/29/2016 1027   CREATININE 1.18 (H) 09/22/2018 1027   CREATININE 1.08 (H) 09/15/2018 1225      Component Value Date/Time   CALCIUM 9.4 09/22/2018 1027   ALKPHOS 48 09/22/2018 1027   AST 29 09/22/2018 1027   ALT 30 09/22/2018 1027   BILITOT 0.6 09/22/2018 1027   BILITOT 0.9 02/29/2016 1027       All questions were answered. The patient knows to call the clinic with any problems, questions or concerns. No barriers to learning was detected.  I spent 25 minutes counseling the patient face to face. The total time spent in the  appointment was 30 minutes and more than 50% was on counseling and review of test results  Heath Lark, MD 09/22/2018 11:33 AM

## 2018-09-22 NOTE — Assessment & Plan Note (Signed)
She has intermittent elevated serum creatinine. We will adjust the dose of treatment as above

## 2018-09-22 NOTE — Assessment & Plan Note (Signed)
She is doing well with anticoagulation therapy and we will continue Xarelto until completion of therapy and removal of port

## 2018-09-22 NOTE — Assessment & Plan Note (Signed)
She tolerated treatment fairly well except for some mild steroid myopathy, intermittent abnormal kidney function and mild pancytopenia I felt like she is not tolerating higher dose of carboplatin I plan to keep her carboplatin dose at around 380 mg

## 2018-09-22 NOTE — Assessment & Plan Note (Signed)
She has mild steroid-induced myopathy I recommend graduated exercise as tolerated We discussed PT referral but she declined for now

## 2018-09-23 ENCOUNTER — Inpatient Hospital Stay: Payer: Medicare HMO

## 2018-09-23 VITALS — BP 140/91 | HR 73 | Temp 97.8°F | Resp 18

## 2018-09-23 DIAGNOSIS — G720 Drug-induced myopathy: Secondary | ICD-10-CM | POA: Diagnosis not present

## 2018-09-23 DIAGNOSIS — Z79899 Other long term (current) drug therapy: Secondary | ICD-10-CM | POA: Diagnosis not present

## 2018-09-23 DIAGNOSIS — D61818 Other pancytopenia: Secondary | ICD-10-CM | POA: Diagnosis not present

## 2018-09-23 DIAGNOSIS — Z5111 Encounter for antineoplastic chemotherapy: Secondary | ICD-10-CM | POA: Diagnosis not present

## 2018-09-23 DIAGNOSIS — Z7901 Long term (current) use of anticoagulants: Secondary | ICD-10-CM | POA: Diagnosis not present

## 2018-09-23 DIAGNOSIS — I82621 Acute embolism and thrombosis of deep veins of right upper extremity: Secondary | ICD-10-CM | POA: Diagnosis not present

## 2018-09-23 DIAGNOSIS — R7989 Other specified abnormal findings of blood chemistry: Secondary | ICD-10-CM | POA: Diagnosis not present

## 2018-09-23 DIAGNOSIS — C562 Malignant neoplasm of left ovary: Secondary | ICD-10-CM

## 2018-09-23 LAB — CA 125: Cancer Antigen (CA) 125: 14.5 U/mL (ref 0.0–38.1)

## 2018-09-23 MED ORDER — PALONOSETRON HCL INJECTION 0.25 MG/5ML
INTRAVENOUS | Status: AC
  Filled 2018-09-23: qty 5

## 2018-09-23 MED ORDER — FAMOTIDINE IN NACL 20-0.9 MG/50ML-% IV SOLN
20.0000 mg | Freq: Once | INTRAVENOUS | Status: AC
  Administered 2018-09-23: 20 mg via INTRAVENOUS

## 2018-09-23 MED ORDER — FAMOTIDINE IN NACL 20-0.9 MG/50ML-% IV SOLN
INTRAVENOUS | Status: AC
  Filled 2018-09-23: qty 50

## 2018-09-23 MED ORDER — DIPHENHYDRAMINE HCL 50 MG/ML IJ SOLN: 50.0000 mg | Freq: Once | INTRAMUSCULAR | Status: DC

## 2018-09-23 MED ORDER — SODIUM CHLORIDE 0.9 % IV SOLN
380.0000 mg | Freq: Once | INTRAVENOUS | Status: AC
  Administered 2018-09-23: 380 mg via INTRAVENOUS
  Filled 2018-09-23: qty 38

## 2018-09-23 MED ORDER — SODIUM CHLORIDE 0.9 % IV SOLN
Freq: Once | INTRAVENOUS | Status: AC
  Administered 2018-09-23: 10:00:00 via INTRAVENOUS
  Filled 2018-09-23: qty 250

## 2018-09-23 MED ORDER — HEPARIN SOD (PORK) LOCK FLUSH 100 UNIT/ML IV SOLN
500.0000 [IU] | Freq: Once | INTRAVENOUS | Status: AC | PRN
  Administered 2018-09-23: 500 [IU]
  Filled 2018-09-23: qty 5

## 2018-09-23 MED ORDER — PALONOSETRON HCL INJECTION 0.25 MG/5ML
0.2500 mg | Freq: Once | INTRAVENOUS | Status: AC
  Administered 2018-09-23: 0.25 mg via INTRAVENOUS

## 2018-09-23 MED ORDER — SODIUM CHLORIDE 0.9 % IV SOLN
Freq: Once | INTRAVENOUS | Status: AC
  Administered 2018-09-23: 10:00:00 via INTRAVENOUS
  Filled 2018-09-23: qty 5

## 2018-09-23 MED ORDER — SODIUM CHLORIDE 0.9 % IV SOLN
175.0000 mg/m2 | Freq: Once | INTRAVENOUS | Status: AC
  Administered 2018-09-23: 270 mg via INTRAVENOUS
  Filled 2018-09-23: qty 45

## 2018-09-23 MED ORDER — SODIUM CHLORIDE 0.9% FLUSH
10.0000 mL | INTRAVENOUS | Status: DC | PRN
  Administered 2018-09-23: 10 mL
  Filled 2018-09-23: qty 10

## 2018-09-23 NOTE — Patient Instructions (Signed)
[facility] Discharge Instructions for Patients Receiving Chemotherapy  Today you received the following chemotherapy agents Paclitaxel (Taxol) & Carboplatin (Paraplatin)  To help prevent nausea and vomiting after your treatment, we encourage you to take your nausea medication as prescribed.  If you develop nausea and vomiting that is not controlled by your nausea medication, call the clinic.   BELOW ARE SYMPTOMS THAT SHOULD BE REPORTED IMMEDIATELY:  *FEVER GREATER THAN 100.5 F  *CHILLS WITH OR WITHOUT FEVER  NAUSEA AND VOMITING THAT IS NOT CONTROLLED WITH YOUR NAUSEA MEDICATION  *UNUSUAL SHORTNESS OF BREATH  *UNUSUAL BRUISING OR BLEEDING  TENDERNESS IN MOUTH AND THROAT WITH OR WITHOUT PRESENCE OF ULCERS  *URINARY PROBLEMS  *BOWEL PROBLEMS  UNUSUAL RASH Items with * indicate a potential emergency and should be followed up as soon as possible.  Feel free to call the clinic should you have any questions or concerns. The clinic phone number is [phone].  Please show the CHEMO ALERT CARD at check-in to the Emergency Department and triage nurse.   

## 2018-10-01 DIAGNOSIS — Z01419 Encounter for gynecological examination (general) (routine) without abnormal findings: Secondary | ICD-10-CM | POA: Diagnosis not present

## 2018-10-01 DIAGNOSIS — Z6828 Body mass index (BMI) 28.0-28.9, adult: Secondary | ICD-10-CM | POA: Diagnosis not present

## 2018-10-01 DIAGNOSIS — Z1231 Encounter for screening mammogram for malignant neoplasm of breast: Secondary | ICD-10-CM | POA: Diagnosis not present

## 2018-10-01 LAB — HM MAMMOGRAPHY

## 2018-10-07 ENCOUNTER — Encounter: Payer: Self-pay | Admitting: *Deleted

## 2018-10-07 DIAGNOSIS — I10 Essential (primary) hypertension: Secondary | ICD-10-CM | POA: Diagnosis not present

## 2018-10-07 DIAGNOSIS — H52223 Regular astigmatism, bilateral: Secondary | ICD-10-CM | POA: Diagnosis not present

## 2018-10-09 ENCOUNTER — Other Ambulatory Visit: Payer: Self-pay | Admitting: Internal Medicine

## 2018-10-09 DIAGNOSIS — K219 Gastro-esophageal reflux disease without esophagitis: Secondary | ICD-10-CM

## 2018-10-13 ENCOUNTER — Inpatient Hospital Stay: Payer: Medicare HMO | Attending: Obstetrics

## 2018-10-13 ENCOUNTER — Telehealth: Payer: Self-pay | Admitting: Hematology and Oncology

## 2018-10-13 ENCOUNTER — Inpatient Hospital Stay: Payer: Medicare HMO | Admitting: Hematology and Oncology

## 2018-10-13 ENCOUNTER — Encounter: Payer: Self-pay | Admitting: Hematology and Oncology

## 2018-10-13 ENCOUNTER — Inpatient Hospital Stay: Payer: Medicare HMO

## 2018-10-13 VITALS — BP 146/87 | HR 62 | Temp 98.3°F | Resp 17 | Ht <= 58 in | Wt 136.6 lb

## 2018-10-13 DIAGNOSIS — C562 Malignant neoplasm of left ovary: Secondary | ICD-10-CM

## 2018-10-13 DIAGNOSIS — Z7901 Long term (current) use of anticoagulants: Secondary | ICD-10-CM | POA: Insufficient documentation

## 2018-10-13 DIAGNOSIS — Z79899 Other long term (current) drug therapy: Secondary | ICD-10-CM | POA: Diagnosis not present

## 2018-10-13 DIAGNOSIS — I82621 Acute embolism and thrombosis of deep veins of right upper extremity: Secondary | ICD-10-CM | POA: Diagnosis not present

## 2018-10-13 DIAGNOSIS — D61818 Other pancytopenia: Secondary | ICD-10-CM | POA: Diagnosis not present

## 2018-10-13 DIAGNOSIS — Z5111 Encounter for antineoplastic chemotherapy: Secondary | ICD-10-CM | POA: Insufficient documentation

## 2018-10-13 LAB — COMPREHENSIVE METABOLIC PANEL
ALK PHOS: 38 U/L (ref 38–126)
ALT: 23 U/L (ref 0–44)
AST: 22 U/L (ref 15–41)
Albumin: 3.8 g/dL (ref 3.5–5.0)
Anion gap: 8 (ref 5–15)
BILIRUBIN TOTAL: 0.7 mg/dL (ref 0.3–1.2)
BUN: 17 mg/dL (ref 8–23)
CO2: 27 mmol/L (ref 22–32)
Calcium: 9.2 mg/dL (ref 8.9–10.3)
Chloride: 105 mmol/L (ref 98–111)
Creatinine, Ser: 0.98 mg/dL (ref 0.44–1.00)
GFR calc Af Amer: >60 mL/min (ref >60)
GFR, EST NON AFRICAN AMERICAN: 58 mL/min — AB (ref >60)
Glucose, Bld: 93 mg/dL (ref 70–99)
Potassium: 3.7 mmol/L (ref 3.5–5.1)
Sodium: 140 mmol/L (ref 135–145)
Total Protein: 7.1 g/dL (ref 6.5–8.1)

## 2018-10-13 LAB — CBC WITH DIFFERENTIAL/PLATELET
Abs Immature Granulocytes: 0.02 10*3/uL (ref 0.00–0.07)
Basophils Absolute: 0 10*3/uL (ref 0.0–0.1)
Basophils Relative: 1 %
Eosinophils Absolute: 0 10*3/uL (ref 0.0–0.5)
Eosinophils Relative: 1 %
HEMATOCRIT: 27.2 % — AB (ref 36.0–46.0)
Hemoglobin: 9.6 g/dL — ABNORMAL LOW (ref 12.0–15.0)
Immature Granulocytes: 1 %
LYMPHS ABS: 1 10*3/uL (ref 0.7–4.0)
Lymphocytes Relative: 30 %
MCH: 37.1 pg — ABNORMAL HIGH (ref 26.0–34.0)
MCHC: 35.3 g/dL (ref 30.0–36.0)
MCV: 105 fL — ABNORMAL HIGH (ref 80.0–100.0)
Monocytes Absolute: 0.6 10*3/uL (ref 0.1–1.0)
Monocytes Relative: 17 %
NRBC: 0.6 % — AB (ref 0.0–0.2)
Neutro Abs: 1.8 10*3/uL (ref 1.7–7.7)
Neutrophils Relative %: 50 %
Platelets: 103 10*3/uL — ABNORMAL LOW (ref 150–400)
RBC: 2.59 MIL/uL — ABNORMAL LOW (ref 3.87–5.11)
RDW: 18.3 % — ABNORMAL HIGH (ref 11.5–15.5)
WBC: 3.5 10*3/uL — ABNORMAL LOW (ref 4.0–10.5)

## 2018-10-13 MED ORDER — SODIUM CHLORIDE 0.9% FLUSH
10.0000 mL | Freq: Once | INTRAVENOUS | Status: AC
  Administered 2018-10-13: 10 mL
  Filled 2018-10-13: qty 10

## 2018-10-13 MED ORDER — HEPARIN SOD (PORK) LOCK FLUSH 100 UNIT/ML IV SOLN
500.0000 [IU] | Freq: Once | INTRAVENOUS | Status: AC
  Administered 2018-10-13: 500 [IU]
  Filled 2018-10-13: qty 5

## 2018-10-13 MED ORDER — RIVAROXABAN 20 MG PO TABS: 20.0000 mg | ORAL_TABLET | Freq: Every day | ORAL | 9 refills | Status: DC

## 2018-10-13 NOTE — Assessment & Plan Note (Signed)
She tolerated treatment fairly well except for some mild steroid myopathy, intermittent abnormal kidney function and mild pancytopenia I felt like she is not tolerating higher dose of carboplatin I plan to keep her carboplatin dose at around 380 mg After cycle 6 of treatment, I plan to repeat CT imaging within a month Due to BRCA mutation detected on her tumor cells, we discussed briefly about the role of maintenance treatment with olaparib

## 2018-10-13 NOTE — Telephone Encounter (Signed)
Gave avs, calendar, and contrast per los

## 2018-10-13 NOTE — Assessment & Plan Note (Signed)
She is doing well with anticoagulation therapy and we will continue Xarelto until completion of therapy and removal of port

## 2018-10-13 NOTE — Progress Notes (Signed)
[place] OFFICE PROGRESS NOTE  Patient Care Team: Gayland Curry, DO as PCP - General (Geriatric Medicine)  ASSESSMENT & PLAN:  Left ovarian epithelial cancer [facility]) She tolerated treatment fairly well except for some mild steroid myopathy, intermittent abnormal kidney function and mild pancytopenia I felt like she is not tolerating higher dose of carboplatin I plan to keep her carboplatin dose at around 380 mg After cycle 6 of treatment, I plan to repeat CT imaging within a month Due to BRCA mutation detected on her tumor cells, we discussed briefly about the role of maintenance treatment with olaparib  DVT of upper extremity (deep vein thrombosis) ([place]) She is doing well with anticoagulation therapy and we will continue Xarelto until completion of therapy and removal of port   Pancytopenia, acquired ([place]) This is due to recent treatment.  Observe only.  Plan to reduce the dose of carboplatin as above   Orders Placed This Encounter  Procedures  . CT ABDOMEN PELVIS W CONTRAST    Standing Status:   Future    Standing Expiration Date:   10/14/2019    Order Specific Question:   If indicated for the ordered procedure, I authorize the administration of contrast media per Radiology protocol    Answer:   Yes    Order Specific Question:   Preferred imaging location?    Answer:   [facility]    Order Specific Question:   Radiology Contrast Protocol - do NOT remove file path    Answer:   [email or web address]    INTERVAL HISTORY: Please see below for problem oriented charting. She returns to be seen prior to cycle 6 of chemotherapy Since her last time I saw her, she feels well Her weakness is stable No progression of peripheral neuropathy The patient denies any recent signs or symptoms of bleeding such as spontaneous epistaxis, hematuria or hematochezia.  SUMMARY OF ONCOLOGIC HISTORY: Oncology History   Surgery at [facility] Clear cell Positive for BRCA-1 on tumor block, negative blood test     Left ovarian epithelial cancer ([place])   03/17/2018 Initial Diagnosis    Presented to see primary doctor for weight loss and abdominal discomfort    04/29/2018 Imaging    1. Large pelvic mass with heterogeneous appearance, measuring at least 15 centimeters in diameter. Although the findings favor enlarged uterus/fibroids, ovaries are not well seen and an ovarian mass should also be considered. Further characterization with pelvic ultrasound is recommended. 2. The bladder is completely decompressed. There is LEFT-sided hydronephrosis secondary to extrinsic compression from the pelvic mass. 3. Irregular thickening of the sigmoid colon, suspicious for colonic wall mass, measuring 1.3 centimeters. In this patient with a family history of colon cancer, further evaluation with colonoscopy is recommended. 4. Colonic diverticula without acute diverticulitis. 5. Moderate hiatal hernia.     05/15/2018 Imaging    MRI pelvis 16 cm complex cystic and solid mass which is centered in the left posterior adnexa and is contiguous with the uterus. This is suspicious for ovarian carcinoma, with differential diagnosis of atypical pedunculated fibroid with peripheral cystic degeneration. Surgical evaluation is recommended.  Other uterine fibroids measuring up to 4.8 cm.  No evidence of pelvic metastatic disease.     05/16/2018 Imaging    US pelvis 1. Large pelvic mass described in detail on MRI examination 1 day prior.  2. Slight ureteral prominence bilaterally. Mild fullness of the left renal collecting system. No obstructing focus evident. Suspect fullness of these  structures due to ureteral compression by the sizable pelvic mass.  3.  Study otherwise unremarkable.     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, with necrosis  - Carcinoma is adherent to fallopian tube, consistent with surface involvement (stage pT1c2) - Focal endometriosis present - See synoptic report and comment  B: Uterus with cervix and right ovary and fallopian tube, hysterectomy and right salpingo-oophorectomy Cervix: Benign with Nabothian cysts Endometrium: Endometrial polyp (size 3.5 cm); inactive endometrium with cystic glandular changes Myometrium: Leiomyomata with hyalinization and calcification (size up to 4.2 cm); focal adenomyosis  Right ovary: Benign physiologic changes Right fallopian tube: Small paratubal cyst and no malignancy identified  C: Lymph nodes, right pelvic, regional resection - Nine lymph nodes with no metastatic carcinoma identified (0/9)  D: Lymph nodes, left pelvic, regional resection - Five lymph nodes with no metastatic carcinoma identified (0/5)  E: Cul-de-sac, anterior, biopsy - Fibroadipose tissue with no carcinoma identified  F: Cul-de-sac, posterior, biopsy - Fibrous tissue with inflammation and no carcinoma identified  G: Pelvic sidewall, right, biopsy - Fibroadipose tissue with focal crushed CD10 positive cells, cannot exclude endometrial type stroma - No carcinoma identified  H: Pelvic sidewall, left, biopsy - Fibroadipose tissue with focal crushed cells, cannot exclude endometrial type stroma - No carcinoma identified  I: Omentum, omentectomy - Adipose and fibrovascular tissue consistent with omentum - No carcinoma identified  J: Lymph nodes, left periaortic, regional resection - Two small lymph nodes with no metastatic carcinoma identified (0/2)  K: Lymph nodes, right periaortic, regional resection - Three small lymph nodes with no metastatic carcinoma identified (0/3)  This electronic signature is attestation that the pathologist personally reviewed  the submitted material(s) and the final diagnosis reflects that evaluation.    Synoptic Report     OVARY or FALLOPIAN TUBE or PRIMARY PERITONEUM(Ovary FT Perit - All Specimens)    SPECIMEN  Procedure:Total hysterectomy and bilateral salpingo-oophorectomy   Procedure:Omentectomy   Procedure:Peritonealbiopsies   Procedure:Peritoneal washing   :  Specimen Integrity of Left Ovary:Received largely intact with multiple areas of disruption   Tumor Site:Left ovary   Histologic Type:Clear cell carcinoma   Histologic Grade:High grade   :  Tumor Size:Greatest dimension in Centimeters (cm): 14.0 Centimeters (cm)  Additional Dimension in Centimeters (cm):10 Centimeters (cm)  Additional Dimension in Centimeters (cm):7 Centimeters (cm)  Tumor Extent:  Ovarian Surface Involvement:Present   Laterality:Left   Other Tissue / Organ Involvement:Not identified   Peritoneal Ascitic Fluid:Negative for malignancy (normal / benign)   Pleural Fluid:Not submitted / unknown   Accessory Findings:  LYMPH NODES  Regional Lymph Nodes:All lymph nodes negative for tumor cells   Number of Lymph Nodes Examined:19   Site(s):Right pelvic: 9   Site(s):Left pelvic: 5   Site(s):Right para-aortic: 3   Site(s):Left para-aortic: 2   PATHOLOGIC STAGE CLASSIFICATION (pTNM, AJCC 8th Edition)  Primary Tumor (pT):pT1c2   Regional Lymph Nodes (pN):pN0   FIGO STAGE  FIGO Stage:IC2   ADDITIONAL FINDINGS  Additional Pathologic Findings:Endometriosis   Additional Pathologic Findings:Benign fallopian tube with adherence to carcinoma; also see additional parts   Comment(s)  Comment(s):Also see diagnosis comment.   The frozen section diagnosis is confirmed for specimen A. Sections demonstrate a carcinoma with tubulocystic and papillary architecture  with hyalinized cores, lined by cells with high grade nuclei, clear to eosinophilic cytoplasm, and nuclear hobnailing.  The morphologic features are most suggestive of clear cell carcinoma.  Immunohistochemical stains are performed, and demonstrate that the carcinoma is positive for CK7 and PAX8, consistent with gynecologic origin. It has patchy positivity for Napsin A, wild type p53 staining, and is negative for ER, supporting the diagnosis of clear cell carcinoma.   Sections of the tumor demonstrate carcinoma with adherence to fallopian tube surface, consistent with surface involvement.  Per the operative note, findings were also suggestive of preoperative rupture of the mass.  Given both these findings, the tumor is staged as a pT1c2 (FIGO IC2)             05/27/2018 Surgery    Preoperative Diagnoses: Pelvic mass  Postoperative Diagnoses: left fallopian tube and ovary consistent with high-grade carcinoma of likely GYN origin. No evidence of metastatic disease.  Procedures: Exploratory laparotomy, total abdominal hysterectomy, bilateral salpingo-oophorectomy, peritoneal biopsies for ovarian cancer staging, bilateral pelvic lymphadenectomy, bilateral para-aortic lymphadenectomy, infracolic omentectomy, R0 resection   Surgeon: Marti Sleigh, MD  Assistants: Tona Sensing, MD - Fellow, Haroldine Laws, MD - Resident, Feliberto Harts, PA-S  Findings: On BME, the cervix is retracted anteriorly with no palpable adnexal fullness. There is no RV septum nodularity. Abdominal exam reveals a mobile large mass that extends to the umbilicus and nearly to the pelvic side walls. On abdominal entry, there was immediate green-brown pelvic fluid that appeared consistent with a spontaneous rupture of the cystic fluid-filled mass. The left ovary was a large cystic complex mass, roughly 15cm in size with smooth surface but containing friable tumor. The mass was densely adherent along the entire posterior wall  of the uterus. The uterus had some small intramural fibroids and was roughly 10cm in total size. The right fallopian tube and ovary were normal appearing. The patient has smooth paracolic gutters, liver edge, diaphragm, spleen and stomach. The omentum was retracted but no evidence of nodularity or gross tumor. The anterior cul-de-sac was clear and the posterior-cul-de-sac peritoneum was clear, but the left ovarian mass wall was adherent to the rectosigmoid and cul-de-sac. There were no palpable pelvic or para-aortic adenopathy.   Of note, an the conclusion of the case, there was mild RIGHT hydronephrosis noted. The ureter was traced from the level of the right common iliac into the anterior utero-vesical ligament. There was an area at the pelvic brim were the hydronephrosis resolved to normal caliber. There was no tethering, stricture, or apparent injury to the ureter. Based on exam, this seemed consistent with packing and retraction causing some transient dilation that resolved with packing and retractor removal.   Intraoperative frozen section of the left ovary was consistent with high-grade carcinoma of GYN origin, possibly serous. There was no gross evidence of disease at the conclusion of the case, R0 resection.  Specimens: 1) Pelvic washings 2) Uterus with cervix  3) Left ovary and fallopian tube, IOFS c/w high-grade GYN carcinoma 4) Right fallopian tube and ovary 5) Peritoneal biopsies - anterior cul-de-sac peritoneum, posterior cul-de-sac peritoneum, left pelvic side wall peritoneum, right pelvic side wall peritoneum 6) Right and left pelvic lymph nodes 7) Right and left para-aortic lymph nodes 8) Omentum 9) Anaerobic/Aerobic culture - preliminary read is no organisms     05/27/2018 Genetic Testing    Patient has genetic testing done for genetic disorder Results revealed patient has the following mutation(s): BRCA 1 on tissue block, neg blood  06/04/2018 Procedure    She had port  placement    06/05/2018 Cancer Staging    Staging form: Ovary, Fallopian Tube, and Primary Peritoneal Carcinoma, AJCC 8th Edition - Pathologic: FIGO Stage IC2, calculated as Stage IC (pT1c2, pN0, cM0) - Signed by Heath Lark, MD on 06/05/2018    06/27/2018 Imaging    1. No evidence of residual/recurrent tumor in the pelvis. 2. No evidence of metastatic disease in the chest, abdomen or pelvis. 3. Left pelvic sidewall 6.3 x 5.0 cm simple fluid collection, compatible with postsurgical seroma or lymphocele. 4. Small hiatal hernia. 5. Aortic Atherosclerosis (ICD10-I70.0).    06/27/2018 Tumor Marker    Patient's tumor was tested for the following markers: CA-125 Results of the tumor marker test revealed 90.3    07/15/2018 Imaging    Vascular US Findings consistent with acute deep vein thrombosis involving the right internal jugular veins. She is placed on Xarelto    07/21/2018 Tumor Marker    Patient's tumor was tested for the following markers: CA-125 Results of the tumor marker test revealed 22.8    09/22/2018 Tumor Marker    Patient's tumor was tested for the following markers: CA-125 Results of the tumor marker test revealed 14.5     REVIEW OF SYSTEMS:   Constitutional: Denies fevers, chills or abnormal weight loss Eyes: Denies blurriness of vision Ears, nose, mouth, throat, and face: Denies mucositis or sore throat Respiratory: Denies cough, dyspnea or wheezes Cardiovascular: Denies palpitation, chest discomfort or lower extremity swelling Gastrointestinal:  Denies nausea, heartburn or change in bowel habits Skin: Denies abnormal skin rashes Lymphatics: Denies new lymphadenopathy or easy bruising Neurological:Denies numbness, tingling or new weaknesses Behavioral/Psych: Mood is stable, no new changes  All other systems were reviewed with the patient and are negative.  I have reviewed the past medical history, past surgical history, social history and family history with the  patient and they are unchanged from previous note.  ALLERGIES:  has No Known Allergies.  MEDICATIONS:  Current Outpatient Medications  Medication Sig Dispense Refill  . acetaminophen (TYLENOL) 500 MG tablet Take 1,000 mg by mouth every 6 (six) hours as needed.    Marland Kitchen alendronate (FOSAMAX) 70 MG tablet TAKE 1 TABLET EVERY WEEK 12 tablet 3  . BLACK COHOSH PO Take 1 capsule by mouth daily.    Marland Kitchen dexamethasone (DECADRON) 4 MG tablet Take 2 tabs at the night before and 2 tabs the morning of chemotherapy every 3 weeks by mouth.    . fenoprofen (NALFON) 600 MG TABS tablet Take 600 mg by mouth 2 (two) times daily.    . Glucosamine HCl ([employer] GLUCOSAMINE PO) 2 tabs by mouth in the morning and 1 tab by mouth at night    . lidocaine-prilocaine (EMLA) cream Apply to affected area once 30 g 3  . losartan-hydrochlorothiazide (HYZAAR) 100-12.5 MG tablet Take 1 tablet by mouth daily. 90 tablet 3  . metoprolol tartrate (LOPRESSOR) 50 MG tablet TAKE 1 TABLET TWICE DAILY 180 tablet 1  . Multiple Vitamin (MULTIVITAMIN) tablet Take 1 tablet by mouth 2 (two) times daily.     . Omega-3 Fatty Acids (OMEGA-3 FISH OIL PO) Take 1 tablet by mouth 2 (two) times daily.    Marland Kitchen omeprazole (PRILOSEC) 20 MG capsule TAKE 1 CAPSULE EVERY DAY 90 capsule 3  . rivaroxaban (XARELTO) 20 MG TABS tablet Take 1 tablet (20 mg total) by mouth daily with supper. 90 tablet 9  . simvastatin (ZOCOR) 20 MG tablet Take 1 tablet (20  mg total) by mouth daily. 90 tablet 3   No current facility-administered medications for this visit.     PHYSICAL EXAMINATION: ECOG PERFORMANCE STATUS: 1 - Symptomatic but completely ambulatory  Vitals:   10/13/18 1038  BP: (!) 146/87  Pulse: 62  Resp: 17  Temp: 98.3 F (36.8 C)  SpO2: 100%   Filed Weights   10/13/18 1038  Weight: 136 lb 9.6 oz (62 kg)    GENERAL:alert, no distress and comfortable SKIN: skin color, texture, turgor are normal, no rashes or significant lesions EYES: normal, Conjunctiva  are pink and non-injected, sclera clear OROPHARYNX:no exudate, no erythema and lips, buccal mucosa, and tongue normal  NECK: supple, thyroid normal size, non-tender, without nodularity LYMPH:  no palpable lymphadenopathy in the cervical, axillary or inguinal LUNGS: clear to auscultation and percussion with normal breathing effort HEART: regular rate & rhythm and no murmurs and no lower extremity edema ABDOMEN:abdomen soft, non-tender and normal bowel sounds Musculoskeletal:no cyanosis of digits and no clubbing  NEURO: alert & oriented x 3 with fluent speech, no focal motor/sensory deficits  LABORATORY DATA:  I have reviewed the data as listed    Component Value Date/Time   NA 140 10/13/2018 1017   NA 140 02/29/2016 1027   K 3.7 10/13/2018 1017   CL 105 10/13/2018 1017   CO2 27 10/13/2018 1017   GLUCOSE 93 10/13/2018 1017   BUN 17 10/13/2018 1017   BUN 17 02/29/2016 1027   CREATININE 0.98 10/13/2018 1017   CREATININE 1.08 (H) 09/15/2018 1225   CALCIUM 9.2 10/13/2018 1017   PROT 7.1 10/13/2018 1017   PROT 7.0 02/29/2016 1027   ALBUMIN 3.8 10/13/2018 1017   ALBUMIN 4.3 02/29/2016 1027   AST 22 10/13/2018 1017   ALT 23 10/13/2018 1017   ALKPHOS 38 10/13/2018 1017   BILITOT 0.7 10/13/2018 1017   BILITOT 0.9 02/29/2016 1027   GFRNONAA 58 (L) 10/13/2018 1017   GFRNONAA 52 (L) 09/15/2018 1225   GFRAA >60 10/13/2018 1017   GFRAA 61 09/15/2018 1225    No results found for: SPEP, UPEP  Lab Results  Component Value Date   WBC 3.5 (L) 10/13/2018   NEUTROABS 1.8 10/13/2018   HGB 9.6 (L) 10/13/2018   HCT 27.2 (L) 10/13/2018   MCV 105.0 (H) 10/13/2018   PLT 103 (L) 10/13/2018      Chemistry      Component Value Date/Time   NA 140 10/13/2018 1017   NA 140 02/29/2016 1027   K 3.7 10/13/2018 1017   CL 105 10/13/2018 1017   CO2 27 10/13/2018 1017   BUN 17 10/13/2018 1017   BUN 17 02/29/2016 1027   CREATININE 0.98 10/13/2018 1017   CREATININE 1.08 (H) 09/15/2018 1225       Component Value Date/Time   CALCIUM 9.2 10/13/2018 1017   ALKPHOS 38 10/13/2018 1017   AST 22 10/13/2018 1017   ALT 23 10/13/2018 1017   BILITOT 0.7 10/13/2018 1017   BILITOT 0.9 02/29/2016 1027       All questions were answered. The patient knows to call the clinic with any problems, questions or concerns. No barriers to learning was detected.  I spent 15 minutes counseling the patient face to face. The total time spent in the appointment was 20 minutes and more than 50% was on counseling and review of test results  Heath Lark, MD 10/13/2018 12:22 PM

## 2018-10-13 NOTE — Patient Instructions (Signed)
Olaparib oral capsules What is this medicine? OLAPARIB (oh LA pa rib) is a chemotherapy drug. It targets specific enzymes within cancer cells and stops the cancer cell from growing. This medicine is used to treat ovarian cancer. This medicine may be used for other purposes; ask your health care provider or pharmacist if you have questions. COMMON BRAND NAME(S): Lynparza What should I tell my health care provider before I take this medicine? They need to know if you have any of these conditions: -anemia -kidney disease -liver disease -lung disease -low blood counts, like low white cell, platelet, or red cell counts -an unusual or allergic reaction to olaparib, other medicines, foods, dyes, or preservatives -pregnant or trying to get pregnant -breast-feeding How should I use this medicine? Take this medicine by mouth with a glass of water. Follow the directions on the prescription label. Do not cut, crush, or chew this medicine. You can take it with or without food. If it upsets your stomach, take it with food. However, avoid grapefruit juice, grapefruit or [place] oranges while on this medicine. Take your medicine at regular intervals. Do not take it more often than directed. Do not stop taking except on your doctor's advice. A special MedGuide will be given to you by the pharmacist with each prescription and refill. Be sure to read this information carefully each time. Talk to your pediatrician regarding the use of this medicine in children. Special care may be needed. Overdosage: If you think you have taken too much of this medicine contact a poison control center or emergency room at once. NOTE: This medicine is only for you. Do not share this medicine with others. What if I miss a dose? If you miss a dose, take it as soon as you can. If it is almost time for your next dose, take only that dose. Do not take double or extra doses. What may interact with this medicine? -antiviral medicines  for hepatitis, HIV or AIDS -aprepitant -boceprevir -bosentan -carbamazepine -certain medicines for fungal infections like fluconazole, ketoconazole, itraconazole, posaconazole, and voriconazole -certain medicines for infections, such as ciprofloxacin, clarithromycin, erythromycin, telithromycin -crizotinib -diltiazem -grapefruit juice -imatinib -modafinil -nafcillin -nefazodone -phenobarbital -phenytoin -rifampin -[place] oranges -St. John's Wort -telaprevir -verapamil This list may not describe all possible interactions. Give your health care provider a list of all the medicines, herbs, non-prescription drugs, or dietary supplements you use. Also tell them if you smoke, drink alcohol, or use illegal drugs. Some items may interact with your medicine. What should I watch for while using this medicine? This drug may make you feel generally unwell. This is not uncommon, as chemotherapy can affect healthy cells as well as cancer cells. Report any side effects. Continue your course of treatment even though you feel ill unless your doctor tells you to stop. You will need blood work done while you are taking this medicine. This medicine may increase your risk to bruise or bleed. Call your doctor or health care professional if you notice any unusual bleeding. Call your doctor or health care professional for advice if you get a fever, chills or sore throat, or other symptoms of a cold or flu. Do not treat yourself. This drug decreases your body's ability to fight infections. Try to avoid being around people who are sick. If you are going to have surgery or any other procedures, tell your doctor you are taking this medicine. Do not become pregnant while taking this medicine or for 6 months after the last dose. Women should   to fight infections. Try to avoid being around people who are sick.  If you are going to have surgery or any other procedures, tell your doctor you are taking this medicine.  Do not become pregnant while taking this medicine or for 6 months after the last dose. Women should inform their doctor if they wish to become pregnant or think they might be pregnant. Men should not father a child while taking this medicine and for 3 months after stopping it.  Do not donate sperm while taking this medicine and for 3 months after you stop taking this medicine. There is a potential for serious side effects to an unborn child. Talk to your health care professional or pharmacist for more information. Women who are able to become pregnant should use effective birth control during treatment and for at least 6 months after receiving the last dose. Talk to your healthcare provider about birth control methods that may be right for you. Do not breast-feed an infant while taking this medicine or for 1 month after the last dose.  What side effects may I notice from receiving this medicine?  Side effects that you should report to your doctor or health care professional as soon as possible:  -allergic reactions like skin rash, itching or hives, swelling of the face, lips, or tongue  -breathing problems, like shortness of breath, cough, or wheezing  -fever  -low blood counts - this medicine may decrease the number of white blood cells, red blood cells and platelets. You may be at increased risk for infections and bleeding.  -signs and symptoms of bleeding such as bloody or black, tarry stools; red or dark-brown urine; spitting up blood or brown material that looks like coffee grounds; red spots on the skin; unusual bruising or bleeding from the eye, gums, or nose  -signs and symptoms of a blood clot such as changes in vision; chest pain with breathing problems; severe, sudden headache; pain, swelling, warmth in the leg; trouble speaking; sudden numbness or weakness of the face, arm or leg  -signs and symptoms of low magnesium like muscle cramps or muscle pain; tingling or tremors; muscle weakness; seizures; or fast, irregular heartbeat  -signs and symptoms of infection like fever or chills; cough; sore throat; pain or trouble passing urine  -weak or tired  Side effects that usually do not require medical attention (report to your doctor or health care professional if they continue or  are bothersome):  -changes in taste  -diarrhea  -headache  -heartburn, indigestion  -loss of appetite  -muscle or joint pain  -nausea/vomiting  -runny nose  -stomach pain  -weight loss  This list may not describe all possible side effects. Call your doctor for medical advice about side effects. You may report side effects to FDA at 1-800-FDA-1088.  Where should I keep my medicine?  Keep out of the reach of children.  Store between 20 and 25 degrees C (68 and 77 degrees F). Do not store at temperatures greater than 40 degrees C (104 degrees F) and do not take this medicine if you think it may have been stored at a temperature greater than 104 degrees F. Throw away any unused medicine after the expiration date.  NOTE: This sheet is a summary. It may not cover all possible information. If you have questions about this medicine, talk to your doctor, pharmacist, or health care provider.  © 2019 [employer] (2016-09-25 12:45:04)

## 2018-10-13 NOTE — Assessment & Plan Note (Signed)
This is due to recent treatment.  Observe only.  Plan to reduce the dose of carboplatin as above

## 2018-10-14 ENCOUNTER — Inpatient Hospital Stay: Payer: Medicare HMO

## 2018-10-14 VITALS — BP 150/90 | HR 70 | Temp 98.4°F | Resp 18

## 2018-10-14 DIAGNOSIS — Z5111 Encounter for antineoplastic chemotherapy: Secondary | ICD-10-CM | POA: Diagnosis not present

## 2018-10-14 DIAGNOSIS — C562 Malignant neoplasm of left ovary: Secondary | ICD-10-CM

## 2018-10-14 DIAGNOSIS — Z7901 Long term (current) use of anticoagulants: Secondary | ICD-10-CM | POA: Diagnosis not present

## 2018-10-14 DIAGNOSIS — I82621 Acute embolism and thrombosis of deep veins of right upper extremity: Secondary | ICD-10-CM | POA: Diagnosis not present

## 2018-10-14 DIAGNOSIS — D61818 Other pancytopenia: Secondary | ICD-10-CM | POA: Diagnosis not present

## 2018-10-14 DIAGNOSIS — Z79899 Other long term (current) drug therapy: Secondary | ICD-10-CM | POA: Diagnosis not present

## 2018-10-14 LAB — CA 125: Cancer Antigen (CA) 125: 14.2 U/mL (ref 0.0–38.1)

## 2018-10-14 MED ORDER — SODIUM CHLORIDE 0.9% FLUSH
10.0000 mL | INTRAVENOUS | Status: DC | PRN
  Administered 2018-10-14: 10 mL
  Filled 2018-10-14: qty 10

## 2018-10-14 MED ORDER — SODIUM CHLORIDE 0.9 % IV SOLN
175.0000 mg/m2 | Freq: Once | INTRAVENOUS | Status: AC
  Administered 2018-10-14: 270 mg via INTRAVENOUS
  Filled 2018-10-14: qty 45

## 2018-10-14 MED ORDER — HEPARIN SOD (PORK) LOCK FLUSH 100 UNIT/ML IV SOLN
500.0000 [IU] | Freq: Once | INTRAVENOUS | Status: AC | PRN
  Administered 2018-10-14: 500 [IU]
  Filled 2018-10-14: qty 5

## 2018-10-14 MED ORDER — DIPHENHYDRAMINE HCL 50 MG/ML IJ SOLN: 50.0000 mg | Freq: Once | INTRAMUSCULAR | Status: DC

## 2018-10-14 MED ORDER — DIPHENHYDRAMINE HCL 50 MG/ML IJ SOLN
INTRAMUSCULAR | Status: AC
  Filled 2018-10-14: qty 1

## 2018-10-14 MED ORDER — SODIUM CHLORIDE 0.9 % IV SOLN
380.0000 mg | Freq: Once | INTRAVENOUS | Status: AC
  Administered 2018-10-14: 380 mg via INTRAVENOUS
  Filled 2018-10-14: qty 38

## 2018-10-14 MED ORDER — FAMOTIDINE IN NACL 20-0.9 MG/50ML-% IV SOLN
20.0000 mg | Freq: Once | INTRAVENOUS | Status: AC
  Administered 2018-10-14: 20 mg via INTRAVENOUS

## 2018-10-14 MED ORDER — PALONOSETRON HCL INJECTION 0.25 MG/5ML
0.2500 mg | Freq: Once | INTRAVENOUS | Status: AC
  Administered 2018-10-14: 0.25 mg via INTRAVENOUS

## 2018-10-14 MED ORDER — SODIUM CHLORIDE 0.9 % IV SOLN
Freq: Once | INTRAVENOUS | Status: AC
  Administered 2018-10-14: 09:00:00 via INTRAVENOUS
  Filled 2018-10-14: qty 250

## 2018-10-14 MED ORDER — FAMOTIDINE IN NACL 20-0.9 MG/50ML-% IV SOLN
INTRAVENOUS | Status: AC
  Filled 2018-10-14: qty 50

## 2018-10-14 MED ORDER — PALONOSETRON HCL INJECTION 0.25 MG/5ML
INTRAVENOUS | Status: AC
  Filled 2018-10-14: qty 5

## 2018-10-14 MED ORDER — SODIUM CHLORIDE 0.9 % IV SOLN
Freq: Once | INTRAVENOUS | Status: AC
  Administered 2018-10-14: 10:00:00 via INTRAVENOUS
  Filled 2018-10-14: qty 5

## 2018-10-14 NOTE — Patient Instructions (Signed)
[facility] Discharge Instructions for Patients Receiving Chemotherapy  Today you received the following chemotherapy agents Paclitaxel (Taxol) & Carboplatin (Paraplatin)  To help prevent nausea and vomiting after your treatment, we encourage you to take your nausea medication as prescribed.  If you develop nausea and vomiting that is not controlled by your nausea medication, call the clinic.   BELOW ARE SYMPTOMS THAT SHOULD BE REPORTED IMMEDIATELY:  *FEVER GREATER THAN 100.5 F  *CHILLS WITH OR WITHOUT FEVER  NAUSEA AND VOMITING THAT IS NOT CONTROLLED WITH YOUR NAUSEA MEDICATION  *UNUSUAL SHORTNESS OF BREATH  *UNUSUAL BRUISING OR BLEEDING  TENDERNESS IN MOUTH AND THROAT WITH OR WITHOUT PRESENCE OF ULCERS  *URINARY PROBLEMS  *BOWEL PROBLEMS  UNUSUAL RASH Items with * indicate a potential emergency and should be followed up as soon as possible.  Feel free to call the clinic should you have any questions or concerns. The clinic phone number is [phone].  Please show the CHEMO ALERT CARD at check-in to the Emergency Department and triage nurse.   

## 2018-10-14 NOTE — Progress Notes (Unsigned)
Per Dr. Alvy Bimler , do not give IV Benadryl with treatment today.

## 2018-10-17 ENCOUNTER — Other Ambulatory Visit: Payer: Self-pay | Admitting: Internal Medicine

## 2018-10-17 DIAGNOSIS — I10 Essential (primary) hypertension: Secondary | ICD-10-CM

## 2018-11-03 ENCOUNTER — Telehealth: Payer: Self-pay

## 2018-11-03 NOTE — Telephone Encounter (Signed)
Pt has been scheduled for CT scan on 11/10/18

## 2018-11-10 ENCOUNTER — Inpatient Hospital Stay: Payer: Medicare HMO | Attending: Obstetrics

## 2018-11-10 ENCOUNTER — Ambulatory Visit (HOSPITAL_COMMUNITY)
Admission: RE | Admit: 2018-11-10 | Discharge: 2018-11-10 | Disposition: A | Discharge status: Home / Self Care | Payer: Medicare HMO | Source: Ambulatory Visit | Attending: Hematology and Oncology | Admitting: Hematology and Oncology

## 2018-11-10 ENCOUNTER — Inpatient Hospital Stay: Payer: Medicare HMO

## 2018-11-10 DIAGNOSIS — D61818 Other pancytopenia: Secondary | ICD-10-CM | POA: Insufficient documentation

## 2018-11-10 DIAGNOSIS — C562 Malignant neoplasm of left ovary: Secondary | ICD-10-CM | POA: Diagnosis not present

## 2018-11-10 DIAGNOSIS — I82621 Acute embolism and thrombosis of deep veins of right upper extremity: Secondary | ICD-10-CM | POA: Diagnosis not present

## 2018-11-10 DIAGNOSIS — R1909 Other intra-abdominal and pelvic swelling, mass and lump: Secondary | ICD-10-CM | POA: Diagnosis not present

## 2018-11-10 LAB — COMPREHENSIVE METABOLIC PANEL
ALT: 20 U/L (ref 0–44)
AST: 23 U/L (ref 15–41)
Albumin: 4 g/dL (ref 3.5–5.0)
Alkaline Phosphatase: 37 U/L — ABNORMAL LOW (ref 38–126)
Anion gap: 9 (ref 5–15)
BUN: 17 mg/dL (ref 8–23)
CO2: 25 mmol/L (ref 22–32)
CREATININE: 1.05 mg/dL — AB (ref 0.44–1.00)
Calcium: 9.2 mg/dL (ref 8.9–10.3)
Chloride: 105 mmol/L (ref 98–111)
GFR calc Af Amer: >60 mL/min (ref >60)
GFR calc non Af Amer: 54 mL/min — ABNORMAL LOW (ref >60)
Glucose, Bld: 99 mg/dL (ref 70–99)
POTASSIUM: 4 mmol/L (ref 3.5–5.1)
Sodium: 139 mmol/L (ref 135–145)
Total Bilirubin: 0.7 mg/dL (ref 0.3–1.2)
Total Protein: 7.5 g/dL (ref 6.5–8.1)

## 2018-11-10 LAB — CBC WITH DIFFERENTIAL/PLATELET
Abs Immature Granulocytes: 0.01 10*3/uL (ref 0.00–0.07)
Basophils Absolute: 0 10*3/uL (ref 0.0–0.1)
Basophils Relative: 1 %
EOS PCT: 1 %
Eosinophils Absolute: 0 10*3/uL (ref 0.0–0.5)
HCT: 27.3 % — ABNORMAL LOW (ref 36.0–46.0)
Hemoglobin: 9.5 g/dL — ABNORMAL LOW (ref 12.0–15.0)
Immature Granulocytes: 0 %
Lymphocytes Relative: 29 %
Lymphs Abs: 1.1 10*3/uL (ref 0.7–4.0)
MCH: 39.6 pg — ABNORMAL HIGH (ref 26.0–34.0)
MCHC: 34.8 g/dL (ref 30.0–36.0)
MCV: 113.8 fL — ABNORMAL HIGH (ref 80.0–100.0)
Monocytes Absolute: 0.4 10*3/uL (ref 0.1–1.0)
Monocytes Relative: 12 %
Neutro Abs: 2.1 10*3/uL (ref 1.7–7.7)
Neutrophils Relative %: 57 %
PLATELETS: 97 10*3/uL — AB (ref 150–400)
RBC: 2.4 MIL/uL — AB (ref 3.87–5.11)
RDW: 17.4 % — AB (ref 11.5–15.5)
WBC: 3.7 10*3/uL — ABNORMAL LOW (ref 4.0–10.5)
nRBC: 0 % (ref 0.0–0.2)

## 2018-11-10 MED ORDER — HEPARIN SOD (PORK) LOCK FLUSH 100 UNIT/ML IV SOLN
INTRAVENOUS | Status: AC
  Filled 2018-11-10: qty 5

## 2018-11-10 MED ORDER — SODIUM CHLORIDE (PF) 0.9 % IJ SOLN
INTRAMUSCULAR | Status: AC
  Filled 2018-11-10: qty 50

## 2018-11-10 MED ORDER — SODIUM CHLORIDE 0.9% FLUSH
10.0000 mL | Freq: Once | INTRAVENOUS | Status: AC
  Administered 2018-11-10: 10 mL
  Filled 2018-11-10: qty 10

## 2018-11-10 MED ORDER — HEPARIN SOD (PORK) LOCK FLUSH 100 UNIT/ML IV SOLN
500.0000 [IU] | Freq: Once | INTRAVENOUS | Status: AC
  Administered 2018-11-10: 500 [IU] via INTRAVENOUS

## 2018-11-10 MED ORDER — IOHEXOL 300 MG/ML  SOLN
75.0000 mL | Freq: Once | INTRAMUSCULAR | Status: AC | PRN
  Administered 2018-11-10: 75 mL via INTRAVENOUS

## 2018-11-11 ENCOUNTER — Encounter: Payer: Self-pay | Admitting: Hematology and Oncology

## 2018-11-11 ENCOUNTER — Telehealth: Payer: Self-pay | Admitting: Oncology

## 2018-11-11 ENCOUNTER — Inpatient Hospital Stay: Payer: Medicare HMO | Admitting: Hematology and Oncology

## 2018-11-11 ENCOUNTER — Telehealth: Payer: Self-pay | Admitting: Pharmacist

## 2018-11-11 VITALS — BP 109/52 | HR 64 | Temp 98.1°F | Resp 18 | Ht <= 58 in | Wt 141.0 lb

## 2018-11-11 DIAGNOSIS — C562 Malignant neoplasm of left ovary: Secondary | ICD-10-CM

## 2018-11-11 DIAGNOSIS — I82621 Acute embolism and thrombosis of deep veins of right upper extremity: Secondary | ICD-10-CM

## 2018-11-11 DIAGNOSIS — D61818 Other pancytopenia: Secondary | ICD-10-CM | POA: Diagnosis not present

## 2018-11-11 LAB — CA 125: Cancer Antigen (CA) 125: 12.6 U/mL (ref 0.0–38.1)

## 2018-11-11 NOTE — Assessment & Plan Note (Signed)
I have reviewed CT imaging and blood work with the patient She has no clinical signs of cancer Her tumor blood test is positive for BRCA mutation I shared with her publication from [employer] and current guidelines supporting the use of oral PARP inhibitor as maintenance treatment  We discussed the risk, benefits, side effects of olaparib She is not interested She wants a port to be removed She is requesting the port removal to be done locally and I will try to get interventional radiologist to remove it For future follow-up, I recommend close monitoring and surveillance with GYN oncologist along with blood work, history and physical examination She has no residual side effects from treatment except for pancytopenia which will improve in time

## 2018-11-11 NOTE — Telephone Encounter (Signed)
Melissa Simpson with apt for labs on 02/13/19 at 10:00 am and to see Dr. Fermin Schwab on 02/17/19 at 11:45 am.  She verbalized agreement.  Also discussed that the scheduler for interventional radiology is out today and will call her tomorrow or Thursday to schedule her port removal appointment.

## 2018-11-11 NOTE — Assessment & Plan Note (Signed)
She is not symptomatic.  It will likely improve in the next few months.  She can just follow with primary care doctor for repeat CBC in the future

## 2018-11-11 NOTE — Telephone Encounter (Signed)
Oral Oncology Pharmacist Encounter  Received new referral for Lynparza (olaparib) for the maintenance treatment of advanced ovarian cancer in response to platinum based chemotherapy, known BRCA 1 positivity, planned duration until disease progression or unacceptable toxicity.  Labs from 11/10/2018 assessed, OK for treatment. SCr=1.05, est CrCl ~ 50 mL/min Manufacturer recommends dose reduction of Lynparza to 2105m twice daily for CrCl 31-50 mL/min  Current medication list in Epic reviewed, [place].  After preliminary work-up, MD discussed medication start with patient and patient is not agreeable [place] at this [place].  [facility]. [facility] will sign off. Please let [place] if we can be of assistance in the future.  JJohny Drilling PharmD, BCPS, BCOP  11/11/2018 2:00 PM Oral Oncology Clinic [phone]

## 2018-11-11 NOTE — Progress Notes (Signed)
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[place]) I have reviewed CT imaging and blood work with the patient She has no clinical signs of cancer Her tumor blood test is positive for BRCA mutation I shared with her publication from [place] of Medicine and current guidelines supporting the use of oral PARP inhibitor as maintenance treatment  We discussed the risk, benefits, side effects of olaparib She is not interested She wants a port to be removed She is requesting the port removal to be done locally and I will try to get interventional radiologist to remove it For future follow-up, I recommend close monitoring and surveillance with GYN oncologist along with blood work, history and physical examination She has no residual side effects from treatment except for pancytopenia which will improve in time  Pancytopenia, acquired [facility]) She is not symptomatic.  It will likely improve in the next few months.  She can just follow with primary care doctor for repeat CBC in the future  DVT of upper extremity (deep vein thrombosis) ([place]) She has catheter associated thrombosis She has completed 3 months of anticoagulation therapy We will get her port removed She can stop anticoagulation therapy once her current prescription runs out   Orders Placed This Encounter  Procedures  . IR REMOVAL TUN ACCESS W/ PORT W/O FL MOD SED    Standing Status:   Future    Standing Expiration Date:   01/11/2020    Order Specific Question:   Reason for exam:    Answer:   no need port, chemo is completed    Order Specific Question:   Preferred Imaging Location?    Answer:   [facility]    INTERVAL HISTORY: Please see below for problem oriented charting. She returns for further follow-up She has no residual side effects from treatment Energy level is  improving No significant neuropathy from treatment  SUMMARY OF ONCOLOGIC HISTORY: Oncology History   Surgery at [facility] Positive for BRCA-1 on tumor block, negative blood test     Left ovarian epithelial cancer ([employer])   03/17/2018 Initial Diagnosis    Presented to see primary doctor for weight loss and abdominal discomfort    04/29/2018 Imaging    1. Large pelvic mass with heterogeneous appearance, measuring at least 15 centimeters in diameter. Although the findings favor enlarged uterus/fibroids, ovaries are not well seen and an ovarian mass should also be considered. Further characterization with pelvic ultrasound is recommended. 2. The bladder is completely decompressed. There is LEFT-sided hydronephrosis secondary to extrinsic compression from the pelvic mass. 3. Irregular thickening of the sigmoid colon, suspicious for colonic wall mass, measuring 1.3 centimeters. In this patient with a family history of colon cancer, further evaluation with colonoscopy is recommended. 4. Colonic diverticula without acute diverticulitis. 5. Moderate hiatal hernia.     05/15/2018 Imaging    MRI pelvis 16 cm complex cystic and solid mass which is centered in the left posterior adnexa and is contiguous with the uterus. This is suspicious for ovarian carcinoma, with differential diagnosis of atypical pedunculated fibroid with peripheral cystic degeneration. Surgical evaluation is recommended.  Other uterine fibroids measuring up to 4.8 cm.  No evidence of pelvic metastatic disease.     05/16/2018 Imaging    US pelvis 1. Large pelvic mass described in detail on MRI examination 1 day prior.  2. Slight ureteral prominence bilaterally. Mild fullness of the left renal collecting system. No obstructing focus evident. Suspect fullness of these structures due to ureteral compression by the sizable pelvic mass.  3.  Study otherwise unremarkable.     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, with necrosis  - Carcinoma is adherent to fallopian tube, consistent with surface involvement (stage pT1c2) - Focal endometriosis present - See synoptic report and comment  B: Uterus with cervix and right ovary and fallopian tube, hysterectomy and right salpingo-oophorectomy Cervix: Benign with Nabothian cysts Endometrium: Endometrial polyp (size 3.5 cm); inactive endometrium with cystic glandular changes Myometrium: Leiomyomata with hyalinization and calcification (size up to 4.2 cm); focal adenomyosis  Right ovary: Benign physiologic changes Right fallopian tube: Small paratubal cyst and no malignancy identified  C: Lymph nodes, right pelvic, regional resection - Nine lymph nodes with no metastatic carcinoma identified (0/9)  D: Lymph nodes, left pelvic, regional resection - Five lymph nodes with no metastatic carcinoma identified (0/5)  E: Cul-de-sac, anterior, biopsy - Fibroadipose tissue with no carcinoma identified  F: Cul-de-sac, posterior, biopsy - Fibrous tissue with inflammation and no carcinoma identified  G: Pelvic sidewall, right, biopsy - Fibroadipose tissue with focal crushed CD10 positive cells, cannot exclude endometrial type stroma - No carcinoma identified  H: Pelvic sidewall, left, biopsy - Fibroadipose tissue with focal crushed cells, cannot exclude endometrial type stroma - No carcinoma identified  I: Omentum, omentectomy - Adipose and fibrovascular tissue consistent with omentum - No carcinoma identified  J: Lymph nodes, left periaortic, regional resection - Two small lymph nodes with no metastatic carcinoma identified (0/2)  K: Lymph nodes, right periaortic, regional resection - Three small lymph nodes  with no metastatic carcinoma identified (0/3)  This electronic signature is attestation that the pathologist personally reviewed the submitted material(s) and the final diagnosis reflects that evaluation.    Synoptic Report     OVARY or FALLOPIAN TUBE or PRIMARY PERITONEUM(Ovary FT Perit - All Specimens)    SPECIMEN  Procedure:Total hysterectomy and bilateral salpingo-oophorectomy   Procedure:Omentectomy   Procedure:Peritonealbiopsies   Procedure:Peritoneal washing   :  Specimen Integrity of Left Ovary:Received largely intact with multiple areas of disruption   Tumor Site:Left ovary   Histologic Type:Clear cell carcinoma   Histologic Grade:High grade   :  Tumor Size:Greatest dimension in Centimeters (cm): 14.0 Centimeters (cm)  Additional Dimension in Centimeters (cm):10 Centimeters (cm)  Additional Dimension in Centimeters (cm):7 Centimeters (cm)  Tumor Extent:  Ovarian Surface Involvement:Present   Laterality:Left   Other Tissue / Organ Involvement:Not identified   Peritoneal Ascitic Fluid:Negative for malignancy (normal / benign)   Pleural Fluid:Not submitted / unknown   Accessory Findings:  LYMPH NODES  Regional Lymph Nodes:All lymph nodes negative for tumor cells   Number of Lymph Nodes Examined:19   Site(s):Right pelvic: 9   Site(s):Left pelvic: 5   Site(s):Right para-aortic: 3   Site(s):Left para-aortic: 2   PATHOLOGIC STAGE CLASSIFICATION (pTNM, AJCC 8th Edition)  Primary Tumor (pT):pT1c2   Regional Lymph Nodes (pN):pN0   FIGO STAGE  FIGO Stage:IC2   ADDITIONAL FINDINGS  Additional Pathologic Findings:Endometriosis   Additional Pathologic Findings:Benign fallopian tube with adherence to carcinoma; also see additional parts   Comment(s)  Comment(s):Also see diagnosis comment.  The  frozen section diagnosis is confirmed for specimen A. Sections demonstrate a carcinoma with tubulocystic and papillary architecture with hyalinized cores, lined by cells with high grade nuclei, clear to eosinophilic cytoplasm, and nuclear hobnailing.  The morphologic features are most suggestive of clear cell carcinoma.  Immunohistochemical stains are performed, and demonstrate that the carcinoma is positive for CK7 and PAX8, consistent with gynecologic origin. It has patchy positivity for Napsin A, wild type p53 staining, and is negative for ER, supporting the diagnosis of clear cell carcinoma.   Sections of the tumor demonstrate carcinoma with adherence to fallopian tube surface, consistent with surface involvement.  Per the operative note, findings were also suggestive of preoperative rupture of the mass.  Given both these findings, the tumor is staged as a pT1c2 (FIGO IC2)             05/27/2018 Surgery    Preoperative Diagnoses: Pelvic mass  Postoperative Diagnoses: left fallopian tube and ovary consistent with high-grade carcinoma of likely GYN origin. No evidence of metastatic disease.  Procedures: Exploratory laparotomy, total abdominal hysterectomy, bilateral salpingo-oophorectomy, peritoneal biopsies for ovarian cancer staging, bilateral pelvic lymphadenectomy, bilateral para-aortic lymphadenectomy, infracolic omentectomy, R0 resection   Surgeon: Marti Sleigh, MD  Assistants: Tona Sensing, MD - Fellow, Haroldine Laws, MD - Resident, Feliberto Harts, PA-S  Findings: On BME, the cervix is retracted anteriorly with no palpable adnexal fullness. There is no RV septum nodularity. Abdominal exam reveals a mobile large mass that extends to the umbilicus and nearly to the pelvic side walls. On abdominal entry, there was immediate green-brown pelvic fluid that appeared consistent with a spontaneous rupture of the cystic fluid-filled mass. The left ovary was a large cystic complex mass,  roughly 15cm in size with smooth surface but containing friable tumor. The mass was densely adherent along the entire posterior wall of the uterus. The uterus had some small intramural fibroids and was roughly 10cm in total size. The right fallopian tube and ovary were normal appearing. The patient has smooth paracolic gutters, liver edge, diaphragm, spleen and stomach. The omentum was retracted but no evidence of nodularity or gross tumor. The anterior cul-de-sac was clear and the posterior-cul-de-sac peritoneum was clear, but the left ovarian mass wall was adherent to the rectosigmoid and cul-de-sac. There were no palpable pelvic or para-aortic adenopathy.   Of note, an the conclusion of the case, there was mild RIGHT hydronephrosis noted. The ureter was traced from the level of the right common iliac into the anterior utero-vesical ligament. There was an area at the pelvic brim were the hydronephrosis resolved to normal caliber. There was no tethering, stricture, or apparent injury to the ureter. Based on exam, this seemed consistent with packing and retraction causing some transient dilation that resolved with packing and retractor removal.   Intraoperative frozen section of the left ovary was consistent with high-grade carcinoma of GYN origin, possibly serous. There was no gross evidence of disease at the conclusion of the case, R0 resection.  Specimens: 1) Pelvic washings 2) Uterus with cervix  3) Left ovary and fallopian tube, IOFS c/w high-grade GYN carcinoma 4) Right fallopian tube and ovary 5) Peritoneal biopsies - anterior cul-de-sac peritoneum, posterior cul-de-sac peritoneum, left pelvic side wall peritoneum, right pelvic side wall peritoneum 6) Right and left pelvic lymph nodes 7) Right and left para-aortic lymph nodes 8) Omentum 9) Anaerobic/Aerobic culture - preliminary read is no organisms     05/27/2018 Genetic Testing    Patient has genetic testing done for  genetic  disorder Results revealed patient has the following mutation(s): BRCA 1 on tissue block, neg blood    06/04/2018 Procedure    She had port placement    06/05/2018 Cancer Staging    Staging form: Ovary, Fallopian Tube, and Primary Peritoneal Carcinoma, AJCC 8th Edition - Pathologic: FIGO Stage IC2, calculated as Stage IC (pT1c2, pN0, cM0) - Signed by Heath Lark, MD on 06/05/2018    06/27/2018 Imaging    1. No evidence of residual/recurrent tumor in the pelvis. 2. No evidence of metastatic disease in the chest, abdomen or pelvis. 3. Left pelvic sidewall 6.3 x 5.0 cm simple fluid collection, compatible with postsurgical seroma or lymphocele. 4. Small hiatal hernia. 5. Aortic Atherosclerosis (ICD10-I70.0).    06/27/2018 Tumor Marker    Patient's tumor was tested for the following markers: CA-125 Results of the tumor marker test revealed 90.3    07/15/2018 Imaging    Vascular US Findings consistent with acute deep vein thrombosis involving the right internal jugular veins. She is placed on Xarelto    07/21/2018 Tumor Marker    Patient's tumor was tested for the following markers: CA-125 Results of the tumor marker test revealed 22.8    09/22/2018 Tumor Marker    Patient's tumor was tested for the following markers: CA-125 Results of the tumor marker test revealed 14.5    11/10/2018 Imaging    Status post hysterectomy and bilateral salpingo-oophorectomy.  4.9 cm improving postoperative seroma along the left pelvic sidewall.  No evidence of recurrent or metastatic disease.     11/10/2018 Tumor Marker    Patient's tumor was tested for the following markers: CA-125 Results of the tumor marker test revealed 12.6     REVIEW OF SYSTEMS:   Constitutional: Denies fevers, chills or abnormal weight loss Eyes: Denies blurriness of vision Ears, nose, mouth, throat, and face: Denies mucositis or sore throat Respiratory: Denies cough, dyspnea or wheezes Cardiovascular: Denies  palpitation, chest discomfort or lower extremity swelling Gastrointestinal:  Denies nausea, heartburn or change in bowel habits Skin: Denies abnormal skin rashes Lymphatics: Denies new lymphadenopathy or easy bruising Neurological:Denies numbness, tingling or new weaknesses Behavioral/Psych: Mood is stable, no new changes  All other systems were reviewed with the patient and are negative.  I have reviewed the past medical history, past surgical history, social history and family history with the patient and they are unchanged from previous note.  ALLERGIES:  has No Known Allergies.  MEDICATIONS:  Current Outpatient Medications  Medication Sig Dispense Refill  . acetaminophen (TYLENOL) 500 MG tablet Take 1,000 mg by mouth every 6 (six) hours as needed.    Marland Kitchen alendronate (FOSAMAX) 70 MG tablet TAKE 1 TABLET EVERY WEEK 12 tablet 3  . BLACK COHOSH PO Take 1 capsule by mouth daily.    . Glucosamine HCl ([employer] GLUCOSAMINE PO) 2 tabs by mouth in the morning and 1 tab by mouth at night    . losartan-hydrochlorothiazide (HYZAAR) 100-12.5 MG tablet TAKE 1 TABLET EVERY DAY 90 tablet 1  . metoprolol tartrate (LOPRESSOR) 50 MG tablet TAKE 1 TABLET TWICE DAILY 180 tablet 1  . Multiple Vitamin (MULTIVITAMIN) tablet Take 1 tablet by mouth 2 (two) times daily.     . Omega-3 Fatty Acids (OMEGA-3 FISH OIL PO) Take 1 tablet by mouth 2 (two) times daily.    Marland Kitchen omeprazole (PRILOSEC) 20 MG capsule TAKE 1 CAPSULE EVERY DAY 90 capsule 3  . simvastatin (ZOCOR) 20 MG tablet TAKE 1 TABLET EVERY DAY 90 tablet 1  No current facility-administered medications for this visit.     PHYSICAL EXAMINATION: ECOG PERFORMANCE STATUS: 0 - Asymptomatic  Vitals:   11/11/18 1357  BP: (!) 109/52  Pulse: 64  Resp: 18  Temp: 98.1 F (36.7 C)  SpO2: 100%   Filed Weights   11/11/18 1357  Weight: 141 lb (64 kg)    GENERAL:alert, no distress and comfortable Musculoskeletal:no cyanosis of digits and no clubbing  NEURO:  alert & oriented x 3 with fluent speech, no focal motor/sensory deficits  LABORATORY DATA:  I have reviewed the data as listed    Component Value Date/Time   NA 139 11/10/2018 1005   NA 140 02/29/2016 1027   K 4.0 11/10/2018 1005   CL 105 11/10/2018 1005   CO2 25 11/10/2018 1005   GLUCOSE 99 11/10/2018 1005   BUN 17 11/10/2018 1005   BUN 17 02/29/2016 1027   CREATININE 1.05 (H) 11/10/2018 1005   CREATININE 1.08 (H) 09/15/2018 1225   CALCIUM 9.2 11/10/2018 1005   PROT 7.5 11/10/2018 1005   PROT 7.0 02/29/2016 1027   ALBUMIN 4.0 11/10/2018 1005   ALBUMIN 4.3 02/29/2016 1027   AST 23 11/10/2018 1005   ALT 20 11/10/2018 1005   ALKPHOS 37 (L) 11/10/2018 1005   BILITOT 0.7 11/10/2018 1005   BILITOT 0.9 02/29/2016 1027   GFRNONAA 54 (L) 11/10/2018 1005   GFRNONAA 52 (L) 09/15/2018 1225   GFRAA >60 11/10/2018 1005   GFRAA 61 09/15/2018 1225    No results found for: SPEP, UPEP  Lab Results  Component Value Date   WBC 3.7 (L) 11/10/2018   NEUTROABS 2.1 11/10/2018   HGB 9.5 (L) 11/10/2018   HCT 27.3 (L) 11/10/2018   MCV 113.8 (H) 11/10/2018   PLT 97 (L) 11/10/2018      Chemistry      Component Value Date/Time   NA 139 11/10/2018 1005   NA 140 02/29/2016 1027   K 4.0 11/10/2018 1005   CL 105 11/10/2018 1005   CO2 25 11/10/2018 1005   BUN 17 11/10/2018 1005   BUN 17 02/29/2016 1027   CREATININE 1.05 (H) 11/10/2018 1005   CREATININE 1.08 (H) 09/15/2018 1225      Component Value Date/Time   CALCIUM 9.2 11/10/2018 1005   ALKPHOS 37 (L) 11/10/2018 1005   AST 23 11/10/2018 1005   ALT 20 11/10/2018 1005   BILITOT 0.7 11/10/2018 1005   BILITOT 0.9 02/29/2016 1027       RADIOGRAPHIC STUDIES: I have reviewed multiple imaging studies with the patient I have personally reviewed the radiological images as listed and agreed with the findings in the report. Ct Abdomen Pelvis W Contrast  Result Date: 11/10/2018 CLINICAL DATA:  Ovarian cancer, chemotherapy complete, status  post hysterectomy. EXAM: CT ABDOMEN AND PELVIS WITH CONTRAST TECHNIQUE: Multidetector CT imaging of the abdomen and pelvis was performed using the standard protocol following bolus administration of intravenous contrast. CONTRAST:  84m OMNIPAQUE IOHEXOL 300 MG/ML  SOLN COMPARISON:  06/27/2018 FINDINGS: Lower chest: Lung bases are clear. Hepatobiliary: Liver is within normal limits. Gallbladder is unremarkable. No intrahepatic or extrahepatic ductal dilatation. Pancreas: Within normal limits. Spleen: Within normal limits. Adrenals/Urinary Tract: Adrenal glands are within normal limits. Kidneys are within normal limits.  No hydronephrosis. Bladder is underdistended but unremarkable. Stomach/Bowel: Stomach is notable for a small hiatal hernia. No evidence of bowel obstruction. Normal appendix (series 2/image 60). Vascular/Lymphatic: No evidence of abdominal aortic aneurysm. No suspicious abdominopelvic lymphadenopathy. Reproductive: Status post hysterectomy and  bilateral salpingo-oophorectomy. Other: No abdominopelvic ascites. No frank peritoneal nodularity or omental caking. 4.9 x 3.3 cm fluid density lesion in the left pelvic sidewall (series 2/image 69), previously 6.3 x 5.0 cm, corresponding to an improving postoperative seroma. Musculoskeletal: Postsurgical changes along the midline anterior abdominal wall. Tiny fat containing right inguinal hernia (series 2/image 35). Visualized osseous structures are within normal limits. IMPRESSION: Status post hysterectomy and bilateral salpingo-oophorectomy. 4.9 cm improving postoperative seroma along the left pelvic sidewall. No evidence of recurrent or metastatic disease. Electronically Signed   By: Julian Hy M.D.   On: 11/10/2018 13:44    All questions were answered. The patient knows to call the clinic with any problems, questions or concerns. No barriers to learning was detected.  I spent 15 minutes counseling the patient face to face. The total time spent  in the appointment was 20 minutes and more than 50% was on counseling and review of test results  Heath Lark, MD 11/11/2018 3:51 PM

## 2018-11-11 NOTE — Assessment & Plan Note (Signed)
She has catheter associated thrombosis She has completed 3 months of anticoagulation therapy We will get her port removed She can stop anticoagulation therapy once her current prescription runs out

## 2018-11-14 ENCOUNTER — Ambulatory Visit
Admission: RE | Admit: 2018-11-14 | Discharge: 2018-11-14 | Disposition: A | Discharge status: Home / Self Care | Payer: Medicare HMO | Source: Ambulatory Visit | Attending: Nurse Practitioner | Admitting: Nurse Practitioner

## 2018-11-14 DIAGNOSIS — E2839 Other primary ovarian failure: Secondary | ICD-10-CM

## 2018-11-21 ENCOUNTER — Other Ambulatory Visit: Payer: Self-pay | Admitting: Radiology

## 2018-11-24 ENCOUNTER — Ambulatory Visit (HOSPITAL_COMMUNITY)
Admission: RE | Admit: 2018-11-24 | Discharge: 2018-11-24 | Disposition: A | Discharge status: Home / Self Care | Payer: Medicare HMO | Source: Ambulatory Visit | Attending: Hematology and Oncology | Admitting: Hematology and Oncology

## 2018-11-24 ENCOUNTER — Encounter (HOSPITAL_COMMUNITY): Payer: Self-pay

## 2018-11-24 ENCOUNTER — Other Ambulatory Visit: Payer: Self-pay

## 2018-11-24 DIAGNOSIS — Z79899 Other long term (current) drug therapy: Secondary | ICD-10-CM | POA: Diagnosis not present

## 2018-11-24 DIAGNOSIS — K219 Gastro-esophageal reflux disease without esophagitis: Secondary | ICD-10-CM | POA: Diagnosis not present

## 2018-11-24 DIAGNOSIS — C562 Malignant neoplasm of left ovary: Secondary | ICD-10-CM

## 2018-11-24 DIAGNOSIS — C569 Malignant neoplasm of unspecified ovary: Secondary | ICD-10-CM | POA: Diagnosis not present

## 2018-11-24 DIAGNOSIS — Z452 Encounter for adjustment and management of vascular access device: Secondary | ICD-10-CM | POA: Insufficient documentation

## 2018-11-24 DIAGNOSIS — E785 Hyperlipidemia, unspecified: Secondary | ICD-10-CM | POA: Insufficient documentation

## 2018-11-24 DIAGNOSIS — Z9221 Personal history of antineoplastic chemotherapy: Secondary | ICD-10-CM | POA: Diagnosis not present

## 2018-11-24 DIAGNOSIS — I10 Essential (primary) hypertension: Secondary | ICD-10-CM | POA: Insufficient documentation

## 2018-11-24 HISTORY — PX: IR REMOVAL TUN ACCESS W/ PORT W/O FL MOD SED: IMG2290

## 2018-11-24 LAB — CBC WITH DIFFERENTIAL/PLATELET
Abs Immature Granulocytes: 0 10*3/uL (ref 0.00–0.07)
BASOS PCT: 1 %
Basophils Absolute: 0 10*3/uL (ref 0.0–0.1)
EOS ABS: 0.1 10*3/uL (ref 0.0–0.5)
Eosinophils Relative: 2 %
HCT: 34.2 % — ABNORMAL LOW (ref 36.0–46.0)
Hemoglobin: 11.4 g/dL — ABNORMAL LOW (ref 12.0–15.0)
Immature Granulocytes: 0 %
Lymphocytes Relative: 35 %
Lymphs Abs: 1.4 10*3/uL (ref 0.7–4.0)
MCH: 40.3 pg — ABNORMAL HIGH (ref 26.0–34.0)
MCHC: 33.3 g/dL (ref 30.0–36.0)
MCV: 120.8 fL — AB (ref 80.0–100.0)
Monocytes Absolute: 0.6 10*3/uL (ref 0.1–1.0)
Monocytes Relative: 15 %
NRBC: 0 % (ref 0.0–0.2)
Neutro Abs: 1.9 10*3/uL (ref 1.7–7.7)
Neutrophils Relative %: 47 %
PLATELETS: 222 10*3/uL (ref 150–400)
RBC: 2.83 MIL/uL — ABNORMAL LOW (ref 3.87–5.11)
RDW: 15.8 % — ABNORMAL HIGH (ref 11.5–15.5)
WBC: 4 10*3/uL (ref 4.0–10.5)

## 2018-11-24 LAB — PROTIME-INR
INR: 0.8 (ref 0.8–1.2)
Prothrombin Time: 11.2 seconds — ABNORMAL LOW (ref 11.4–15.2)

## 2018-11-24 MED ORDER — CEFAZOLIN SODIUM-DEXTROSE 2-4 GM/100ML-% IV SOLN
INTRAVENOUS | Status: AC
  Administered 2018-11-24: 2 g via INTRAVENOUS
  Filled 2018-11-24: qty 100

## 2018-11-24 MED ORDER — LIDOCAINE HCL 1 % IJ SOLN
INTRAMUSCULAR | Status: AC
  Filled 2018-11-24: qty 20

## 2018-11-24 MED ORDER — SODIUM CHLORIDE 0.9 % IV SOLN
INTRAVENOUS | Status: DC
  Administered 2018-11-24: 13:00:00 via INTRAVENOUS

## 2018-11-24 MED ORDER — MIDAZOLAM HCL 2 MG/2ML IJ SOLN
INTRAMUSCULAR | Status: AC
  Filled 2018-11-24: qty 2

## 2018-11-24 MED ORDER — LIDOCAINE HCL (PF) 1 % IJ SOLN
INTRAMUSCULAR | Status: AC | PRN
  Administered 2018-11-24: 10 mL

## 2018-11-24 MED ORDER — FENTANYL CITRATE (PF) 100 MCG/2ML IJ SOLN
INTRAMUSCULAR | Status: AC | PRN
  Administered 2018-11-24 (×2): 50 ug via INTRAVENOUS

## 2018-11-24 MED ORDER — FENTANYL CITRATE (PF) 100 MCG/2ML IJ SOLN
INTRAMUSCULAR | Status: AC
  Filled 2018-11-24: qty 2

## 2018-11-24 MED ORDER — MIDAZOLAM HCL 2 MG/2ML IJ SOLN
INTRAMUSCULAR | Status: AC | PRN
  Administered 2018-11-24 (×2): 1 mg via INTRAVENOUS

## 2018-11-24 MED ORDER — CEFAZOLIN SODIUM-DEXTROSE 2-4 GM/100ML-% IV SOLN
2.0000 g | INTRAVENOUS | Status: AC
  Administered 2018-11-24: 2 g via INTRAVENOUS

## 2018-11-24 NOTE — H&P (Signed)
Referring Physician(s): Heath Lark  Supervising Physician: Sandi Mariscal  Patient Status:    Chief Complaint: "I'm getting my port out"   Subjective: Patient familiar to IR service from prior Port-A-Cath injection on 07/15/2018.  She has a history of left ovarian epithelial carcinoma and is status post surgery and chemotherapy.  She has completed treatment and recent imaging shows no evidence of disease recurrence.  She presents today for Port-A-Cath removal.  She currently denies fever, headache, chest pain, dyspnea, neck pain, nausea, vomiting or bleeding.  She does have occasional cough as well as some mild abdominal discomfort.  Past Medical History:  Diagnosis Date  . Cancer ([place])    left ovarian cancer  . Family history of colon cancer   . Family history of ovarian cancer   . GERD (gastroesophageal reflux disease)   . Hyperlipidemia   . Hypertension    Past Surgical History:  Procedure Laterality Date  . ABDOMINAL HYSTERECTOMY    . BIOPSY THYROID  2008  . COLONOSCOPY    . IR CV LINE INJECTION  07/15/2018  . TONSILECTOMY, ADENOIDECTOMY, BILATERAL MYRINGOTOMY AND TUBES Bilateral 1957      Allergies: Patient has no known allergies.  Medications: Prior to Admission medications   Medication Sig Start Date End Date Taking? Authorizing Provider  BLACK COHOSH PO Take 1 capsule by mouth daily.   Yes [provider]  Glucosamine HCl ([facility] GLUCOSAMINE PO) 2 tabs by mouth in the morning and 1 tab by mouth at night   Yes [provider]  losartan-hydrochlorothiazide (HYZAAR) 100-12.5 MG tablet TAKE 1 TABLET EVERY DAY 10/17/18  Yes Reed, Tiffany L, DO  metoprolol tartrate (LOPRESSOR) 50 MG tablet TAKE 1 TABLET TWICE DAILY 10/17/18  Yes Reed, Tiffany L, DO  Multiple Vitamin (MULTIVITAMIN) tablet Take 1 tablet by mouth 2 (two) times daily.    Yes [provider]  Omega-3 Fatty Acids (OMEGA-3 FISH OIL PO) Take 1 tablet by mouth 2 (two) times daily.   Yes  [provider]  omeprazole (PRILOSEC) 20 MG capsule TAKE 1 CAPSULE EVERY DAY 10/09/18  Yes Reed, Tiffany L, DO  simvastatin (ZOCOR) 20 MG tablet TAKE 1 TABLET EVERY DAY 10/17/18  Yes Reed, Tiffany L, DO  acetaminophen (TYLENOL) 500 MG tablet Take 1,000 mg by mouth every 6 (six) hours as needed.    [provider]  alendronate (FOSAMAX) 70 MG tablet TAKE 1 TABLET EVERY WEEK 09/08/18   Reed, Tiffany L, DO     Vital Signs: BP (!) 148/89   Pulse 60   Temp 98.1 F (36.7 C) (Oral)   Resp 18   Ht 4\' 10"  (1.473 m)   Wt 141 lb (64 kg)   SpO2 100%   BMI 29.47 kg/m   Physical Exam awake, alert.  Chest clear to auscultation bilaterally.  Clean, intact right chest wall Port-A-Cath.  Heart with regular rate and rhythm.  Abdomen soft, positive bowel sounds, mild lower abdominal tenderness to palpation.  No lower extremity edema.  Imaging: No results found.  Labs:  CBC: Recent Labs    09/22/18 1027 10/13/18 1017 11/10/18 1005 11/24/18 1319  WBC 3.7* 3.5* 3.7* 4.0  HGB 10.5* 9.6* 9.5* 11.4*  HCT 30.0* 27.2* 27.3* 34.2*  PLT 101* 103* 97* 222    COAGS: Recent Labs    11/24/18 1319  INR 0.8    BMP: Recent Labs    09/15/18 1225 09/22/18 1027 10/13/18 1017 11/10/18 1005  NA 139 140 140 139  K  4.5 3.9 3.7 4.0  CL 103 105 105 105  CO2 26 26 27 25   GLUCOSE 100* 105* 93 99  BUN 19 21 17 17   CALCIUM 9.3 9.4 9.2 9.2  CREATININE 1.08* 1.18* 0.98 1.05*  GFRNONAA 52* 47* 58* 54*  GFRAA 61 54* >60 >60    LIVER FUNCTION TESTS: Recent Labs    09/01/18 1021 09/15/18 1225 09/22/18 1027 10/13/18 1017 11/10/18 1005  BILITOT 0.5 0.5 0.6 0.7 0.7  AST 21 38* 29 22 23   ALT 23 50* 30 23 20   ALKPHOS 35*  --  48 38 37*  PROT 7.0 7.0 7.1 7.1 7.5  ALBUMIN 3.6  --  3.5 3.8 4.0    Assessment and Plan: Pt with history of left ovarian epithelial carcinoma with prior treatment.  Recent imaging shows no evidence of disease recurrence.  She presents today for Port-A-Cath  removal.  Past history also significant for right IJ DVT in 2019, status post completion of anticoagulation.  Details/risks of procedure, including but not limited to, internal bleeding, infection, injury to adjacent structures discussed with patient and spouse with their understanding and consent.   Electronically Signed: D. Rowe Robert, PA-C 11/24/2018, 1:53 PM   I spent a total of 20 minutes at the the patient's bedside AND on the patient's hospital floor or unit, greater than 50% of which was counseling/coordinating care for Port-A-Cath removal

## 2018-11-24 NOTE — Procedures (Signed)
Pre Procedural Dx: Poor venous access Post Procedural Dx: Same  Successful removal of anterior chest wall port-a-cath.  EBL: Minimal  No immediate post procedural complications.   Jay Maudry Zeidan, MD Pager #: [phone]   

## 2018-11-24 NOTE — Discharge Instructions (Addendum)
Moderate Conscious Sedation, Adult, Care After  These instructions provide you with information about caring for yourself after your procedure. Your health care provider may also give you more specific instructions. Your treatment has been planned according to current medical practices, but problems sometimes occur. Call your health care provider if you have any problems or questions after your procedure.  What can I expect after the procedure?  After your procedure, it is common:   To feel sleepy for several hours.   To feel clumsy and have poor balance for several hours.   To have poor judgment for several hours.   To vomit if you eat too soon.  Follow these instructions at home:  For at least 24 hours after the procedure:     Do not:  ? Participate in activities where you could fall or become injured.  ? Drive.  ? Use heavy machinery.  ? Drink alcohol.  ? Take sleeping pills or medicines that cause drowsiness.  ? Make important decisions or sign legal documents.  ? Take care of children on your own.   Rest.  Eating and drinking   Follow the diet recommended by your health care provider.   If you vomit:  ? Drink water, juice, or soup when you can drink without vomiting.  ? Make sure you have little or no nausea before eating solid foods.  General instructions   Have a responsible adult stay with you until you are awake and alert.   Take over-the-counter and prescription medicines only as told by your health care provider.   If you smoke, do not smoke without supervision.   Keep all follow-up visits as told by your health care provider. This is important.  Contact a health care provider if:   You keep feeling nauseous or you keep vomiting.   You feel light-headed.   You develop a rash.   You have a fever.  Get help right away if:   You have trouble breathing.  This information is not intended to replace advice given to you by your health care provider. Make sure you discuss any questions you have  with your health care provider.  Document Released: 06/17/2013 Document Revised: 01/30/2016 Document Reviewed: 12/17/2015  Elsevier Interactive Patient Education  2019 Elsevier Inc.  Implanted Port Removal    Implanted port removal is a procedure to remove the port and catheter that are implanted under your skin. The port is a small disc under your skin that can be punctured with a needle. It is connected to a vein in your chest or neck by a small flexible tube (catheter). The implanted port is used to give medicines for treatments, and it may also be used to take blood samples.  Your health care provider will remove the implanted port if:   You no longer need it for treatment.   It is not working properly.   The area around it gets infected.  Tell a health care provider about:   Any allergies you have.   All medicines you are taking, including vitamins, herbs, eye drops, creams, and over-the-counter medicines.   Any problems you or family members have had with anesthetic medicines.   Any blood disorders you have.   Any surgeries you have had.   Any medical conditions you have.   Whether you are pregnant or may be pregnant.  What are the risks?  Generally, this is a safe procedure. However, problems may occur, including:   Infection.   Bleeding.     Allergic reactions to anesthetic medicines.   Damage to nerves or blood vessels.  What happens before the procedure?  Medicines   Ask your health care provider about:  ? Changing or stopping your regular medicines. This is especially important if you are taking diabetes medicines or blood thinners.  ? Taking medicines such as aspirin and ibuprofen. These medicines can thin your blood. Do not take these medicines unless your health care provider tells you to take them.  ? Taking over-the-counter medicines, vitamins, herbs, and supplements.  General instructions   You will have:  ? A physical exam.  ? Blood tests.  ? Imaging tests, including a chest  X-ray.   Follow instructions from your health care provider about eating or drinking restrictions.   Ask your health care provider how your surgical site will be marked or identified.   Ask your health care provider what steps will be taken to help prevent infection. These may include:  ? Removing hair at the surgery site.  ? Washing skin with a germ-killing soap.  ? Antibiotic medicine.   Plan to have someone take you home from the hospital or clinic.   If you will be going home right after the procedure, plan to have a responsible adult care for you for at least 24 hours after you leave the hospital or clinic. This is important.  What happens during the procedure?   You may be given one or more of the following:  ? A medicine to help you relax (sedative).  ? A medicine to numb the area (local anesthetic).   A small incision will be made at the site of your implanted port.   The implanted port and the catheter that has been inside your vein will be gently removed.   The port and catheter will be inspected to make sure that all the parts have been removed. Part of the catheter may be tested for bacteria.   The incision will be closed with stitches (sutures), adhesive strips, or skin glue.   A bandage (dressing) will be placed over the incision. The health care provider may apply gentle pressure over the dressing for about 5 minutes.  The procedure may vary among health care providers and hospitals.  What happens after the procedure?   Your blood pressure, heart rate, breathing rate, and blood oxygen level will be monitored until you leave the hospital or clinic.   You will be monitored to make sure that there is no bleeding from the site where the port was removed.   Do not drive for 24 hours if you were given a sedative during your procedure.  Summary   Implanted port removal is a procedure to remove the port and catheter that are implanted under your skin.   Before the procedure, follow your health  care provider's instructions about changing or stopping your regular medicines. This is especially important if you are taking diabetes medicines or blood thinners.   If you will be going home right after the procedure, plan to have a responsible adult care for you for at least 24 hours after you leave the hospital or clinic.  This information is not intended to replace advice given to you by your health care provider. Make sure you discuss any questions you have with your health care provider.  Document Released: 08/08/2015 Document Revised: 10/10/2017 Document Reviewed: 10/10/2017  Elsevier Interactive Patient Education  2019 Elsevier Inc.

## 2018-12-02 ENCOUNTER — Ambulatory Visit: Payer: Medicare HMO | Admitting: Gynecology

## 2018-12-15 ENCOUNTER — Other Ambulatory Visit: Payer: Medicare HMO

## 2018-12-18 ENCOUNTER — Ambulatory Visit: Payer: Medicare HMO | Admitting: Nurse Practitioner

## 2019-01-12 ENCOUNTER — Other Ambulatory Visit: Payer: Medicare HMO

## 2019-01-21 ENCOUNTER — Telehealth: Payer: Self-pay | Admitting: *Deleted

## 2019-01-21 NOTE — Telephone Encounter (Signed)
Called and spoke with the patient, changed her appt to a phone visit.

## 2019-02-13 ENCOUNTER — Inpatient Hospital Stay: Payer: Medicare HMO | Attending: Obstetrics

## 2019-02-13 ENCOUNTER — Other Ambulatory Visit: Payer: Self-pay

## 2019-02-13 DIAGNOSIS — C562 Malignant neoplasm of left ovary: Secondary | ICD-10-CM | POA: Insufficient documentation

## 2019-02-14 LAB — CA 125: Cancer Antigen (CA) 125: 11.8 U/mL (ref 0.0–38.1)

## 2019-02-16 ENCOUNTER — Telehealth: Payer: Self-pay | Admitting: Gynecology

## 2019-02-16 NOTE — Telephone Encounter (Signed)
Contacted pt to verify telephone visit for pre reg °

## 2019-02-17 ENCOUNTER — Inpatient Hospital Stay (HOSPITAL_BASED_OUTPATIENT_CLINIC_OR_DEPARTMENT_OTHER): Payer: Medicare HMO | Admitting: Gynecology

## 2019-02-17 ENCOUNTER — Encounter: Payer: Self-pay | Admitting: Gynecology

## 2019-02-17 DIAGNOSIS — Z90710 Acquired absence of both cervix and uterus: Secondary | ICD-10-CM

## 2019-02-17 DIAGNOSIS — C562 Malignant neoplasm of left ovary: Secondary | ICD-10-CM | POA: Diagnosis not present

## 2019-02-17 DIAGNOSIS — Z9221 Personal history of antineoplastic chemotherapy: Secondary | ICD-10-CM

## 2019-02-17 DIAGNOSIS — Z90722 Acquired absence of ovaries, bilateral: Secondary | ICD-10-CM

## 2019-02-17 NOTE — Patient Instructions (Signed)
Return to see [place] in 3 months.   CA125 before next visit

## 2019-02-17 NOTE — Progress Notes (Signed)
Consult Note: Gyn-Onc   Melissa Simpson 68 y.o. female  No chief complaint on file. Virtual Visit via Telephone Note  I connected with Tsosie Billing on 02/17/19 at 11:45 AM EDT by telephone and verified that I am speaking with the correct person using two identifiers.  Location: Patient: home Provider: Gaspar Cola   I discussed the limitations, risks, security and privacy concerns of performing an evaluation and management service by telephone and the availability of in person appointments. I also discussed with the patient that there may be a patient responsible charge related to this service. The patient expressed understanding and agreed to proceed.  I discussed the assessment and treatment plan with the patient. The patient was provided an opportunity to ask questions and all were answered. The patient agreed with the plan and demonstrated an understanding of the instructions.   The patient was advised to call back or seek an in-person evaluation if the symptoms worsen or if the condition fails to improve as anticipated.  I provided 20 minutes of non-face-to-face time during this encounter.   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  Clinically NED and normal CA125.  Plan:  We will see the patient in 3 months.  CA125 prior to that visit.  Interval History: Patient returns today as scheduled for a phone visit.  She reports she is doing well.  Funcitonal status is good and she has no symptoms suggestive of ovarian cancer including no GI, GU or pelvic symptoms. Recent CA125 was 11 u/ml. She had her port removed due to a DVT.  She has no swelling or pain in her neck or arm.  HPI: Patient initially presented with a large abdominal pelvic mass and abdominal pain.  She underwent expiratory laparotomy May 27, 2018.  She had a 15 cm left ovarian complex mass.  She underwent debulking and surgical staging including total abdominal  hysterectomy, bilateral salpingo-oophorectomy, peritoneal biopsies, bilateral pelvic and periodic lymphadenectomy and infracolic omentectomy.  She had an uncomplicated postoperative course.  Final pathology showed a stage Ic clear cell carcinoma of the ovary.  The ovarian mass had ruptured preoperatively.  She received 6 cycles of carbo/taxol completed 10/14/2018.  Follow up CA125 was 14 u/ml and CT showed no disease and a CT showed a resolving lymphocyst.   She developed an upper extremity DVT requiring 3 months of anticoagulation.  The port was then removed.    Review of Systems:10 point review of systems is negative except as noted in interval history.   Vitals: There were no vitals taken for this visit.  Physical Exam: Not performed:  Phone visit  No Known Allergies  Past Medical History:  Diagnosis Date  . Cancer ([place])    left ovarian cancer  . Family history of colon cancer   . Family history of ovarian cancer   . GERD (gastroesophageal reflux disease)   . Hyperlipidemia   . Hypertension     Past Surgical History:  Procedure Laterality Date  . ABDOMINAL HYSTERECTOMY    . BIOPSY THYROID  2008  . COLONOSCOPY    . IR CV LINE INJECTION  07/15/2018  . IR REMOVAL TUN ACCESS W/ PORT W/O FL MOD SED  11/24/2018  . TONSILECTOMY, ADENOIDECTOMY, BILATERAL MYRINGOTOMY AND TUBES Bilateral 1957    Current Outpatient Medications  Medication Sig Dispense Refill  . acetaminophen (TYLENOL) 500 MG tablet Take 1,000 mg by mouth every 6 (six) hours as needed.    Marland Kitchen  alendronate (FOSAMAX) 70 MG tablet TAKE 1 TABLET EVERY WEEK 12 tablet 3  . BLACK COHOSH PO Take 1 capsule by mouth daily.    . Glucosamine HCl ([facility] GLUCOSAMINE PO) 2 tabs by mouth in the morning and 1 tab by mouth at night    . losartan-hydrochlorothiazide (HYZAAR) 100-12.5 MG tablet TAKE 1 TABLET EVERY DAY 90 tablet 1  . metoprolol tartrate (LOPRESSOR) 50 MG tablet TAKE 1 TABLET TWICE DAILY 180 tablet 1  . Multiple Vitamin  (MULTIVITAMIN) tablet Take 1 tablet by mouth 2 (two) times daily.     . Omega-3 Fatty Acids (OMEGA-3 FISH OIL PO) Take 1 tablet by mouth 2 (two) times daily.    Marland Kitchen omeprazole (PRILOSEC) 20 MG capsule TAKE 1 CAPSULE EVERY DAY 90 capsule 3  . simvastatin (ZOCOR) 20 MG tablet TAKE 1 TABLET EVERY DAY 90 tablet 1   No current facility-administered medications for this visit.     Social History   Socioeconomic History  . Marital status: Married    Spouse name: Louie Casa  . Number of children: 3  . Years of education: Not on file  . Highest education level: Not on file  Occupational History  . Occupation: retired Solicitor  Social Needs  . Financial resource strain: Not hard at all  . Food insecurity:    Worry: Never true    Inability: Never true  . Transportation needs:    Medical: No    Non-medical: No  Tobacco Use  . Smoking status: Never Smoker  . Smokeless tobacco: Never Used  Substance and Sexual Activity  . Alcohol use: No  . Drug use: No  . Sexual activity: Not on file  Lifestyle  . Physical activity:    Days per week: 0 days    Minutes per session: 0 min  . Stress: Only a little  Relationships  . Social connections:    Talks on phone: Never    Gets together: More than three times a week    Attends religious service: 1 to 4 times per year    Active member of club or organization: No    Attends meetings of clubs or organizations: Never    Relationship status: Married  . Intimate partner violence:    Fear of current or ex partner: No    Emotionally abused: No    Physically abused: No    Forced sexual activity: No  Other Topics Concern  . Not on file  Social History Narrative   Diet:Unrestricted   Do you drink/eat things with caffeine? Rarely   Marital status: Married                             What year were you married? 1992   Do you live in a house, apartment, assisted living, condo, trailer, etc)? House   Is it one or more stories? 1   How many persons  live in your home? 3   Do you have any pets in your home?  2 small dogs   Current or past profession: Chief of Staff   Do you exercise?                                                     Type & how often:   Do you have  a living will? No   Do you have a DNR Form? No   Do you have a POA/HPOA forms? NO    Family History  Problem Relation Age of Onset  . Hypertension Father   . Diabetes Father   . Coronary artery disease Father   . Hyperlipidemia Father   . Colon cancer Father        45's  . Hypertension Mother   . Arthritis Mother   . Cancer Maternal Aunt        type unk  . Ovarian cancer Cousin 4  . Cancer Cousin        mouth      Marti Sleigh, MD 02/17/2019, 11:51 AM

## 2019-02-23 ENCOUNTER — Other Ambulatory Visit: Payer: Self-pay

## 2019-02-23 ENCOUNTER — Other Ambulatory Visit: Payer: Medicare HMO

## 2019-02-23 DIAGNOSIS — I82621 Acute embolism and thrombosis of deep veins of right upper extremity: Secondary | ICD-10-CM | POA: Diagnosis not present

## 2019-02-23 DIAGNOSIS — R739 Hyperglycemia, unspecified: Secondary | ICD-10-CM | POA: Diagnosis not present

## 2019-02-23 DIAGNOSIS — E785 Hyperlipidemia, unspecified: Secondary | ICD-10-CM

## 2019-02-23 DIAGNOSIS — Z0000 Encounter for general adult medical examination without abnormal findings: Secondary | ICD-10-CM

## 2019-02-24 LAB — CBC WITH DIFFERENTIAL/PLATELET
Absolute Monocytes: 476 cells/uL (ref 200–950)
Basophils Absolute: 41 cells/uL (ref 0–200)
Basophils Relative: 1 %
Eosinophils Absolute: 123 cells/uL (ref 15–500)
Eosinophils Relative: 3 %
HCT: 39.4 % (ref 35.0–45.0)
Hemoglobin: 13.9 g/dL (ref 11.7–15.5)
Lymphs Abs: 1283 cells/uL (ref 850–3900)
MCH: 36.1 pg — ABNORMAL HIGH (ref 27.0–33.0)
MCHC: 35.3 g/dL (ref 32.0–36.0)
MCV: 102.3 fL — ABNORMAL HIGH (ref 80.0–100.0)
MPV: 9.4 fL (ref 7.5–12.5)
Monocytes Relative: 11.6 %
Neutro Abs: 2177 cells/uL (ref 1500–7800)
Neutrophils Relative %: 53.1 %
Platelets: 241 10*3/uL (ref 140–400)
RBC: 3.85 10*6/uL (ref 3.80–5.10)
RDW: 11.2 % (ref 11.0–15.0)
Total Lymphocyte: 31.3 %
WBC: 4.1 10*3/uL (ref 3.8–10.8)

## 2019-02-24 LAB — LIPID PANEL
Cholesterol: 140 mg/dL (ref <200)
HDL: 47 mg/dL — ABNORMAL LOW (ref >=50)
LDL Cholesterol (Calc): 70 mg/dL (calc)
Non-HDL Cholesterol (Calc): 93 mg/dL (calc) (ref <130)
Total CHOL/HDL Ratio: 3 (calc) (ref <5.0)
Triglycerides: 143 mg/dL (ref <150)

## 2019-02-24 LAB — COMPLETE METABOLIC PANEL WITH GFR
AG Ratio: 1.8 (calc) (ref 1.0–2.5)
ALT: 22 U/L (ref 6–29)
AST: 26 U/L (ref 10–35)
Albumin: 4.6 g/dL (ref 3.6–5.1)
Alkaline phosphatase (APISO): 25 U/L — ABNORMAL LOW (ref 37–153)
BUN/Creatinine Ratio: 21 (calc) (ref 6–22)
BUN: 24 mg/dL (ref 7–25)
CO2: 27 mmol/L (ref 20–32)
Calcium: 9.9 mg/dL (ref 8.6–10.4)
Chloride: 102 mmol/L (ref 98–110)
Creat: 1.13 mg/dL — ABNORMAL HIGH (ref 0.60–0.93)
GFR, Est African American: 57 mL/min/{1.73_m2} — ABNORMAL LOW (ref >=60)
GFR, Est Non African American: 49 mL/min/{1.73_m2} — ABNORMAL LOW (ref >=60)
Globulin: 2.6 g/dL (calc) (ref 1.9–3.7)
Glucose, Bld: 99 mg/dL (ref 65–99)
Potassium: 4.1 mmol/L (ref 3.5–5.3)
Sodium: 140 mmol/L (ref 135–146)
Total Bilirubin: 0.9 mg/dL (ref 0.2–1.2)
Total Protein: 7.2 g/dL (ref 6.1–8.1)

## 2019-02-24 LAB — HEMOGLOBIN A1C
Hgb A1c MFr Bld: 5.6 % of total Hgb (ref <5.7)
Mean Plasma Glucose: 114 (calc)
eAG (mmol/L): 6.3 (calc)

## 2019-02-26 ENCOUNTER — Encounter: Payer: Self-pay | Admitting: Internal Medicine

## 2019-02-26 ENCOUNTER — Ambulatory Visit (INDEPENDENT_AMBULATORY_CARE_PROVIDER_SITE_OTHER): Payer: Medicare HMO | Admitting: Internal Medicine

## 2019-02-26 ENCOUNTER — Other Ambulatory Visit: Payer: Self-pay

## 2019-02-26 VITALS — BP 110/60 | HR 46 | Temp 98.1°F | Ht <= 58 in | Wt 140.0 lb

## 2019-02-26 DIAGNOSIS — Z1159 Encounter for screening for other viral diseases: Secondary | ICD-10-CM | POA: Diagnosis not present

## 2019-02-26 DIAGNOSIS — C562 Malignant neoplasm of left ovary: Secondary | ICD-10-CM

## 2019-02-26 DIAGNOSIS — E785 Hyperlipidemia, unspecified: Secondary | ICD-10-CM | POA: Diagnosis not present

## 2019-02-26 DIAGNOSIS — R739 Hyperglycemia, unspecified: Secondary | ICD-10-CM

## 2019-02-26 DIAGNOSIS — I10 Essential (primary) hypertension: Secondary | ICD-10-CM | POA: Diagnosis not present

## 2019-02-26 NOTE — Patient Instructions (Addendum)
Ok to try to taper off your omeprazole.    Please check your blood pressure daily for 2 weeks and send me your results.

## 2019-02-26 NOTE — Progress Notes (Signed)
Location:  [facility] clinic Provider:  Emelin Dascenzo L. Mariea Clonts, D.O., C.M.D.  Goals of Care:  Advanced Directives 11/24/2018  Does Patient Have a Medical Advance Directive? Yes  Type of Paramedic of [place];Living will  Does patient want to make changes to medical advance directive? No - Patient declined  Copy of [place] in Chart? No - copy requested  Would patient like information on creating a medical advance directive? No - Patient declined   Chief Complaint  Patient presents with  . Medical Management of Chronic Issues    5MTH FOLLOW-UP    HPI: Patient is a 68 y.o. female seen today for medical management of chronic diseases.    Had her gyn/onc visit on 02/17/19.  Her CA-125 was satisfactory.  She had had an exploratory laparotomy, TAH-SBO, b/l para=aortic lymphadenectomy, bilateral pelvic lymphadenectomy, infracolic omentectomy and right ovarian resection.  Her final path had shown clear cell ca of the left ovary.  She then had chemotherapy and used the cold cap which preserved about half of her hair for which she is grateful.  She reports doing well in terms of this.  She still occasionally gets a weird sensation in her left lower quadrant similar to what she had before her surgery.  She's also got an area of numbness on her abdomen to the left of her umbilicus since the surgery.  No new symptoms.  She is getting back to normal.  She cannot stick to an exercise program--had been rotating b/w treadmill and stationary bike, but when they went to their cabin, she came back and forgot.    BP at goal.  Wonders about reducing her medication, but we don't have enough data to do this.  I suggested she check her bp daily for 2 weeks and send me the results in mychart.  She agrees to this.  She thinks maybe she does not need omeprazole anymore.   She'd like to go without it, also.  She tries to watch what she eats before bed when she has the most trouble.     Takes fosamax weekly for her bone density.Has her black cohosh for hot flashes--she was hoping they'd have stopped by now. Next bone density is next Wednesday.  Still does not sleep well.  She may go to sleep, but wakes up in a couple hours.  She may or may not go back to sleep.  One night last week laid awake until 2am.  She can't turn her brain off.  They are planning a trip and she's thinking of all that.  She did sleep better last night.  Her husband has ordered an herbal sleep med--all natural.  She does take her melatonin.  She can't tell that it helps.     Arthritis in knees bother her the most.  She trimmed the hedges last week and her back is still sore from that.    Past Medical History:  Diagnosis Date  . Cancer ([place])    left ovarian cancer  . Family history of colon cancer   . Family history of ovarian cancer   . GERD (gastroesophageal reflux disease)   . Hyperlipidemia   . Hypertension     Past Surgical History:  Procedure Laterality Date  . ABDOMINAL HYSTERECTOMY    . BIOPSY THYROID  2008  . COLONOSCOPY    . IR CV LINE INJECTION  07/15/2018  . IR REMOVAL TUN ACCESS W/ PORT W/O FL MOD SED  11/24/2018  .  TONSILECTOMY, ADENOIDECTOMY, BILATERAL MYRINGOTOMY AND TUBES Bilateral 1957    Allergies  Allergen Reactions  . Pollen Extract     Outpatient Encounter Medications as of 02/26/2019  Medication Sig  . alendronate (FOSAMAX) 70 MG tablet TAKE 1 TABLET EVERY WEEK  . BLACK COHOSH PO Take 1 capsule by mouth daily.  . Glucosamine HCl ([facility] GLUCOSAMINE PO) 2 tabs by mouth in the morning and 1 tab by mouth at night  . losartan-hydrochlorothiazide (HYZAAR) 100-12.5 MG tablet TAKE 1 TABLET EVERY DAY  . Melatonin 5 MG TABS Take 1 tablet by mouth at bedtime.  . metoprolol tartrate (LOPRESSOR) 50 MG tablet TAKE 1 TABLET TWICE DAILY  . Multiple Vitamin (MULTIVITAMIN) tablet Take 1 tablet by mouth 2 (two) times daily.   . Omega-3 Fatty Acids (OMEGA-3 FISH OIL PO) Take 1 tablet by  mouth 2 (two) times daily.  Marland Kitchen omeprazole (PRILOSEC) 20 MG capsule TAKE 1 CAPSULE EVERY DAY  . simvastatin (ZOCOR) 20 MG tablet TAKE 1 TABLET EVERY DAY  . vitamin C (ASCORBIC ACID) 500 MG tablet Take 500 mg by mouth daily.  . [DISCONTINUED] acetaminophen (TYLENOL) 500 MG tablet Take 1,000 mg by mouth every 6 (six) hours as needed.   No facility-administered encounter medications on file as of 02/26/2019.     Review of Systems:  Review of Systems  Constitutional: Negative for chills, fever and malaise/fatigue.  HENT: Negative for hearing loss.   Eyes: Negative for blurred vision.  Respiratory: Negative for cough and shortness of breath.   Cardiovascular: Negative for chest pain, palpitations and leg swelling.  Gastrointestinal: Negative for abdominal pain, blood in stool, constipation, diarrhea, heartburn, melena, nausea and vomiting.  Genitourinary: Negative for dysuria.  Musculoskeletal: Positive for back pain, joint pain and myalgias. Negative for falls.       Left knee  Skin: Negative for itching and rash.  Neurological: Negative for dizziness and loss of consciousness.  Endo/Heme/Allergies: Does not bruise/bleed easily.  Psychiatric/Behavioral: Negative for depression and memory loss. The patient is nervous/anxious and has insomnia.     Health Maintenance  Topic Date Due  . INFLUENZA VACCINE  04/11/2019  . MAMMOGRAM  10/01/2020  . TETANUS/TDAP  06/23/2022  . COLONOSCOPY  11/06/2026  . DEXA SCAN  Completed  . Hepatitis C Screening  Completed  . PNA vac Low Risk Adult  Completed    Physical Exam: Vitals:   02/26/19 0937  BP: 110/60  Pulse: (!) 46  Temp: 98.1 F (36.7 C)  TempSrc: Oral  SpO2: 97%  Weight: 140 lb (63.5 kg)  Height: 4\' 10"  (1.473 m)   Body mass index is 29.26 kg/m. Physical Exam Vitals signs reviewed.  Constitutional:      Appearance: Normal appearance. She is normal weight.  HENT:     Head: Normocephalic and atraumatic.  Cardiovascular:      Rate and Rhythm: Normal rate and regular rhythm.     Pulses: Normal pulses.     Heart sounds: Normal heart sounds.  Pulmonary:     Effort: Pulmonary effort is normal. No respiratory distress.     Breath sounds: Normal breath sounds. No wheezing, rhonchi or rales.  Abdominal:     General: Bowel sounds are normal. There is no distension.     Palpations: Abdomen is soft. There is no mass.     Tenderness: There is no abdominal tenderness. There is no guarding or rebound.  Musculoskeletal: Normal range of motion.  Skin:    General: Skin is warm and dry.  Neurological:     Mental Status: She is alert and oriented to person, place, and time.     Motor: No weakness.  Psychiatric:        Mood and Affect: Mood normal.        Behavior: Behavior normal.     Labs reviewed: Basic Metabolic Panel: Recent Labs    10/13/18 1017 11/10/18 1005 02/23/19 1010  NA 140 139 140  K 3.7 4.0 4.1  CL 105 105 102  CO2 27 25 27   GLUCOSE 93 99 99  BUN 17 17 24   CREATININE 0.98 1.05* 1.13*  CALCIUM 9.2 9.2 9.9   Liver Function Tests: Recent Labs    09/22/18 1027 10/13/18 1017 11/10/18 1005 02/23/19 1010  AST 29 22 23 26   ALT 30 23 20 22   ALKPHOS 48 38 37*  --   BILITOT 0.6 0.7 0.7 0.9  PROT 7.1 7.1 7.5 7.2  ALBUMIN 3.5 3.8 4.0  --    No results for input(s): LIPASE, AMYLASE in the last 8760 hours. No results for input(s): AMMONIA in the last 8760 hours. CBC: Recent Labs    11/10/18 1005 11/24/18 1319 02/23/19 1010  WBC 3.7* 4.0 4.1  NEUTROABS 2.1 1.9 2,177  HGB 9.5* 11.4* 13.9  HCT 27.3* 34.2* 39.4  MCV 113.8* 120.8* 102.3*  PLT 97* 222 241   Lipid Panel: Recent Labs    03/12/18 0906 09/15/18 1225 02/23/19 1010  CHOL 137 170 140  HDL 34* 44* 47*  LDLCALC 77 99 70  TRIG 159* 174* 143  CHOLHDL 4.0 3.9 3.0   Lab Results  Component Value Date   HGBA1C 5.6 02/23/2019    Procedures since last visit: No results found.  Assessment/Plan 1. Left ovarian epithelial cancer  (HCC) - s/p surgery and chemo - no new symptoms and CA-125 11.8 on 6/5 which was satisfactory per gyn/onc - CBC with Differential/Platelet; Future - COMPLETE METABOLIC PANEL WITH GFR; Future  2. Essential hypertension, benign -bp at goal past couple of times, asked her to check daily at home for 2 wks and send me the results in mychart--she agrees to do this -we may be able to reduce or stop one of her meds if all are good  - COMPLETE METABOLIC PANEL WITH GFR; Future  3. Hyperglycemia - encouraged regular exercise and healthy diet - Hemoglobin A1c; Future  4. Hyperlipidemia, unspecified hyperlipidemia type -continue zocor, f/u lab - Lipid panel; Future  5. Encounter for hepatitis C screening test for low risk patient - pt agrees to test - Hepatitis C antibody; Future  Labs/tests ordered:   Lab Orders     Hepatitis C antibody     CBC with Differential/Platelet     COMPLETE METABOLIC PANEL WITH GFR     Hemoglobin A1c     Lipid panel  Next appt:  6 mo f/u--pt left w/o making   Rodrigo Mcgranahan L. Timoteo Carreiro, D.O. [facility] [address], [place] [address] Cell Phone (Mon-Fri 8am-5pm):  [phone] On Call:  [phone] & follow prompts after 5pm & weekends Office Phone:  [phone] Office Fax:  [phone]

## 2019-03-04 ENCOUNTER — Ambulatory Visit
Admission: RE | Admit: 2019-03-04 | Discharge: 2019-03-04 | Disposition: A | Discharge status: Home / Self Care | Payer: Medicare HMO | Source: Ambulatory Visit | Attending: Nurse Practitioner | Admitting: Nurse Practitioner

## 2019-03-04 ENCOUNTER — Other Ambulatory Visit: Payer: Self-pay

## 2019-03-04 DIAGNOSIS — M8589 Other specified disorders of bone density and structure, multiple sites: Secondary | ICD-10-CM | POA: Diagnosis not present

## 2019-03-04 DIAGNOSIS — Z780 Asymptomatic menopausal state: Secondary | ICD-10-CM | POA: Diagnosis not present

## 2019-03-16 ENCOUNTER — Encounter: Payer: Self-pay | Admitting: Internal Medicine

## 2019-03-16 ENCOUNTER — Other Ambulatory Visit: Payer: Self-pay | Admitting: Internal Medicine

## 2019-03-16 DIAGNOSIS — I10 Essential (primary) hypertension: Secondary | ICD-10-CM

## 2019-03-16 NOTE — Telephone Encounter (Signed)
Message routed to Reed, Tiffany L, DO  

## 2019-03-17 ENCOUNTER — Other Ambulatory Visit: Payer: Self-pay | Admitting: Internal Medicine

## 2019-03-17 DIAGNOSIS — I10 Essential (primary) hypertension: Secondary | ICD-10-CM

## 2019-03-17 MED ORDER — LOSARTAN POTASSIUM-HCTZ 50-12.5 MG PO TABS: 1.0000 | ORAL_TABLET | Freq: Every day | ORAL | 0 refills | Status: DC

## 2019-03-30 ENCOUNTER — Encounter: Payer: Self-pay | Admitting: Internal Medicine

## 2019-03-30 NOTE — Telephone Encounter (Signed)
Forwarded message to [employer], DO

## 2019-04-09 ENCOUNTER — Other Ambulatory Visit: Payer: Self-pay | Admitting: Internal Medicine

## 2019-04-10 ENCOUNTER — Encounter: Payer: Self-pay | Admitting: Internal Medicine

## 2019-04-10 NOTE — Telephone Encounter (Signed)
Routed to Reed, Tiffany L, DO  

## 2019-04-17 ENCOUNTER — Other Ambulatory Visit: Payer: Self-pay | Admitting: *Deleted

## 2019-04-17 DIAGNOSIS — I10 Essential (primary) hypertension: Secondary | ICD-10-CM

## 2019-04-17 MED ORDER — LOSARTAN POTASSIUM-HCTZ 50-12.5 MG PO TABS: 1.0000 | ORAL_TABLET | Freq: Every day | ORAL | 1 refills | Status: DC

## 2019-04-17 NOTE — Telephone Encounter (Signed)
[facility]

## 2019-04-23 ENCOUNTER — Encounter: Payer: Self-pay | Admitting: Internal Medicine

## 2019-04-23 ENCOUNTER — Other Ambulatory Visit: Payer: Self-pay

## 2019-04-23 ENCOUNTER — Ambulatory Visit (INDEPENDENT_AMBULATORY_CARE_PROVIDER_SITE_OTHER): Payer: Medicare HMO | Admitting: Internal Medicine

## 2019-04-23 VITALS — BP 130/80 | HR 51 | Temp 97.9°F | Wt 141.0 lb

## 2019-04-23 DIAGNOSIS — R350 Frequency of micturition: Secondary | ICD-10-CM | POA: Diagnosis not present

## 2019-04-23 DIAGNOSIS — N3000 Acute cystitis without hematuria: Secondary | ICD-10-CM | POA: Diagnosis not present

## 2019-04-23 LAB — POCT URINALYSIS DIPSTICK
Bilirubin, UA: NEGATIVE
Blood, UA: NEGATIVE
Glucose, UA: NEGATIVE
Ketones, UA: NEGATIVE
Leukocytes, UA: NEGATIVE
Nitrite, UA: NEGATIVE
Protein, UA: NEGATIVE
Spec Grav, UA: 1.02 (ref 1.010–1.025)
Urobilinogen, UA: 0.2 E.U./dL
pH, UA: 5 (ref 5.0–8.0)

## 2019-04-23 MED ORDER — CIPROFLOXACIN HCL 500 MG PO TABS: 500.0000 mg | ORAL_TABLET | Freq: Two times a day (BID) | ORAL | 0 refills | Status: DC

## 2019-04-23 NOTE — Progress Notes (Signed)
Location:  [facility]    Place of Service:   [facility] Advanced Directives 11/24/2018  Does Patient Have a Medical Advance Directive? Yes  Type of Paramedic of [place];Living will  Does patient want to make changes to medical advance directive? No - Patient declined  Copy of [place] in Chart? No - copy requested  Would patient like information on creating a medical advance directive? No - Patient declined     Chief Complaint  Patient presents with  . Acute Visit    frequent urination    HPI: Patient is a 68 y.o. female seen today for an acute visit of urinary tract infection. Two days ago she started to have increased urinary frequency and urgency. She started AZO Maximum Strength and increased her fluids. She denies any fever, pain, burning and hematuria.   Past Medical History:  Diagnosis Date  . Cancer ([place])    left ovarian cancer  . Family history of colon cancer   . Family history of ovarian cancer   . GERD (gastroesophageal reflux disease)   . Hyperlipidemia   . Hypertension     Past Surgical History:  Procedure Laterality Date  . ABDOMINAL HYSTERECTOMY    . BIOPSY THYROID  2008  . COLONOSCOPY    . IR CV LINE INJECTION  07/15/2018  . IR REMOVAL TUN ACCESS W/ PORT W/O FL MOD SED  11/24/2018  . TONSILECTOMY, ADENOIDECTOMY, BILATERAL MYRINGOTOMY AND TUBES Bilateral 1957    Allergies  Allergen Reactions  . Pollen Extract     Outpatient Encounter Medications as of 04/23/2019  Medication Sig  . alendronate (FOSAMAX) 70 MG tablet TAKE 1 TABLET EVERY WEEK  . BLACK COHOSH PO Take 1 capsule by mouth daily.  . Glucosamine HCl ([facility] GLUCOSAMINE PO) 2 tabs by mouth in the morning and 1 tab by mouth at night  . losartan-hydrochlorothiazide (HYZAAR) 50-12.5 MG tablet Take 1 tablet by mouth daily.  . Melatonin 5 MG TABS Take 1 tablet by mouth at bedtime.  . metoprolol tartrate (LOPRESSOR) 50 MG tablet TAKE 1 TABLET TWICE DAILY  . Multiple  Vitamin (MULTIVITAMIN) tablet Take 1 tablet by mouth 2 (two) times daily.   . Omega-3 Fatty Acids (OMEGA-3 FISH OIL PO) Take 1 tablet by mouth 2 (two) times daily.  Marland Kitchen omeprazole (PRILOSEC) 20 MG capsule TAKE 1 CAPSULE EVERY DAY  . simvastatin (ZOCOR) 20 MG tablet TAKE 1 TABLET EVERY DAY  . vitamin C (ASCORBIC ACID) 500 MG tablet Take 500 mg by mouth daily.   No facility-administered encounter medications on file as of 04/23/2019.     Review of Systems:  Review of Systems  Constitutional: Negative for activity change, fatigue and fever.  Genitourinary: Positive for frequency and urgency. Negative for dysuria and hematuria.  Musculoskeletal: Negative for back pain.    Health Maintenance  Topic Date Due  . INFLUENZA VACCINE  04/11/2019  . MAMMOGRAM  10/01/2020  . TETANUS/TDAP  06/23/2022  . COLONOSCOPY  11/06/2026  . DEXA SCAN  Completed  . Hepatitis C Screening  Completed  . PNA vac Low Risk Adult  Completed    Physical Exam: Vitals:   04/23/19 1435  BP: 130/80  Pulse: (!) 51  Temp: 97.9 F (36.6 C)  TempSrc: Oral  SpO2: 96%  Weight: 141 lb (64 kg)   Body mass index is 29.47 kg/m. Physical Exam Vitals signs reviewed.  Constitutional:      General: She is not in acute distress.  Appearance: Normal appearance. She is normal weight. She is not ill-appearing.  Cardiovascular:     Rate and Rhythm: Normal rate and regular rhythm.     Pulses: Normal pulses.     Heart sounds: Normal heart sounds. No murmur.  Pulmonary:     Effort: Pulmonary effort is normal. No respiratory distress.     Breath sounds: Normal breath sounds.  Abdominal:     General: Abdomen is flat. Bowel sounds are normal.     Palpations: Abdomen is soft. There is no mass.     Tenderness: There is no abdominal tenderness.  Genitourinary:    Comments: Urine orange and clear. No odor or sediment present Neurological:     Mental Status: She is alert.     Labs reviewed: Basic Metabolic Panel: Recent  Labs    10/13/18 1017 11/10/18 1005 02/23/19 1010  NA 140 139 140  K 3.7 4.0 4.1  CL 105 105 102  CO2 27 25 27   GLUCOSE 93 99 99  BUN 17 17 24   CREATININE 0.98 1.05* 1.13*  CALCIUM 9.2 9.2 9.9   Liver Function Tests: Recent Labs    09/22/18 1027 10/13/18 1017 11/10/18 1005 02/23/19 1010  AST 29 22 23 26   ALT 30 23 20 22   ALKPHOS 48 38 37*  --   BILITOT 0.6 0.7 0.7 0.9  PROT 7.1 7.1 7.5 7.2  ALBUMIN 3.5 3.8 4.0  --    No results for input(s): LIPASE, AMYLASE in the last 8760 hours. No results for input(s): AMMONIA in the last 8760 hours. CBC: Recent Labs    11/10/18 1005 11/24/18 1319 02/23/19 1010  WBC 3.7* 4.0 4.1  NEUTROABS 2.1 1.9 2,177  HGB 9.5* 11.4* 13.9  HCT 27.3* 34.2* 39.4  MCV 113.8* 120.8* 102.3*  PLT 97* 222 241   Lipid Panel: Recent Labs    09/15/18 1225 02/23/19 1010  CHOL 170 140  HDL 44* 47*  LDLCALC 99 70  TRIG 174* 143  CHOLHDL 3.9 3.0   Lab Results  Component Value Date   HGBA1C 5.6 02/23/2019    Procedures since last visit: No results found.  Assessment/Plan 1. Frequent urination - POC Urinalysis Dipstick - Urine Culture  2. Acute cystitis without hematuria - suspect urinary tract infection or cystitis - continue AZO and increased hydration - may add cranberry juice  - Cipro HCL 500 mg by mouth 2 times a day for 14 days, if symptoms persisit    Labs/tests ordered:  Urinalysis dipstick, urine culture Next appt:  08/26/2019

## 2019-04-23 NOTE — Patient Instructions (Addendum)
Continue to push fluids.  If your symptoms worsen over the weekend, start the cipro.  If not, hold off until your culture returns.    Urinary Tract Infection, Adult A urinary tract infection (UTI) is an infection of any part of the urinary tract. The urinary tract includes:  The kidneys.  The ureters.  The bladder.  The urethra. These organs make, store, and get rid of pee (urine) in the body. What are the causes? This is caused by germs (bacteria) in your genital area. These germs grow and cause swelling (inflammation) of your urinary tract. What increases the risk? You are more likely to develop this condition if:  You have a small, thin tube (catheter) to drain pee.  You cannot control when you pee or poop (incontinence).  You are female, and: ? You use these methods to prevent pregnancy: ? A medicine that kills sperm (spermicide). ? A device that blocks sperm (diaphragm). ? You have low levels of a female hormone (estrogen). ? You are pregnant.  You have genes that add to your risk.  You are sexually active.  You take antibiotic medicines.  You have trouble peeing because of: ? A prostate that is bigger than normal, if you are female. ? A blockage in the part of your body that drains pee from the bladder (urethra). ? A kidney stone. ? A nerve condition that affects your bladder (neurogenic bladder). ? Not getting enough to drink. ? Not peeing often enough.  You have other conditions, such as: ? Diabetes. ? A weak disease-fighting system (immune system). ? Sickle cell disease. ? Gout. ? Injury of the spine. What are the signs or symptoms? Symptoms of this condition include:  Needing to pee right away (urgently).  Peeing often.  Peeing small amounts often.  Pain or burning when peeing.  Blood in the pee.  Pee that smells bad or not like normal.  Trouble peeing.  Pee that is cloudy.  Fluid coming from the vagina, if you are female.  Pain in the  belly or lower back. Other symptoms include:  Throwing up (vomiting).  No urge to eat.  Feeling mixed up (confused).  Being tired and grouchy (irritable).  A fever.  Watery poop (diarrhea). How is this treated? This condition may be treated with:  Antibiotic medicine.  Other medicines.  Drinking enough water. Follow these instructions at home:  Medicines  Take over-the-counter and prescription medicines only as told by your doctor.  If you were prescribed an antibiotic medicine, take it as told by your doctor. Do not stop taking it even if you start to feel better. General instructions  Make sure you: ? Pee until your bladder is empty. ? Do not hold pee for a long time. ? Empty your bladder after sex. ? Wipe from front to back after pooping if you are a female. Use each tissue one time when you wipe.  Drink enough fluid to keep your pee pale yellow.  Keep all follow-up visits as told by your doctor. This is important. Contact a doctor if:  You do not get better after 1-2 days.  Your symptoms go away and then come back. Get help right away if:  You have very bad back pain.  You have very bad pain in your lower belly.  You have a fever.  You are sick to your stomach (nauseous).  You are throwing up. Summary  A urinary tract infection (UTI) is an infection of any part of the urinary  tract.  This condition is caused by germs in your genital area.  There are many risk factors for a UTI. These include having a small, thin tube to drain pee and not being able to control when you pee or poop.  Treatment includes antibiotic medicines for germs.  Drink enough fluid to keep your pee pale yellow. This information is not intended to replace advice given to you by your health care provider. Make sure you discuss any questions you have with your health care provider. Document Released: 02/13/2008 Document Revised: 08/14/2018 Document Reviewed: 03/06/2018 Elsevier  Patient Education  2020 [employer].

## 2019-04-25 LAB — URINE CULTURE
MICRO NUMBER:: 770025
SPECIMEN QUALITY:: ADEQUATE

## 2019-07-15 ENCOUNTER — Encounter: Payer: Self-pay | Admitting: Internal Medicine

## 2019-07-16 ENCOUNTER — Telehealth: Payer: Self-pay | Admitting: *Deleted

## 2019-07-16 ENCOUNTER — Encounter: Payer: Self-pay | Admitting: Gynecology

## 2019-07-16 NOTE — Telephone Encounter (Signed)
Called and left the patient a message to call the office back. She needs an appt for lab and to see Dr. Berline Lopes

## 2019-07-17 ENCOUNTER — Telehealth: Payer: Self-pay | Admitting: *Deleted

## 2019-07-17 NOTE — Telephone Encounter (Addendum)
Called the paitent and scheduled a lab and MD visit for next week. Patient requested her lab appt same day as visit.  Gave the policy for parking, no visitors and mask

## 2019-07-20 ENCOUNTER — Encounter: Payer: Self-pay | Admitting: Gynecologic Oncology

## 2019-07-20 NOTE — Progress Notes (Signed)
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 Large pelvic mass with heterogeneous appearance, measuring at least 15 centimeters in diameter. Although the findings favor enlarged uterus/fibroids, ovaries are not well seen and an ovarian mass should also be considered. Further characterization with pelvic ultrasound is recommended. 2. The bladder is completely decompressed. There is LEFT-sided hydronephrosis secondary to extrinsic compression from the pelvic mass. 3. Irregular thickening of the sigmoid colon, suspicious for colonic wall mass, measuring 1.3 centimeters. In this patient with a family history of colon cancer, further evaluation with colonoscopy is recommended. 4. Colonic diverticula without acute diverticulitis. 5. Moderate hiatal hernia.    05/15/2018 Imaging   MRI pelvis 16 cm complex cystic and solid mass which is centered in the left posterior adnexa and is contiguous with the uterus. This is suspicious for ovarian carcinoma, with differential diagnosis of atypical pedunculated fibroid with peripheral cystic degeneration. Surgical evaluation is recommended.  Other uterine fibroids measuring up to 4.8 cm.  No evidence of pelvic metastatic disease.    05/16/2018 Imaging   US pelvis 1. Large pelvic mass described in detail on MRI examination 1 day prior. 2. Slight ureteral prominence bilaterally. Mild fullness of the left renal collecting system. No obstructing focus evident. Suspect fullness of these structures due to ureteral  compression by the sizable pelvic mass.     05/22/2018 Tumor Marker   Patient's tumor was tested for the following markers: CA-125 Results of the tumor marker test revealed 122   05/27/2018 Surgery   Preoperative Diagnoses: Pelvic mass  Postoperative Diagnoses: left fallopian tube and ovary consistent with high-grade carcinoma of likely GYN origin. No evidence of metastatic disease.  Procedures: Exploratory laparotomy, total abdominal hysterectomy, bilateral salpingo-oophorectomy, peritoneal biopsies for ovarian cancer staging, bilateral pelvic lymphadenectomy, bilateral para-aortic lymphadenectomy, infracolic omentectomy, R0 resection   Surgeon: Marti Sleigh, MD  Assistants: Tona Sensing, MD - Fellow, Haroldine Laws, MD - Resident, Feliberto Harts, PA-S  Findings: On BME, the cervix is retracted anteriorly with no palpable adnexal fullness. There is no RV septum nodularity. Abdominal exam reveals a mobile large mass that extends to the umbilicus and nearly to the pelvic side walls. On abdominal entry, there was immediate green-brown pelvic fluid that appeared consistent with a spontaneous rupture of the cystic fluid-filled mass. The left ovary was a large cystic complex mass, roughly 15cm in size with smooth surface but containing friable tumor. The mass was densely adherent along the entire posterior wall of the uterus. The uterus had some small intramural fibroids and was roughly 10cm in total size. The right fallopian tube and ovary were normal appearing. The patient has smooth paracolic gutters, liver edge, diaphragm, spleen and stomach. The omentum was retracted but no evidence of nodularity or gross tumor. The anterior cul-de-sac was clear and the posterior-cul-de-sac peritoneum was clear, but the left ovarian mass wall was adherent to the rectosigmoid and cul-de-sac. There were no palpable pelvic or para-aortic adenopathy.   Of note, an the conclusion of the case, there was mild  RIGHT hydronephrosis noted. The ureter  was traced from the level of the right common iliac into the anterior utero-vesical ligament. There was an area at the pelvic brim were the hydronephrosis resolved to normal caliber. There was no tethering, stricture, or apparent injury to the ureter. Based on exam, this seemed consistent with packing and retraction causing some transient dilation that resolved with packing and retractor removal.   Intraoperative frozen section of the left ovary was consistent with high-grade carcinoma of GYN origin, possibly serous. There was no gross evidence of disease at the conclusion of the case, R0 resection.  Specimens: 1) Pelvic washings 2) Uterus with cervix  3) Left ovary and fallopian tube, IOFS c/w high-grade GYN carcinoma 4) Right fallopian tube and ovary 5) Peritoneal biopsies - anterior cul-de-sac peritoneum, posterior cul-de-sac peritoneum, left pelvic side wall peritoneum, right pelvic side wall peritoneum 6) Right and left pelvic lymph nodes 7) Right and left para-aortic lymph nodes 8) Omentum 9) Anaerobic/Aerobic culture - preliminary read is no organisms    05/27/2018 Pathology Results   A: Ovary and fallopian tube, left, salpingo-oophorectomy - Clear cell carcinoma of left ovary, size 14.0 cm, with necrosis  - Carcinoma is adherent to fallopian tube, consistent with surface involvement (stage pT1c2) - Focal endometriosis present - See synoptic report and comment  B: Uterus with cervix and right ovary and fallopian tube, hysterectomy and right salpingo-oophorectomy Cervix: Benign with Nabothian cysts Endometrium: Endometrial polyp (size 3.5 cm); inactive endometrium with cystic glandular changes Myometrium: Leiomyomata with hyalinization and calcification (size up to 4.2 cm); focal adenomyosis  Right ovary: Benign physiologic changes Right fallopian tube: Small paratubal cyst and no malignancy identified  C: Lymph nodes, right pelvic, regional  resection - Nine lymph nodes with no metastatic carcinoma identified (0/9)  D: Lymph nodes, left pelvic, regional resection - Five lymph nodes with no metastatic carcinoma identified (0/5)  E: Cul-de-sac, anterior, biopsy - Fibroadipose tissue with no carcinoma identified  F: Cul-de-sac, posterior, biopsy - Fibrous tissue with inflammation and no carcinoma identified  G: Pelvic sidewall, right, biopsy - Fibroadipose tissue with focal crushed CD10 positive cells, cannot exclude endometrial type stroma - No carcinoma identified  H: Pelvic sidewall, left, biopsy - Fibroadipose tissue with focal crushed cells, cannot exclude endometrial type stroma - No carcinoma identified  I: Omentum, omentectomy - Adipose and fibrovascular tissue consistent with omentum - No carcinoma identified  J: Lymph nodes, left periaortic, regional resection - Two small lymph nodes with no metastatic carcinoma identified (0/2)  K: Lymph nodes, right periaortic, regional resection - Three small lymph nodes with no metastatic carcinoma identified (0/3)  This electronic signature is attestation that the pathologist personally reviewed the submitted material(s) and the final diagnosis reflects that evaluation.    Synoptic Report     OVARY or FALLOPIAN TUBE or PRIMARY PERITONEUM(Ovary FT Perit - All Specimens)    SPECIMEN  Procedure:Total hysterectomy and bilateral salpingo-oophorectomy   Procedure:Omentectomy   Procedure:Peritonealbiopsies   Procedure:Peritoneal washing   :  Specimen Integrity of Left Ovary:Received largely intact with multiple areas of disruption   Tumor Site:Left ovary   Histologic Type:Clear cell carcinoma   Histologic Grade:High grade   :  Tumor Size:Greatest dimension in Centimeters (cm): 14.0 Centimeters (cm)  Additional Dimension in Centimeters (cm):10 Centimeters (cm)  Additional Dimension in  Centimeters (cm):7 Centimeters (cm)  Tumor Extent:  Ovarian Surface Involvement:Present   Laterality:Left   Other Tissue / Organ Involvement:Not identified   Peritoneal Ascitic Fluid:Negative for malignancy (normal / benign)   Pleural Fluid:Not submitted /  unknown   Accessory Findings:  LYMPH NODES  Regional Lymph Nodes:All lymph nodes negative for tumor cells   Number of Lymph Nodes Examined:19   Site(s):Right pelvic: 9   Site(s):Left pelvic: 5   Site(s):Right para-aortic: 3   Site(s):Left para-aortic: 2   PATHOLOGIC STAGE CLASSIFICATION (pTNM, AJCC 8th Edition)  Primary Tumor (pT):pT1c2   Regional Lymph Nodes (pN):pN0   FIGO STAGE  FIGO Stage:IC2   ADDITIONAL FINDINGS  Additional Pathologic Findings:Endometriosis   Additional Pathologic Findings:Benign fallopian tube with adherence to carcinoma; also see additional parts   Comment(s)  Comment(s):Also see diagnosis comment.   The frozen section diagnosis is confirmed for specimen A. Sections demonstrate a carcinoma with tubulocystic and papillary architecture with hyalinized cores, lined by cells with high grade nuclei, clear to eosinophilic cytoplasm, and nuclear hobnailing.  The morphologic features are most suggestive of clear cell carcinoma.  Immunohistochemical stains are performed, and demonstrate that the carcinoma is positive for CK7 and PAX8, consistent with gynecologic origin. It has patchy positivity for Napsin A, wild type p53 staining, and is negative for ER, supporting the diagnosis of clear cell carcinoma.   Sections of the tumor demonstrate carcinoma with adherence to fallopian tube surface, consistent with surface involvement.  Per the operative note, findings were also suggestive of preoperative rupture of the mass.  Given both these findings, the tumor is staged as a pT1c2 (FIGO IC2)            05/27/2018  Genetic Testing   Patient has genetic testing done for genetic disorder Results revealed patient has the following mutation(s): BRCA 1 on tissue block, neg blood   06/04/2018 Procedure   She had port placement   06/05/2018 Cancer Staging   Staging form: Ovary, Fallopian Tube, and Primary Peritoneal Carcinoma, AJCC 8th Edition - Pathologic: FIGO Stage IC2, calculated as Stage IC (pT1c2, pN0, cM0) - Signed by Heath Lark, MD on 06/05/2018   06/27/2018 Imaging   1. No evidence of residual/recurrent tumor in the pelvis. 2. No evidence of metastatic disease in the chest, abdomen or pelvis. 3. Left pelvic sidewall 6.3 x 5.0 cm simple fluid collection, compatible with postsurgical seroma or lymphocele. 4. Small hiatal hernia. 5. Aortic Atherosclerosis (ICD10-I70.0).   06/27/2018 Tumor Marker   Patient's tumor was tested for the following markers: CA-125 Results of the tumor marker test revealed 90.3   07/01/2018 - 10/14/2018 Chemotherapy   The patient had palonosetron (ALOXI) injection 0.25 mg, 0.25 mg, Intravenous,  Once, 6 of 6 cycles  Administration: 0.25 mg (07/01/2018), 0.25 mg (07/22/2018), 0.25 mg (08/12/2018), 0.25 mg (09/02/2018), 0.25 mg (09/23/2018), 0.25 mg (10/14/2018)    CARBOplatin (PARAPLATIN) 420 mg in sodium chloride 0.9 % 250 mL chemo infusion, 420 mg (98.3 % of original dose 425.4 mg), Intravenous,  Once, 6 of 6 cycles  Dose modification:   (original dose 425.4 mg, Cycle 1), 380 mg (original dose 425.4 mg, Cycle 5, Reason: Dose not tolerated), 380 mg (original dose 425.4 mg, Cycle 6, Reason: Dose not tolerated)  Administration: 420 mg (07/01/2018), 440 mg (07/22/2018), 380 mg (08/12/2018), 440 mg (09/02/2018), 380 mg (09/23/2018), 380 mg (10/14/2018)    PACLitaxel (TAXOL) 270 mg in sodium chloride 0.9 % 250 mL chemo infusion (> 40m/m2), 175 mg/m2 = 270 mg, Intravenous,  Once, 6 of 6 cycles  Administration: 270 mg (07/01/2018), 270 mg (07/22/2018), 270 mg (08/12/2018), 270 mg  (09/02/2018), 270 mg (09/23/2018), 270 mg (10/14/2018)    fosaprepitant (EMEND) 150 mg, dexamethasone (DECADRON) 12 mg in sodium  chloride 0.9 % 145 mL IVPB, , Intravenous,  Once, 6 of 6 cycles  Administration:  (07/01/2018),  (07/22/2018),  (08/12/2018),  (09/02/2018),  (09/23/2018),  (10/14/2018)  for chemotherapy treatment.     07/15/2018 Imaging   Vascular US Findings consistent with acute deep vein thrombosis involving the right internal jugular veins. She is placed on Xarelto   07/21/2018 Tumor Marker   Patient's tumor was tested for the following markers: CA-125 Results of the tumor marker test revealed 22.8   09/22/2018 Tumor Marker   Patient's tumor was tested for the following markers: CA-125 Results of the tumor marker test revealed 14.5   11/10/2018 Imaging   Status post hysterectomy and bilateral salpingo-oophorectomy.  4.9 cm improving postoperative seroma along the left pelvic sidewall.  No evidence of recurrent or metastatic disease.    11/10/2018 Tumor Marker   Patient's tumor was tested for the following markers: CA-125 Results of the tumor marker test revealed 12.6   11/24/2018 Procedure   Successful removal of implanted Port-A-Cath.     Interval History: Last visit was in June by phone with Dr. Fermin Schwab.  At that time the patient was doing very well with no significant symptoms.  She presents today and is overall feeling well.  She endorses a good appetite without nausea or vomiting.  She reports regular daily bowel function and denies any urinary symptoms.  She was recently treated for a UTI with Cipro and had resolution of her associated symptoms.  She continues to have hot flashes for which she is taking black cohosh and is managing.  Since surgery, she also endorses pain with intercourse which she describes as pain or tightness that she feels at the top of her vagina.  She denies any vaginal bleeding or discharge.  She denies any abdominal pain or bloating.   She has an occasional twinge in her left mid to lower abdomen which she has had since surgery and has not changed in frequency, duration or intensity.  Has had a mammogram within the last year.  Past Medical/Surgical History: Past Medical History:  Diagnosis Date  . Arthritis    in bilateral knees  . Cancer ([facility] [facility])    left ovarian cancer  . Family history of colon cancer   . Family history of ovarian cancer   . GERD (gastroesophageal reflux disease)   . History of DVT (deep vein thrombosis) 2019   had a catheter-related DVT during chemotherapy  . Hyperlipidemia   . Hypertension     Past Surgical History:  Procedure Laterality Date  . ABDOMINAL HYSTERECTOMY    . BIOPSY THYROID  2008  . COLONOSCOPY    . IR CV LINE INJECTION  07/15/2018  . IR REMOVAL TUN ACCESS W/ PORT W/O FL MOD SED  11/24/2018  . TONSILECTOMY, ADENOIDECTOMY, BILATERAL MYRINGOTOMY AND TUBES Bilateral 1957    Family History  Problem Relation Age of Onset  . Hypertension Father   . Diabetes Father   . Coronary artery disease Father   . Hyperlipidemia Father   . Colon cancer Father        54's  . Hypertension Mother   . Arthritis Mother   . Cancer Maternal Aunt        type unk  . Ovarian cancer Cousin 60  . Cancer Cousin        mouth    Social History   Socioeconomic History  . Marital status: Married    Spouse name: Louie Casa  . Number of children: 3  .  Years of education: Not on file  . Highest education level: Not on file  Occupational History  . Occupation: retired Solicitor  Social Needs  . Financial resource strain: Not hard at all  . Food insecurity    Worry: Never true    Inability: Never true  . Transportation needs    Medical: No    Non-medical: No  Tobacco Use  . Smoking status: Never Smoker  . Smokeless tobacco: Never Used  Substance and Sexual Activity  . Alcohol use: No  . Drug use: No  . Sexual activity: Not on file  Lifestyle  . Physical activity    Days per week: 0  days    Minutes per session: 0 min  . Stress: Only a little  Relationships  . Social Herbalist on phone: Never    Gets together: More than three times a week    Attends religious service: 1 to 4 times per year    Active member of club or organization: No    Attends meetings of clubs or organizations: Never    Relationship status: Married  Other Topics Concern  . Not on file  Social History Narrative   Diet:Unrestricted   Do you drink/eat things with caffeine? Rarely   Marital status: Married                             What year were you married? 1992   Do you live in a house, apartment, assisted living, condo, trailer, etc)? House   Is it one or more stories? 1   How many persons live in your home? 3   Do you have any pets in your home?  2 small dogs   Current or past profession: Chief of Staff   Do you exercise?                                                     Type & how often:   Do you have a living will? No   Do you have a DNR Form? No   Do you have a POA/HPOA forms? NO    Current Medications:  Current Outpatient Medications:  .  alendronate (FOSAMAX) 70 MG tablet, TAKE 1 TABLET EVERY WEEK, Disp: 12 tablet, Rfl: 3 .  BLACK COHOSH PO, Take 1 capsule by mouth daily., Disp: , Rfl:  .  Glucosamine HCl ([employer] GLUCOSAMINE PO), 2 tabs by mouth in the morning and 1 tab by mouth at night, Disp: , Rfl:  .  losartan-hydrochlorothiazide (HYZAAR) 50-12.5 MG tablet, Take 1 tablet by mouth daily., Disp: 90 tablet, Rfl: 1 .  Melatonin 5 MG TABS, Take 1 tablet by mouth at bedtime., Disp: , Rfl:  .  metoprolol tartrate (LOPRESSOR) 50 MG tablet, TAKE 1 TABLET TWICE DAILY, Disp: 180 tablet, Rfl: 1 .  Multiple Vitamin (MULTIVITAMIN) tablet, Take 1 tablet by mouth 2 (two) times daily. , Disp: , Rfl:  .  Omega-3 Fatty Acids (OMEGA-3 FISH OIL PO), Take 1 tablet by mouth 2 (two) times daily., Disp: , Rfl:  .  omeprazole (PRILOSEC) 20 MG capsule, TAKE 1 CAPSULE EVERY DAY, Disp: 90  capsule, Rfl: 3 .  simvastatin (ZOCOR) 20 MG tablet, TAKE 1 TABLET EVERY DAY, Disp: 90 tablet, Rfl: 1 .  vitamin C (ASCORBIC ACID) 500 MG tablet, Take 500 mg by mouth daily., Disp: , Rfl:  .  ciprofloxacin (CIPRO) 500 MG tablet, Take 1 tablet (500 mg total) by mouth 2 (two) times daily. (Patient not taking: Reported on 07/23/2019), Disp: 14 tablet, Rfl: 0  Review of Symptoms: Reports pain with intercourse, hot flashes, joint pain, and itching. Denies appetite changes, fevers, chills, fatigue, unexplained weight changes. Denies hearing loss, neck lumps or masses, mouth sores, ringing in ears, voice change, yellowing of the eyes, or vision problems. Denies cough, shortness of breath or wheezing. Denies chest pain, leg swelling or, or palpitations. Denies abdominal distention, abdominal pain, constipation, diarrhea, nausea, vomiting, or early satiety. Denies dysuria, frequency, hematuria, incontinence. Denies pelvic pain, vaginal bleeding or discharge. Denies rash or wounds. Denies dizziness, problems with walking, headaches, numbness except in the area near her umbilicus since surgery, denies seizures. Denies swollen glands or lymph nodes, denies bruising or bleeding easily. Denies anxiety, depression, confusion, or decreased concentration.  Physical Exam: BP 138/60 (BP Location: Right Arm, Patient Position: Sitting)   Pulse (!) 52   Temp 98.2 F (36.8 C) (Temporal)   Resp 16   Ht 4' 10" (1.473 m)   Wt 147 lb (66.7 kg)   SpO2 98%   BMI 30.72 kg/m  General: Alert, oriented, no acute distress. HEENT: sclera anicteric. Chest: Clear to auscultation bilaterally.  No wheezes or rhonchi.` Cardiovascular: Regular rate and rhythm, no murmurs. Abdomen: Soft, nontender.  Normoactive bowel sounds.  No masses or hepatosplenomegaly appreciated.  Well-healed scar. Extremities: Grossly normal range of motion.  Warm, well perfused.  No edema bilaterally. Skin: No rashes or lesions  noted. Lymphatics: No cervical, supraclavicular, or inguinal adenopathy. GU: Normal appearing external genitalia without erythema, excoriation, or lesions.  Speculum exam reveals atrophic vaginal mucosa, cuff intact and normal in appearance.  No lesions noted.  Bimanual exam reveals cuff intact, some fullness near the left aspect of the cuff which is also appreciated on rectovaginal exam, suspect small residual postoperative seroma versus lymphocele.  Rectovaginal exam confirms findings, no nodularity.  Laboratory & Radiologic Studies:  Ref Range & Units 44moago 870mogo 32m85moo 23m32mo 4mo632mo Cancer Antigen (CA) 125 0.0 - 38.1 U/mL 11.8  12.6 CM  14.2 CM  14.5 CM  21.1 CM          Assessment & Plan: JuditJANNETTA MASSEY 68 y.55 woman with Stage IC2 status post staging surgery in 05/2018 and completion of adjuvant chemotherapy in 10/2018 who presents for surveillance.  Patient is overall doing quite well with no evidence of disease on exam.  Ca1 25 was drawn and pending.  I will plan to release this result to the patient when available. Per [facility] surveillance guidelines, I recommended that we continue every 3-mon8-months with exam and Ca1 25 until 2 years out from treatment.  Patient had previously asked about seeing her gynecologist, Dr. Wein, Stann Mainlandthese visits.  I offered today that if she wanted to be seen there once a year, that I would be amenable to that.  I will see her the other 3 times a year until we are 2 years out from treatment and move to every 4-6 month visits.  I offered to send my note from today to Dr. Wein rStann Mainlandsting that a CA-125 be drawn at her next visit (patient thinks that she is due for a visit there in February) and sent to our office.  We discussed her  symptoms of dyspareunia.  This may be related to changes after surgery including scar tissue development, but it may also be related to the musculature of her pelvic floor.  We discussed the role of pelvic physical therapy and  I offered to send a referral.  The patient would like to think about this and will let me know.  She was given a handout regarding their services today.   Jeral Pinch, MD  Division of Gynecologic Oncology  Department of Obstetrics and Gynecology  [facility]

## 2019-07-22 ENCOUNTER — Other Ambulatory Visit: Payer: Self-pay | Admitting: Gynecologic Oncology

## 2019-07-22 DIAGNOSIS — C562 Malignant neoplasm of left ovary: Secondary | ICD-10-CM

## 2019-07-23 ENCOUNTER — Telehealth: Payer: Self-pay

## 2019-07-23 ENCOUNTER — Inpatient Hospital Stay (HOSPITAL_BASED_OUTPATIENT_CLINIC_OR_DEPARTMENT_OTHER): Payer: Medicare HMO | Admitting: Gynecologic Oncology

## 2019-07-23 ENCOUNTER — Encounter: Payer: Self-pay | Admitting: Gynecologic Oncology

## 2019-07-23 ENCOUNTER — Inpatient Hospital Stay: Payer: Medicare HMO | Attending: Gynecologic Oncology

## 2019-07-23 ENCOUNTER — Other Ambulatory Visit: Payer: Self-pay

## 2019-07-23 VITALS — BP 138/60 | HR 52 | Temp 98.2°F | Resp 16 | Ht <= 58 in | Wt 147.0 lb

## 2019-07-23 DIAGNOSIS — E785 Hyperlipidemia, unspecified: Secondary | ICD-10-CM | POA: Diagnosis not present

## 2019-07-23 DIAGNOSIS — Z9221 Personal history of antineoplastic chemotherapy: Secondary | ICD-10-CM

## 2019-07-23 DIAGNOSIS — K219 Gastro-esophageal reflux disease without esophagitis: Secondary | ICD-10-CM | POA: Diagnosis not present

## 2019-07-23 DIAGNOSIS — Z86718 Personal history of other venous thrombosis and embolism: Secondary | ICD-10-CM | POA: Diagnosis not present

## 2019-07-23 DIAGNOSIS — I10 Essential (primary) hypertension: Secondary | ICD-10-CM | POA: Diagnosis not present

## 2019-07-23 DIAGNOSIS — M1990 Unspecified osteoarthritis, unspecified site: Secondary | ICD-10-CM | POA: Diagnosis not present

## 2019-07-23 DIAGNOSIS — Z90722 Acquired absence of ovaries, bilateral: Secondary | ICD-10-CM

## 2019-07-23 DIAGNOSIS — Z79899 Other long term (current) drug therapy: Secondary | ICD-10-CM | POA: Diagnosis not present

## 2019-07-23 DIAGNOSIS — N941 Unspecified dyspareunia: Secondary | ICD-10-CM | POA: Insufficient documentation

## 2019-07-23 DIAGNOSIS — Z90710 Acquired absence of both cervix and uterus: Secondary | ICD-10-CM

## 2019-07-23 DIAGNOSIS — C562 Malignant neoplasm of left ovary: Secondary | ICD-10-CM

## 2019-07-23 NOTE — Patient Instructions (Signed)
It was a pleasure meeting you today!  I will release your CA-125 my chart once resulted.  We will send a message to Dr. Gwynne Edinger office regarding your visit there in February and asked that they send [place] a CA-125 result from that visit.  We will plan to see you back for follow-up and a CA-125 in May.  If you have abdominal bloating, pain, vaginal bleeding or discharge, or any other concerning symptoms, please call our clinic at [phone].

## 2019-07-23 NOTE — Telephone Encounter (Signed)
Faxed office note with message on cover sheet to Dr. Stann Mainland as requested by Dr. Berline Lopes.

## 2019-07-24 LAB — CA 125: Cancer Antigen (CA) 125: 10.8 U/mL (ref 0.0–38.1)

## 2019-08-25 ENCOUNTER — Other Ambulatory Visit: Payer: Medicare HMO

## 2019-08-25 ENCOUNTER — Other Ambulatory Visit: Payer: Self-pay

## 2019-08-25 DIAGNOSIS — Z1159 Encounter for screening for other viral diseases: Secondary | ICD-10-CM

## 2019-08-25 DIAGNOSIS — I10 Essential (primary) hypertension: Secondary | ICD-10-CM | POA: Diagnosis not present

## 2019-08-25 DIAGNOSIS — C562 Malignant neoplasm of left ovary: Secondary | ICD-10-CM | POA: Diagnosis not present

## 2019-08-25 DIAGNOSIS — E785 Hyperlipidemia, unspecified: Secondary | ICD-10-CM

## 2019-08-25 DIAGNOSIS — R739 Hyperglycemia, unspecified: Secondary | ICD-10-CM | POA: Diagnosis not present

## 2019-08-26 ENCOUNTER — Other Ambulatory Visit: Payer: Medicare HMO

## 2019-08-26 LAB — CBC WITH DIFFERENTIAL/PLATELET
Absolute Monocytes: 556 cells/uL (ref 200–950)
Basophils Absolute: 42 cells/uL (ref 0–200)
Basophils Relative: 0.8 %
Eosinophils Absolute: 151 cells/uL (ref 15–500)
Eosinophils Relative: 2.9 %
HCT: 42 % (ref 35.0–45.0)
Hemoglobin: 14.3 g/dL (ref 11.7–15.5)
Lymphs Abs: 1373 cells/uL (ref 850–3900)
MCH: 33.9 pg — ABNORMAL HIGH (ref 27.0–33.0)
MCHC: 34 g/dL (ref 32.0–36.0)
MCV: 99.5 fL (ref 80.0–100.0)
MPV: 9.1 fL (ref 7.5–12.5)
Monocytes Relative: 10.7 %
Neutro Abs: 3078 cells/uL (ref 1500–7800)
Neutrophils Relative %: 59.2 %
Platelets: 284 10*3/uL (ref 140–400)
RBC: 4.22 10*6/uL (ref 3.80–5.10)
RDW: 12 % (ref 11.0–15.0)
Total Lymphocyte: 26.4 %
WBC: 5.2 10*3/uL (ref 3.8–10.8)

## 2019-08-26 LAB — COMPLETE METABOLIC PANEL WITH GFR
AG Ratio: 1.6 (calc) (ref 1.0–2.5)
ALT: 22 U/L (ref 6–29)
AST: 22 U/L (ref 10–35)
Albumin: 4.3 g/dL (ref 3.6–5.1)
Alkaline phosphatase (APISO): 27 U/L — ABNORMAL LOW (ref 37–153)
BUN/Creatinine Ratio: 21 (calc) (ref 6–22)
BUN: 22 mg/dL (ref 7–25)
CO2: 27 mmol/L (ref 20–32)
Calcium: 9.2 mg/dL (ref 8.6–10.4)
Chloride: 104 mmol/L (ref 98–110)
Creat: 1.04 mg/dL — ABNORMAL HIGH (ref 0.60–0.93)
GFR, Est African American: 63 mL/min/{1.73_m2} (ref >=60)
GFR, Est Non African American: 54 mL/min/{1.73_m2} — ABNORMAL LOW (ref >=60)
Globulin: 2.7 g/dL (calc) (ref 1.9–3.7)
Glucose, Bld: 101 mg/dL — ABNORMAL HIGH (ref 65–99)
Potassium: 3.9 mmol/L (ref 3.5–5.3)
Sodium: 140 mmol/L (ref 135–146)
Total Bilirubin: 0.8 mg/dL (ref 0.2–1.2)
Total Protein: 7 g/dL (ref 6.1–8.1)

## 2019-08-26 LAB — LIPID PANEL
Cholesterol: 162 mg/dL (ref <200)
HDL: 50 mg/dL (ref >=50)
LDL Cholesterol (Calc): 79 mg/dL (calc)
Non-HDL Cholesterol (Calc): 112 mg/dL (calc) (ref <130)
Total CHOL/HDL Ratio: 3.2 (calc) (ref <5.0)
Triglycerides: 239 mg/dL — ABNORMAL HIGH (ref <150)

## 2019-08-26 LAB — HEMOGLOBIN A1C
Hgb A1c MFr Bld: 5.9 % of total Hgb — ABNORMAL HIGH (ref <5.7)
Mean Plasma Glucose: 123 (calc)
eAG (mmol/L): 6.8 (calc)

## 2019-08-26 LAB — HEPATITIS C ANTIBODY
Hepatitis C Ab: NONREACTIVE
SIGNAL TO CUT-OFF: 0.02 (ref <1.00)

## 2019-08-26 NOTE — Progress Notes (Signed)
Starchy fats (triglycerides) have gone way up.  Bad cholesterol ok at 79. Sugar average has gone up back into prediabetic range at 5.9.  Kidney function improved vs last check (has oscillated).  Electrolytes and liver tests ok.

## 2019-08-28 ENCOUNTER — Ambulatory Visit (INDEPENDENT_AMBULATORY_CARE_PROVIDER_SITE_OTHER): Payer: Medicare HMO | Admitting: Internal Medicine

## 2019-08-28 ENCOUNTER — Other Ambulatory Visit: Payer: Self-pay

## 2019-08-28 ENCOUNTER — Encounter: Payer: Self-pay | Admitting: Internal Medicine

## 2019-08-28 VITALS — BP 120/70 | HR 55 | Temp 98.3°F | Ht <= 58 in | Wt 151.0 lb

## 2019-08-28 DIAGNOSIS — R739 Hyperglycemia, unspecified: Secondary | ICD-10-CM | POA: Diagnosis not present

## 2019-08-28 DIAGNOSIS — I10 Essential (primary) hypertension: Secondary | ICD-10-CM | POA: Diagnosis not present

## 2019-08-28 DIAGNOSIS — G8929 Other chronic pain: Secondary | ICD-10-CM

## 2019-08-28 DIAGNOSIS — E781 Pure hyperglyceridemia: Secondary | ICD-10-CM

## 2019-08-28 DIAGNOSIS — Z8543 Personal history of malignant neoplasm of ovary: Secondary | ICD-10-CM | POA: Diagnosis not present

## 2019-08-28 DIAGNOSIS — M25551 Pain in right hip: Secondary | ICD-10-CM | POA: Diagnosis not present

## 2019-08-28 NOTE — Progress Notes (Signed)
Location:  [facility] clinic Provider:  Tex Conroy L. Mariea Clonts, D.O., C.M.D.  Goals of Care:  Advanced Directives 11/24/2018  Does Patient Have a Medical Advance Directive? Yes  Type of Paramedic of [place];Living will  Does patient want to make changes to medical advance directive? No - Patient declined  Copy of [place] in Chart? No - copy requested  Would patient like information on creating a medical advance directive? No - Patient declined     Chief Complaint  Patient presents with  . Medical Management of Chronic Issues    39mth follow-up    HPI: Patient is a 68 y.o. female seen today for medical management of chronic diseases.    Nothing new.  No concerns.  Blood pressure was great.    Triglycerides have increased--she's been baking cookies.  Says she's been moderate on them.  hba1c up some too into prediabetic range.    She's been getting around her house, but no deliberate exercise.  It hurst too much in her right hip--left also bothersome.  She had a long walk going on a train ride--she had to stop her right hip hurt some.  It will also bother her in the morning if she lays on her right side too long.  One day, it hurt more and she did tylenol and it eased off after moving in the morning.    Surgery was sept and chemo was done in feb.  She's done great.    Past Medical History:  Diagnosis Date  . Arthritis    in bilateral knees  . Cancer ([place])    left ovarian cancer  . Family history of colon cancer   . Family history of ovarian cancer   . GERD (gastroesophageal reflux disease)   . History of DVT (deep vein thrombosis) 2019   had a catheter-related DVT during chemotherapy  . Hyperlipidemia   . Hypertension     Past Surgical History:  Procedure Laterality Date  . ABDOMINAL HYSTERECTOMY    . BIOPSY THYROID  2008  . COLONOSCOPY    . IR CV LINE INJECTION  07/15/2018  . IR REMOVAL TUN ACCESS W/ PORT W/O FL MOD SED  11/24/2018    . TONSILECTOMY, ADENOIDECTOMY, BILATERAL MYRINGOTOMY AND TUBES Bilateral 1957    Allergies  Allergen Reactions  . Pollen Extract     Outpatient Encounter Medications as of 08/28/2019  Medication Sig  . alendronate (FOSAMAX) 70 MG tablet TAKE 1 TABLET EVERY WEEK  . BLACK COHOSH PO Take 1 capsule by mouth daily.  . Glucosamine HCl ([employer] GLUCOSAMINE PO) 2 tabs by mouth in the morning and 1 tab by mouth at night  . losartan-hydrochlorothiazide (HYZAAR) 50-12.5 MG tablet Take 1 tablet by mouth daily.  . Melatonin 5 MG TABS Take 1 tablet by mouth at bedtime.  . metoprolol tartrate (LOPRESSOR) 50 MG tablet TAKE 1 TABLET TWICE DAILY  . Multiple Vitamin (MULTIVITAMIN) tablet Take 1 tablet by mouth 2 (two) times daily.   . Omega-3 Fatty Acids (OMEGA-3 FISH OIL PO) Take 1 tablet by mouth 2 (two) times daily.  Marland Kitchen omeprazole (PRILOSEC) 20 MG capsule TAKE 1 CAPSULE EVERY DAY  . simvastatin (ZOCOR) 20 MG tablet TAKE 1 TABLET EVERY DAY  . vitamin C (ASCORBIC ACID) 500 MG tablet Take 500 mg by mouth daily.  . [DISCONTINUED] ciprofloxacin (CIPRO) 500 MG tablet Take 1 tablet (500 mg total) by mouth 2 (two) times daily. (Patient not taking: Reported on 07/23/2019)  No facility-administered encounter medications on file as of 08/28/2019.    Review of Systems:  Review of Systems  Constitutional: Negative for chills, fever and malaise/fatigue.  HENT: Negative for congestion, hearing loss and sore throat.   Eyes: Negative for blurred vision.       Glasses  Respiratory: Negative for cough and shortness of breath.   Cardiovascular: Negative for chest pain, palpitations and leg swelling.  Gastrointestinal: Negative for abdominal pain, constipation, heartburn, nausea and vomiting.  Genitourinary: Negative for dysuria.  Musculoskeletal: Positive for back pain and joint pain. Negative for falls.  Skin: Negative for itching and rash.  Neurological: Negative for dizziness and loss of consciousness.   Endo/Heme/Allergies: Does not bruise/bleed easily.  Psychiatric/Behavioral: Negative for depression and memory loss. The patient is not nervous/anxious and does not have insomnia.     Health Maintenance  Topic Date Due  . MAMMOGRAM  10/01/2020  . TETANUS/TDAP  06/23/2022  . COLONOSCOPY  11/06/2026  . INFLUENZA VACCINE  Completed  . DEXA SCAN  Completed  . Hepatitis C Screening  Completed  . PNA vac Low Risk Adult  Completed    Physical Exam: Vitals:   08/28/19 0831  BP: 120/70  Pulse: (!) 55  Temp: 98.3 F (36.8 C)  TempSrc: Oral  SpO2: 98%  Weight: 151 lb (68.5 kg)  Height: 4\' 10"  (1.473 m)   Body mass index is 31.56 kg/m. Physical Exam Vitals reviewed.  Constitutional:      General: She is not in acute distress.    Appearance: Normal appearance. She is not toxic-appearing.  HENT:     Head: Normocephalic and atraumatic.  Cardiovascular:     Rate and Rhythm: Normal rate and regular rhythm.     Pulses: Normal pulses.     Heart sounds: Normal heart sounds.  Pulmonary:     Effort: Pulmonary effort is normal.     Breath sounds: Normal breath sounds. No wheezing, rhonchi or rales.  Abdominal:     General: Bowel sounds are normal.  Musculoskeletal:        General: Tenderness present. No deformity. Normal range of motion.     Right lower leg: No edema.     Left lower leg: No edema.     Comments: Right hip tenderness over joint laterally  Skin:    General: Skin is warm and dry.  Neurological:     General: No focal deficit present.     Mental Status: She is alert and oriented to person, place, and time.     Gait: Gait normal.  Psychiatric:        Mood and Affect: Mood normal.        Behavior: Behavior normal.        Thought Content: Thought content normal.        Judgment: Judgment normal.     Labs reviewed: Basic Metabolic Panel: Recent Labs    11/10/18 1005 02/23/19 1010 08/25/19 0933  NA 139 140 140  K 4.0 4.1 3.9  CL 105 102 104  CO2 25 27 27    GLUCOSE 99 99 101*  BUN 17 24 22   CREATININE 1.05* 1.13* 1.04*  CALCIUM 9.2 9.9 9.2   Liver Function Tests: Recent Labs    09/22/18 1027 10/13/18 1017 11/10/18 1005 02/23/19 1010 08/25/19 0933  AST 29 22 23 26 22   ALT 30 23 20 22 22   ALKPHOS 48 38 37*  --   --   BILITOT 0.6 0.7 0.7 0.9 0.8  PROT 7.1  7.1 7.5 7.2 7.0  ALBUMIN 3.5 3.8 4.0  --   --    No results for input(s): LIPASE, AMYLASE in the last 8760 hours. No results for input(s): AMMONIA in the last 8760 hours. CBC: Recent Labs    11/24/18 1319 02/23/19 1010 08/25/19 0933  WBC 4.0 4.1 5.2  NEUTROABS 1.9 2,177 3,078  HGB 11.4* 13.9 14.3  HCT 34.2* 39.4 42.0  MCV 120.8* 102.3* 99.5  PLT 222 241 284   Lipid Panel: Recent Labs    09/15/18 1225 02/23/19 1010 08/25/19 0933  CHOL 170 140 162  HDL 44* 47* 50  LDLCALC 99 70 79  TRIG 174* 143 239*  CHOLHDL 3.9 3.0 3.2   Lab Results  Component Value Date   HGBA1C 5.9 (H) 08/25/2019    Assessment/Plan 1. Chronic right hip pain -she is not ready for PT or xrays at this point--not bothersome enough on a regular basis--remains intermittent at this point -advised to use topicals and tylenol for the pain in instructions  2. Hyperglycemia -worse lately with her cookie eating at the holiday  3. Essential hypertension, benign -bp at goal, cont same dose  4. Pure hypertriglyceridemia -cut down on sweets and starchy carbs as this had trended up  5. History of ovarian cancer -no new symptosm, gets rare twinges of discomfort ever since tx  Labs/tests ordered:   Lab Orders  No laboratory test(s) ordered today  has just had labs, has CPE next month and AWV  Next appt:  09/21/2019  Wrenly Lauritsen L. Marilyn Nihiser, D.O. [facility] [address],  [address] Cell Phone (Mon-Fri 8am-5pm):  [phone] On Call:  [phone] & follow prompts after 5pm & weekends Office Phone:  [phone] Office Fax:   [phone]

## 2019-08-28 NOTE — Patient Instructions (Addendum)
You may use tylenol or topicals like voltaren gel or the lidocaine or capsaicin for the right hip pain.  Let me know if it bothers you enough that you want physical therapy or imaging done.

## 2019-08-31 ENCOUNTER — Ambulatory Visit: Payer: Medicare HMO | Admitting: Internal Medicine

## 2019-09-05 ENCOUNTER — Other Ambulatory Visit: Payer: Self-pay | Admitting: Internal Medicine

## 2019-09-21 ENCOUNTER — Encounter: Payer: Medicare HMO | Admitting: Family

## 2019-09-21 ENCOUNTER — Encounter: Payer: Medicare HMO | Admitting: Internal Medicine

## 2019-09-21 ENCOUNTER — Other Ambulatory Visit: Payer: Self-pay

## 2019-09-21 ENCOUNTER — Ambulatory Visit (INDEPENDENT_AMBULATORY_CARE_PROVIDER_SITE_OTHER): Payer: Medicare HMO | Admitting: Internal Medicine

## 2019-09-21 ENCOUNTER — Encounter: Payer: Self-pay | Admitting: Internal Medicine

## 2019-09-21 VITALS — BP 120/78 | HR 53 | Temp 97.8°F | Ht <= 58 in | Wt 151.1 lb

## 2019-09-21 DIAGNOSIS — I10 Essential (primary) hypertension: Secondary | ICD-10-CM

## 2019-09-21 DIAGNOSIS — Z0000 Encounter for general adult medical examination without abnormal findings: Secondary | ICD-10-CM

## 2019-09-21 DIAGNOSIS — E781 Pure hyperglyceridemia: Secondary | ICD-10-CM | POA: Diagnosis not present

## 2019-09-21 DIAGNOSIS — K219 Gastro-esophageal reflux disease without esophagitis: Secondary | ICD-10-CM | POA: Diagnosis not present

## 2019-09-21 MED ORDER — SIMVASTATIN 20 MG PO TABS: 20.0000 mg | ORAL_TABLET | Freq: Every day | ORAL | 1 refills | Status: DC

## 2019-09-21 MED ORDER — METOPROLOL TARTRATE 50 MG PO TABS: 50.0000 mg | ORAL_TABLET | Freq: Two times a day (BID) | ORAL | 1 refills | Status: DC

## 2019-09-21 MED ORDER — LOSARTAN POTASSIUM-HCTZ 50-12.5 MG PO TABS: 1.0000 | ORAL_TABLET | Freq: Every day | ORAL | 1 refills | Status: DC

## 2019-09-21 MED ORDER — OMEPRAZOLE 20 MG PO CPDR: 20.0000 mg | DELAYED_RELEASE_CAPSULE | Freq: Every day | ORAL | 1 refills | Status: DC

## 2019-09-21 NOTE — Progress Notes (Signed)
Location:  [facility] clinic  Provider: Dr. Hollace Kinnier  Goals of Care:  Advanced Directives 09/21/2019  Does Patient Have a Medical Advance Directive? Yes  Type of Advance Directive -  Does patient want to make changes to medical advance directive? No - Patient declined  Copy of [place] in Chart? -  Would patient like information on creating a medical advance directive? -     Chief Complaint  Patient presents with  . Annual Exam    follow up for annual physical exam     HPI: Patient is a 69 y.o. female seen today for annual wellness visit.   EKG results reviewed with patient.   Her main complaint is bilateral knee pain and right hip pain. In the past she has seen Dr. Marlou Sa for her knee pain. Has had cortisone injection and films done in past. Has been told her knees are "bone on bone." Her left knee is more painful than right. She does not take tylenol daily, only when pain is severe. Pain is worst after prolonged sitting. Pain is described as stiffness that improves with movement. Pain in right hip same as left knee. Other interventions she has tried is topical pain relievers and heating pad. No recent falls or injuries. States she can manage the pain  She received a notice from [facility] stating her meds need to be refilled. Asking for a follow up on this.   Still taking prilosec daily. She will sometimes have reflux, when this occurs she will eat or drink something to settle it. Avoid triggers foods like spicy foods or peanut butter.     Past Medical History:  Diagnosis Date  . Arthritis    in bilateral knees  . Cancer ([place])    left ovarian cancer  . Family history of colon cancer   . Family history of ovarian cancer   . GERD (gastroesophageal reflux disease)   . History of DVT (deep vein thrombosis) 2019   had a catheter-related DVT during chemotherapy  . Hyperlipidemia   . Hypertension     Past Surgical History:  Procedure Laterality Date   . ABDOMINAL HYSTERECTOMY    . BIOPSY THYROID  2008  . COLONOSCOPY    . IR CV LINE INJECTION  07/15/2018  . IR REMOVAL TUN ACCESS W/ PORT W/O FL MOD SED  11/24/2018  . TONSILECTOMY, ADENOIDECTOMY, BILATERAL MYRINGOTOMY AND TUBES Bilateral 1957    Allergies  Allergen Reactions  . Pollen Extract     Outpatient Encounter Medications as of 09/21/2019  Medication Sig  . alendronate (FOSAMAX) 70 MG tablet TAKE 1 TABLET EVERY WEEK  . BLACK COHOSH PO Take 1 capsule by mouth daily.  . Glucosamine HCl ([facility] GLUCOSAMINE PO) 2 tabs by mouth in the morning and 1 tab by mouth at night  . losartan-hydrochlorothiazide (HYZAAR) 50-12.5 MG tablet Take 1 tablet by mouth daily.  . Melatonin 5 MG TABS Take 1 tablet by mouth at bedtime.  . metoprolol tartrate (LOPRESSOR) 50 MG tablet TAKE 1 TABLET TWICE DAILY  . Multiple Vitamin (MULTIVITAMIN) tablet Take 1 tablet by mouth 2 (two) times daily.   . Omega-3 Fatty Acids (OMEGA-3 FISH OIL PO) Take 1 tablet by mouth 2 (two) times daily.  Marland Kitchen omeprazole (PRILOSEC) 20 MG capsule TAKE 1 CAPSULE EVERY DAY  . simvastatin (ZOCOR) 20 MG tablet TAKE 1 TABLET EVERY DAY  . vitamin C (ASCORBIC ACID) 500 MG tablet Take 500 mg by mouth daily.   No facility-administered  encounter medications on file as of 09/21/2019.    Review of Systems:  Review of Systems  Constitutional: Negative for activity change, appetite change and fatigue.  HENT: Negative for dental problem, hearing loss and trouble swallowing.   Eyes: Negative for photophobia and visual disturbance.  Respiratory: Negative for cough, shortness of breath and wheezing.   Cardiovascular: Negative for chest pain, palpitations and leg swelling.  Gastrointestinal: Negative for abdominal pain, constipation, diarrhea and nausea.  Endocrine: Negative for polydipsia, polyphagia and polyuria.  Genitourinary: Negative for dysuria, frequency, hematuria and vaginal bleeding.  Musculoskeletal: Positive for arthralgias.        Right hip pain, left knee pain  Skin: Negative.   Neurological: Negative for dizziness, weakness and headaches.  Psychiatric/Behavioral: Negative for dysphoric mood and sleep disturbance. The patient is not nervous/anxious.   All other systems reviewed and are negative.   Health Maintenance  Topic Date Due  . MAMMOGRAM  10/01/2020  . TETANUS/TDAP  06/23/2022  . COLONOSCOPY  11/06/2026  . INFLUENZA VACCINE  Completed  . DEXA SCAN  Completed  . Hepatitis C Screening  Completed  . PNA vac Low Risk Adult  Completed    Physical Exam: Vitals:   09/21/19 1045  BP: 120/78  Pulse: (!) 53  Temp: 97.8 F (36.6 C)  TempSrc: Temporal  SpO2: 97%  Weight: 151 lb 1.6 oz (68.5 kg)  Height: 4\' 10"  (1.473 m)   Body mass index is 31.58 kg/m. Physical Exam Vitals reviewed.  Constitutional:      Appearance: Normal appearance. She is normal weight.  HENT:     Head: Normocephalic.     Right Ear: There is no impacted cerumen.     Left Ear: There is no impacted cerumen.     Nose: Nose normal.     Mouth/Throat:     Pharynx: No oropharyngeal exudate or posterior oropharyngeal erythema.  Eyes:     Extraocular Movements: Extraocular movements intact.     Pupils: Pupils are equal, round, and reactive to light.  Neck:     Thyroid: No thyroid mass, thyromegaly or thyroid tenderness.  Cardiovascular:     Rate and Rhythm: Normal rate and regular rhythm.     Pulses: Normal pulses.     Heart sounds: Normal heart sounds. No murmur.  Pulmonary:     Effort: Pulmonary effort is normal. No respiratory distress.     Breath sounds: Normal breath sounds. No wheezing.  Abdominal:     General: Bowel sounds are normal. There is no distension.     Palpations: Abdomen is soft. There is no mass.     Tenderness: There is no abdominal tenderness.  Musculoskeletal:        General: Tenderness present.     Right hip: No tenderness or crepitus. Normal range of motion.     Right lower leg: No edema.     Left  lower leg: No edema.       Legs:  Skin:    General: Skin is warm and dry.     Capillary Refill: Capillary refill takes less than 2 seconds.  Neurological:     General: No focal deficit present.     Mental Status: She is alert. Mental status is at baseline. She is disoriented.  Psychiatric:        Mood and Affect: Mood normal.        Behavior: Behavior normal.        Thought Content: Thought content normal.  Judgment: Judgment normal.     Labs reviewed: Basic Metabolic Panel: Recent Labs    11/10/18 1005 02/23/19 1010 08/25/19 0933  NA 139 140 140  K 4.0 4.1 3.9  CL 105 102 104  CO2 25 27 27   GLUCOSE 99 99 101*  BUN 17 24 22   CREATININE 1.05* 1.13* 1.04*  CALCIUM 9.2 9.9 9.2   Liver Function Tests: Recent Labs    09/22/18 1027 10/13/18 1017 11/10/18 1005 02/23/19 1010 08/25/19 0933  AST 29 22 23 26 22   ALT 30 23 20 22 22   ALKPHOS 48 38 37*  --   --   BILITOT 0.6 0.7 0.7 0.9 0.8  PROT 7.1 7.1 7.5 7.2 7.0  ALBUMIN 3.5 3.8 4.0  --   --    No results for input(s): LIPASE, AMYLASE in the last 8760 hours. No results for input(s): AMMONIA in the last 8760 hours. CBC: Recent Labs    11/24/18 1319 02/23/19 1010 08/25/19 0933  WBC 4.0 4.1 5.2  NEUTROABS 1.9 2,177 3,078  HGB 11.4* 13.9 14.3  HCT 34.2* 39.4 42.0  MCV 120.8* 102.3* 99.5  PLT 222 241 284   Lipid Panel: Recent Labs    02/23/19 1010 08/25/19 0933  CHOL 140 162  HDL 47* 50  LDLCALC 70 79  TRIG 143 239*  CHOLHDL 3.0 3.2   Lab Results  Component Value Date   HGBA1C 5.9 (H) 08/25/2019    Procedures since last visit: No results found.  Assessment/Plan 1. Annual physical exam - EKG 12-Lead - EKG- normal sinus rhythm with PVC, no ischemia or infarct  2. Gastroesophageal reflux disease - stable, not progressive - continue to use prilosec 20 mg daily - avoid trigger foods  3. Essential hypertension, benign - bp at goal <150/90 - continue losartan-HCTZ and metoprolol regimen  daily - continue following low sodium diet - cbc with differential/platelets- future - complete metabolic panel with GFR- future  4. Pure hypertriglyceridemia - triglycerides 239 in December 2020 - recommend diet low in fat and fried food - recommend limiting alcohol intake - continue current statin therapy - lipid panel- future    Labs/tests ordered:  Cbc with differential/platelets, complete metabolic panel with GFR, lipid panel, hemoglobin A1C Next appt:  6 month follow up

## 2019-09-21 NOTE — Progress Notes (Signed)
Normal sinus rhythm with PVC (extra beat).  No acute ischemia or infarct.

## 2019-09-23 ENCOUNTER — Other Ambulatory Visit: Payer: Self-pay | Admitting: Internal Medicine

## 2019-09-23 DIAGNOSIS — E781 Pure hyperglyceridemia: Secondary | ICD-10-CM

## 2019-11-03 DIAGNOSIS — Z8543 Personal history of malignant neoplasm of ovary: Secondary | ICD-10-CM | POA: Diagnosis not present

## 2019-11-03 DIAGNOSIS — Z6831 Body mass index (BMI) 31.0-31.9, adult: Secondary | ICD-10-CM | POA: Diagnosis not present

## 2019-11-03 DIAGNOSIS — Z01419 Encounter for gynecological examination (general) (routine) without abnormal findings: Secondary | ICD-10-CM | POA: Diagnosis not present

## 2019-11-03 DIAGNOSIS — N959 Unspecified menopausal and perimenopausal disorder: Secondary | ICD-10-CM | POA: Diagnosis not present

## 2019-11-06 ENCOUNTER — Encounter: Payer: Self-pay | Admitting: Gynecologic Oncology

## 2019-11-19 DIAGNOSIS — Z1231 Encounter for screening mammogram for malignant neoplasm of breast: Secondary | ICD-10-CM | POA: Diagnosis not present

## 2019-11-19 LAB — HM MAMMOGRAPHY

## 2019-12-03 ENCOUNTER — Encounter: Payer: Self-pay | Admitting: Internal Medicine

## 2019-12-20 IMAGING — CT CT ABD-PELV W/ CM
2 of 6 series · 14 of 36 positions shown, 17 images · IV contrast (omnipaque)
Comparison: 05/15/2018 MRI pelvis.  04/29/2018 CT abdomen/pelvis.

CLINICAL DATA: Status post TAHBSO and infracolic omentectomy
05/27/2018 for clear cell carcinoma of the left ovary. Staging
evaluation.

EXAM:
CT CHEST, ABDOMEN, AND PELVIS WITH CONTRAST
TECHNIQUE: Multidetector CT imaging of the chest, abdomen and pelvis was
performed following the standard protocol during bolus
administration of intravenous contrast.
CONTRAST:  100mL OMNIPAQUE IOHEXOL 300 MG/ML  SOLN

[Series 6: coronals · coronal · 0.80mm/px · 3 of 141 slices shown]
[im 29/141  lung]
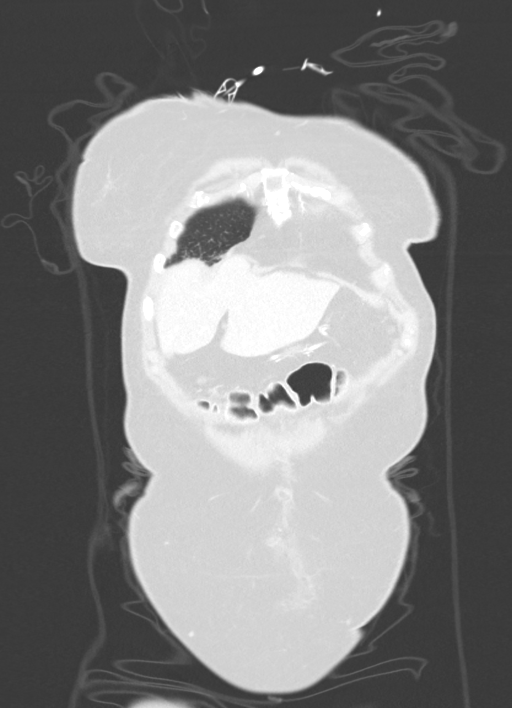
[im 57/141  lung]
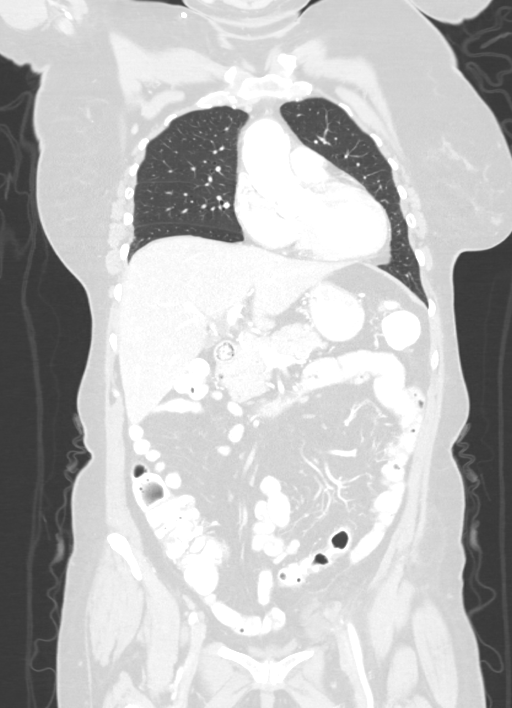
[im 85/141  lung]
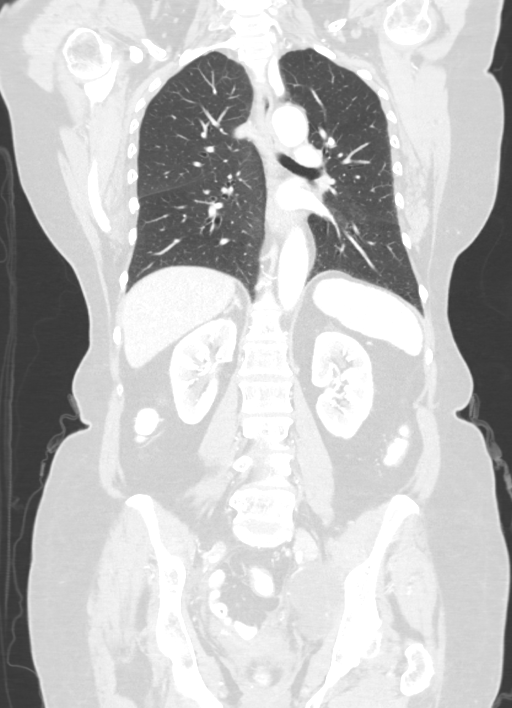

[Series 8: lung · axial · 0.71mm/px · z∈[-566,-58]mm · 11 of 288 slices shown, 14 images]
[im 17/288  mediastinal]
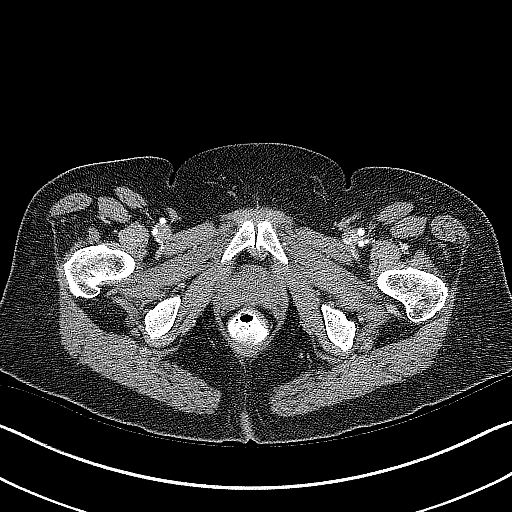
[im 17/288  lung]
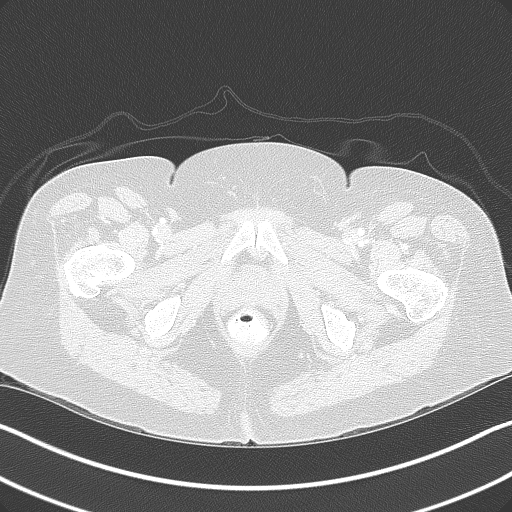
[im 51/288  lung]
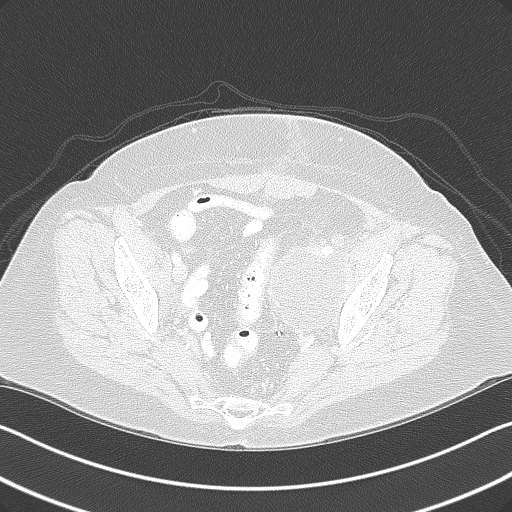
[im 68/288  lung]
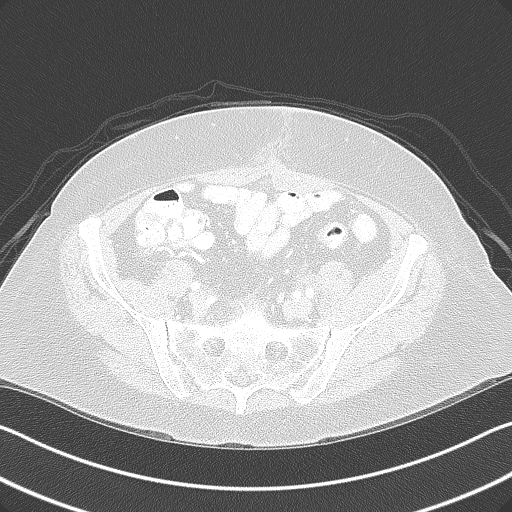
[im 102/288  lung]
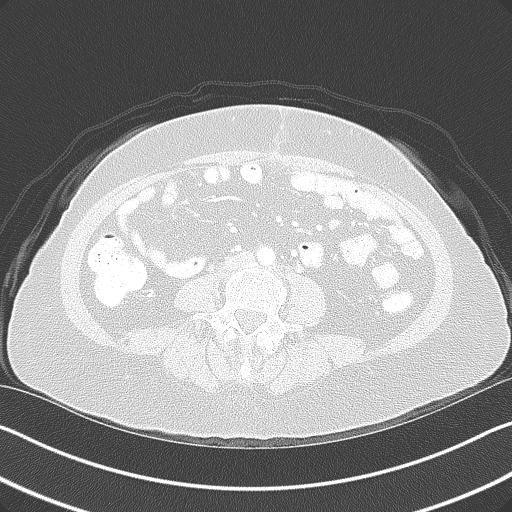
[im 119/288  mediastinal]
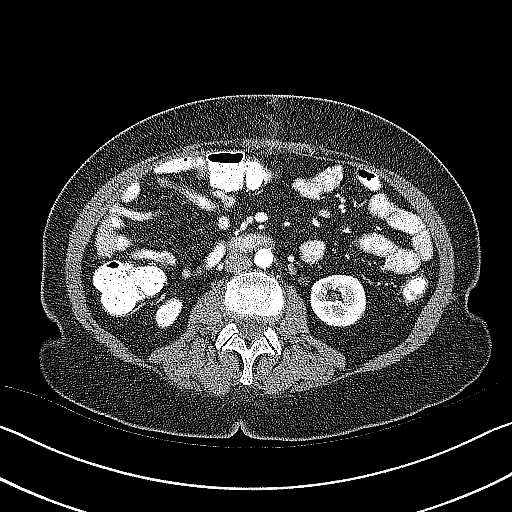
[im 119/288  lung]
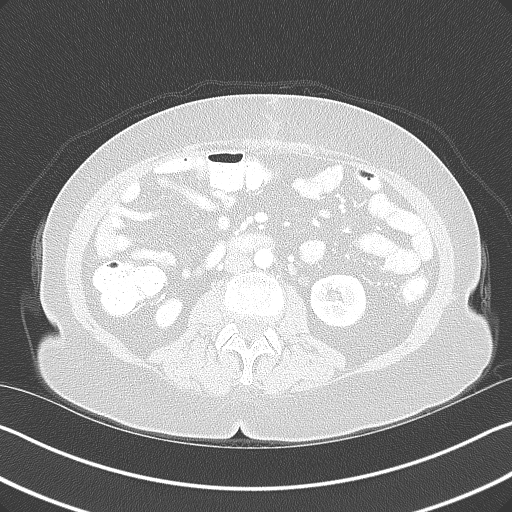
[im 152/288  lung]
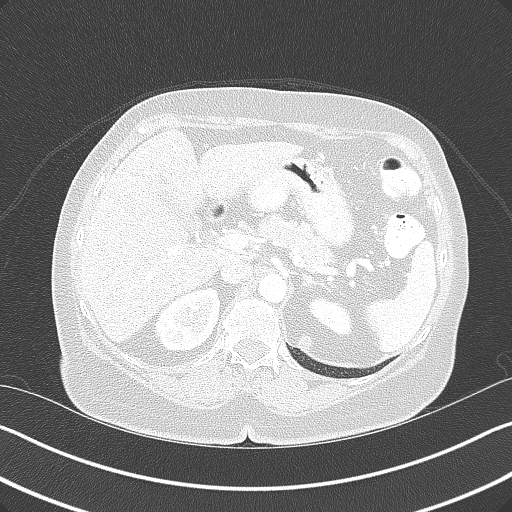
[im 169/288  lung]
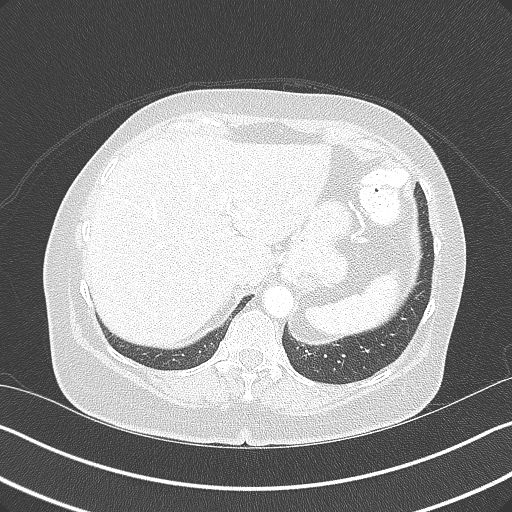
[im 186/288  lung]
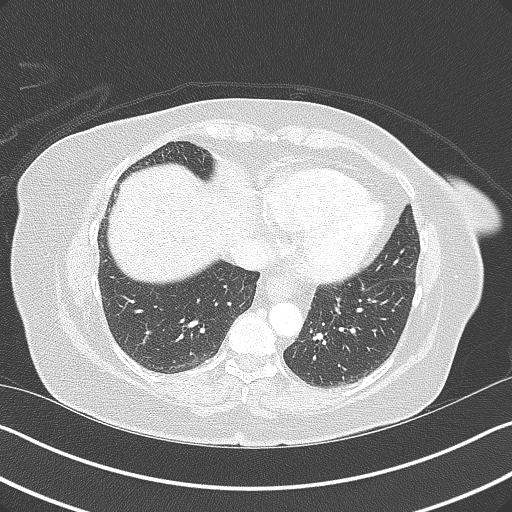
[im 220/288  mediastinal]
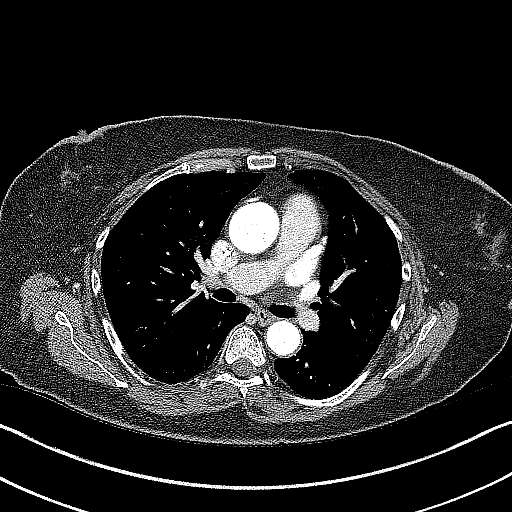
[im 220/288  lung]
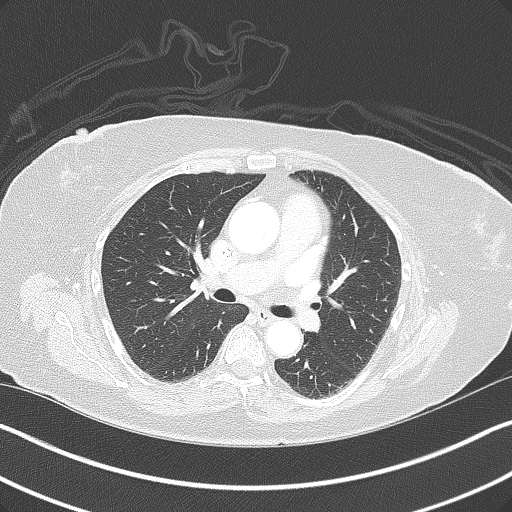
[im 237/288  lung]
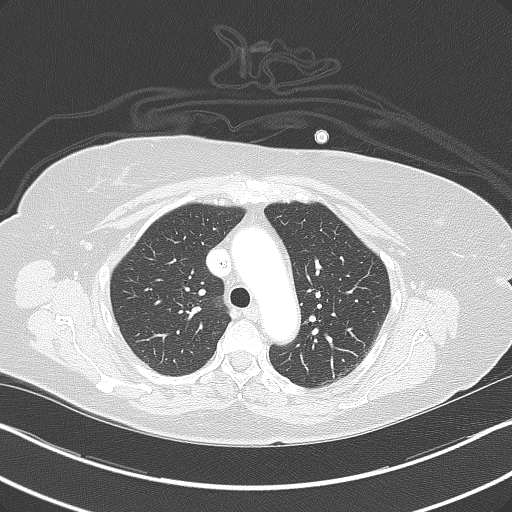
[im 271/288  lung]
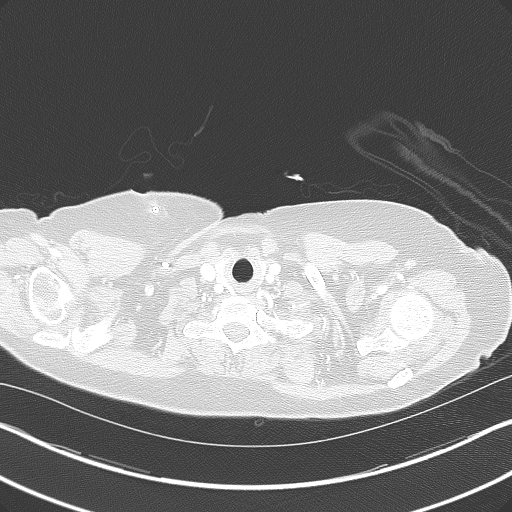

[14 of 36 positions shown; findings below may reference images not displayed]

FINDINGS: CT CHEST FINDINGS

Cardiovascular: Normal heart size. No significant pericardial
effusion/thickening. Right internal jugular MediPort terminates at
the cavoatrial junction. Minimal atherosclerotic nonaneurysmal
thoracic aorta. Normal caliber pulmonary arteries. No central
pulmonary emboli.

Mediastinum/Nodes: Dominant hypodense 1.3 cm right thyroid lobe
nodule. Unremarkable esophagus. No pathologically enlarged axillary,
mediastinal or hilar lymph nodes.

Lungs/Pleura: No pneumothorax. No pleural effusion. No acute
consolidative airspace disease, lung masses or significant pulmonary
nodules.

Musculoskeletal: No aggressive appearing focal osseous lesions.
Minimal thoracic spondylosis.

CT ABDOMEN PELVIS FINDINGS

Hepatobiliary: Normal liver with no liver mass. Normal gallbladder
with no radiopaque cholelithiasis. No biliary ductal dilatation.

Pancreas: Normal, with no mass or duct dilation.

Spleen: Normal size. No mass.

Adrenals/Urinary Tract: Normal adrenals. Normal kidneys with no
hydronephrosis and no renal mass. Normal bladder.

Stomach/Bowel: Small hiatal hernia. Otherwise normal nondistended
stomach. Normal caliber small bowel with no small bowel wall
thickening. Normal appendix. No definite large bowel wall
thickening. No diverticulosis. No acute pericolonic fat stranding.
Oral contrast transits to the rectum.

Vascular/Lymphatic: Normal caliber abdominal aorta. Patent portal,
splenic, hepatic and renal veins. No pathologically enlarged lymph
nodes in the abdomen or pelvis.

Reproductive: Interval hysterectomy and bilateral
salpingo-oophorectomy. No adnexal masses. Left pelvic sidewall 6.3 x
5.0 cm simple fluid collection (series 2/image 99).

Other: No pneumoperitoneum. No ascites. Vertical subcutaneous
laparotomy scar with no superficial fluid collections.

Musculoskeletal: No aggressive appearing focal osseous lesions.
Stable L3 and L5 vertebral hemangiomas. Mild lumbar spondylosis.
IMPRESSION: 1. No evidence of residual/recurrent tumor in the pelvis.
2. No evidence of metastatic disease in the chest, abdomen or
pelvis.
3. Left pelvic sidewall 6.3 x 5.0 cm simple fluid collection,
compatible with postsurgical seroma or lymphocele.
4. Small hiatal hernia.
5.  Aortic Atherosclerosis ([id number]).

## 2020-01-22 NOTE — Progress Notes (Signed)
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 Large pelvic mass with heterogeneous appearance, measuring at least 15 centimeters in diameter. Although the findings favor enlarged uterus/fibroids, ovaries are not well seen and an ovarian mass should also be considered. Further characterization with pelvic ultrasound is recommended. 2. The bladder is completely decompressed. There is LEFT-sided hydronephrosis secondary to extrinsic compression from the pelvic mass. 3. Irregular thickening of the sigmoid colon, suspicious for colonic wall mass, measuring 1.3 centimeters. In this patient with a family history of colon cancer, further evaluation with colonoscopy is recommended. 4. Colonic diverticula without acute diverticulitis. 5. Moderate hiatal hernia.    05/15/2018 Imaging   MRI pelvis 16 cm complex cystic and solid mass which is centered in the left posterior adnexa and is contiguous with the uterus. This is suspicious for ovarian carcinoma, with differential diagnosis of atypical pedunculated fibroid with peripheral cystic degeneration. Surgical evaluation is recommended.  Other uterine fibroids measuring up to 4.8 cm.  No evidence of pelvic metastatic disease.    05/16/2018 Imaging   US pelvis 1. Large pelvic mass described in detail on MRI examination 1 day prior. 2. Slight ureteral prominence bilaterally. Mild fullness of the left renal collecting system. No obstructing focus evident. Suspect fullness of these structures due to ureteral  compression by the sizable pelvic mass.     05/22/2018 Tumor Marker   Patient's tumor was tested for the following markers: CA-125 Results of the tumor marker test revealed 122   05/27/2018 Surgery   Preoperative Diagnoses: Pelvic mass  Postoperative Diagnoses: left fallopian tube and ovary consistent with high-grade carcinoma of likely GYN origin. No evidence of metastatic disease.  Procedures: Exploratory laparotomy, total abdominal hysterectomy, bilateral salpingo-oophorectomy, peritoneal biopsies for ovarian cancer staging, bilateral pelvic lymphadenectomy, bilateral para-aortic lymphadenectomy, infracolic omentectomy, R0 resection   Surgeon: Marti Sleigh, MD  Assistants: Tona Sensing, MD - Fellow, Haroldine Laws, MD - Resident, Feliberto Harts, PA-S  Findings: On BME, the cervix is retracted anteriorly with no palpable adnexal fullness. There is no RV septum nodularity. Abdominal exam reveals a mobile large mass that extends to the umbilicus and nearly to the pelvic side walls. On abdominal entry, there was immediate green-brown pelvic fluid that appeared consistent with a spontaneous rupture of the cystic fluid-filled mass. The left ovary was a large cystic complex mass, roughly 15cm in size with smooth surface but containing friable tumor. The mass was densely adherent along the entire posterior wall of the uterus. The uterus had some small intramural fibroids and was roughly 10cm in total size. The right fallopian tube and ovary were normal appearing. The patient has smooth paracolic gutters, liver edge, diaphragm, spleen and stomach. The omentum was retracted but no evidence of nodularity or gross tumor. The anterior cul-de-sac was clear and the posterior-cul-de-sac peritoneum was clear, but the left ovarian mass wall was adherent to the rectosigmoid and cul-de-sac. There were no palpable pelvic or para-aortic adenopathy.   Of note, an the conclusion of the case, there was mild  RIGHT hydronephrosis noted. The ureter was  traced from the level of the right common iliac into the anterior utero-vesical ligament. There was an area at the pelvic brim were the hydronephrosis resolved to normal caliber. There was no tethering, stricture, or apparent injury to the ureter. Based on exam, this seemed consistent with packing and retraction causing some transient dilation that resolved with packing and retractor removal.   Intraoperative frozen section of the left ovary was consistent with high-grade carcinoma of GYN origin, possibly serous. There was no gross evidence of disease at the conclusion of the case, R0 resection.  Specimens: 1) Pelvic washings 2) Uterus with cervix  3) Left ovary and fallopian tube, IOFS c/w high-grade GYN carcinoma 4) Right fallopian tube and ovary 5) Peritoneal biopsies - anterior cul-de-sac peritoneum, posterior cul-de-sac peritoneum, left pelvic side wall peritoneum, right pelvic side wall peritoneum 6) Right and left pelvic lymph nodes 7) Right and left para-aortic lymph nodes 8) Omentum 9) Anaerobic/Aerobic culture - preliminary read is no organisms    05/27/2018 Pathology Results   A: Ovary and fallopian tube, left, salpingo-oophorectomy - Clear cell carcinoma of left ovary, size 14.0 cm, with necrosis  - Carcinoma is adherent to fallopian tube, consistent with surface involvement (stage pT1c2) - Focal endometriosis present - See synoptic report and comment  B: Uterus with cervix and right ovary and fallopian tube, hysterectomy and right salpingo-oophorectomy Cervix: Benign with Nabothian cysts Endometrium: Endometrial polyp (size 3.5 cm); inactive endometrium with cystic glandular changes Myometrium: Leiomyomata with hyalinization and calcification (size up to 4.2 cm); focal adenomyosis  Right ovary: Benign physiologic changes Right fallopian tube: Small paratubal cyst and no malignancy identified  C: Lymph nodes, right pelvic, regional  resection - Nine lymph nodes with no metastatic carcinoma identified (0/9)  D: Lymph nodes, left pelvic, regional resection - Five lymph nodes with no metastatic carcinoma identified (0/5)  E: Cul-de-sac, anterior, biopsy - Fibroadipose tissue with no carcinoma identified  F: Cul-de-sac, posterior, biopsy - Fibrous tissue with inflammation and no carcinoma identified  G: Pelvic sidewall, right, biopsy - Fibroadipose tissue with focal crushed CD10 positive cells, cannot exclude endometrial type stroma - No carcinoma identified  H: Pelvic sidewall, left, biopsy - Fibroadipose tissue with focal crushed cells, cannot exclude endometrial type stroma - No carcinoma identified  I: Omentum, omentectomy - Adipose and fibrovascular tissue consistent with omentum - No carcinoma identified  J: Lymph nodes, left periaortic, regional resection - Two small lymph nodes with no metastatic carcinoma identified (0/2)  K: Lymph nodes, right periaortic, regional resection - Three small lymph nodes with no metastatic carcinoma identified (0/3)  This electronic signature is attestation that the pathologist personally reviewed the submitted material(s) and the final diagnosis reflects that evaluation.    Synoptic Report     OVARY or FALLOPIAN TUBE or PRIMARY PERITONEUM(Ovary FT Perit - All Specimens)    SPECIMEN  Procedure:Total hysterectomy and bilateral salpingo-oophorectomy   Procedure:Omentectomy   Procedure:Peritonealbiopsies   Procedure:Peritoneal washing   :  Specimen Integrity of Left Ovary:Received largely intact with multiple areas of disruption   Tumor Site:Left ovary   Histologic Type:Clear cell carcinoma   Histologic Grade:High grade   :  Tumor Size:Greatest dimension in Centimeters (cm): 14.0 Centimeters (cm)  Additional Dimension in Centimeters (cm):10 Centimeters (cm)  Additional Dimension in  Centimeters (cm):7 Centimeters (cm)  Tumor Extent:  Ovarian Surface Involvement:Present   Laterality:Left   Other Tissue / Organ Involvement:Not identified   Peritoneal Ascitic Fluid:Negative for malignancy (normal / benign)   Pleural Fluid:Not submitted /  unknown   Accessory Findings:  LYMPH NODES  Regional Lymph Nodes:All lymph nodes negative for tumor cells   Number of Lymph Nodes Examined:19   Site(s):Right pelvic: 9   Site(s):Left pelvic: 5   Site(s):Right para-aortic: 3   Site(s):Left para-aortic: 2   PATHOLOGIC STAGE CLASSIFICATION (pTNM, AJCC 8th Edition)  Primary Tumor (pT):pT1c2   Regional Lymph Nodes (pN):pN0   FIGO STAGE  FIGO Stage:IC2   ADDITIONAL FINDINGS  Additional Pathologic Findings:Endometriosis   Additional Pathologic Findings:Benign fallopian tube with adherence to carcinoma; also see additional parts   Comment(s)  Comment(s):Also see diagnosis comment.   The frozen section diagnosis is confirmed for specimen A. Sections demonstrate a carcinoma with tubulocystic and papillary architecture with hyalinized cores, lined by cells with high grade nuclei, clear to eosinophilic cytoplasm, and nuclear hobnailing.  The morphologic features are most suggestive of clear cell carcinoma.  Immunohistochemical stains are performed, and demonstrate that the carcinoma is positive for CK7 and PAX8, consistent with gynecologic origin. It has patchy positivity for Napsin A, wild type p53 staining, and is negative for ER, supporting the diagnosis of clear cell carcinoma.   Sections of the tumor demonstrate carcinoma with adherence to fallopian tube surface, consistent with surface involvement.  Per the operative note, findings were also suggestive of preoperative rupture of the mass.  Given both these findings, the tumor is staged as a pT1c2 (FIGO IC2)            05/27/2018  Genetic Testing   Patient has genetic testing done for genetic disorder Results revealed patient has the following mutation(s): BRCA 1 on tissue block, neg blood   06/04/2018 Procedure   She had port placement   06/05/2018 Cancer Staging   Staging form: Ovary, Fallopian Tube, and Primary Peritoneal Carcinoma, AJCC 8th Edition - Pathologic: FIGO Stage IC2, calculated as Stage IC (pT1c2, pN0, cM0) - Signed by Heath Lark, MD on 06/05/2018   06/27/2018 Imaging   1. No evidence of residual/recurrent tumor in the pelvis. 2. No evidence of metastatic disease in the chest, abdomen or pelvis. 3. Left pelvic sidewall 6.3 x 5.0 cm simple fluid collection, compatible with postsurgical seroma or lymphocele. 4. Small hiatal hernia. 5. Aortic Atherosclerosis (ICD10-I70.0).   06/27/2018 Tumor Marker   Patient's tumor was tested for the following markers: CA-125 Results of the tumor marker test revealed 90.3   07/01/2018 - 10/14/2018 Chemotherapy   The patient had palonosetron (ALOXI) injection 0.25 mg, 0.25 mg, Intravenous,  Once, 6 of 6 cycles  Administration: 0.25 mg (07/01/2018), 0.25 mg (07/22/2018), 0.25 mg (08/12/2018), 0.25 mg (09/02/2018), 0.25 mg (09/23/2018), 0.25 mg (10/14/2018)    CARBOplatin (PARAPLATIN) 420 mg in sodium chloride 0.9 % 250 mL chemo infusion, 420 mg (98.3 % of original dose 425.4 mg), Intravenous,  Once, 6 of 6 cycles  Dose modification:   (original dose 425.4 mg, Cycle 1), 380 mg (original dose 425.4 mg, Cycle 5, Reason: Dose not tolerated), 380 mg (original dose 425.4 mg, Cycle 6, Reason: Dose not tolerated)  Administration: 420 mg (07/01/2018), 440 mg (07/22/2018), 380 mg (08/12/2018), 440 mg (09/02/2018), 380 mg (09/23/2018), 380 mg (10/14/2018)    PACLitaxel (TAXOL) 270 mg in sodium chloride 0.9 % 250 mL chemo infusion (> 40m/m2), 175 mg/m2 = 270 mg, Intravenous,  Once, 6 of 6 cycles  Administration: 270 mg (07/01/2018), 270 mg (07/22/2018), 270 mg (08/12/2018), 270 mg  (09/02/2018), 270 mg (09/23/2018), 270 mg (10/14/2018)    fosaprepitant (EMEND) 150 mg, dexamethasone (DECADRON) 12 mg in sodium  chloride 0.9 % 145 mL IVPB, , Intravenous,  Once, 6 of 6 cycles  Administration:  (07/01/2018),  (07/22/2018),  (08/12/2018),  (09/02/2018),  (09/23/2018),  (10/14/2018)  for chemotherapy treatment.     07/15/2018 Imaging   Vascular US Findings consistent with acute deep vein thrombosis involving the right internal jugular veins. She is placed on Xarelto   07/21/2018 Tumor Marker   Patient's tumor was tested for the following markers: CA-125 Results of the tumor marker test revealed 22.8   09/22/2018 Tumor Marker   Patient's tumor was tested for the following markers: CA-125 Results of the tumor marker test revealed 14.5   11/10/2018 Imaging   Status post hysterectomy and bilateral salpingo-oophorectomy.  4.9 cm improving postoperative seroma along the left pelvic sidewall.  No evidence of recurrent or metastatic disease.    11/10/2018 Tumor Marker   Patient's tumor was tested for the following markers: CA-125 Results of the tumor marker test revealed 12.6   11/24/2018 Procedure   Successful removal of implanted Port-A-Cath.     Interval History: The patient reports doing well since her last visit. She denies any vaginal bleeding or discharge. Denies any abdominal or pelvic pain. Reports having a good appetite without nausea or emesis. Denies any changes to bowel or bladder function.  Mammogram in 11/2019. Done density last done in 02/2019. Colonoscopy in 2018.  Past Medical/Surgical History: Past Medical History:  Diagnosis Date  . Arthritis    in bilateral knees  . Cancer ([place])    left ovarian cancer  . Family history of colon cancer   . Family history of ovarian cancer   . GERD (gastroesophageal reflux disease)   . History of DVT (deep vein thrombosis) 2019   had a catheter-related DVT during chemotherapy  . Hyperlipidemia   . Hypertension      Past Surgical History:  Procedure Laterality Date  . ABDOMINAL HYSTERECTOMY    . BIOPSY THYROID  2008  . COLONOSCOPY    . IR CV LINE INJECTION  07/15/2018  . IR REMOVAL TUN ACCESS W/ PORT W/O FL MOD SED  11/24/2018  . TONSILECTOMY, ADENOIDECTOMY, BILATERAL MYRINGOTOMY AND TUBES Bilateral 1957    Family History  Problem Relation Age of Onset  . Hypertension Father   . Diabetes Father   . Coronary artery disease Father   . Hyperlipidemia Father   . Colon cancer Father        70's  . Hypertension Mother   . Arthritis Mother   . Cancer Maternal Aunt        type unk  . Ovarian cancer Cousin 44  . Cancer Cousin        mouth    Social History   Socioeconomic History  . Marital status: Married    Spouse name: Louie Casa  . Number of children: 3  . Years of education: Not on file  . Highest education level: Not on file  Occupational History  . Occupation: retired Solicitor  Tobacco Use  . Smoking status: Never Smoker  . Smokeless tobacco: Never Used  Substance and Sexual Activity  . Alcohol use: No  . Drug use: No  . Sexual activity: Not on file  Other Topics Concern  . Not on file  Social History Narrative   Diet:Unrestricted   Do you drink/eat things with caffeine? Rarely   Marital status: Married  What year were you married? 1992   Do you live in a house, apartment, assisted living, condo, trailer, etc)? House   Is it one or more stories? 1   How many persons live in your home? 3   Do you have any pets in your home?  2 small dogs   Current or past profession: Chief of Staff   Do you exercise?                                                     Type & how often:   Do you have a living will? No   Do you have a DNR Form? No   Do you have a POA/HPOA forms? NO   Social Determinants of Health   Financial Resource Strain:   . Difficulty of Paying Living Expenses:   Food Insecurity:   . Worried About Charity fundraiser in the  Last Year:   . Arboriculturist in the Last Year:   Transportation Needs:   . Film/video editor (Medical):   Marland Kitchen Lack of Transportation (Non-Medical):   Physical Activity:   . Days of Exercise per Week:   . Minutes of Exercise per Session:   Stress:   . Feeling of Stress :   Social Connections:   . Frequency of Communication with Friends and Family:   . Frequency of Social Gatherings with Friends and Family:   . Attends Religious Services:   . Active Member of Clubs or Organizations:   . Attends Archivist Meetings:   Marland Kitchen Marital Status:     Current Medications:  Current Outpatient Medications:  .  alendronate (FOSAMAX) 70 MG tablet, TAKE 1 TABLET EVERY WEEK, Disp: 12 tablet, Rfl: 3 .  BLACK COHOSH PO, Take 1 capsule by mouth daily., Disp: , Rfl:  .  Calcium-Magnesium-Vitamin D (CALCIUM 1200+D3 PO), , Disp: , Rfl:  .  Glucosamine HCl ([employer] GLUCOSAMINE PO), 2 tabs by mouth in the morning and 1 tab by mouth at night, Disp: , Rfl:  .  losartan-hydrochlorothiazide (HYZAAR) 50-12.5 MG tablet, Take 1 tablet by mouth daily., Disp: 90 tablet, Rfl: 1 .  Melatonin 10 MG CAPS, Take 1 tablet by mouth at bedtime. , Disp: , Rfl:  .  metoprolol tartrate (LOPRESSOR) 50 MG tablet, Take 1 tablet (50 mg total) by mouth 2 (two) times daily., Disp: 180 tablet, Rfl: 1 .  Multiple Vitamin (MULTIVITAMIN) tablet, Take 1 tablet by mouth 2 (two) times daily. , Disp: , Rfl:  .  Omega-3 Fatty Acids (OMEGA-3 FISH OIL PO), Take 1 tablet by mouth 2 (two) times daily., Disp: , Rfl:  .  omeprazole (PRILOSEC) 20 MG capsule, Take 1 capsule (20 mg total) by mouth daily., Disp: 90 capsule, Rfl: 1 .  Probiotic Product (PROBIOTIC-10 PO), , Disp: , Rfl:  .  simvastatin (ZOCOR) 20 MG tablet, Take 1 tablet (20 mg total) by mouth daily., Disp: 90 tablet, Rfl: 1 .  vitamin C (ASCORBIC ACID) 500 MG tablet, Take 500 mg by mouth daily., Disp: , Rfl:   Review of Systems: Denies appetite changes, fevers, chills,  fatigue, unexplained weight changes. Denies hearing loss, neck lumps or masses, mouth sores, ringing in ears or voice changes. Denies cough or wheezing.  Denies shortness of breath. Denies chest pain or palpitations. Denies leg swelling.  Denies abdominal distention, pain, blood in stools, constipation, diarrhea, nausea, vomiting, or early satiety. Denies pain with intercourse, dysuria, frequency, hematuria or incontinence. Denies hot flashes, pelvic pain, vaginal bleeding or vaginal discharge.   Denies joint pain, back pain or muscle pain/cramps. Denies itching, rash, or wounds. Denies dizziness, headaches, numbness or seizures. Denies swollen lymph nodes or glands, denies easy bruising or bleeding. Denies anxiety, depression, confusion, or decreased concentration.  Physical Exam: BP 101/78 (BP Location: Left Arm)   Pulse 72   Temp 98.4 F (36.9 C) (Oral)   Resp 16   Ht '4\' 10"'$  (1.473 m)   Wt 157 lb (71.2 kg)   SpO2 98%   BMI 32.81 kg/m  General: Alert, oriented, no acute distress. HEENT: Normocephalic, atraumatic, sclera anicteric. Chest: Clear to auscultation bilaterally.  No wheezes or rhonchi. Cardiovascular: Regular rate and rhythm, no murmurs. Abdomen: Obese, soft, nontender.  Normoactive bowel sounds.  No masses or hepatosplenomegaly appreciated.  Well-healed scar. Extremities: Grossly normal range of motion.  Warm, well perfused.  No edema bilaterally. Skin: No rashes or lesions noted. Lymphatics: No cervical, supraclavicular, or inguinal adenopathy. GU: Normal appearing external genitalia without erythema, excoriation, or lesions.  Speculum exam reveals cuff intact, 0.5cm hyperpigmented lesion at the right midline portion of the cuff, biopsied with patient's verbal consent. Betadine used x3 to clean the area, two punch biopsies taken and hemostasis assured with silver nitrate.  Bimanual exam reveals cuff intact, no masses or nodularity.  Rectovaginal exam confirms the  same.  Laboratory & Radiologic Studies: CA-125 in 10/2019 was 13.0  Assessment & Plan: FARHA DANO is a 69 y.o. woman with Stage IC2 status post staging surgery in 05/2018 and completion of adjuvant chemotherapy in 10/2018 who presents for surveillance.  Biopsy collected today vaginal cuff lesion, which I suspect to be benign.  We will call the patient back with this result and her Ca1 25 drawn today.  Otherwise, patient is doing well and NED on exam today.  The patient is now approximately 15 months out from completion of adjuvant therapy.  Per [facility] surveillance guidelines, I recommended that we continue every 26-monthvisits with exam and Ca1 25 until 2 years out from treatment.    Discussed signs and symptoms that are concerning for recurrent disease and should prompt a call to be seen sooner than her next scheduled visit.  22 minutes of total time was spent for this patient encounter, including preparation, face-to-face counseling with the patient and coordination of care, and documentation of the encounter.  KJeral Pinch MD  Division of Gynecologic Oncology  Department of Obstetrics and Gynecology  [facility]

## 2020-01-26 ENCOUNTER — Inpatient Hospital Stay: Payer: Medicare HMO | Attending: Gynecologic Oncology | Admitting: Gynecologic Oncology

## 2020-01-26 ENCOUNTER — Other Ambulatory Visit: Payer: Self-pay

## 2020-01-26 ENCOUNTER — Inpatient Hospital Stay: Payer: Medicare HMO

## 2020-01-26 ENCOUNTER — Encounter: Payer: Self-pay | Admitting: Gynecologic Oncology

## 2020-01-26 VITALS — BP 101/78 | HR 72 | Temp 98.4°F | Resp 16 | Ht <= 58 in | Wt 157.0 lb

## 2020-01-26 DIAGNOSIS — N898 Other specified noninflammatory disorders of vagina: Secondary | ICD-10-CM | POA: Insufficient documentation

## 2020-01-26 DIAGNOSIS — N842 Polyp of vagina: Secondary | ICD-10-CM | POA: Diagnosis not present

## 2020-01-26 DIAGNOSIS — C562 Malignant neoplasm of left ovary: Secondary | ICD-10-CM

## 2020-01-26 DIAGNOSIS — Z8543 Personal history of malignant neoplasm of ovary: Secondary | ICD-10-CM | POA: Insufficient documentation

## 2020-01-26 DIAGNOSIS — Z90710 Acquired absence of both cervix and uterus: Secondary | ICD-10-CM | POA: Diagnosis not present

## 2020-01-26 DIAGNOSIS — Z08 Encounter for follow-up examination after completed treatment for malignant neoplasm: Secondary | ICD-10-CM | POA: Insufficient documentation

## 2020-01-26 DIAGNOSIS — Z90722 Acquired absence of ovaries, bilateral: Secondary | ICD-10-CM | POA: Diagnosis not present

## 2020-01-26 NOTE — Addendum Note (Signed)
Addended by: Joylene John D on: 01/26/2020 03:45 PM   Modules accepted: Orders

## 2020-01-26 NOTE — Patient Instructions (Signed)
Overall things looked good today.  I will call you with the biopsy results, which I suspect will be normal.  I will also call you or release the CA-125 in my chart.  I will see you in 3 months for a follow-up visit.  If you have any new symptoms or concerns before then, please call at [phone].

## 2020-01-27 ENCOUNTER — Encounter: Payer: Self-pay | Admitting: Gynecologic Oncology

## 2020-01-27 LAB — SURGICAL PATHOLOGY

## 2020-01-27 LAB — CA 125: Cancer Antigen (CA) 125: 13.8 U/mL (ref 0.0–38.1)

## 2020-02-17 ENCOUNTER — Other Ambulatory Visit: Payer: Self-pay | Admitting: Internal Medicine

## 2020-02-17 DIAGNOSIS — E781 Pure hyperglyceridemia: Secondary | ICD-10-CM

## 2020-03-17 ENCOUNTER — Other Ambulatory Visit: Payer: Medicare HMO

## 2020-03-17 ENCOUNTER — Other Ambulatory Visit: Payer: Self-pay

## 2020-03-17 DIAGNOSIS — R739 Hyperglycemia, unspecified: Secondary | ICD-10-CM | POA: Diagnosis not present

## 2020-03-17 DIAGNOSIS — Z8543 Personal history of malignant neoplasm of ovary: Secondary | ICD-10-CM | POA: Diagnosis not present

## 2020-03-17 DIAGNOSIS — Z0000 Encounter for general adult medical examination without abnormal findings: Secondary | ICD-10-CM | POA: Diagnosis not present

## 2020-03-17 DIAGNOSIS — E785 Hyperlipidemia, unspecified: Secondary | ICD-10-CM | POA: Diagnosis not present

## 2020-03-17 DIAGNOSIS — E781 Pure hyperglyceridemia: Secondary | ICD-10-CM | POA: Diagnosis not present

## 2020-03-17 DIAGNOSIS — I10 Essential (primary) hypertension: Secondary | ICD-10-CM | POA: Diagnosis not present

## 2020-03-18 LAB — CBC WITH DIFFERENTIAL/PLATELET
Absolute Monocytes: 530 cells/uL (ref 200–950)
Basophils Absolute: 48 cells/uL (ref 0–200)
Basophils Relative: 0.9 %
Eosinophils Absolute: 180 cells/uL (ref 15–500)
Eosinophils Relative: 3.4 %
HCT: 42.2 % (ref 35.0–45.0)
Hemoglobin: 14.5 g/dL (ref 11.7–15.5)
Lymphs Abs: 1261 cells/uL (ref 850–3900)
MCH: 34.4 pg — ABNORMAL HIGH (ref 27.0–33.0)
MCHC: 34.4 g/dL (ref 32.0–36.0)
MCV: 100 fL (ref 80.0–100.0)
MPV: 9.2 fL (ref 7.5–12.5)
Monocytes Relative: 10 %
Neutro Abs: 3281 cells/uL (ref 1500–7800)
Neutrophils Relative %: 61.9 %
Platelets: 272 10*3/uL (ref 140–400)
RBC: 4.22 10*6/uL (ref 3.80–5.10)
RDW: 12.1 % (ref 11.0–15.0)
Total Lymphocyte: 23.8 %
WBC: 5.3 10*3/uL (ref 3.8–10.8)

## 2020-03-18 LAB — HEMOGLOBIN A1C
Hgb A1c MFr Bld: 5.9 % of total Hgb — ABNORMAL HIGH (ref <5.7)
Mean Plasma Glucose: 123 (calc)
eAG (mmol/L): 6.8 (calc)

## 2020-03-18 LAB — LIPID PANEL
Cholesterol: 144 mg/dL (ref <200)
HDL: 43 mg/dL — ABNORMAL LOW (ref >=50)
LDL Cholesterol (Calc): 67 mg/dL (calc)
Non-HDL Cholesterol (Calc): 101 mg/dL (calc) (ref <130)
Total CHOL/HDL Ratio: 3.3 (calc) (ref <5.0)
Triglycerides: 247 mg/dL — ABNORMAL HIGH (ref <150)

## 2020-03-18 LAB — COMPLETE METABOLIC PANEL WITH GFR
AG Ratio: 1.7 (calc) (ref 1.0–2.5)
ALT: 24 U/L (ref 6–29)
AST: 22 U/L (ref 10–35)
Albumin: 4.4 g/dL (ref 3.6–5.1)
Alkaline phosphatase (APISO): 29 U/L — ABNORMAL LOW (ref 37–153)
BUN/Creatinine Ratio: 20 (calc) (ref 6–22)
BUN: 20 mg/dL (ref 7–25)
CO2: 27 mmol/L (ref 20–32)
Calcium: 9.7 mg/dL (ref 8.6–10.4)
Chloride: 107 mmol/L (ref 98–110)
Creat: 1.01 mg/dL — ABNORMAL HIGH (ref 0.60–0.93)
GFR, Est African American: 65 mL/min/{1.73_m2} (ref >=60)
GFR, Est Non African American: 56 mL/min/{1.73_m2} — ABNORMAL LOW (ref >=60)
Globulin: 2.6 g/dL (calc) (ref 1.9–3.7)
Glucose, Bld: 114 mg/dL — ABNORMAL HIGH (ref 65–99)
Potassium: 3.9 mmol/L (ref 3.5–5.3)
Sodium: 140 mmol/L (ref 135–146)
Total Bilirubin: 0.9 mg/dL (ref 0.2–1.2)
Total Protein: 7 g/dL (ref 6.1–8.1)

## 2020-03-18 NOTE — Progress Notes (Signed)
[name]'s labs look good overall with normal basic blood counts, stable kidneys (slightly better actually), normal electrolytes, liver (low is ok), stable sugar average, and bad cholesterol at goal.  Triglycerides went up just a smidge vs last time.

## 2020-03-21 ENCOUNTER — Encounter: Payer: Self-pay | Admitting: Internal Medicine

## 2020-03-21 ENCOUNTER — Other Ambulatory Visit: Payer: Self-pay

## 2020-03-21 ENCOUNTER — Ambulatory Visit (INDEPENDENT_AMBULATORY_CARE_PROVIDER_SITE_OTHER): Payer: Medicare HMO | Admitting: Internal Medicine

## 2020-03-21 VITALS — BP 118/82 | HR 52 | Temp 97.5°F | Ht <= 58 in | Wt 160.2 lb

## 2020-03-21 DIAGNOSIS — Z8543 Personal history of malignant neoplasm of ovary: Secondary | ICD-10-CM | POA: Insufficient documentation

## 2020-03-21 DIAGNOSIS — K219 Gastro-esophageal reflux disease without esophagitis: Secondary | ICD-10-CM | POA: Diagnosis not present

## 2020-03-21 DIAGNOSIS — I10 Essential (primary) hypertension: Secondary | ICD-10-CM | POA: Diagnosis not present

## 2020-03-21 DIAGNOSIS — E781 Pure hyperglyceridemia: Secondary | ICD-10-CM | POA: Diagnosis not present

## 2020-03-21 DIAGNOSIS — R739 Hyperglycemia, unspecified: Secondary | ICD-10-CM | POA: Diagnosis not present

## 2020-03-21 DIAGNOSIS — J309 Allergic rhinitis, unspecified: Secondary | ICD-10-CM | POA: Insufficient documentation

## 2020-03-21 MED ORDER — METOPROLOL TARTRATE 50 MG PO TABS: 50.0000 mg | ORAL_TABLET | Freq: Two times a day (BID) | ORAL | 1 refills | Status: DC

## 2020-03-21 MED ORDER — SIMVASTATIN 20 MG PO TABS: 20.0000 mg | ORAL_TABLET | Freq: Every day | ORAL | 1 refills | Status: DC

## 2020-03-21 MED ORDER — LOSARTAN POTASSIUM-HCTZ 50-12.5 MG PO TABS: 1.0000 | ORAL_TABLET | Freq: Every day | ORAL | 1 refills | Status: DC

## 2020-03-21 MED ORDER — ALENDRONATE SODIUM 70 MG PO TABS: ORAL_TABLET | ORAL | 3 refills | Status: DC

## 2020-03-21 MED ORDER — OMEPRAZOLE 20 MG PO CPDR: 20.0000 mg | DELAYED_RELEASE_CAPSULE | Freq: Every day | ORAL | 1 refills | Status: DC

## 2020-03-21 NOTE — Progress Notes (Signed)
Location:  [facility] clinic Provider:  Jayci Ellefson L. Mariea Clonts, D.O., C.M.D.  Goals of Care:  Advanced Directives 03/21/2020  Does Patient Have a Medical Advance Directive? Yes  Type of Advance Directive [place]  Does patient want to make changes to medical advance directive? No - Patient declined  Copy of [place] in Chart? -  Would patient like information on creating a medical advance directive? -     Chief Complaint  Patient presents with  . Medical Management of Chronic Issues    6 month follow up     HPI: Patient is a 69 y.o. female seen today for medical management of chronic diseases.    Triglycerides were a little high.  Sugar average stable and all other labs ok.  No recent regular exercise.    She had her gyn f/u 01/26/20.  All turned out well--had biopsy of vaginal cuff that was negative.  Is for 3 mos visits and ca 125 for 2 years.  Head is stopped up all of the time.  Can't breath lying down.  Has to sit up.  Is congested and has drainage.  Eyes water a lot in the morning and nose runs.  It's all of the time.  Wonders about her dogs' pet dander--not changing that.  Has done loratadine during high pollen seasonally.  Often wakes with headache, but that goes away as everything is drained.    Knees continue to ache.  Hips also do.  Legs not as strong sine chemo.  Can't do much walking w/o hurting.  Says tylenol helps a little.  If she sits a long time, then gets up.  Does have left bone on bone and right is getting there.  She's already had cortisone shot.      Past Medical History:  Diagnosis Date  . Arthritis    in bilateral knees  . Cancer ([place])    left ovarian cancer  . Family history of colon cancer   . Family history of ovarian cancer   . GERD (gastroesophageal reflux disease)   . History of DVT (deep vein thrombosis) 2019   had a catheter-related DVT during chemotherapy  . Hyperlipidemia   . Hypertension     Past Surgical  History:  Procedure Laterality Date  . ABDOMINAL HYSTERECTOMY    . BIOPSY THYROID  2008  . COLONOSCOPY    . IR CV LINE INJECTION  07/15/2018  . IR REMOVAL TUN ACCESS W/ PORT W/O FL MOD SED  11/24/2018  . TONSILECTOMY, ADENOIDECTOMY, BILATERAL MYRINGOTOMY AND TUBES Bilateral 1957    Allergies  Allergen Reactions  . Pollen Extract     Outpatient Encounter Medications as of 03/21/2020  Medication Sig  . alendronate (FOSAMAX) 70 MG tablet Take with a full glass of water on an empty stomach.  Marland Kitchen BLACK COHOSH PO Take 1 capsule by mouth daily.  . Calcium-Magnesium-Vitamin D (CALCIUM 1200+D3 PO)   . Glucosamine HCl ([employer] GLUCOSAMINE PO) 2 tabs by mouth in the morning and 1 tab by mouth at night  . losartan-hydrochlorothiazide (HYZAAR) 50-12.5 MG tablet Take 1 tablet by mouth daily.  . Melatonin 10 MG CAPS Take 1 tablet by mouth at bedtime.   . metoprolol tartrate (LOPRESSOR) 50 MG tablet Take 1 tablet (50 mg total) by mouth 2 (two) times daily.  . Multiple Vitamin (MULTIVITAMIN) tablet Take 1 tablet by mouth 2 (two) times daily.   . Omega-3 Fatty Acids (OMEGA-3 FISH OIL PO) Take 1 tablet by  mouth 2 (two) times daily.  Marland Kitchen omeprazole (PRILOSEC) 20 MG capsule Take 1 capsule (20 mg total) by mouth daily.  . Probiotic Product (PROBIOTIC-10 PO)   . simvastatin (ZOCOR) 20 MG tablet Take 1 tablet (20 mg total) by mouth daily.  . vitamin C (ASCORBIC ACID) 500 MG tablet Take 500 mg by mouth daily.  . [DISCONTINUED] alendronate (FOSAMAX) 70 MG tablet TAKE 1 TABLET EVERY WEEK  . [DISCONTINUED] losartan-hydrochlorothiazide (HYZAAR) 50-12.5 MG tablet Take 1 tablet by mouth daily.  . [DISCONTINUED] metoprolol tartrate (LOPRESSOR) 50 MG tablet Take 1 tablet (50 mg total) by mouth 2 (two) times daily.  . [DISCONTINUED] omeprazole (PRILOSEC) 20 MG capsule Take 1 capsule (20 mg total) by mouth daily.  . [DISCONTINUED] simvastatin (ZOCOR) 20 MG tablet TAKE 1 TABLET (20 MG TOTAL) BY MOUTH DAILY.   No  facility-administered encounter medications on file as of 03/21/2020.    Review of Systems:  Review of Systems  Constitutional: Negative for chills, fever and malaise/fatigue.  HENT: Positive for congestion. Negative for sore throat.   Eyes: Negative for blurred vision.  Respiratory: Negative for cough and shortness of breath.   Cardiovascular: Negative for chest pain, palpitations and leg swelling.  Gastrointestinal: Negative for abdominal pain and constipation.       Occasional diarrhea--takes imodium  Genitourinary: Negative for dysuria.  Musculoskeletal: Positive for joint pain. Negative for falls.       Hips and knees  Neurological: Negative for dizziness and loss of consciousness.  Psychiatric/Behavioral: Negative for depression and memory loss. The patient is not nervous/anxious and does not have insomnia.     Health Maintenance  Topic Date Due  . COVID-19 Vaccine (1) 04/06/2020 (Originally 09/17/1962)  . INFLUENZA VACCINE  04/10/2020  . MAMMOGRAM  11/18/2021  . TETANUS/TDAP  06/23/2022  . COLONOSCOPY  11/06/2026  . DEXA SCAN  Completed  . Hepatitis C Screening  Completed  . PNA vac Low Risk Adult  Completed    Physical Exam: Vitals:   03/21/20 1036  BP: 118/82  Pulse: (!) 52  Temp: (!) 97.5 F (36.4 C)  TempSrc: Temporal  SpO2: 97%  Weight: 160 lb 3.2 oz (72.7 kg)  Height: 4\' 10"  (1.473 m)   Body mass index is 33.48 kg/m. Physical Exam Cardiovascular:     Rate and Rhythm: Normal rate and regular rhythm.     Pulses: Normal pulses.     Heart sounds: Normal heart sounds.  Pulmonary:     Effort: Pulmonary effort is normal.     Breath sounds: Normal breath sounds. No wheezing, rhonchi or rales.  Abdominal:     General: Bowel sounds are normal.  Musculoskeletal:        General: Tenderness present. Normal range of motion.     Right lower leg: No edema.     Left lower leg: No edema.  Skin:    General: Skin is warm and dry.  Neurological:     General: No  focal deficit present.     Mental Status: She is alert and oriented to person, place, and time.  Psychiatric:        Mood and Affect: Mood normal.        Behavior: Behavior normal.        Thought Content: Thought content normal.        Judgment: Judgment normal.     Labs reviewed: Basic Metabolic Panel: Recent Labs    08/25/19 0933 03/17/20 0936  NA 140 140  K 3.9 3.9  CL 104 107  CO2 27 27  GLUCOSE 101* 114*  BUN 22 20  CREATININE 1.04* 1.01*  CALCIUM 9.2 9.7   Liver Function Tests: Recent Labs    08/25/19 0933 03/17/20 0936  AST 22 22  ALT 22 24  BILITOT 0.8 0.9  PROT 7.0 7.0   No results for input(s): LIPASE, AMYLASE in the last 8760 hours. No results for input(s): AMMONIA in the last 8760 hours. CBC: Recent Labs    08/25/19 0933 03/17/20 0936  WBC 5.2 5.3  NEUTROABS 3,078 3,281  HGB 14.3 14.5  HCT 42.0 42.2  MCV 99.5 100.0  PLT 284 272   Lipid Panel: Recent Labs    08/25/19 0933 03/17/20 0936  CHOL 162 144  HDL 50 43*  LDLCALC 79 67  TRIG 239* 247*  CHOLHDL 3.2 3.3   Lab Results  Component Value Date   HGBA1C 5.9 (H) 03/17/2020    Procedures since last visit: No results found.  Assessment/Plan 1. Essential hypertension, benign -bp at goal, cont same regimen, encouraged exercise - losartan-hydrochlorothiazide (HYZAAR) 50-12.5 MG tablet; Take 1 tablet by mouth daily.  Dispense: 90 tablet; Refill: 1 - metoprolol tartrate (LOPRESSOR) 50 MG tablet; Take 1 tablet (50 mg total) by mouth 2 (two) times daily.  Dispense: 180 tablet; Refill: 1  2. Pure hypertriglyceridemia - LDL at goal, but TG remain high, she is aware and this is a chronic issue, encouraged exercise - simvastatin (ZOCOR) 20 MG tablet; Take 1 tablet (20 mg total) by mouth daily.  Dispense: 90 tablet; Refill: 1  3. Gastroesophageal reflux disease - stable on omeprazole, cont same regimen and monitor - omeprazole (PRILOSEC) 20 MG capsule; Take 1 capsule (20 mg total) by mouth  daily.  Dispense: 90 capsule; Refill: 1  4. History of ovarian cancer -continue regular gyn visits and labs q 3 mos for 2 years as per their recommendations  5. Hyperglycemia -f/u lab before CPE Lab Results  Component Value Date   HGBA1C 5.9 (H) 03/17/2020  - Hemoglobin A1c; Future  6. Allergic sinusitis -recommended use of loratadine and flonase (says her husband also has this congestion which suggests [place] me [place] reacting to)  Labs/tests ordered:  Lab Orders     Lipid panel     Hemoglobin A1c     COMPLETE METABOLIC PANEL WITH GFR  Next appt:  6 mos for CPE, fasting labs before   Synethia Endicott L. Peyten Punches, D.O. [facility] [address], [place] [address] Cell Phone (Mon-Fri 8am-5pm):  [phone] On Call:  [phone] & follow prompts after 5pm & weekends Office Phone:  [phone] Office Fax:  [phone]

## 2020-03-21 NOTE — Patient Instructions (Signed)
Continue with voltaren gel, tylenol for knees, try to keep moving.

## 2020-03-22 ENCOUNTER — Telehealth: Payer: Self-pay

## 2020-03-22 MED ORDER — ALENDRONATE SODIUM 70 MG PO TABS: ORAL_TABLET | ORAL | 3 refills | Status: DC

## 2020-03-22 NOTE — Telephone Encounter (Signed)
Prescription clarification request received form [facility] for Alendronate 70 mg tablet. Dosage was missing from prescription. Prescription sent to pharmacy with dosage 1 weekly.

## 2020-04-11 ENCOUNTER — Encounter: Payer: Self-pay | Admitting: Internal Medicine

## 2020-04-19 NOTE — Progress Notes (Signed)
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 Large pelvic mass with heterogeneous appearance, measuring at least 15 centimeters in diameter. Although the findings favor enlarged uterus/fibroids, ovaries are not well seen and an ovarian mass should also be considered. Further characterization with pelvic ultrasound is recommended. 2. The bladder is completely decompressed. There is LEFT-sided hydronephrosis secondary to extrinsic compression from the pelvic mass. 3. Irregular thickening of the sigmoid colon, suspicious for colonic wall mass, measuring 1.3 centimeters. In this patient with a family history of colon cancer, further evaluation with colonoscopy is recommended. 4. Colonic diverticula without acute diverticulitis. 5. Moderate hiatal hernia.    05/15/2018 Imaging   MRI pelvis 16 cm complex cystic and solid mass which is centered in the left posterior adnexa and is contiguous with the uterus. This is suspicious for ovarian carcinoma, with differential diagnosis of atypical pedunculated fibroid with peripheral cystic degeneration. Surgical evaluation is recommended.  Other uterine fibroids measuring up to 4.8 cm.  No evidence of pelvic metastatic disease.    05/16/2018 Imaging   US pelvis 1. Large pelvic mass described in detail on MRI examination 1 day prior. 2. Slight ureteral prominence bilaterally. Mild fullness of the left renal collecting system. No obstructing focus evident. Suspect fullness of these structures due to ureteral  compression by the sizable pelvic mass.     05/22/2018 Tumor Marker   Patient's tumor was tested for the following markers: CA-125 Results of the tumor marker test revealed 122   05/27/2018 Surgery   Preoperative Diagnoses: Pelvic mass  Postoperative Diagnoses: left fallopian tube and ovary consistent with high-grade carcinoma of likely GYN origin. No evidence of metastatic disease.  Procedures: Exploratory laparotomy, total abdominal hysterectomy, bilateral salpingo-oophorectomy, peritoneal biopsies for ovarian cancer staging, bilateral pelvic lymphadenectomy, bilateral para-aortic lymphadenectomy, infracolic omentectomy, R0 resection   Surgeon: Marti Sleigh, MD  Assistants: Tona Sensing, MD - Fellow, Haroldine Laws, MD - Resident, Feliberto Harts, PA-S  Findings: On BME, the cervix is retracted anteriorly with no palpable adnexal fullness. There is no RV septum nodularity. Abdominal exam reveals a mobile large mass that extends to the umbilicus and nearly to the pelvic side walls. On abdominal entry, there was immediate green-brown pelvic fluid that appeared consistent with a spontaneous rupture of the cystic fluid-filled mass. The left ovary was a large cystic complex mass, roughly 15cm in size with smooth surface but containing friable tumor. The mass was densely adherent along the entire posterior wall of the uterus. The uterus had some small intramural fibroids and was roughly 10cm in total size. The right fallopian tube and ovary were normal appearing. The patient has smooth paracolic gutters, liver edge, diaphragm, spleen and stomach. The omentum was retracted but no evidence of nodularity or gross tumor. The anterior cul-de-sac was clear and the posterior-cul-de-sac peritoneum was clear, but the left ovarian mass wall was adherent to the rectosigmoid and cul-de-sac. There were no palpable pelvic or para-aortic adenopathy.   Of note, an the conclusion of the case, there was mild  RIGHT hydronephrosis noted. The ureter was traced  from the level of the right common iliac into the anterior utero-vesical ligament. There was an area at the pelvic brim were the hydronephrosis resolved to normal caliber. There was no tethering, stricture, or apparent injury to the ureter. Based on exam, this seemed consistent with packing and retraction causing some transient dilation that resolved with packing and retractor removal.   Intraoperative frozen section of the left ovary was consistent with high-grade carcinoma of GYN origin, possibly serous. There was no gross evidence of disease at the conclusion of the case, R0 resection.  Specimens: 1) Pelvic washings 2) Uterus with cervix  3) Left ovary and fallopian tube, IOFS c/w high-grade GYN carcinoma 4) Right fallopian tube and ovary 5) Peritoneal biopsies - anterior cul-de-sac peritoneum, posterior cul-de-sac peritoneum, left pelvic side wall peritoneum, right pelvic side wall peritoneum 6) Right and left pelvic lymph nodes 7) Right and left para-aortic lymph nodes 8) Omentum 9) Anaerobic/Aerobic culture - preliminary read is no organisms    05/27/2018 Pathology Results   A: Ovary and fallopian tube, left, salpingo-oophorectomy - Clear cell carcinoma of left ovary, size 14.0 cm, with necrosis  - Carcinoma is adherent to fallopian tube, consistent with surface involvement (stage pT1c2) - Focal endometriosis present - See synoptic report and comment  B: Uterus with cervix and right ovary and fallopian tube, hysterectomy and right salpingo-oophorectomy Cervix: Benign with Nabothian cysts Endometrium: Endometrial polyp (size 3.5 cm); inactive endometrium with cystic glandular changes Myometrium: Leiomyomata with hyalinization and calcification (size up to 4.2 cm); focal adenomyosis  Right ovary: Benign physiologic changes Right fallopian tube: Small paratubal cyst and no malignancy identified  C: Lymph nodes, right pelvic, regional  resection - Nine lymph nodes with no metastatic carcinoma identified (0/9)  D: Lymph nodes, left pelvic, regional resection - Five lymph nodes with no metastatic carcinoma identified (0/5)  E: Cul-de-sac, anterior, biopsy - Fibroadipose tissue with no carcinoma identified  F: Cul-de-sac, posterior, biopsy - Fibrous tissue with inflammation and no carcinoma identified  G: Pelvic sidewall, right, biopsy - Fibroadipose tissue with focal crushed CD10 positive cells, cannot exclude endometrial type stroma - No carcinoma identified  H: Pelvic sidewall, left, biopsy - Fibroadipose tissue with focal crushed cells, cannot exclude endometrial type stroma - No carcinoma identified  I: Omentum, omentectomy - Adipose and fibrovascular tissue consistent with omentum - No carcinoma identified  J: Lymph nodes, left periaortic, regional resection - Two small lymph nodes with no metastatic carcinoma identified (0/2)  K: Lymph nodes, right periaortic, regional resection - Three small lymph nodes with no metastatic carcinoma identified (0/3)  This electronic signature is attestation that the pathologist personally reviewed the submitted material(s) and the final diagnosis reflects that evaluation.    Synoptic Report     OVARY or FALLOPIAN TUBE or PRIMARY PERITONEUM(Ovary FT Perit - All Specimens)    SPECIMEN  Procedure:Total hysterectomy and bilateral salpingo-oophorectomy   Procedure:Omentectomy   Procedure:Peritonealbiopsies   Procedure:Peritoneal washing   :  Specimen Integrity of Left Ovary:Received largely intact with multiple areas of disruption   Tumor Site:Left ovary   Histologic Type:Clear cell carcinoma   Histologic Grade:High grade   :  Tumor Size:Greatest dimension in Centimeters (cm): 14.0 Centimeters (cm)  Additional Dimension in Centimeters (cm):10 Centimeters (cm)  Additional Dimension in  Centimeters (cm):7 Centimeters (cm)  Tumor Extent:  Ovarian Surface Involvement:Present   Laterality:Left   Other Tissue / Organ Involvement:Not identified   Peritoneal Ascitic Fluid:Negative for malignancy (normal / benign)   Pleural Fluid:Not submitted / unknown  Accessory Findings:  LYMPH NODES  Regional Lymph Nodes:All lymph nodes negative for tumor cells   Number of Lymph Nodes Examined:19   Site(s):Right pelvic: 9   Site(s):Left pelvic: 5   Site(s):Right para-aortic: 3   Site(s):Left para-aortic: 2   PATHOLOGIC STAGE CLASSIFICATION (pTNM, AJCC 8th Edition)  Primary Tumor (pT):pT1c2   Regional Lymph Nodes (pN):pN0   FIGO STAGE  FIGO Stage:IC2   ADDITIONAL FINDINGS  Additional Pathologic Findings:Endometriosis   Additional Pathologic Findings:Benign fallopian tube with adherence to carcinoma; also see additional parts   Comment(s)  Comment(s):Also see diagnosis comment.   The frozen section diagnosis is confirmed for specimen A. Sections demonstrate a carcinoma with tubulocystic and papillary architecture with hyalinized cores, lined by cells with high grade nuclei, clear to eosinophilic cytoplasm, and nuclear hobnailing.  The morphologic features are most suggestive of clear cell carcinoma.  Immunohistochemical stains are performed, and demonstrate that the carcinoma is positive for CK7 and PAX8, consistent with gynecologic origin. It has patchy positivity for Napsin A, wild type p53 staining, and is negative for ER, supporting the diagnosis of clear cell carcinoma.   Sections of the tumor demonstrate carcinoma with adherence to fallopian tube surface, consistent with surface involvement.  Per the operative note, findings were also suggestive of preoperative rupture of the mass.  Given both these findings, the tumor is staged as a pT1c2 (FIGO IC2)            05/27/2018  Genetic Testing   Patient has genetic testing done for genetic disorder Results revealed patient has the following mutation(s): BRCA 1 on tissue block, neg blood   06/04/2018 Procedure   She had port placement   06/05/2018 Cancer Staging   Staging form: Ovary, Fallopian Tube, and Primary Peritoneal Carcinoma, AJCC 8th Edition - Pathologic: FIGO Stage IC2, calculated as Stage IC (pT1c2, pN0, cM0) - Signed by Heath Lark, MD on 06/05/2018   06/27/2018 Imaging   1. No evidence of residual/recurrent tumor in the pelvis. 2. No evidence of metastatic disease in the chest, abdomen or pelvis. 3. Left pelvic sidewall 6.3 x 5.0 cm simple fluid collection, compatible with postsurgical seroma or lymphocele. 4. Small hiatal hernia. 5. Aortic Atherosclerosis (ICD10-I70.0).   06/27/2018 Tumor Marker   Patient's tumor was tested for the following markers: CA-125 Results of the tumor marker test revealed 90.3   07/01/2018 - 10/14/2018 Chemotherapy   The patient had palonosetron (ALOXI) injection 0.25 mg, 0.25 mg, Intravenous,  Once, 6 of 6 cycles  Administration: 0.25 mg (07/01/2018), 0.25 mg (07/22/2018), 0.25 mg (08/12/2018), 0.25 mg (09/02/2018), 0.25 mg (09/23/2018), 0.25 mg (10/14/2018)    CARBOplatin (PARAPLATIN) 420 mg in sodium chloride 0.9 % 250 mL chemo infusion, 420 mg (98.3 % of original dose 425.4 mg), Intravenous,  Once, 6 of 6 cycles  Dose modification:   (original dose 425.4 mg, Cycle 1), 380 mg (original dose 425.4 mg, Cycle 5, Reason: Dose not tolerated), 380 mg (original dose 425.4 mg, Cycle 6, Reason: Dose not tolerated)  Administration: 420 mg (07/01/2018), 440 mg (07/22/2018), 380 mg (08/12/2018), 440 mg (09/02/2018), 380 mg (09/23/2018), 380 mg (10/14/2018)    PACLitaxel (TAXOL) 270 mg in sodium chloride 0.9 % 250 mL chemo infusion (> 60m/m2), 175 mg/m2 = 270 mg, Intravenous,  Once, 6 of 6 cycles  Administration: 270 mg (07/01/2018), 270 mg (07/22/2018), 270 mg (08/12/2018), 270 mg  (09/02/2018), 270 mg (09/23/2018), 270 mg (10/14/2018)    fosaprepitant (EMEND) 150 mg, dexamethasone (DECADRON) 12 mg in sodium chloride 0.9 %  145 mL IVPB, , Intravenous,  Once, 6 of 6 cycles  Administration:  (07/01/2018),  (07/22/2018),  (08/12/2018),  (09/02/2018),  (09/23/2018),  (10/14/2018)  for chemotherapy treatment.     07/15/2018 Imaging   Vascular US Findings consistent with acute deep vein thrombosis involving the right internal jugular veins. She is placed on Xarelto   07/21/2018 Tumor Marker   Patient's tumor was tested for the following markers: CA-125 Results of the tumor marker test revealed 22.8   09/22/2018 Tumor Marker   Patient's tumor was tested for the following markers: CA-125 Results of the tumor marker test revealed 14.5   11/10/2018 Imaging   Status post hysterectomy and bilateral salpingo-oophorectomy.  4.9 cm improving postoperative seroma along the left pelvic sidewall.  No evidence of recurrent or metastatic disease.    11/10/2018 Tumor Marker   Patient's tumor was tested for the following markers: CA-125 Results of the tumor marker test revealed 12.6   11/24/2018 Procedure   Successful removal of implanted Port-A-Cath.     Interval History: Patient reports overall doing well since her last visit.  She endorses a good appetite without any nausea or emesis.  She reports normal bowel and bladder function.  She denies any abdominal pain, pelvic pain, vaginal bleeding, or discharge.  [place] [facility] [place] years [place].  They spent several weeks there in July and are hoping to go back this weekend.  Past Medical/Surgical History: Past Medical History:  Diagnosis Date  . AKI (acute kidney injury) ([place]) 05/23/2018  . Arthritis    in bilateral knees  . Cancer ([place])    left ovarian cancer  . Family history of colon cancer   . Family history of ovarian cancer   . GERD (gastroesophageal reflux  disease)   . History of DVT (deep vein thrombosis) 2019   had a catheter-related DVT during chemotherapy  . Hyperlipidemia   . Hypertension   . 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 ABDOMINAL HYSTERECTOMY    . BIOPSY THYROID  2008  . COLONOSCOPY    . IR CV LINE INJECTION  07/15/2018  . IR REMOVAL TUN ACCESS W/ PORT W/O FL MOD SED  11/24/2018  . TONSILECTOMY, ADENOIDECTOMY, BILATERAL MYRINGOTOMY AND TUBES Bilateral 1957    Family History  Problem Relation Age of Onset  . Hypertension Father   . Diabetes Father   . Coronary artery disease Father   . Hyperlipidemia Father   . Colon cancer Father        10's  . Hypertension Mother   . Arthritis Mother   . Cancer Maternal Aunt        type unk  . Ovarian cancer Cousin 66  . Cancer Cousin        mouth    Social History   Socioeconomic History  . Marital status: Married    Spouse name: Louie Casa  . Number of children: 3  . Years of education: Not on file  . Highest education level: Not on file  Occupational History  . Occupation: retired Solicitor  Tobacco Use  . Smoking status: Never Smoker  . Smokeless tobacco: Never Used  Vaping Use  . Vaping Use: Never used  Substance and Sexual Activity  . Alcohol use: No  . Drug use: No  . Sexual activity: Not on file  Other Topics Concern  . Not on file  Social History  Narrative   Diet:Unrestricted   Do you drink/eat things with caffeine? Rarely   Marital status: Married                             What year were you married? 1992   Do you live in a house, apartment, assisted living, condo, trailer, etc)? House   Is it one or more stories? 1   How many persons live in your home? 3   Do you have any pets in your home?  2 small dogs   Current or past profession: Chief of Staff   Do you exercise?                                                     Type & how often:   Do you have a living will? No   Do you have a DNR Form?  No   Do you have a POA/HPOA forms? NO   Social Determinants of Health   Financial Resource Strain:   . Difficulty of Paying Living Expenses:   Food Insecurity:   . Worried About Charity fundraiser in the Last Year:   . Arboriculturist in the Last Year:   Transportation Needs:   . Film/video editor (Medical):   Marland Kitchen Lack of Transportation (Non-Medical):   Physical Activity:   . Days of Exercise per Week:   . Minutes of Exercise per Session:   Stress:   . Feeling of Stress :   Social Connections:   . Frequency of Communication with Friends and Family:   . Frequency of Social Gatherings with Friends and Family:   . Attends Religious Services:   . Active Member of Clubs or Organizations:   . Attends Archivist Meetings:   Marland Kitchen Marital Status:     Current Medications:  Current Outpatient Medications:  .  alendronate (FOSAMAX) 70 MG tablet, Take one every week with a full glass of water on an empty stomach, Disp: 12 tablet, Rfl: 3 .  BLACK COHOSH PO, Take 1 capsule by mouth daily., Disp: , Rfl:  .  Calcium-Magnesium-Vitamin D (CALCIUM 1200+D3 PO), , Disp: , Rfl:  .  Glucosamine HCl ([employer] GLUCOSAMINE PO), 2 tabs by mouth in the morning and 1 tab by mouth at night, Disp: , Rfl:  .  losartan-hydrochlorothiazide (HYZAAR) 50-12.5 MG tablet, Take 1 tablet by mouth daily., Disp: 90 tablet, Rfl: 1 .  Melatonin 10 MG CAPS, Take 1 tablet by mouth at bedtime. , Disp: , Rfl:  .  metoprolol tartrate (LOPRESSOR) 50 MG tablet, Take 1 tablet (50 mg total) by mouth 2 (two) times daily., Disp: 180 tablet, Rfl: 1 .  Multiple Vitamin (MULTIVITAMIN) tablet, Take 1 tablet by mouth 2 (two) times daily. , Disp: , Rfl:  .  Omega-3 Fatty Acids (OMEGA-3 FISH OIL PO), Take 1 tablet by mouth 2 (two) times daily., Disp: , Rfl:  .  omeprazole (PRILOSEC) 20 MG capsule, Take 1 capsule (20 mg total) by mouth daily., Disp: 90 capsule, Rfl: 1 .  Probiotic Product (PROBIOTIC-10 PO), , Disp: , Rfl:  .   simvastatin (ZOCOR) 20 MG tablet, Take 1 tablet (20 mg total) by mouth daily., Disp: 90 tablet, Rfl: 1 .  vitamin C (ASCORBIC ACID) 500 MG tablet,  Take 500 mg by mouth daily., Disp: , Rfl:   Review of Systems: Pertinent positives as per HPI.  Patient also endorses joint pain Denies appetite changes, fevers, chills, fatigue, unexplained weight changes. Denies hearing loss, neck lumps or masses, mouth sores, ringing in ears or voice changes. Denies cough or wheezing.  Denies shortness of breath. Denies chest pain or palpitations. Denies leg swelling. Denies abdominal distention, pain, blood in stools, constipation, diarrhea, nausea, vomiting, or early satiety. Denies pain with intercourse, dysuria, frequency, hematuria or incontinence. Denies hot flashes, pelvic pain, vaginal bleeding or vaginal discharge.   Denies back pain or muscle pain/cramps. Denies itching, rash, or wounds. Denies dizziness, headaches, numbness or seizures. Denies swollen lymph nodes or glands, denies easy bruising or bleeding. Denies anxiety, depression, confusion, or decreased concentration.  Physical Exam: BP 117/81 (BP Location: Right Arm, Patient Position: Sitting)   Pulse (!) 56   Temp 98.5 F (36.9 C) (Oral)   Resp 16   Ht '4\' 10"'  (1.473 m)   Wt 159 lb 2 oz (72.2 kg)   SpO2 100%   BMI 33.26 kg/m  General: Alert, oriented, no acute distress. HEENT: Normocephalic, atraumatic, sclera anicteric. Chest: Unlabored breathing on room air. Cardiovascular: Regular rate and rhythm, no murmurs. Abdomen: Obese, soft, nontender.  Normoactive bowel sounds.  No masses or hepatosplenomegaly appreciated.  Well-healed scar. Extremities: Grossly normal range of motion.  Warm, well perfused.  No edema bilaterally. Skin: No rashes or lesions noted. Lymphatics: No cervical, supraclavicular, or inguinal adenopathy. GU: Normal appearing external genitalia without erythema, excoriation, or lesions.  Speculum exam reveals cuff  intact, similar hyperpigmented lesion noted along the cuff that was previously biopsied.  No other lesions or discharge.  Bimanual exam reveals cuff smooth, no nodularity or masses appreciated, no tenderness.  Rectovaginal exam confirms these findings.  Laboratory & Radiologic Studies:  Ref Range & Units 2 mo ago  (01/26/20) 9 mo ago  (07/23/19) 1 yr ago  (02/13/19) 1 yr ago  (11/10/18)  Cancer Antigen (CA) 125 0.0 - 38.1 U/mL 13.8  10.8 CM  11.8 CM  12.6    CA-125: Pending from today  Assessment & Plan: Melissa Simpson is a 69 y.o. woman with Stage IC2 status post staging surgery in 05/2018 and completion of adjuvant chemotherapy in 2/2020who presents forsurveillance.  Mrs. Kage is doing well today and is NED on exam.  I will release her CA-125 results in my chart once back, likely tomorrow.  The patient is now approximately 2 years out from diagnosis and a year and a half out from completion of adjuvant therapy.  PerSGO surveillance guidelines,I recommended that wecontinue every 42-monthvisits with exam and Ca1 25 until 2 years out from treatment.   Discussed signs and symptoms that are concerning for recurrent disease and should prompt a call to be seen sooner than her next scheduled visit.  20 minutes of total time was spent for this patient encounter, including preparation, face-to-face counseling with the patient and coordination of care, and documentation of the encounter.  KJeral Pinch MD  Division of Gynecologic Oncology  Department of Obstetrics and Gynecology  [facility]

## 2020-04-20 ENCOUNTER — Inpatient Hospital Stay (HOSPITAL_BASED_OUTPATIENT_CLINIC_OR_DEPARTMENT_OTHER): Payer: Medicare HMO | Admitting: Gynecologic Oncology

## 2020-04-20 ENCOUNTER — Inpatient Hospital Stay: Payer: Medicare HMO | Attending: Gynecologic Oncology

## 2020-04-20 ENCOUNTER — Other Ambulatory Visit: Payer: Self-pay

## 2020-04-20 ENCOUNTER — Encounter: Payer: Self-pay | Admitting: Gynecologic Oncology

## 2020-04-20 VITALS — BP 117/81 | HR 56 | Temp 98.5°F | Resp 16 | Ht <= 58 in | Wt 159.1 lb

## 2020-04-20 DIAGNOSIS — Z90722 Acquired absence of ovaries, bilateral: Secondary | ICD-10-CM

## 2020-04-20 DIAGNOSIS — Z8543 Personal history of malignant neoplasm of ovary: Secondary | ICD-10-CM | POA: Insufficient documentation

## 2020-04-20 DIAGNOSIS — Z90710 Acquired absence of both cervix and uterus: Secondary | ICD-10-CM

## 2020-04-20 DIAGNOSIS — Z08 Encounter for follow-up examination after completed treatment for malignant neoplasm: Secondary | ICD-10-CM | POA: Diagnosis not present

## 2020-04-20 DIAGNOSIS — Z9221 Personal history of antineoplastic chemotherapy: Secondary | ICD-10-CM | POA: Diagnosis not present

## 2020-04-20 DIAGNOSIS — C562 Malignant neoplasm of left ovary: Secondary | ICD-10-CM

## 2020-04-20 DIAGNOSIS — Z8542 Personal history of malignant neoplasm of other parts of uterus: Secondary | ICD-10-CM | POA: Insufficient documentation

## 2020-04-20 NOTE — Patient Instructions (Addendum)
Your exam is normal today!  I will release her Ca1 25 tomorrow once I have the results.  I will see you in 3 months unless anything changes or you develop any new symptoms before then.  If so, please call [phone] to see me sooner.

## 2020-04-21 ENCOUNTER — Telehealth: Payer: Self-pay | Admitting: *Deleted

## 2020-04-21 LAB — CA 125: Cancer Antigen (CA) 125: 15.4 U/mL (ref 0.0–38.1)

## 2020-04-21 NOTE — Telephone Encounter (Signed)
Per patient request moved her appt on 11/16 to the morning

## 2020-04-29 ENCOUNTER — Other Ambulatory Visit: Payer: Medicare HMO

## 2020-04-29 ENCOUNTER — Ambulatory Visit: Payer: Medicare HMO | Admitting: Gynecologic Oncology

## 2020-05-04 DIAGNOSIS — K006 Disturbances in tooth eruption: Secondary | ICD-10-CM | POA: Diagnosis not present

## 2020-05-04 DIAGNOSIS — R69 Illness, unspecified: Secondary | ICD-10-CM | POA: Diagnosis not present

## 2020-07-13 ENCOUNTER — Telehealth: Payer: Self-pay | Admitting: *Deleted

## 2020-07-13 NOTE — Telephone Encounter (Signed)
Called the patient and left a message to call the office back. Need to adjust her appt time on 11/16

## 2020-07-25 ENCOUNTER — Encounter: Payer: Self-pay | Admitting: Gynecologic Oncology

## 2020-07-25 NOTE — Progress Notes (Signed)
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 Large pelvic mass with heterogeneous appearance, measuring at least 15 centimeters in diameter. Although the findings favor enlarged uterus/fibroids, ovaries are not well seen and an ovarian mass should also be considered. Further characterization with pelvic ultrasound is recommended. 2. The bladder is completely decompressed. There is LEFT-sided hydronephrosis secondary to extrinsic compression from the pelvic mass. 3. Irregular thickening of the sigmoid colon, suspicious for colonic wall mass, measuring 1.3 centimeters. In this patient with a family history of colon cancer, further evaluation with colonoscopy is recommended. 4. Colonic diverticula without acute diverticulitis. 5. Moderate hiatal hernia.    05/15/2018 Imaging   MRI pelvis 16 cm complex cystic and solid mass which is centered in the left posterior adnexa and is contiguous with the uterus. This is suspicious for ovarian carcinoma, with differential diagnosis of atypical pedunculated fibroid with peripheral cystic degeneration. Surgical evaluation is recommended.  Other uterine fibroids measuring up to 4.8 cm.  No evidence of pelvic metastatic disease.    05/16/2018 Imaging   US pelvis 1. Large pelvic mass described in detail on MRI examination 1 day prior. 2. Slight ureteral prominence bilaterally. Mild fullness of the left renal collecting system. No obstructing focus evident. Suspect fullness of these structures due to ureteral  compression by the sizable pelvic mass.     05/22/2018 Tumor Marker   Patient's tumor was tested for the following markers: CA-125 Results of the tumor marker test revealed 122   05/27/2018 Surgery   Preoperative Diagnoses: Pelvic mass  Postoperative Diagnoses: left fallopian tube and ovary consistent with high-grade carcinoma of likely GYN origin. No evidence of metastatic disease.  Procedures: Exploratory laparotomy, total abdominal hysterectomy, bilateral salpingo-oophorectomy, peritoneal biopsies for ovarian cancer staging, bilateral pelvic lymphadenectomy, bilateral para-aortic lymphadenectomy, infracolic omentectomy, R0 resection   Surgeon: Marti Sleigh, MD  Assistants: Tona Sensing, MD - Fellow, Haroldine Laws, MD - Resident, Feliberto Harts, PA-S  Findings: On BME, the cervix is retracted anteriorly with no palpable adnexal fullness. There is no RV septum nodularity. Abdominal exam reveals a mobile large mass that extends to the umbilicus and nearly to the pelvic side walls. On abdominal entry, there was immediate green-brown pelvic fluid that appeared consistent with a spontaneous rupture of the cystic fluid-filled mass. The left ovary was a large cystic complex mass, roughly 15cm in size with smooth surface but containing friable tumor. The mass was densely adherent along the entire posterior wall of the uterus. The uterus had some small intramural fibroids and was roughly 10cm in total size. The right fallopian tube and ovary were normal appearing. The patient has smooth paracolic gutters, liver edge, diaphragm, spleen and stomach. The omentum was retracted but no evidence of nodularity or gross tumor. The anterior cul-de-sac was clear and the posterior-cul-de-sac peritoneum was clear, but the left ovarian mass wall was adherent to the rectosigmoid and cul-de-sac. There were no palpable pelvic or para-aortic adenopathy.   Of note, an the conclusion of the case, there was mild  RIGHT hydronephrosis noted. The ureter was traced  from the level of the right common iliac into the anterior utero-vesical ligament. There was an area at the pelvic brim were the hydronephrosis resolved to normal caliber. There was no tethering, stricture, or apparent injury to the ureter. Based on exam, this seemed consistent with packing and retraction causing some transient dilation that resolved with packing and retractor removal.   Intraoperative frozen section of the left ovary was consistent with high-grade carcinoma of GYN origin, possibly serous. There was no gross evidence of disease at the conclusion of the case, R0 resection.  Specimens: 1) Pelvic washings 2) Uterus with cervix  3) Left ovary and fallopian tube, IOFS c/w high-grade GYN carcinoma 4) Right fallopian tube and ovary 5) Peritoneal biopsies - anterior cul-de-sac peritoneum, posterior cul-de-sac peritoneum, left pelvic side wall peritoneum, right pelvic side wall peritoneum 6) Right and left pelvic lymph nodes 7) Right and left para-aortic lymph nodes 8) Omentum 9) Anaerobic/Aerobic culture - preliminary read is no organisms    05/27/2018 Pathology Results   A: Ovary and fallopian tube, left, salpingo-oophorectomy - Clear cell carcinoma of left ovary, size 14.0 cm, with necrosis  - Carcinoma is adherent to fallopian tube, consistent with surface involvement (stage pT1c2) - Focal endometriosis present - See synoptic report and comment  B: Uterus with cervix and right ovary and fallopian tube, hysterectomy and right salpingo-oophorectomy Cervix: Benign with Nabothian cysts Endometrium: Endometrial polyp (size 3.5 cm); inactive endometrium with cystic glandular changes Myometrium: Leiomyomata with hyalinization and calcification (size up to 4.2 cm); focal adenomyosis  Right ovary: Benign physiologic changes Right fallopian tube: Small paratubal cyst and no malignancy identified  C: Lymph nodes, right pelvic, regional  resection - Nine lymph nodes with no metastatic carcinoma identified (0/9)  D: Lymph nodes, left pelvic, regional resection - Five lymph nodes with no metastatic carcinoma identified (0/5)  E: Cul-de-sac, anterior, biopsy - Fibroadipose tissue with no carcinoma identified  F: Cul-de-sac, posterior, biopsy - Fibrous tissue with inflammation and no carcinoma identified  G: Pelvic sidewall, right, biopsy - Fibroadipose tissue with focal crushed CD10 positive cells, cannot exclude endometrial type stroma - No carcinoma identified  H: Pelvic sidewall, left, biopsy - Fibroadipose tissue with focal crushed cells, cannot exclude endometrial type stroma - No carcinoma identified  I: Omentum, omentectomy - Adipose and fibrovascular tissue consistent with omentum - No carcinoma identified  J: Lymph nodes, left periaortic, regional resection - Two small lymph nodes with no metastatic carcinoma identified (0/2)  K: Lymph nodes, right periaortic, regional resection - Three small lymph nodes with no metastatic carcinoma identified (0/3)  This electronic signature is attestation that the pathologist personally reviewed the submitted material(s) and the final diagnosis reflects that evaluation.    Synoptic Report     OVARY or FALLOPIAN TUBE or PRIMARY PERITONEUM(Ovary FT Perit - All Specimens)    SPECIMEN  Procedure:Total hysterectomy and bilateral salpingo-oophorectomy   Procedure:Omentectomy   Procedure:Peritonealbiopsies   Procedure:Peritoneal washing   :  Specimen Integrity of Left Ovary:Received largely intact with multiple areas of disruption   Tumor Site:Left ovary   Histologic Type:Clear cell carcinoma   Histologic Grade:High grade   :  Tumor Size:Greatest dimension in Centimeters (cm): 14.0 Centimeters (cm)  Additional Dimension in Centimeters (cm):10 Centimeters (cm)  Additional Dimension in  Centimeters (cm):7 Centimeters (cm)  Tumor Extent:  Ovarian Surface Involvement:Present   Laterality:Left   Other Tissue / Organ Involvement:Not identified   Peritoneal Ascitic Fluid:Negative for malignancy (normal / benign)   Pleural Fluid:Not submitted / unknown  Accessory Findings:  LYMPH NODES  Regional Lymph Nodes:All lymph nodes negative for tumor cells   Number of Lymph Nodes Examined:19   Site(s):Right pelvic: 9   Site(s):Left pelvic: 5   Site(s):Right para-aortic: 3   Site(s):Left para-aortic: 2   PATHOLOGIC STAGE CLASSIFICATION (pTNM, AJCC 8th Edition)  Primary Tumor (pT):pT1c2   Regional Lymph Nodes (pN):pN0   FIGO STAGE  FIGO Stage:IC2   ADDITIONAL FINDINGS  Additional Pathologic Findings:Endometriosis   Additional Pathologic Findings:Benign fallopian tube with adherence to carcinoma; also see additional parts   Comment(s)  Comment(s):Also see diagnosis comment.   The frozen section diagnosis is confirmed for specimen A. Sections demonstrate a carcinoma with tubulocystic and papillary architecture with hyalinized cores, lined by cells with high grade nuclei, clear to eosinophilic cytoplasm, and nuclear hobnailing.  The morphologic features are most suggestive of clear cell carcinoma.  Immunohistochemical stains are performed, and demonstrate that the carcinoma is positive for CK7 and PAX8, consistent with gynecologic origin. It has patchy positivity for Napsin A, wild type p53 staining, and is negative for ER, supporting the diagnosis of clear cell carcinoma.   Sections of the tumor demonstrate carcinoma with adherence to fallopian tube surface, consistent with surface involvement.  Per the operative note, findings were also suggestive of preoperative rupture of the mass.  Given both these findings, the tumor is staged as a pT1c2 (FIGO IC2)            05/27/2018  Genetic Testing   Patient has genetic testing done for genetic disorder Results revealed patient has the following mutation(s): BRCA 1 on tissue block, neg blood   06/04/2018 Procedure   She had port placement   06/05/2018 Cancer Staging   Staging form: Ovary, Fallopian Tube, and Primary Peritoneal Carcinoma, AJCC 8th Edition - Pathologic: FIGO Stage IC2, calculated as Stage IC (pT1c2, pN0, cM0) - Signed by Heath Lark, MD on 06/05/2018   06/27/2018 Imaging   1. No evidence of residual/recurrent tumor in the pelvis. 2. No evidence of metastatic disease in the chest, abdomen or pelvis. 3. Left pelvic sidewall 6.3 x 5.0 cm simple fluid collection, compatible with postsurgical seroma or lymphocele. 4. Small hiatal hernia. 5. Aortic Atherosclerosis (ICD10-I70.0).   06/27/2018 Tumor Marker   Patient's tumor was tested for the following markers: CA-125 Results of the tumor marker test revealed 90.3   07/01/2018 - 10/14/2018 Chemotherapy   The patient had palonosetron (ALOXI) injection 0.25 mg, 0.25 mg, Intravenous,  Once, 6 of 6 cycles  Administration: 0.25 mg (07/01/2018), 0.25 mg (07/22/2018), 0.25 mg (08/12/2018), 0.25 mg (09/02/2018), 0.25 mg (09/23/2018), 0.25 mg (10/14/2018)    CARBOplatin (PARAPLATIN) 420 mg in sodium chloride 0.9 % 250 mL chemo infusion, 420 mg (98.3 % of original dose 425.4 mg), Intravenous,  Once, 6 of 6 cycles  Dose modification:   (original dose 425.4 mg, Cycle 1), 380 mg (original dose 425.4 mg, Cycle 5, Reason: Dose not tolerated), 380 mg (original dose 425.4 mg, Cycle 6, Reason: Dose not tolerated)  Administration: 420 mg (07/01/2018), 440 mg (07/22/2018), 380 mg (08/12/2018), 440 mg (09/02/2018), 380 mg (09/23/2018), 380 mg (10/14/2018)    PACLitaxel (TAXOL) 270 mg in sodium chloride 0.9 % 250 mL chemo infusion (> 75m/m2), 175 mg/m2 = 270 mg, Intravenous,  Once, 6 of 6 cycles  Administration: 270 mg (07/01/2018), 270 mg (07/22/2018), 270 mg (08/12/2018), 270 mg  (09/02/2018), 270 mg (09/23/2018), 270 mg (10/14/2018)    fosaprepitant (EMEND) 150 mg, dexamethasone (DECADRON) 12 mg in sodium chloride 0.9 %  145 mL IVPB, , Intravenous,  Once, 6 of 6 cycles  Administration:  (07/01/2018),  (07/22/2018),  (08/12/2018),  (09/02/2018),  (09/23/2018),  (10/14/2018)  for chemotherapy treatment.     07/15/2018 Imaging   Vascular US Findings consistent with acute deep vein thrombosis involving the right internal jugular veins. She is placed on Xarelto   07/21/2018 Tumor Marker   Patient's tumor was tested for the following markers: CA-125 Results of the tumor marker test revealed 22.8   09/22/2018 Tumor Marker   Patient's tumor was tested for the following markers: CA-125 Results of the tumor marker test revealed 14.5   11/10/2018 Imaging   Status post hysterectomy and bilateral salpingo-oophorectomy.  4.9 cm improving postoperative seroma along the left pelvic sidewall.  No evidence of recurrent or metastatic disease.    11/10/2018 Tumor Marker   Patient's tumor was tested for the following markers: CA-125 Results of the tumor marker test revealed 12.6   11/24/2018 Procedure   Successful removal of implanted Port-A-Cath.     Interval History: Patient is doing well since her last visit with me.  She and her husband have continued to travel to their house near the [facility] frequently, especially when the weather was warmer.  She is looking forward to hosting her children and grandchildren for Thanksgiving.  She endorses a good appetite without any nausea or emesis.  She reports normal bowel and bladder function.  She denies any abdominal pain, pelvic pain, vaginal bleeding, or discharge.  Past Medical/Surgical History: Past Medical History:  Diagnosis Date  . AKI (acute kidney injury) ([place]) 05/23/2018  . Arthritis    in bilateral knees  . Cancer ([place])    left ovarian cancer  . Family history of colon cancer   . Family history of  ovarian cancer   . GERD (gastroesophageal reflux disease)   . History of DVT (deep vein thrombosis) 2019   had a catheter-related DVT during chemotherapy  . Hyperlipidemia   . Hypertension   . 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 ABDOMINAL HYSTERECTOMY    . BIOPSY THYROID  2008  . COLONOSCOPY    . IR CV LINE INJECTION  07/15/2018  . IR REMOVAL TUN ACCESS W/ PORT W/O FL MOD SED  11/24/2018  . TONSILECTOMY, ADENOIDECTOMY, BILATERAL MYRINGOTOMY AND TUBES Bilateral 1957    Family History  Problem Relation Age of Onset  . Hypertension Father   . Diabetes Father   . Coronary artery disease Father   . Hyperlipidemia Father   . Colon cancer Father        33's  . Hypertension Mother   . Arthritis Mother   . Cancer Maternal Aunt        type unk  . Ovarian cancer Cousin 74  . Cancer Cousin        mouth    Social History   Socioeconomic History  . Marital status: Married    Spouse name: Louie Casa  . Number of children: 3  . Years of education: Not on file  . Highest education level: Not on file  Occupational History  . Occupation: retired Solicitor  Tobacco Use  . Smoking status: Never Smoker  . Smokeless tobacco: Never Used  Vaping Use  . Vaping Use: Never used  Substance and Sexual Activity  . Alcohol use: No  . Drug use: No  . Sexual activity: Not on file  Other Topics Concern  . Not on file  Social History Narrative   Diet:Unrestricted   Do you drink/eat things with caffeine? Rarely   Marital status: Married                             What year were you married? 1992   Do you live in a house, apartment, assisted living, condo, trailer, etc)? House   Is it one or more stories? 1   How many persons live in your home? 3   Do you have any pets in your home?  2 small dogs   Current or past profession: Chief of Staff   Do you exercise?                                                     Type & how often:   Do you  have a living will? No   Do you have a DNR Form? No   Do you have a POA/HPOA forms? NO   Social Determinants of Health   Financial Resource Strain:   . Difficulty of Paying Living Expenses: Not on file  Food Insecurity:   . Worried About Charity fundraiser in the Last Year: Not on file  . Ran Out of Food in the Last Year: Not on file  Transportation Needs:   . Lack of Transportation (Medical): Not on file  . Lack of Transportation (Non-Medical): Not on file  Physical Activity:   . Days of Exercise per Week: Not on file  . Minutes of Exercise per Session: Not on file  Stress:   . Feeling of Stress : Not on file  Social Connections:   . Frequency of Communication with Friends and Family: Not on file  . Frequency of Social Gatherings with Friends and Family: Not on file  . Attends Religious Services: Not on file  . Active Member of Clubs or Organizations: Not on file  . Attends Archivist Meetings: Not on file  . Marital Status: Not on file    Current Medications:  Current Outpatient Medications:  .  alendronate (FOSAMAX) 70 MG tablet, Take one every week with a full glass of water on an empty stomach, Disp: 12 tablet, Rfl: 3 .  BLACK COHOSH PO, Take 1 capsule by mouth daily., Disp: , Rfl:  .  Calcium-Magnesium-Vitamin D (CALCIUM 1200+D3 PO), , Disp: , Rfl:  .  Glucosamine HCl ([employer] GLUCOSAMINE PO), 2 tabs by mouth in the morning and 1 tab by mouth at night, Disp: , Rfl:  .  losartan-hydrochlorothiazide (HYZAAR) 50-12.5 MG tablet, Take 1 tablet by mouth daily., Disp: 90 tablet, Rfl: 1 .  Melatonin 10 MG CAPS, Take 1 tablet by mouth at bedtime. , Disp: , Rfl:  .  metoprolol tartrate (LOPRESSOR) 50 MG tablet, Take 1 tablet (50 mg total) by mouth 2 (two) times daily., Disp: 180 tablet, Rfl: 1 .  Multiple Vitamin (MULTIVITAMIN) tablet, Take 1 tablet by mouth 2 (two) times daily. , Disp: , Rfl:  .  Omega-3 Fatty Acids (OMEGA-3 FISH OIL PO), Take 1 tablet by mouth 2 (two)  times daily., Disp: , Rfl:  .  omeprazole (PRILOSEC) 20 MG capsule, Take 1 capsule (20 mg total) by mouth daily., Disp: 90 capsule, Rfl: 1 .  Probiotic Product (PROBIOTIC-10 PO), , Disp: , Rfl:  .  simvastatin (ZOCOR) 20 MG tablet, Take 1 tablet (20 mg total) by mouth daily., Disp: 90 tablet, Rfl: 1 .  vitamin C (ASCORBIC ACID) 500 MG tablet, Take 500 mg by mouth daily., Disp: , Rfl:   Review of Systems: Denies appetite changes, fevers, chills, fatigue, unexplained weight changes. Denies hearing loss, neck lumps or masses, mouth sores, ringing in ears or voice changes. Denies cough or wheezing.  Denies shortness of breath. Denies chest pain or palpitations. Denies leg swelling. Denies abdominal distention, pain, blood in stools, constipation, diarrhea, nausea, vomiting, or early satiety. Denies pain with intercourse, dysuria, frequency, hematuria or incontinence. Denies hot flashes, pelvic pain, vaginal bleeding or vaginal discharge.   Denies joint pain, back pain or muscle pain/cramps. Denies itching, rash, or wounds. Denies dizziness, headaches, numbness or seizures. Denies swollen lymph nodes or glands, denies easy bruising or bleeding. Denies anxiety, depression, confusion, or decreased concentration.  Physical Exam: BP (!) 141/58 (BP Location: Right Arm, Patient Position: Sitting)   Pulse (!) 59   Temp (!) 97.5 F (36.4 C) (Tympanic)   Resp 18   Wt 160 lb 3.2 oz (72.7 kg)   SpO2 97%   BMI 33.48 kg/m  General: Alert, oriented, no acute distress. HEENT: Normocephalic, atraumatic, sclera anicteric. Chest: Unlabored breathing on room air. Abdomen: Obese, soft, nontender.  Normoactive bowel sounds.  No masses or hepatosplenomegaly appreciated.  Well-healed scar. Extremities: Grossly normal range of motion.  Warm, well perfused.  No edema bilaterally. Skin: No rashes or lesions noted. Lymphatics: No cervical, supraclavicular, or inguinal adenopathy. GU: Normal appearing external  genitalia without erythema, excoriation, or lesions.  Speculum exam reveals moderately atrophic vaginal mucosa, cuff intact, no masses.  Bimanual exam reveals no nodularity or masses appreciated.  Rectovaginal exam  confirms these findings.  Laboratory & Radiologic Studies:  Ref Range & Units 3 mo ago 6 mo ago 1 yr ago  Cancer Antigen (CA) 125 0.0 - 38.1 U/mL 15.4  13.8 CM  10.8 CM    Today's CA-125 is pending  Assessment & Plan: Melissa Simpson is a 69 y.o. woman with Stage IC2 status post staging surgery in 05/2018 and completion of adjuvant chemotherapy in 2/2020who presents forsurveillance.  Patient is NED on exam today.  She is doing well without any significant symptoms.  Her Ca1 25 will result tomorrow we will contact her with these results.  PerSGO surveillance guidelines,I recommended that wecontinue every 80-monthvisits with exam and Ca1 25 until 2 years out from treatment.After her next visit in February, we will transition to surveillance visits every 4-6 months.Discussed signs and symptoms that are concerning for recurrent disease and should prompt a call to be seen sooner than her next scheduled visit.   32 minutes of total time was spent for this patient encounter, including preparation, face-to-face counseling with the patient and coordination of care, and documentation of the encounter.  KJeral Pinch MD  Division of Gynecologic Oncology  Department of Obstetrics and Gynecology  [facility]

## 2020-07-26 ENCOUNTER — Inpatient Hospital Stay: Payer: Medicare HMO | Attending: Gynecologic Oncology

## 2020-07-26 ENCOUNTER — Other Ambulatory Visit: Payer: Self-pay

## 2020-07-26 ENCOUNTER — Ambulatory Visit: Payer: Medicare HMO | Admitting: Gynecologic Oncology

## 2020-07-26 ENCOUNTER — Inpatient Hospital Stay (HOSPITAL_BASED_OUTPATIENT_CLINIC_OR_DEPARTMENT_OTHER): Payer: Medicare HMO | Admitting: Gynecologic Oncology

## 2020-07-26 VITALS — BP 141/58 | HR 59 | Temp 97.5°F | Resp 18 | Wt 160.2 lb

## 2020-07-26 DIAGNOSIS — Z90710 Acquired absence of both cervix and uterus: Secondary | ICD-10-CM | POA: Diagnosis not present

## 2020-07-26 DIAGNOSIS — Z08 Encounter for follow-up examination after completed treatment for malignant neoplasm: Secondary | ICD-10-CM | POA: Diagnosis not present

## 2020-07-26 DIAGNOSIS — C562 Malignant neoplasm of left ovary: Secondary | ICD-10-CM

## 2020-07-26 DIAGNOSIS — Z79899 Other long term (current) drug therapy: Secondary | ICD-10-CM | POA: Diagnosis not present

## 2020-07-26 DIAGNOSIS — Z9221 Personal history of antineoplastic chemotherapy: Secondary | ICD-10-CM | POA: Diagnosis not present

## 2020-07-26 DIAGNOSIS — I10 Essential (primary) hypertension: Secondary | ICD-10-CM | POA: Insufficient documentation

## 2020-07-26 DIAGNOSIS — Z90722 Acquired absence of ovaries, bilateral: Secondary | ICD-10-CM | POA: Insufficient documentation

## 2020-07-26 DIAGNOSIS — E785 Hyperlipidemia, unspecified: Secondary | ICD-10-CM | POA: Diagnosis not present

## 2020-07-26 DIAGNOSIS — Z8543 Personal history of malignant neoplasm of ovary: Secondary | ICD-10-CM

## 2020-07-26 DIAGNOSIS — K219 Gastro-esophageal reflux disease without esophagitis: Secondary | ICD-10-CM | POA: Diagnosis not present

## 2020-07-26 DIAGNOSIS — Z86718 Personal history of other venous thrombosis and embolism: Secondary | ICD-10-CM | POA: Diagnosis not present

## 2020-07-26 DIAGNOSIS — M1990 Unspecified osteoarthritis, unspecified site: Secondary | ICD-10-CM | POA: Diagnosis not present

## 2020-07-26 NOTE — Patient Instructions (Signed)
Everything is normal on your exam today!  We will call you with your CA-125 results tomorrow when they are back.  I'd like to see you 1 more time in 3 months.  After that visit, we can move to visits every 4-6 months.  Please call the clinic in early January if you have not heard from [place] to get the visit in February scheduled.  As always, if you develop any new symptoms before your next visit with me, such as change to your bowel function, bloating, or abdominal pain, please call [phone] to see me sooner.

## 2020-07-27 LAB — CA 125: Cancer Antigen (CA) 125: 12.6 U/mL (ref 0.0–38.1)

## 2020-08-10 DIAGNOSIS — H2513 Age-related nuclear cataract, bilateral: Secondary | ICD-10-CM | POA: Diagnosis not present

## 2020-08-25 ENCOUNTER — Other Ambulatory Visit: Payer: Self-pay | Admitting: Internal Medicine

## 2020-08-25 DIAGNOSIS — I10 Essential (primary) hypertension: Secondary | ICD-10-CM

## 2020-09-05 ENCOUNTER — Encounter: Payer: Self-pay | Admitting: Family

## 2020-09-05 ENCOUNTER — Other Ambulatory Visit: Payer: Self-pay

## 2020-09-05 ENCOUNTER — Ambulatory Visit (INDEPENDENT_AMBULATORY_CARE_PROVIDER_SITE_OTHER): Payer: Medicare HMO | Admitting: Family

## 2020-09-05 VITALS — BP 134/84 | HR 60 | Temp 97.1°F | Resp 16 | Ht <= 58 in | Wt 161.2 lb

## 2020-09-05 DIAGNOSIS — R399 Unspecified symptoms and signs involving the genitourinary system: Secondary | ICD-10-CM | POA: Diagnosis not present

## 2020-09-05 LAB — POCT URINALYSIS DIPSTICK
Bilirubin, UA: NEGATIVE
Glucose, UA: NEGATIVE
Ketones, UA: POSITIVE
Nitrite, UA: POSITIVE
Protein, UA: POSITIVE — AB
Spec Grav, UA: 1.025 (ref 1.010–1.025)
Urobilinogen, UA: 1 E.U./dL
pH, UA: 6 (ref 5.0–8.0)

## 2020-09-05 MED ORDER — CIPROFLOXACIN HCL 500 MG PO TABS: 500.0000 mg | ORAL_TABLET | Freq: Two times a day (BID) | ORAL | 0 refills | Status: DC

## 2020-09-05 NOTE — Progress Notes (Signed)
Provider: Jassmin Kemmerer FNP-C  Kermit Balo, DO  Patient Care Team: Kermit Balo DO as PCP - General (Geriatric Medicine)  Extended Emergency Contact Information Primary Emergency Contact: Devonshire,[name] Address: [address]          [place], [place] [address] [name] of [place] Home Phone: [phone] Mobile Phone: [phone] Relation: Spouse  Code Status:  Full Code  Goals of care: Advanced Directive information Advanced Directives 09/05/2020  Does Patient Have a Medical Advance Directive? Yes  Type of Estate agent of [place];Living will  Does patient want to make changes to medical advance directive? No - Guardian declined  Copy of Healthcare Power of Attorney in Chart? Yes - validated most recent copy scanned in chart (See row information)  Would patient like information on creating a medical advance directive? -     Chief Complaint  Patient presents with   Acute Visit    Possible UTI x 1 day.    HPI:  Pt is a 69 y.o. female seen today for an acute visit for evaluation of urine urgency,frequency and burning sensation with urination X 1 day.Also has some suprapubic "spasm like discomfort "Has chronic lower back pain which have not worsen .States does not like to drink water.She denies any flank pain,nausea,vomiting, fever or chills.States will be travelling to [place] today.    Past Medical History:  Diagnosis Date   AKI (acute kidney injury) (HCC) 05/23/2018   Arthritis    in bilateral knees   Cancer (HCC)    left ovarian cancer   Family history of colon cancer    Family history of ovarian cancer    GERD (gastroesophageal reflux disease)    History of DVT (deep vein thrombosis) 2019   had a catheter-related DVT during chemotherapy   Hyperlipidemia    Hypertension    Steroid myopathy 09/22/2018   Past Surgical History:  Procedure Laterality Date   ABDOMINAL HYSTERECTOMY     BIOPSY THYROID  2008    COLONOSCOPY     IR CV LINE INJECTION  07/15/2018   IR REMOVAL TUN ACCESS W/ PORT W/O FL MOD SED  11/24/2018   TONSILECTOMY, ADENOIDECTOMY, BILATERAL MYRINGOTOMY AND TUBES Bilateral 1957    Allergies  Allergen Reactions   Pollen Extract     Outpatient Encounter Medications as of 09/05/2020  Medication Sig   alendronate (FOSAMAX) 70 MG tablet Take one every week with a full glass of water on an empty stomach   BLACK COHOSH PO Take 1 capsule by mouth daily.   Calcium-Magnesium-Vitamin D (CALCIUM 1200+D3 PO)    Glucosamine HCl ([employer]S GLUCOSAMINE PO) 2 tabs by mouth in the morning and 1 tab by mouth at night   losartan-hydrochlorothiazide (HYZAAR) 50-12.5 MG tablet Take 1 tablet by mouth daily.   Melatonin 10 MG CAPS Take 1 tablet by mouth at bedtime.    metoprolol tartrate (LOPRESSOR) 50 MG tablet Take 1 tablet (50 mg total) by mouth 2 (two) times daily.   Multiple Vitamin (MULTIVITAMIN) tablet Take 1 tablet by mouth 2 (two) times daily.    Omega-3 Fatty Acids (OMEGA-3 FISH OIL PO) Take 1 tablet by mouth 2 (two) times daily.   omeprazole (PRILOSEC) 20 MG capsule Take 1 capsule (20 mg total) by mouth daily.   Probiotic Product (PROBIOTIC-10 PO)    simvastatin (ZOCOR) 20 MG tablet Take 1 tablet (20 mg total) by mouth daily.   vitamin C (ASCORBIC ACID) 500 MG tablet Take 500 mg by mouth  daily.   No facility-administered encounter medications on file as of 09/05/2020.    Review of Systems  Constitutional: Negative for appetite change, chills, fatigue and fever.  Respiratory: Negative for cough, chest tightness, shortness of breath and wheezing.   Cardiovascular: Negative for chest pain, palpitations and leg swelling.  Gastrointestinal: Negative for abdominal distention, constipation, diarrhea, nausea and vomiting.       Suprapubic spasm like discomfort.  Genitourinary: Positive for dysuria, frequency and urgency. Negative for decreased urine volume, difficulty urinating,  flank pain, hematuria, pelvic pain and vaginal bleeding.  Musculoskeletal: Positive for arthralgias and back pain. Negative for gait problem and joint swelling.  Skin: Negative for color change, pallor and rash.    Immunization History  Administered Date(s) Administered   Influenza, High Dose Seasonal PF 06/10/2017, 06/09/2018, 06/03/2019   Influenza-Unspecified 06/11/2014, 05/27/2015, 06/27/2016   PFIZER SARS-COV-2 Vaccination 04/12/2020, 05/03/2020   Pneumococcal Conjugate-13 07/06/2014   Pneumococcal Polysaccharide-23 08/29/2015   Tdap 06/23/2012   Zoster Recombinat (Shingrix) 09/18/2018, 12/22/2018   Pertinent  Health Maintenance Due  Topic Date Due   INFLUENZA VACCINE  04/10/2020   MAMMOGRAM  11/18/2021   COLONOSCOPY  11/06/2026   DEXA SCAN  Completed   PNA vac Low Risk Adult  Completed   Fall Risk  09/05/2020 03/21/2020 09/21/2019 08/28/2019 02/26/2019  Falls in the past year? 0 0 0 0 0  Number falls in past yr: 0 0 0 0 0  Injury with Fall? 0 0 0 0 0   Functional Status Survey:    Vitals:   09/05/20 0900  BP: 134/84  Pulse: 60  Resp: 16  Temp: (!) 97.1 F (36.2 C)  SpO2: 95%  Weight: 161 lb 3.2 oz (73.1 kg)  Height: 4\' 10"  (1.473 m)   Body mass index is 33.69 kg/m. Physical Exam Vitals reviewed.  Constitutional:      General: She is not in acute distress.    Appearance: She is obese. She is not ill-appearing.  HENT:     Head: Normocephalic.  Cardiovascular:     Rate and Rhythm: Normal rate and regular rhythm.     Pulses: Normal pulses.     Heart sounds: Normal heart sounds. No murmur heard. No friction rub. No gallop.   Pulmonary:     Effort: Pulmonary effort is normal. No respiratory distress.     Breath sounds: Normal breath sounds. No wheezing, rhonchi or rales.  Chest:     Chest wall: No tenderness.  Abdominal:     General: Bowel sounds are normal. There is no distension.     Palpations: Abdomen is soft. There is no mass.      Tenderness: There is no abdominal tenderness. There is no right CVA tenderness, left CVA tenderness, guarding or rebound.  Musculoskeletal:        General: No swelling or tenderness. Normal range of motion.     Right lower leg: No edema.     Left lower leg: No edema.  Neurological:     Mental Status: She is alert and oriented to person, place, and time.     Motor: No weakness.     Coordination: Coordination normal.     Gait: Gait normal.  Psychiatric:        Mood and Affect: Mood normal.        Behavior: Behavior normal.        Thought Content: Thought content normal.        Judgment: Judgment normal.    Labs reviewed:  Recent Labs    03/17/20 0936  NA 140  K 3.9  CL 107  CO2 27  GLUCOSE 114*  BUN 20  CREATININE 1.01*  CALCIUM 9.7   Recent Labs    03/17/20 0936  AST 22  ALT 24  BILITOT 0.9  PROT 7.0   Recent Labs    03/17/20 0936  WBC 5.3  NEUTROABS 3,281  HGB 14.5  HCT 42.2  MCV 100.0  PLT 272   Lab Results  Component Value Date   TSH 3.030 06/02/2015   Lab Results  Component Value Date   HGBA1C 5.9 (H) 03/17/2020   Lab Results  Component Value Date   CHOL 144 03/17/2020   HDL 43 (L) 03/17/2020   LDLCALC 67 03/17/2020   TRIG 247 (H) 03/17/2020   CHOLHDL 3.3 03/17/2020    Significant Diagnostic Results in last 30 days:  No results found.  Assessment/Plan   Symptoms of urinary tract infection Afebrile.Reports increased urine frequency,urgency,burning sensation and suprapubic spasm.Negative exam finding. - POC Urinalysis Dipstick indicates dark yellow cloudy urine,positive for Ketones,protein large blood,Large Leukocytes 3+ and positive Nitrites.Advised urine will be send for culture.Request to start on antibiotics since she will be travelling out of town to [place] today.Aware if culture is not sensitive to prescribed antibiotics will need to order another antibiotics.If new another antibiotics is required,she request to be called to verify  if still in [place] on Thursday then send antibiotic to [facility] in [place].   - Advised to increase water intake to at least 6 - 8 glasses of water daily. - Additional Education Information provided on AVS - Start on Cipro 500 mg tablet one by mouth twice daily x 7 days.Side effects discussed.taking Probiotics on a daily basis.Has taken Cipro in the past which has worked well for her.  - Advised to Notify provider or go to ED if symptoms worsen or fail to improve.   - Culture, Urine - ciprofloxacin (CIPRO) 500 MG tablet; Take 1 tablet (500 mg total) by mouth 2 (two) times daily for 7 days.  Dispense: 14 tablet; Refill: 0   Family/ staff Communication: Reviewed plan of care with patient  Labs/tests ordered: - Culture, Urine  Next Appointment: As needed if symptoms worsen or fail to improve.   Sandrea Hughs, NP

## 2020-09-05 NOTE — Patient Instructions (Signed)
Urinary Tract Infection, Adult A urinary tract infection (UTI) is an infection of any part of the urinary tract. The urinary tract includes:  The kidneys.  The ureters.  The bladder.  The urethra. These organs make, store, and get rid of pee (urine) in the body. What are the causes? This is caused by germs (bacteria) in your genital area. These germs grow and cause swelling (inflammation) of your urinary tract. What increases the risk? You are more likely to develop this condition if:  You have a small, thin tube (catheter) to drain pee.  You cannot control when you pee or poop (incontinence).  You are female, and: ? You use these methods to prevent pregnancy:  A medicine that kills sperm (spermicide).  A device that blocks sperm (diaphragm). ? You have low levels of a female hormone (estrogen). ? You are pregnant.  You have genes that add to your risk.  You are sexually active.  You take antibiotic medicines.  You have trouble peeing because of: ? A prostate that is bigger than normal, if you are female. ? A blockage in the part of your body that drains pee from the bladder (urethra). ? A kidney stone. ? A nerve condition that affects your bladder (neurogenic bladder). ? Not getting enough to drink. ? Not peeing often enough.  You have other conditions, such as: ? Diabetes. ? A weak disease-fighting system (immune system). ? Sickle cell disease. ? Gout. ? Injury of the spine. What are the signs or symptoms? Symptoms of this condition include:  Needing to pee right away (urgently).  Peeing often.  Peeing small amounts often.  Pain or burning when peeing.  Blood in the pee.  Pee that smells bad or not like normal.  Trouble peeing.  Pee that is cloudy.  Fluid coming from the vagina, if you are female.  Pain in the belly or lower back. Other symptoms include:  Throwing up (vomiting).  No urge to eat.  Feeling mixed up (confused).  Being tired  and grouchy (irritable).  A fever.  Watery poop (diarrhea). How is this treated? This condition may be treated with:  Antibiotic medicine.  Other medicines.  Drinking enough water. Follow these instructions at home:  Medicines  Take over-the-counter and prescription medicines only as told by your doctor.  If you were prescribed an antibiotic medicine, take it as told by your doctor. Do not stop taking it even if you start to feel better. General instructions  Make sure you: ? Pee until your bladder is empty. ? Do not hold pee for a long time. ? Empty your bladder after sex. ? Wipe from front to back after pooping if you are a female. Use each tissue one time when you wipe.  Drink enough fluid to keep your pee pale yellow.  Keep all follow-up visits as told by your doctor. This is important. Contact a doctor if:  You do not get better after 1-2 days.  Your symptoms go away and then come back. Get help right away if:  You have very bad back pain.  You have very bad pain in your lower belly.  You have a fever.  You are sick to your stomach (nauseous).  You are throwing up. Summary  A urinary tract infection (UTI) is an infection of any part of the urinary tract.  This condition is caused by germs in your genital area.  There are many risk factors for a UTI. These include having a small, thin   tube to drain pee and not being able to control when you pee or poop.  Treatment includes antibiotic medicines for germs.  Drink enough fluid to keep your pee pale yellow. This information is not intended to replace advice given to you by your health care provider. Make sure you discuss any questions you have with your health care provider. Document Revised: 08/14/2018 Document Reviewed: 03/06/2018 Elsevier Patient Education  2020 Elsevier Inc.  

## 2020-09-07 ENCOUNTER — Other Ambulatory Visit: Payer: Self-pay

## 2020-09-07 DIAGNOSIS — R399 Unspecified symptoms and signs involving the genitourinary system: Secondary | ICD-10-CM

## 2020-09-07 LAB — URINE CULTURE
MICRO NUMBER:: 11359598
SPECIMEN QUALITY:: ADEQUATE

## 2020-09-07 NOTE — Progress Notes (Unsigned)
Cipro taken off medication list. Medication sent into requested pharmacy for patient. Medication has warning. Pend and sent to provider Richarda Blade, NP for approval. Medication resent due to patient having incorrect pharmacy. Correct pharmacy "[address] [place], [place]"

## 2020-09-08 ENCOUNTER — Telehealth: Payer: Self-pay | Admitting: *Deleted

## 2020-09-08 DIAGNOSIS — N3001 Acute cystitis with hematuria: Secondary | ICD-10-CM

## 2020-09-08 MED ORDER — NITROFURANTOIN MACROCRYSTAL 100 MG PO CAPS: 100.0000 mg | ORAL_CAPSULE | Freq: Two times a day (BID) | ORAL | 0 refills | Status: AC

## 2020-09-08 NOTE — Telephone Encounter (Signed)
Patient reached out per MyChart and stated that her antibiotic was not sent to pharmacy. (see Below) Rx Pended and sent to [facility] for approval due to HIGH ALERT Warning.       From: Lodema Hong "[name]"    Created: 09/07/2020 8:05 PM     *-*-*This message has not been handled.*-*-*  Received call to stop ciprofloxacin and start new prescription. Asked her to call it in to [facility] in [place], [place] and tell them I'd pick it up tonight.  Gave her their phone number.  Went to pick up new Rx and they had not received it & did not have me in their system.  Do not know what went wrong, but I did not get it.  I am continuing ciprofloxacin until I get new Rx.    We Melissa Simpson be going home to [facility] Thursday, so just send new prescription to [facility] on [address] [facility]. I will pick it up when we get home.   Thanks.

## 2020-09-19 ENCOUNTER — Other Ambulatory Visit: Payer: Self-pay

## 2020-09-19 ENCOUNTER — Other Ambulatory Visit: Payer: Medicare HMO

## 2020-09-19 ENCOUNTER — Other Ambulatory Visit: Payer: Self-pay | Admitting: Internal Medicine

## 2020-09-19 DIAGNOSIS — R739 Hyperglycemia, unspecified: Secondary | ICD-10-CM

## 2020-09-19 DIAGNOSIS — I10 Essential (primary) hypertension: Secondary | ICD-10-CM | POA: Diagnosis not present

## 2020-09-19 DIAGNOSIS — E781 Pure hyperglyceridemia: Secondary | ICD-10-CM | POA: Diagnosis not present

## 2020-09-20 ENCOUNTER — Telehealth: Payer: Self-pay

## 2020-09-20 ENCOUNTER — Ambulatory Visit (INDEPENDENT_AMBULATORY_CARE_PROVIDER_SITE_OTHER): Payer: Medicare HMO | Admitting: Nurse Practitioner

## 2020-09-20 ENCOUNTER — Encounter: Payer: Self-pay | Admitting: Nurse Practitioner

## 2020-09-20 DIAGNOSIS — Z0000 Encounter for general adult medical examination without abnormal findings: Secondary | ICD-10-CM | POA: Diagnosis not present

## 2020-09-20 LAB — LIPID PANEL
Cholesterol: 142 mg/dL (ref <200)
HDL: 44 mg/dL — ABNORMAL LOW (ref >=50)
LDL Cholesterol (Calc): 72 mg/dL (calc)
Non-HDL Cholesterol (Calc): 98 mg/dL (calc) (ref <130)
Total CHOL/HDL Ratio: 3.2 (calc) (ref <5.0)
Triglycerides: 193 mg/dL — ABNORMAL HIGH (ref <150)

## 2020-09-20 LAB — COMPLETE METABOLIC PANEL WITH GFR
AG Ratio: 1.8 (calc) (ref 1.0–2.5)
ALT: 22 U/L (ref 6–29)
AST: 20 U/L (ref 10–35)
Albumin: 4.4 g/dL (ref 3.6–5.1)
Alkaline phosphatase (APISO): 28 U/L — ABNORMAL LOW (ref 37–153)
BUN/Creatinine Ratio: 18 (calc) (ref 6–22)
BUN: 19 mg/dL (ref 7–25)
CO2: 28 mmol/L (ref 20–32)
Calcium: 9.5 mg/dL (ref 8.6–10.4)
Chloride: 103 mmol/L (ref 98–110)
Creat: 1.08 mg/dL — ABNORMAL HIGH (ref 0.60–0.93)
GFR, Est African American: 59 mL/min/{1.73_m2} — ABNORMAL LOW (ref >=60)
GFR, Est Non African American: 51 mL/min/{1.73_m2} — ABNORMAL LOW (ref >=60)
Globulin: 2.4 g/dL (calc) (ref 1.9–3.7)
Glucose, Bld: 110 mg/dL — ABNORMAL HIGH (ref 65–99)
Potassium: 3.9 mmol/L (ref 3.5–5.3)
Sodium: 139 mmol/L (ref 135–146)
Total Bilirubin: 0.8 mg/dL (ref 0.2–1.2)
Total Protein: 6.8 g/dL (ref 6.1–8.1)

## 2020-09-20 LAB — HEMOGLOBIN A1C
Hgb A1c MFr Bld: 6.1 % of total Hgb — ABNORMAL HIGH (ref <5.7)
Mean Plasma Glucose: 128 mg/dL
eAG (mmol/L): 7.1 mmol/L

## 2020-09-20 NOTE — Patient Instructions (Signed)
Melissa Simpson , Thank you for taking time to come for your Medicare Wellness Visit. I appreciate your ongoing commitment to your health goals. Please review the following plan we discussed and let me know if I can assist you in the future.   Screening recommendations/referrals: Colonoscopy up to date Mammogram DUE in March 2022 Bone Density DUE in June 2022 Recommended yearly ophthalmology/optometry visit for glaucoma screening and checkup Recommended yearly dental visit for hygiene and checkup  Vaccinations: Influenza vaccine up to date Pneumococcal vaccine up to date Tdap vaccine up to date Shingles vaccine up to date    Advanced directives: on file.   Conditions/risks identified: cardiovascular disease, obesity.   Next appointment: 1 year   Preventive Care 72 Years and Older, Female Preventive care refers to lifestyle choices and visits with your health care provider that can promote health and wellness. What does preventive care include?  A yearly physical exam. This is also called an annual well check.  Dental exams once or twice a year.  Routine eye exams. Ask your health care provider how often you should have your eyes checked.  Personal lifestyle choices, including:  Daily care of your teeth and gums.  Regular physical activity.  Eating a healthy diet.  Avoiding tobacco and drug use.  Limiting alcohol use.  Practicing safe sex.  Taking low-dose aspirin every day.  Taking vitamin and mineral supplements as recommended by your health care provider. What happens during an annual well check? The services and screenings done by your health care provider during your annual well check will depend on your age, overall health, lifestyle risk factors, and family history of disease. Counseling  Your health care provider may ask you questions about your:  Alcohol use.  Tobacco use.  Drug use.  Emotional well-being.  Home and relationship well-being.  Sexual  activity.  Eating habits.  History of falls.  Memory and ability to understand (cognition).  Work and work Statistician.  Reproductive health. Screening  You may have the following tests or measurements:  Height, weight, and BMI.  Blood pressure.  Lipid and cholesterol levels. These may be checked every 5 years, or more frequently if you are over 19 years old.  Skin check.  Lung cancer screening. You may have this screening every year starting at age 60 if you have a 30-pack-year history of smoking and currently smoke or have quit within the past 15 years.  Fecal occult blood test (FOBT) of the stool. You may have this test every year starting at age 35.  Flexible sigmoidoscopy or colonoscopy. You may have a sigmoidoscopy every 5 years or a colonoscopy every 10 years starting at age 75.  Hepatitis C blood test.  Hepatitis B blood test.  Sexually transmitted disease (STD) testing.  Diabetes screening. This is done by checking your blood sugar (glucose) after you have not eaten for a while (fasting). You may have this done every 1-3 years.  Bone density scan. This is done to screen for osteoporosis. You may have this done starting at age 32.  Mammogram. This may be done every 1-2 years. Talk to your health care provider about how often you should have regular mammograms. Talk with your health care provider about your test results, treatment options, and if necessary, the need for more tests. Vaccines  Your health care provider may recommend certain vaccines, such as:  Influenza vaccine. This is recommended every year.  Tetanus, diphtheria, and acellular pertussis (Tdap, Td) vaccine. You may need a  Td booster every 10 years.  Zoster vaccine. You may need this after age 50.  Pneumococcal 13-valent conjugate (PCV13) vaccine. One dose is recommended after age 39.  Pneumococcal polysaccharide (PPSV23) vaccine. One dose is recommended after age 58. Talk to your health care  provider about which screenings and vaccines you need and how often you need them. This information is not intended to replace advice given to you by your health care provider. Make sure you discuss any questions you have with your health care provider. Document Released: 09/23/2015 Document Revised: 05/16/2016 Document Reviewed: 06/28/2015 Elsevier Interactive Patient Education  [address] Prevention in the Home Falls can cause injuries. They can happen to people of all ages. There are many things you can do to make your home safe and to help prevent falls. What can I do on the outside of my home?  Regularly fix the edges of walkways and driveways and fix any cracks.  Remove anything that might make you trip as you walk through a door, such as a raised step or threshold.  Trim any bushes or trees on the path to your home.  Use bright outdoor lighting.  Clear any walking paths of anything that might make someone trip, such as rocks or tools.  Regularly check to see if handrails are loose or broken. Make sure that both sides of any steps have handrails.  Any raised decks and porches should have guardrails on the edges.  Have any leaves, snow, or ice cleared regularly.  Use sand or salt on walking paths during winter.  Clean up any spills in your garage right away. This includes oil or grease spills. What can I do in the bathroom?  Use night lights.  Install grab bars by the toilet and in the tub and shower. Do not use towel bars as grab bars.  Use non-skid mats or decals in the tub or shower.  If you need to sit down in the shower, use a plastic, non-slip stool.  Keep the floor dry. Clean up any water that spills on the floor as soon as it happens.  Remove soap buildup in the tub or shower regularly.  Attach bath mats securely with double-sided non-slip rug tape.  Do not have throw rugs and other things on the floor that can make you trip. What can I do in  the bedroom?  Use night lights.  Make sure that you have a light by your bed that is easy to reach.  Do not use any sheets or blankets that are too big for your bed. They should not hang down onto the floor.  Have a firm chair that has side arms. You can use this for support while you get dressed.  Do not have throw rugs and other things on the floor that can make you trip. What can I do in the kitchen?  Clean up any spills right away.  Avoid walking on wet floors.  Keep items that you use a lot in easy-to-reach places.  If you need to reach something above you, use a strong step stool that has a grab bar.  Keep electrical cords out of the way.  Do not use floor polish or wax that makes floors slippery. If you must use wax, use non-skid floor wax.  Do not have throw rugs and other things on the floor that can make you trip. What can I do with my stairs?  Do not leave any items on the stairs.  Make sure that there are handrails on both sides of the stairs and use them. Fix handrails that are broken or loose. Make sure that handrails are as long as the stairways.  Check any carpeting to make sure that it is firmly attached to the stairs. Fix any carpet that is loose or worn.  Avoid having throw rugs at the top or bottom of the stairs. If you do have throw rugs, attach them to the floor with carpet tape.  Make sure that you have a light switch at the top of the stairs and the bottom of the stairs. If you do not have them, ask someone to add them for you. What else can I do to help prevent falls?  Wear shoes that:  Do not have high heels.  Have rubber bottoms.  Are comfortable and fit you well.  Are closed at the toe. Do not wear sandals.  If you use a stepladder:  Make sure that it is fully opened. Do not climb a closed stepladder.  Make sure that both sides of the stepladder are locked into place.  Ask someone to hold it for you, if possible.  Clearly mark and  make sure that you can see:  Any grab bars or handrails.  First and last steps.  Where the edge of each step is.  Use tools that help you move around (mobility aids) if they are needed. These include:  Canes.  Walkers.  Scooters.  Crutches.  Turn on the lights when you go into a dark area. Replace any light bulbs as soon as they burn out.  Set up your furniture so you have a clear path. Avoid moving your furniture around.  If any of your floors are uneven, fix them.  If there are any pets around you, be aware of where they are.  Review your medicines with your doctor. Some medicines can make you feel dizzy. This can increase your chance of falling. Ask your doctor what other things that you can do to help prevent falls. This information is not intended to replace advice given to you by your health care provider. Make sure you discuss any questions you have with your health care provider. Document Released: 06/23/2009 Document Revised: 02/02/2016 Document Reviewed: 10/01/2014 Elsevier Interactive Patient Education  2017 [employer].

## 2020-09-20 NOTE — Progress Notes (Signed)
This service is provided via telemedicine  No vital signs collected/recorded due to the encounter was a telemedicine visit.   Location of patient (ex: home, work):  Home  Patient consents to a telephone visit:  Yes, see encounter dated 09/20/2020  Location of the provider (ex: office, home):  [place]  Name of any referring provider: Hollace Kinnier, DO  Names of all persons participating in the telemedicine service and their role in the encounter: Sherrie Mustache, Nurse Practitioner, Carroll Kinds, CMA, and patient.   Time spent on call: 8 minutes with medical assistant

## 2020-09-20 NOTE — Progress Notes (Signed)
Sugar average trended up.  Rest are stable.  Happy belated birthday--maybe we can blame the cake.  We'll discuss at the visit.  :)

## 2020-09-20 NOTE — Progress Notes (Signed)
Subjective:   Melissa Simpson is a 70 y.o. female who presents for Medicare Annual (Subsequent) preventive examination.  Review of Systems     Cardiac Risk Factors include: sedentary lifestyle;obesity (BMI >30kg/m2);hypertension;dyslipidemia;advanced age (>37men, >34 women)     Objective:    There were no vitals filed for this visit. There is no height or weight on file to calculate BMI.  Advanced Directives 09/20/2020 09/05/2020 07/25/2020 03/21/2020 09/21/2019 11/24/2018 09/15/2018  Does Patient Have a Medical Advance Directive? Yes Yes Yes Yes Yes Yes No  Type of Paramedic of [place];Living will [place];Living will - [place];Living will -  Does patient want to make changes to medical advance directive? No - Patient declined No - Guardian declined - No - Patient declined No - Patient declined No - Patient declined -  Copy of Excel in Chart? Yes - validated most recent copy scanned in chart (See row information) Yes - validated most recent copy scanned in chart (See row information) - - - No - copy requested -  Would patient like information on creating a medical advance directive? - - - - - No - Patient declined Yes (MAU/Ambulatory/Procedural Areas - Information given)    Current Medications (verified) Outpatient Encounter Medications as of 09/20/2020  Medication Sig  . alendronate (FOSAMAX) 70 MG tablet Take one every week with a full glass of water on an empty stomach  . Ascorbic Acid (VITAMIN C PO) Take 600 mg by mouth daily.  . Biotin 10000 MCG TABS Take 1 tablet by mouth daily.  Marland Kitchen BLACK COHOSH PO Take 1 capsule by mouth daily.  . Calcium-Magnesium-Vitamin D (CALCIUM 1200+D3 PO)   . Glucosamine HCl ([facility] GLUCOSAMINE PO) 2 tabs by mouth in the morning and 1 tab by mouth at night  . losartan-hydrochlorothiazide (HYZAAR) 50-12.5 MG tablet Take 1 tablet by mouth daily.   . Melatonin 10 MG CAPS Take 1 tablet by mouth at bedtime.   . metoprolol tartrate (LOPRESSOR) 50 MG tablet Take 1 tablet (50 mg total) by mouth 2 (two) times daily.  . Multiple Vitamin (MULTIVITAMIN) tablet Take 1 tablet by mouth 2 (two) times daily.   . Omega-3 Fatty Acids (OMEGA-3 FISH OIL PO) Take 1 tablet by mouth 2 (two) times daily.  Marland Kitchen omeprazole (PRILOSEC) 20 MG capsule Take 1 capsule (20 mg total) by mouth daily.  . Probiotic Product (PROBIOTIC-10 PO)   . simvastatin (ZOCOR) 20 MG tablet TAKE 1 TABLET EVERY DAY   No facility-administered encounter medications on file as of 09/20/2020.    Allergies (verified) Pollen extract   History: Past Medical History:  Diagnosis Date  . AKI (acute kidney injury) ([place]) 05/23/2018  . Arthritis    in bilateral knees  . Cancer ([place])    left ovarian cancer  . Family history of colon cancer   . Family history of ovarian cancer   . GERD (gastroesophageal reflux disease)   . History of DVT (deep vein thrombosis) 2019   had a catheter-related DVT during chemotherapy  . Hyperlipidemia   . Hypertension   . 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 ABDOMINAL HYSTERECTOMY    . BIOPSY THYROID  2008  . COLONOSCOPY    . IR CV LINE INJECTION  07/15/2018  . IR REMOVAL TUN ACCESS W/ PORT W/O FL MOD SED  11/24/2018  . TONSILECTOMY, ADENOIDECTOMY, BILATERAL MYRINGOTOMY AND TUBES  Bilateral 1957   Family History  Problem Relation Age of Onset  . Hypertension Father   . Diabetes Father   . Coronary artery disease Father   . Hyperlipidemia Father   . Colon cancer Father        80's  . Hypertension Mother   . Arthritis Mother   . Cancer Maternal Aunt        type unk  . Ovarian cancer Cousin 80  . Cancer Cousin        mouth   Social History   Socioeconomic History  . Marital status: Married    Spouse name: Louie Casa  . Number of children: 3  . Years of education: Not on file  . Highest education level:  Not on file  Occupational History  . Occupation: retired Solicitor  Tobacco Use  . Smoking status: Never Smoker  . Smokeless tobacco: Never Used  Vaping Use  . Vaping Use: Never used  Substance and Sexual Activity  . Alcohol use: No  . Drug use: No  . Sexual activity: Not on file  Other Topics Concern  . Not on file  Social History Narrative   Diet:Unrestricted   Do you drink/eat things with caffeine? Rarely   Marital status: Married                             What year were you married? 1992   Do you live in a house, apartment, assisted living, condo, trailer, etc)? House   Is it one or more stories? 1   How many persons live in your home? 3   Do you have any pets in your home?  2 small dogs   Current or past profession: Chief of Staff   Do you exercise?                                                     Type & how often:   Do you have a living will? No   Do you have a DNR Form? No   Do you have a POA/HPOA forms? NO   Social Determinants of Radio broadcast assistant Strain: Not on file  Food Insecurity: Not on file  Transportation Needs: Not on file  Physical Activity: Not on file  Stress: Not on file  Social Connections: Not on file    Tobacco Counseling Counseling given: Not Answered   Clinical Intake:  Pre-visit preparation completed: Yes  Pain : No/denies pain     BMI - recorded: 33 Nutritional Status: BMI > 30  Obese Nutritional Risks: None Diabetes: No  How often do you need to have someone help you when you read instructions, pamphlets, or other written materials from your doctor or pharmacy?: 1 - Never  Diabetic?no         Activities of Daily Living In your present state of health, do you have any difficulty performing the following activities: 09/20/2020  Hearing? N  Vision? N  Difficulty concentrating or making decisions? Y  Comment some trouble remembering  Walking or climbing stairs? N  Dressing or bathing? N  Doing  errands, shopping? N  Preparing Food and eating ? N  Using the Toilet? N  In the past six months, have you accidently leaked urine? Y  Comment SI  Do you have problems with loss of bowel control? N  Managing your Medications? N  Managing your Finances? N  Housekeeping or managing your Housekeeping? N  Some recent data might be hidden    Patient Care Team: Gayland Curry, DO as PCP - General (Geriatric Medicine)  Indicate any recent Medical Services you may have received from other than [facility] providers in the past year (date may be approximate).     Assessment:   This is a routine wellness examination for Deepti.  Hearing/Vision screen  Hearing Screening   125Hz  250Hz  500Hz  1000Hz  2000Hz  3000Hz  4000Hz  6000Hz  8000Hz   Right ear:           Left ear:           Comments: Patient has no hearing problems.  Vision Screening Comments: Patient wears glasses. Patient had eye exam in December. Patient uses [facility].  Dietary issues and exercise activities discussed: Current Exercise Habits: The patient does not participate in regular exercise at present  Goals    .  DIET - INCREASE WATER INTAKE (pt-stated)      To increase water intake to twice as much as current to increase to 50 oz    .  Patient Stated      To get rid of knee pain      Depression Screen PHQ 2/9 Scores 09/20/2020 09/21/2019 08/28/2019 02/26/2019 09/18/2018 03/17/2018 09/05/2017  PHQ - 2 Score 0 0 0 0 0 0 0    Fall Risk Fall Risk  09/20/2020 09/05/2020 03/21/2020 09/21/2019 08/28/2019  Falls in the past year? 1 0 0 0 0  Comment Patient fell off ladder putting up decorations - - - -  Number falls in past yr: 1 0 0 0 0  Injury with Fall? 0 0 0 0 0    FALL RISK PREVENTION PERTAINING TO THE HOME:  Any stairs in or around the home? No  If so, are there any without handrails? No  Home free of loose throw rugs in walkways, pet beds, electrical cords, etc? Yes  Adequate lighting in your home to reduce risk of falls?  Yes   ASSISTIVE DEVICES UTILIZED TO PREVENT FALLS:  Life alert? No  Use of a cane, walker or w/c? No  Grab bars in the bathroom? Yes  Shower chair or bench in shower? Yes  Elevated toilet seat or a handicapped toilet? Yes   TIMED UP AND GO:  Was the test performed? No .    Cognitive Function: MMSE - Mini Mental State Exam 09/15/2018 09/05/2017 08/30/2016  Orientation to time 5 5 5   Orientation to Place 5 5 5   Registration 3 3 3   Attention/ Calculation 5 5 5   Recall 3 2 3   Language- name 2 objects 2 2 2   Language- repeat 1 1 1   Language- follow 3 step command 3 3 3   Language- read & follow direction 1 1 1   Write a sentence 1 1 1   Copy design 1 1 1   Total score 30 29 30      6CIT Screen 09/20/2020  What Year? 0 points  What month? 0 points  What time? 0 points  Count back from 20 0 points  Months in reverse 0 points  Repeat phrase 0 points  Total Score 0    Immunizations Immunization History  Administered Date(s) Administered  . Influenza, High Dose Seasonal PF 06/10/2017, 06/09/2018, 06/03/2019  . Influenza-Unspecified 06/11/2014, 05/27/2015, 06/27/2016  . PFIZER SARS-COV-2 Vaccination 04/12/2020, 05/03/2020  . Pneumococcal  Conjugate-13 07/06/2014  . Pneumococcal Polysaccharide-23 08/29/2015  . Tdap 06/23/2012  . Zoster Recombinat (Shingrix) 09/18/2018, 12/22/2018    TDAP status: Up to date  Flu Vaccine status: Up to date  Pneumococcal vaccine status: Up to date  Covid-19 vaccine status: Completed vaccines  Qualifies for Shingles Vaccine? Yes   Zostavax completed No   Shingrix Completed?: Yes  Screening Tests Health Maintenance  Topic Date Due  . INFLUENZA VACCINE  04/10/2020  . COVID-19 Vaccine (3 - Pfizer risk 4-dose series) 05/31/2020  . MAMMOGRAM  11/18/2021  . TETANUS/TDAP  06/23/2022  . COLONOSCOPY (Pts 45-44yrs Insurance coverage will need to be confirmed)  11/06/2026  . DEXA SCAN  Completed  . Hepatitis C Screening  Completed  . PNA vac  Low Risk Adult  Completed    Health Maintenance  Health Maintenance Due  Topic Date Due  . INFLUENZA VACCINE  04/10/2020  . COVID-19 Vaccine (3 - Pfizer risk 4-dose series) 05/31/2020    Colorectal cancer screening: Type of screening: Colonoscopy. Completed 2018. Repeat every 10 years  Mammogram status: Completed 11/2019. Repeat every year  Bone Density status: Completed 03/04/2019. Results reflect: Bone density results: OSTEOPENIA. Repeat every 2 years.  Lung Cancer Screening: (Low Dose CT Chest recommended if Age 87-80 years, 30 pack-year currently smoking OR have quit w/in 15years.) does not qualify.   Lung Cancer Screening Referral: na  Additional Screening:  Hepatitis C Screening: does qualify; Completed 2020   Vision Screening: Recommended annual ophthalmology exams for early detection of glaucoma and other disorders of the eye. Is the patient up to date with their annual eye exam?  Yes  Who is the provider or what is the name of the office in which the patient attends annual eye exams? [facility] If pt is not established with a provider, would they like to be referred to a provider to establish care? No .   Dental Screening: Recommended annual dental exams for proper oral hygiene  Community Resource Referral / Chronic Care Management: CRR required this visit?  No   CCM required this visit?  No      Plan:     I have personally reviewed and noted the following in the patient's chart:   . Medical and social history . Use of alcohol, tobacco or illicit drugs  . Current medications and supplements . Functional ability and status . Nutritional status . Physical activity . Advanced directives . List of other physicians . Hospitalizations, surgeries, and ER visits in previous 12 months . Vitals . Screenings to include cognitive, depression, and falls . Referrals and appointments  In addition, I have reviewed and discussed with patient certain preventive  protocols, quality metrics, and best practice recommendations. A written personalized care plan for preventive services as well as general preventive health recommendations were provided to patient.     Lauree Chandler, NP   09/20/2020    Virtual Visit via Telephone Note  I connected with@ on 09/20/20 at 11:00 AM EST by telephone and verified that I am speaking with the correct person using two identifiers.  Location: Patient: home Provider: [facility]   I discussed the limitations, risks, security and privacy concerns of performing an evaluation and management service by telephone and the availability of in person appointments. I also discussed with the patient that there may be a patient responsible charge related to this service. The patient expressed understanding and agreed to proceed.   I discussed the assessment and treatment plan with the  patient. The patient was provided an opportunity to ask questions and all were answered. The patient agreed with the plan and demonstrated an understanding of the instructions.   The patient was advised to call back or seek an in-person evaluation if the symptoms worsen or if the condition fails to improve as anticipated.  I provided 18 minutes of non-face-to-face time during this encounter.  Carlos American. Harle Battiest Avs printed and mailed

## 2020-09-20 NOTE — Telephone Encounter (Signed)
[name], kubicki are scheduled for a virtual visit with your provider today.    Just as we do with appointments in the office, we must obtain your consent to participate.  Your consent will be active for this visit and any virtual visit you may have with one of our providers in the next 365 days.    If you have a MyChart account, I can also send a copy of this consent to you electronically.  All virtual visits are billed to your insurance company just like a traditional visit in the office.  As this is a virtual visit, video technology does not allow for your provider to perform a traditional examination.  This may limit your provider's ability to fully assess your condition.  If your provider identifies any concerns that need to be evaluated in person or the need to arrange testing such as labs, EKG, etc, we will make arrangements to do so.    Although advances in technology are sophisticated, we cannot ensure that it will always work on either your end or our end.  If the connection with a video visit is poor, we may have to switch to a telephone visit.  With either a video or telephone visit, we are not always able to ensure that we have a secure connection.   I need to obtain your verbal consent now.   Are you willing to proceed with your visit today?   Melissa Simpson has provided verbal consent on 09/20/2020 for a virtual visit (video or telephone).   Carroll Kinds, CMA 09/20/2020  10:46 AM

## 2020-09-22 ENCOUNTER — Other Ambulatory Visit: Payer: Self-pay

## 2020-09-22 ENCOUNTER — Ambulatory Visit (INDEPENDENT_AMBULATORY_CARE_PROVIDER_SITE_OTHER): Payer: Medicare HMO | Admitting: Internal Medicine

## 2020-09-22 ENCOUNTER — Encounter: Payer: Self-pay | Admitting: Internal Medicine

## 2020-09-22 VITALS — BP 118/72 | HR 54 | Temp 96.9°F | Ht 58.08 in | Wt 161.4 lb

## 2020-09-22 DIAGNOSIS — J320 Chronic maxillary sinusitis: Secondary | ICD-10-CM | POA: Diagnosis not present

## 2020-09-22 DIAGNOSIS — Z0000 Encounter for general adult medical examination without abnormal findings: Secondary | ICD-10-CM | POA: Diagnosis not present

## 2020-09-22 DIAGNOSIS — I10 Essential (primary) hypertension: Secondary | ICD-10-CM | POA: Diagnosis not present

## 2020-09-22 DIAGNOSIS — R739 Hyperglycemia, unspecified: Secondary | ICD-10-CM | POA: Diagnosis not present

## 2020-09-22 DIAGNOSIS — E781 Pure hyperglyceridemia: Secondary | ICD-10-CM

## 2020-09-22 DIAGNOSIS — K219 Gastro-esophageal reflux disease without esophagitis: Secondary | ICD-10-CM

## 2020-09-22 NOTE — Patient Instructions (Addendum)
I do recommend the covid booster.  Please try the nettipot for your sinus congestion. You may also use mucinex for the head and throat congestion.  Please follow up with ortho about your knee.

## 2020-09-22 NOTE — Progress Notes (Signed)
Provider:  [name]. [name], D.O., C.M.D. Location:   [facility]   Place of Service:   clinic  Previous PCP: Kermit Balo, DO Patient Care Team: Kermit Balo, DO as PCP - General (Geriatric Medicine)  Extended Emergency Contact Information Primary Emergency Contact: [name],[name] Address: [address]          [place], [place] [address] [name] of [place] Home Phone: [phone] Mobile Phone: [phone] Relation: Spouse   Goals of Care: Advanced Directive information Advanced Directives 09/20/2020  Does Patient Have a Medical Advance Directive? Yes  Type of Estate agent of [place];Living will  Does patient want to make changes to medical advance directive? No - Patient declined  Copy of Healthcare Power of Attorney in Chart? Yes - validated most recent copy scanned in chart (See row information)  Would patient like information on creating a medical advance directive? -      Chief Complaint  Patient presents with  . Annual Exam    Anuual exam and review labs. Patient has a GYN that completes all necessary exams. Patient not sure about booster.     HPI: Patient is a 70 y.o. female seen today for an annual physical exam.  She's worried about her children who have been sick with covid.  She spent her bday making soup and leaving it at their door.    She does not know if she wants to get the booster.  Discussed that getting it will make infection less severe and effects of vaccine are short-lived vs the illness.    Did have her flu shot.    She did have a UTI and all better now.    Sugar average did trend up since last time.  She had a birthday cake.  Has so much mucus in her head that drains and makes her cough a lot.  Sometimes in her throat and sometimes has dry scratchy cough.  Takes the otc allergy med in the spring.  This is chronic all the time.  Has not really do flushing with saline.    Needs to go back to ortho about her left knee  which is bone on bone.  Hips and knees are all painful.  Past Medical History:  Diagnosis Date  . AKI (acute kidney injury) (HCC) 05/23/2018  . Arthritis    in bilateral knees  . Cancer (HCC)    left ovarian cancer  . Family history of colon cancer   . Family history of ovarian cancer   . GERD (gastroesophageal reflux disease)   . History of DVT (deep vein thrombosis) 2019   had a catheter-related DVT during chemotherapy  . Hyperlipidemia   . Hypertension   . 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 ABDOMINAL HYSTERECTOMY    . BIOPSY THYROID  2008  . COLONOSCOPY    . IR CV LINE INJECTION  07/15/2018  . IR REMOVAL TUN ACCESS W/ PORT W/O FL MOD SED  11/24/2018  . TONSILECTOMY, ADENOIDECTOMY, BILATERAL MYRINGOTOMY AND TUBES Bilateral 1957    reports that she has never smoked. She has never used smokeless tobacco. She reports that she does not drink alcohol and does not use drugs.  Functional Status Survey:    Family History  Problem Relation Age of Onset  . Hypertension Father   . Diabetes Father   . Coronary artery disease Father   . Hyperlipidemia Father   . Colon cancer Father  73's  . Hypertension Mother   . Arthritis Mother   . Cancer Maternal Aunt        type unk  . Ovarian cancer Cousin 51  . Cancer Cousin        mouth    Health Maintenance  Topic Date Due  . COVID-19 Vaccine (3 - Pfizer risk 4-dose series) 05/31/2020  . MAMMOGRAM  11/18/2021  . TETANUS/TDAP  06/23/2022  . COLONOSCOPY (Pts 45-20yrs Insurance coverage will need to be confirmed)  11/06/2026  . INFLUENZA VACCINE  Completed  . DEXA SCAN  Completed  . Hepatitis C Screening  Completed  . PNA vac Low Risk Adult  Completed    Allergies  Allergen Reactions  . Pollen Extract     Outpatient Encounter Medications as of 09/22/2020  Medication Sig  . alendronate (FOSAMAX) 70 MG tablet Take one every week with a full glass of water on an empty stomach   . Ascorbic Acid (VITAMIN C PO) Take 600 mg by mouth daily.  . Biotin 10000 MCG TABS Take 1 tablet by mouth daily.  Marland Kitchen BLACK COHOSH PO Take 1 capsule by mouth daily.  . Calcium-Magnesium-Vitamin D (CALCIUM 1200+D3 PO)   . Glucosamine HCl ([employer] GLUCOSAMINE PO) 2 tabs by mouth in the morning and 1 tab by mouth at night  . losartan-hydrochlorothiazide (HYZAAR) 50-12.5 MG tablet Take 1 tablet by mouth daily.  . Melatonin 10 MG CAPS Take 1 tablet by mouth at bedtime.   . metoprolol tartrate (LOPRESSOR) 50 MG tablet Take 1 tablet (50 mg total) by mouth 2 (two) times daily.  . Multiple Vitamin (MULTIVITAMIN) tablet Take 1 tablet by mouth 2 (two) times daily.   . Omega-3 Fatty Acids (OMEGA-3 FISH OIL PO) Take 1 tablet by mouth 2 (two) times daily.  Marland Kitchen omeprazole (PRILOSEC) 20 MG capsule Take 1 capsule (20 mg total) by mouth daily.  . Probiotic Product (PROBIOTIC-10 PO)   . simvastatin (ZOCOR) 20 MG tablet TAKE 1 TABLET EVERY DAY   No facility-administered encounter medications on file as of 09/22/2020.    Review of Systems  Constitutional: Negative for chills, fever and malaise/fatigue.  HENT: Positive for congestion and hearing loss. Negative for sore throat.        Congestion in throat  Eyes: Negative for blurred vision.  Respiratory: Negative for cough and shortness of breath.        Winded more than she used to be but no longer exercising at all  Cardiovascular: Positive for leg swelling. Negative for chest pain and palpitations.  Gastrointestinal: Negative for abdominal pain, blood in stool, constipation and melena.  Genitourinary: Negative for dysuria.  Musculoskeletal: Positive for joint pain. Negative for falls.       Left knee, right hip, shoulders at times  Skin: Negative for itching and rash.  Neurological: Negative for dizziness and loss of consciousness.  Psychiatric/Behavioral: Negative for depression and memory loss. The patient is not nervous/anxious and does not have insomnia.      Vitals:   09/22/20 1011  BP: 118/72  Pulse: (!) 54  Temp: (!) 96.9 F (36.1 C)  SpO2: 96%  Weight: 161 lb 6.4 oz (73.2 kg)  Height: 4' 10.08" (1.475 m)   Body mass index is 33.65 kg/m. Physical Exam Vitals reviewed.  Constitutional:      General: She is not in acute distress.    Appearance: Normal appearance. She is obese. She is not toxic-appearing.  HENT:     Head: Normocephalic and atraumatic.  Right Ear: Tympanic membrane, ear canal and external ear normal.     Left Ear: Tympanic membrane, ear canal and external ear normal.     Nose: Congestion present.     Mouth/Throat:     Pharynx: Oropharynx is clear.  Eyes:     Conjunctiva/sclera: Conjunctivae normal.     Pupils: Pupils are equal, round, and reactive to light.     Comments: glasses  Cardiovascular:     Rate and Rhythm: Normal rate and regular rhythm.  Pulmonary:     Effort: Pulmonary effort is normal.     Breath sounds: Normal breath sounds. No wheezing, rhonchi or rales.  Abdominal:     General: Bowel sounds are normal. There is no distension.     Tenderness: There is no abdominal tenderness. There is no guarding or rebound.  Musculoskeletal:     Cervical back: Neck supple.     Right lower leg: No edema.     Left lower leg: No edema.     Comments: Tender left knee, right hip  Lymphadenopathy:     Cervical: No cervical adenopathy.  Skin:    General: Skin is warm and dry.     Capillary Refill: Capillary refill takes less than 2 seconds.  Neurological:     General: No focal deficit present.     Mental Status: She is alert and oriented to person, place, and time.     Cranial Nerves: No cranial nerve deficit.     Sensory: No sensory deficit.     Motor: No weakness.     Coordination: Coordination normal.     Gait: Gait normal.     Deep Tendon Reflexes: Reflexes normal.  Psychiatric:        Mood and Affect: Mood normal.        Behavior: Behavior normal.        Thought Content: Thought content  normal.        Judgment: Judgment normal.     Labs reviewed: Basic Metabolic Panel: Recent Labs    03/17/20 0936 09/19/20 0936  NA 140 139  K 3.9 3.9  CL 107 103  CO2 27 28  GLUCOSE 114* 110*  BUN 20 19  CREATININE 1.01* 1.08*  CALCIUM 9.7 9.5   Liver Function Tests: Recent Labs    03/17/20 0936 09/19/20 0936  AST 22 20  ALT 24 22  BILITOT 0.9 0.8  PROT 7.0 6.8   No results for input(s): LIPASE, AMYLASE in the last 8760 hours. No results for input(s): AMMONIA in the last 8760 hours. CBC: Recent Labs    03/17/20 0936  WBC 5.3  NEUTROABS 3,281  HGB 14.5  HCT 42.2  MCV 100.0  PLT 272   Cardiac Enzymes: No results for input(s): CKTOTAL, CKMB, CKMBINDEX, TROPONINI in the last 8760 hours. BNP: Invalid input(s): POCBNP Lab Results  Component Value Date   HGBA1C 6.1 (H) 09/19/2020   Lab Results  Component Value Date   TSH 3.030 06/02/2015   No results found for: VITAMINB12 No results found for: FOLATE No results found for: IRON, TIBC, FERRITIN   Assessment/Plan 1. Annual physical exam -performed today, gets breast and pelvic exam with gyn due to h/o ovarian ca--has CA 125 coming up with oncology  2. Chronic maxillary sinusitis -worse lately -nettipot and mucinex suggested -suspect she may have had covid exposure also  3. Essential hypertension, benign -bp at goal with current, cont same and monitor  4. Gastroesophageal reflux disease, unspecified whether esophagitis present -cont  prilosec   5. Pure hypertriglyceridemia -cont zocor therapy, needs to be moving more but too much joint pain to walk like she did--counseled to see ortho and get further tx  6. Hyperglycemia -hba1c up which indicates too many sweets and starches over past 3 mos and not helped by bday cake   Labs/tests ordered:  Cbc, cmp, flp, hba1c needed before annual next Jan  Melissa Simpson L. Jumar Greenstreet, D.O. [facility] [address], [place] [address] Cell Phone (Mon-Fri 8am-5pm):  [phone] On Call:  [phone] & follow prompts after 5pm & weekends Office Phone:  [phone] Office Fax:  [phone]

## 2020-10-25 ENCOUNTER — Other Ambulatory Visit: Payer: Self-pay

## 2020-10-25 ENCOUNTER — Inpatient Hospital Stay (HOSPITAL_BASED_OUTPATIENT_CLINIC_OR_DEPARTMENT_OTHER): Payer: Medicare HMO | Admitting: Gynecologic Oncology

## 2020-10-25 ENCOUNTER — Encounter: Payer: Self-pay | Admitting: Gynecologic Oncology

## 2020-10-25 ENCOUNTER — Inpatient Hospital Stay: Payer: Medicare HMO | Attending: Gynecologic Oncology

## 2020-10-25 VITALS — BP 141/73 | HR 54 | Temp 97.6°F | Resp 16 | Ht <= 58 in | Wt 159.0 lb

## 2020-10-25 DIAGNOSIS — Z90722 Acquired absence of ovaries, bilateral: Secondary | ICD-10-CM

## 2020-10-25 DIAGNOSIS — Z9221 Personal history of antineoplastic chemotherapy: Secondary | ICD-10-CM | POA: Insufficient documentation

## 2020-10-25 DIAGNOSIS — E785 Hyperlipidemia, unspecified: Secondary | ICD-10-CM | POA: Diagnosis not present

## 2020-10-25 DIAGNOSIS — Z8543 Personal history of malignant neoplasm of ovary: Secondary | ICD-10-CM | POA: Insufficient documentation

## 2020-10-25 DIAGNOSIS — Z90710 Acquired absence of both cervix and uterus: Secondary | ICD-10-CM | POA: Diagnosis not present

## 2020-10-25 DIAGNOSIS — Z86718 Personal history of other venous thrombosis and embolism: Secondary | ICD-10-CM | POA: Insufficient documentation

## 2020-10-25 DIAGNOSIS — C562 Malignant neoplasm of left ovary: Secondary | ICD-10-CM

## 2020-10-25 DIAGNOSIS — M1990 Unspecified osteoarthritis, unspecified site: Secondary | ICD-10-CM | POA: Diagnosis not present

## 2020-10-25 DIAGNOSIS — Z79899 Other long term (current) drug therapy: Secondary | ICD-10-CM | POA: Insufficient documentation

## 2020-10-25 DIAGNOSIS — Z08 Encounter for follow-up examination after completed treatment for malignant neoplasm: Secondary | ICD-10-CM | POA: Diagnosis not present

## 2020-10-25 DIAGNOSIS — I10 Essential (primary) hypertension: Secondary | ICD-10-CM | POA: Diagnosis not present

## 2020-10-25 DIAGNOSIS — K219 Gastro-esophageal reflux disease without esophagitis: Secondary | ICD-10-CM | POA: Insufficient documentation

## 2020-10-25 NOTE — Patient Instructions (Addendum)
Your exam is normal today.  No evidence of cancer.  I will call or have someone from the office call tomorrow with your Ca1 25 results.  If you develop any new symptoms before your next visit with me, please call the clinic to be seen sooner at [phone].  Please call in June to get a 35-month follow-up scheduled with me for mid August.

## 2020-10-25 NOTE — Progress Notes (Signed)
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 Clear cell Positive for BRCA-1 on tumor block, negative blood test   Left ovarian epithelial cancer ([place])  03/17/2018 Initial Diagnosis   Presented to see primary doctor for weight loss and abdominal discomfort   04/29/2018 Imaging   1. Large pelvic mass with heterogeneous appearance, measuring at least 15 centimeters in diameter. Although the findings favor enlarged uterus/fibroids, ovaries are not well seen and an ovarian mass should also be considered. Further characterization with pelvic ultrasound is recommended. 2. The bladder is completely decompressed. There is LEFT-sided hydronephrosis secondary to extrinsic compression from the pelvic mass. 3. Irregular thickening of the sigmoid colon, suspicious for colonic wall mass, measuring 1.3 centimeters. In this patient with a family history of colon cancer, further evaluation with colonoscopy is recommended. 4. Colonic diverticula without acute diverticulitis. 5. Moderate hiatal hernia.    05/15/2018 Imaging   MRI pelvis 16 cm complex cystic and solid mass which is centered in the left posterior adnexa and is contiguous with the uterus. This is suspicious for ovarian carcinoma, with differential diagnosis of atypical pedunculated fibroid with peripheral cystic degeneration. Surgical evaluation is recommended.  Other uterine fibroids measuring up to 4.8 cm.  No evidence of pelvic metastatic disease.    05/16/2018 Imaging   US pelvis 1. Large pelvic mass described in detail on MRI examination 1 day prior. 2. Slight ureteral prominence bilaterally. Mild fullness of the left renal collecting system. No obstructing focus evident. Suspect fullness of these structures due to ureteral  compression by the sizable pelvic mass.     05/22/2018 Tumor Marker   Patient's tumor was tested for the following markers: CA-125 Results of the tumor marker test revealed 122   05/27/2018 Surgery   Preoperative Diagnoses: Pelvic mass  Postoperative Diagnoses: left fallopian tube and ovary consistent with high-grade carcinoma of likely GYN origin. No evidence of metastatic disease.  Procedures: Exploratory laparotomy, total abdominal hysterectomy, bilateral salpingo-oophorectomy, peritoneal biopsies for ovarian cancer staging, bilateral pelvic lymphadenectomy, bilateral para-aortic lymphadenectomy, infracolic omentectomy, R0 resection   Surgeon: Marti Sleigh, MD  Assistants: Tona Sensing, MD - Fellow, Haroldine Laws, MD - Resident, Feliberto Harts, PA-S  Findings: On BME, the cervix is retracted anteriorly with no palpable adnexal fullness. There is no RV septum nodularity. Abdominal exam reveals a mobile large mass that extends to the umbilicus and nearly to the pelvic side walls. On abdominal entry, there was immediate green-brown pelvic fluid that appeared consistent with a spontaneous rupture of the cystic fluid-filled mass. The left ovary was a large cystic complex mass, roughly 15cm in size with smooth surface but containing friable tumor. The mass was densely adherent along the entire posterior wall of the uterus. The uterus had some small intramural fibroids and was roughly 10cm in total size. The right fallopian tube and ovary were normal appearing. The patient has smooth paracolic gutters, liver edge, diaphragm, spleen and stomach. The omentum was retracted but no evidence of nodularity or gross tumor. The anterior cul-de-sac was clear and the posterior-cul-de-sac peritoneum was clear, but the left ovarian mass wall was adherent to the rectosigmoid and cul-de-sac. There were no palpable pelvic or para-aortic adenopathy.   Of note, an the conclusion of the case, there was mild  RIGHT hydronephrosis noted. The ureter was traced  from the level of the right common iliac into the anterior utero-vesical ligament. There was an area at the pelvic brim were the hydronephrosis resolved to normal caliber. There was no tethering, stricture, or apparent injury to the ureter. Based on exam, this seemed consistent with packing and retraction causing some transient dilation that resolved with packing and retractor removal.   Intraoperative frozen section of the left ovary was consistent with high-grade carcinoma of GYN origin, possibly serous. There was no gross evidence of disease at the conclusion of the case, R0 resection.  Specimens: 1) Pelvic washings 2) Uterus with cervix  3) Left ovary and fallopian tube, IOFS c/w high-grade GYN carcinoma 4) Right fallopian tube and ovary 5) Peritoneal biopsies - anterior cul-de-sac peritoneum, posterior cul-de-sac peritoneum, left pelvic side wall peritoneum, right pelvic side wall peritoneum 6) Right and left pelvic lymph nodes 7) Right and left para-aortic lymph nodes 8) Omentum 9) Anaerobic/Aerobic culture - preliminary read is no organisms    05/27/2018 Pathology Results   A: Ovary and fallopian tube, left, salpingo-oophorectomy - Clear cell carcinoma of left ovary, size 14.0 cm, with necrosis  - Carcinoma is adherent to fallopian tube, consistent with surface involvement (stage pT1c2) - Focal endometriosis present - See synoptic report and comment  B: Uterus with cervix and right ovary and fallopian tube, hysterectomy and right salpingo-oophorectomy Cervix: Benign with Nabothian cysts Endometrium: Endometrial polyp (size 3.5 cm); inactive endometrium with cystic glandular changes Myometrium: Leiomyomata with hyalinization and calcification (size up to 4.2 cm); focal adenomyosis  Right ovary: Benign physiologic changes Right fallopian tube: Small paratubal cyst and no malignancy identified  C: Lymph nodes, right pelvic, regional  resection - Nine lymph nodes with no metastatic carcinoma identified (0/9)  D: Lymph nodes, left pelvic, regional resection - Five lymph nodes with no metastatic carcinoma identified (0/5)  E: Cul-de-sac, anterior, biopsy - Fibroadipose tissue with no carcinoma identified  F: Cul-de-sac, posterior, biopsy - Fibrous tissue with inflammation and no carcinoma identified  G: Pelvic sidewall, right, biopsy - Fibroadipose tissue with focal crushed CD10 positive cells, cannot exclude endometrial type stroma - No carcinoma identified  H: Pelvic sidewall, left, biopsy - Fibroadipose tissue with focal crushed cells, cannot exclude endometrial type stroma - No carcinoma identified  I: Omentum, omentectomy - Adipose and fibrovascular tissue consistent with omentum - No carcinoma identified  J: Lymph nodes, left periaortic, regional resection - Two small lymph nodes with no metastatic carcinoma identified (0/2)  K: Lymph nodes, right periaortic, regional resection - Three small lymph nodes with no metastatic carcinoma identified (0/3)  This electronic signature is attestation that the pathologist personally reviewed the submitted material(s) and the final diagnosis reflects that evaluation.    Synoptic Report     OVARY or FALLOPIAN TUBE or PRIMARY PERITONEUM(Ovary FT Perit - All Specimens)    SPECIMEN  Procedure:Total hysterectomy and bilateral salpingo-oophorectomy   Procedure:Omentectomy   Procedure:Peritonealbiopsies   Procedure:Peritoneal washing   :  Specimen Integrity of Left Ovary:Received largely intact with multiple areas of disruption   Tumor Site:Left ovary   Histologic Type:Clear cell carcinoma   Histologic Grade:High grade   :  Tumor Size:Greatest dimension in Centimeters (cm): 14.0 Centimeters (cm)  Additional Dimension in Centimeters (cm):10 Centimeters (cm)  Additional Dimension in  Centimeters (cm):7 Centimeters (cm)  Tumor Extent:  Ovarian Surface Involvement:Present   Laterality:Left   Other Tissue / Organ Involvement:Not identified   Peritoneal Ascitic Fluid:Negative for malignancy (normal / benign)   Pleural Fluid:Not submitted / unknown  Accessory Findings:  LYMPH NODES  Regional Lymph Nodes:All lymph nodes negative for tumor cells   Number of Lymph Nodes Examined:19   Site(s):Right pelvic: 9   Site(s):Left pelvic: 5   Site(s):Right para-aortic: 3   Site(s):Left para-aortic: 2   PATHOLOGIC STAGE CLASSIFICATION (pTNM, AJCC 8th Edition)  Primary Tumor (pT):pT1c2   Regional Lymph Nodes (pN):pN0   FIGO STAGE  FIGO Stage:IC2   ADDITIONAL FINDINGS  Additional Pathologic Findings:Endometriosis   Additional Pathologic Findings:Benign fallopian tube with adherence to carcinoma; also see additional parts   Comment(s)  Comment(s):Also see diagnosis comment.   The frozen section diagnosis is confirmed for specimen A. Sections demonstrate a carcinoma with tubulocystic and papillary architecture with hyalinized cores, lined by cells with high grade nuclei, clear to eosinophilic cytoplasm, and nuclear hobnailing.  The morphologic features are most suggestive of clear cell carcinoma.  Immunohistochemical stains are performed, and demonstrate that the carcinoma is positive for CK7 and PAX8, consistent with gynecologic origin. It has patchy positivity for Napsin A, wild type p53 staining, and is negative for ER, supporting the diagnosis of clear cell carcinoma.   Sections of the tumor demonstrate carcinoma with adherence to fallopian tube surface, consistent with surface involvement.  Per the operative note, findings were also suggestive of preoperative rupture of the mass.  Given both these findings, the tumor is staged as a pT1c2 (FIGO IC2)            05/27/2018  Genetic Testing   Patient has genetic testing done for genetic disorder Results revealed patient has the following mutation(s): BRCA 1 on tissue block, neg blood   06/04/2018 Procedure   She had port placement   06/05/2018 Cancer Staging   Staging form: Ovary, Fallopian Tube, and Primary Peritoneal Carcinoma, AJCC 8th Edition - Pathologic: FIGO Stage IC2, calculated as Stage IC (pT1c2, pN0, cM0) - Signed by Heath Lark, MD on 06/05/2018   06/27/2018 Imaging   1. No evidence of residual/recurrent tumor in the pelvis. 2. No evidence of metastatic disease in the chest, abdomen or pelvis. 3. Left pelvic sidewall 6.3 x 5.0 cm simple fluid collection, compatible with postsurgical seroma or lymphocele. 4. Small hiatal hernia. 5. Aortic Atherosclerosis (ICD10-I70.0).   06/27/2018 Tumor Marker   Patient's tumor was tested for the following markers: CA-125 Results of the tumor marker test revealed 90.3   07/01/2018 - 10/14/2018 Chemotherapy   The patient had palonosetron (ALOXI) injection 0.25 mg, 0.25 mg, Intravenous,  Once, 6 of 6 cycles  Administration: 0.25 mg (07/01/2018), 0.25 mg (07/22/2018), 0.25 mg (08/12/2018), 0.25 mg (09/02/2018), 0.25 mg (09/23/2018), 0.25 mg (10/14/2018)    CARBOplatin (PARAPLATIN) 420 mg in sodium chloride 0.9 % 250 mL chemo infusion, 420 mg (98.3 % of original dose 425.4 mg), Intravenous,  Once, 6 of 6 cycles  Dose modification:   (original dose 425.4 mg, Cycle 1), 380 mg (original dose 425.4 mg, Cycle 5, Reason: Dose not tolerated), 380 mg (original dose 425.4 mg, Cycle 6, Reason: Dose not tolerated)  Administration: 420 mg (07/01/2018), 440 mg (07/22/2018), 380 mg (08/12/2018), 440 mg (09/02/2018), 380 mg (09/23/2018), 380 mg (10/14/2018)    PACLitaxel (TAXOL) 270 mg in sodium chloride 0.9 % 250 mL chemo infusion (> 50m/m2), 175 mg/m2 = 270 mg, Intravenous,  Once, 6 of 6 cycles  Administration: 270 mg (07/01/2018), 270 mg (07/22/2018), 270 mg (08/12/2018), 270 mg  (09/02/2018), 270 mg (09/23/2018), 270 mg (10/14/2018)    fosaprepitant (EMEND) 150 mg, dexamethasone (DECADRON) 12 mg in sodium chloride 0.9 %  145 mL IVPB, , Intravenous,  Once, 6 of 6 cycles  Administration:  (07/01/2018),  (07/22/2018),  (08/12/2018),  (09/02/2018),  (09/23/2018),  (10/14/2018)  for chemotherapy treatment.     07/15/2018 Imaging   Vascular US Findings consistent with acute deep vein thrombosis involving the right internal jugular veins. She is placed on Xarelto   07/21/2018 Tumor Marker   Patient's tumor was tested for the following markers: CA-125 Results of the tumor marker test revealed 22.8   09/22/2018 Tumor Marker   Patient's tumor was tested for the following markers: CA-125 Results of the tumor marker test revealed 14.5   11/10/2018 Imaging   Status post hysterectomy and bilateral salpingo-oophorectomy.  4.9 cm improving postoperative seroma along the left pelvic sidewall.  No evidence of recurrent or metastatic disease.    11/10/2018 Tumor Marker   Patient's tumor was tested for the following markers: CA-125 Results of the tumor marker test revealed 12.6   11/24/2018 Procedure   Successful removal of implanted Port-A-Cath.     Interval History: Doing well since her last visit.  Spent holiday with her family.  Has continued to travel to their house in the [facility] with her husband.  Her sister and husband are looking for houses in the same area.  They are currently located in [place].  She endorses a good appetite without any nausea or emesis. She reports normal bowel and bladder function. She denies any abdominal pain, pelvic pain, vaginal bleeding, or discharge.  Strained her right knee at the end of last week on some stairs.  It is feeling better and almost back to baseline.  She is thinking about seeing her orthopedic doctor again to talk about other nonsurgical options for her knees.  Past Medical/Surgical History: Past Medical  History:  Diagnosis Date  . AKI (acute kidney injury) ([place]) 05/23/2018  . Arthritis    in bilateral knees  . Cancer ([place])    left ovarian cancer  . Family history of colon cancer   . Family history of ovarian cancer   . GERD (gastroesophageal reflux disease)   . History of DVT (deep vein thrombosis) 2019   had a catheter-related DVT during chemotherapy  . Hyperlipidemia   . Hypertension   . 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 ABDOMINAL HYSTERECTOMY    . BIOPSY THYROID  2008  . COLONOSCOPY    . IR CV LINE INJECTION  07/15/2018  . IR REMOVAL TUN ACCESS W/ PORT W/O FL MOD SED  11/24/2018  . TONSILECTOMY, ADENOIDECTOMY, BILATERAL MYRINGOTOMY AND TUBES Bilateral 1957    Family History  Problem Relation Age of Onset  . Hypertension Father   . Diabetes Father   . Coronary artery disease Father   . Hyperlipidemia Father   . Colon cancer Father        46's  . Hypertension Mother   . Arthritis Mother   . Cancer Maternal Aunt        type unk  . Ovarian cancer Cousin 68  . Cancer Cousin        mouth    Social History   Socioeconomic History  . Marital status: Married    Spouse name: Melissa Simpson  . Number of children: 3  . Years of education: Not on file  . Highest education level: Not on file  Occupational History  . Occupation: retired Solicitor  Tobacco Use  . Smoking status: Never Smoker  . Smokeless tobacco:  Never Used  Vaping Use  . Vaping Use: Never used  Substance and Sexual Activity  . Alcohol use: No  . Drug use: No  . Sexual activity: Not on file  Other Topics Concern  . Not on file  Social History Narrative   Diet:Unrestricted   Do you drink/eat things with caffeine? Rarely   Marital status: Married                             What year were you married? 1992   Do you live in a house, apartment, assisted living, condo, trailer, etc)? House   Is it one or more stories? 1   How many persons live in your  home? 3   Do you have any pets in your home?  2 small dogs   Current or past profession: Chief of Staff   Do you exercise?                                                     Type & how often:   Do you have a living will? No   Do you have a DNR Form? No   Do you have a POA/HPOA forms? NO   Social Determinants of Radio broadcast assistant Strain: Not on file  Food Insecurity: Not on file  Transportation Needs: Not on file  Physical Activity: Not on file  Stress: Not on file  Social Connections: Not on file    Current Medications:  Current Outpatient Medications:  .  alendronate (FOSAMAX) 70 MG tablet, Take one every week with a full glass of water on an empty stomach, Disp: 12 tablet, Rfl: 3 .  Ascorbic Acid (VITAMIN C PO), Take 600 mg by mouth daily., Disp: , Rfl:  .  Biotin 10000 MCG TABS, Take 1 tablet by mouth daily., Disp: , Rfl:  .  BLACK COHOSH PO, Take 1 capsule by mouth daily., Disp: , Rfl:  .  Calcium-Magnesium-Vitamin D (CALCIUM 1200+D3 PO), , Disp: , Rfl:  .  Glucosamine HCl ([employer] GLUCOSAMINE PO), 2 tabs by mouth in the morning and 1 tab by mouth at night, Disp: , Rfl:  .  losartan-hydrochlorothiazide (HYZAAR) 50-12.5 MG tablet, Take 1 tablet by mouth daily., Disp: 90 tablet, Rfl: 1 .  Melatonin 10 MG CAPS, Take 1 tablet by mouth at bedtime. , Disp: , Rfl:  .  metoprolol tartrate (LOPRESSOR) 50 MG tablet, Take 1 tablet (50 mg total) by mouth 2 (two) times daily., Disp: 180 tablet, Rfl: 1 .  Multiple Vitamin (MULTIVITAMIN) tablet, Take 1 tablet by mouth 2 (two) times daily. , Disp: , Rfl:  .  Omega-3 Fatty Acids (OMEGA-3 FISH OIL PO), Take 1 tablet by mouth 2 (two) times daily., Disp: , Rfl:  .  omeprazole (PRILOSEC) 20 MG capsule, Take 1 capsule (20 mg total) by mouth daily., Disp: 90 capsule, Rfl: 1 .  Probiotic Product (PROBIOTIC-10 PO), , Disp: , Rfl:  .  simvastatin (ZOCOR) 20 MG tablet, TAKE 1 TABLET EVERY DAY, Disp: 90 tablet, Rfl: 1  Review of  Systems: Denies appetite changes, fevers, chills, fatigue, unexplained weight changes. Denies hearing loss, neck lumps or masses, mouth sores, ringing in ears or voice changes. Denies cough or wheezing.  Denies shortness of breath. Denies chest pain or  palpitations. Denies leg swelling. Denies abdominal distention, pain, blood in stools, constipation, diarrhea, nausea, vomiting, or early satiety. Denies pain with intercourse, dysuria, frequency, hematuria or incontinence. Denies hot flashes, pelvic pain, vaginal bleeding or vaginal discharge.   Denies joint pain, back pain or muscle pain/cramps. Denies itching, rash, or wounds. Denies dizziness, headaches, numbness or seizures. Denies swollen lymph nodes or glands, denies easy bruising or bleeding. Denies anxiety, depression, confusion, or decreased concentration.  Physical Exam: BP (!) 141/73 (BP Location: Left Arm, Patient Position: Sitting)   Pulse (!) 54   Temp 97.6 F (36.4 C) (Tympanic)   Resp 16   Ht '4\' 10"'  (1.473 m)   Wt 159 lb (72.1 kg)   SpO2 97% Comment: RA  BMI 33.23 kg/m  General: Alert, oriented, no acute distress. HEENT: Normocephalic, atraumatic, sclera anicteric. Chest: Unlabored breathing on room air. Abdomen: Obese, soft, nontender.  Normoactive bowel sounds.  No masses or hepatosplenomegaly appreciated.  Well-healed scar. Extremities: Grossly normal range of motion.  Warm, well perfused.  No edema bilaterally. Skin: No rashes or lesions noted. Lymphatics: No cervical, supraclavicular, or inguinal adenopathy. GU: Normal appearing external genitalia without erythema, excoriation, or lesions.  Speculum exam reveals mildly atrophic vaginal mucosa, no lesions, bleeding or discharge.  Bimanual exam reveals cuff intact, no nodularity or masses.  Rectovaginal exam confirms these findings.  Laboratory & Radiologic Studies: Component Ref Range & Units 3 mo ago  (07/26/20) 6 mo ago  (04/20/20) 9 mo ago  (01/26/20) 1 yr  ago  (07/23/19) 1 yr ago  (02/13/19) 1 yr ago  (11/10/18) 2 yr ago  (10/13/18)  Cancer Antigen (CA) 125 0.0 - 38.1 U/mL 12.6  15.4 CM  13.8 CM  10.8 CM  11.8 CM  12.6 CM  14.2 CM   CA-125 pending from today  Assessment & Plan: Melissa Simpson is a 70 y.o. woman with Stage IC2status post staging surgery in 05/2018 and completion of adjuvant chemotherapy in 2/2020who presents forsurveillance.  Patient is NED on exam today.  She is doing well without any significant symptoms.  Her Ca1 25 will result tomorrow we will contact her with these results.  PerNCCN and SGO surveillance guidelines,we discussed transitioning to surveillance visits every 4-6 months since she is now 2 years out from completion of adjuvant therapy.Her preference is to see me every 6 months.  She will call in the early summer to schedule a visit for August.  Discussed signs and symptoms that are concerning for recurrent disease and should prompt a call to be seen sooner than her next scheduled visit.  28 minutes of total time was spent for this patient encounter, including preparation, face-to-face counseling with the patient and coordination of care, and documentation of the encounter.  Jeral Pinch, MD  Division of Gynecologic Oncology  Department of Obstetrics and Gynecology  [facility]

## 2020-10-26 ENCOUNTER — Telehealth: Payer: Self-pay

## 2020-10-26 LAB — CA 125: Cancer Antigen (CA) 125: 14.2 U/mL (ref 0.0–38.1)

## 2020-10-26 NOTE — Telephone Encounter (Signed)
-----   Message from Lafonda Mosses, MD sent at 10/26/2020  8:50 AM EST ----- Melissa Simpson,  Could you please call Mrs. Blauvelt with her CA-125 from yesterday? Thanks! Wendelyn Breslow  ----- Message ----- From: Buel Ream, Lab In [place] Sent: 10/26/2020   8:37 AM EST To: Lafonda Mosses, MD

## 2020-10-26 NOTE — Telephone Encounter (Signed)
LM for Melissa Simpson stating that  The CA-125 was WNL at 14.2. She can call the office at [phone] if she has any questions or concerns.

## 2020-10-31 ENCOUNTER — Encounter: Payer: Self-pay | Admitting: Internal Medicine

## 2020-11-01 ENCOUNTER — Other Ambulatory Visit: Payer: Self-pay | Admitting: *Deleted

## 2020-11-01 DIAGNOSIS — I10 Essential (primary) hypertension: Secondary | ICD-10-CM

## 2020-11-01 MED ORDER — METOPROLOL TARTRATE 50 MG PO TABS: 50.0000 mg | ORAL_TABLET | Freq: Two times a day (BID) | ORAL | 1 refills | Status: DC

## 2020-11-01 NOTE — Telephone Encounter (Signed)
Patient requested refill sent to [facility]

## 2020-11-24 ENCOUNTER — Other Ambulatory Visit: Payer: Self-pay | Admitting: Internal Medicine

## 2020-11-24 DIAGNOSIS — K219 Gastro-esophageal reflux disease without esophagitis: Secondary | ICD-10-CM

## 2020-11-24 DIAGNOSIS — I10 Essential (primary) hypertension: Secondary | ICD-10-CM

## 2020-12-27 DIAGNOSIS — Z1231 Encounter for screening mammogram for malignant neoplasm of breast: Secondary | ICD-10-CM | POA: Diagnosis not present

## 2020-12-27 LAB — HM MAMMOGRAPHY

## 2020-12-29 ENCOUNTER — Encounter: Payer: Self-pay | Admitting: *Deleted

## 2021-03-03 ENCOUNTER — Telehealth: Payer: Self-pay | Admitting: *Deleted

## 2021-03-03 NOTE — Telephone Encounter (Signed)
Patient called and scheduled a lab/follow up appt for 8/17

## 2021-04-13 ENCOUNTER — Other Ambulatory Visit: Payer: Self-pay | Admitting: *Deleted

## 2021-04-13 DIAGNOSIS — E781 Pure hyperglyceridemia: Secondary | ICD-10-CM

## 2021-04-13 DIAGNOSIS — I10 Essential (primary) hypertension: Secondary | ICD-10-CM

## 2021-04-13 DIAGNOSIS — K219 Gastro-esophageal reflux disease without esophagitis: Secondary | ICD-10-CM

## 2021-04-13 MED ORDER — ALENDRONATE SODIUM 70 MG PO TABS: ORAL_TABLET | ORAL | 1 refills | Status: DC

## 2021-04-13 MED ORDER — METOPROLOL TARTRATE 50 MG PO TABS
50.0000 mg | ORAL_TABLET | Freq: Two times a day (BID) | ORAL | 1 refills | Status: DC
Start: 2021-04-13 — End: 2021-09-07

## 2021-04-13 MED ORDER — SIMVASTATIN 20 MG PO TABS: 20.0000 mg | ORAL_TABLET | Freq: Every day | ORAL | 1 refills | Status: DC

## 2021-04-13 MED ORDER — LOSARTAN POTASSIUM-HCTZ 50-12.5 MG PO TABS: 1.0000 | ORAL_TABLET | Freq: Every day | ORAL | 1 refills | Status: DC

## 2021-04-13 MED ORDER — OMEPRAZOLE 20 MG PO CPDR: 20.0000 mg | DELAYED_RELEASE_CAPSULE | Freq: Every day | ORAL | 1 refills | Status: DC

## 2021-04-13 NOTE — Telephone Encounter (Signed)
Patient called requesting refill on her medications. Stated that she was a former patient of Dr. Cyndi Lennert but has an appointment scheduled with use to follow up at our office.

## 2021-04-25 ENCOUNTER — Other Ambulatory Visit: Payer: Self-pay | Admitting: Gynecologic Oncology

## 2021-04-25 DIAGNOSIS — C562 Malignant neoplasm of left ovary: Secondary | ICD-10-CM

## 2021-04-26 ENCOUNTER — Inpatient Hospital Stay: Payer: Medicare HMO

## 2021-04-26 ENCOUNTER — Other Ambulatory Visit: Payer: Self-pay

## 2021-04-26 ENCOUNTER — Inpatient Hospital Stay: Payer: Medicare HMO | Attending: Gynecologic Oncology | Admitting: Gynecologic Oncology

## 2021-04-26 ENCOUNTER — Encounter: Payer: Self-pay | Admitting: Gynecologic Oncology

## 2021-04-26 VITALS — BP 139/72 | HR 55 | Temp 98.1°F | Resp 18 | Ht <= 58 in | Wt 160.8 lb

## 2021-04-26 DIAGNOSIS — Z9221 Personal history of antineoplastic chemotherapy: Secondary | ICD-10-CM | POA: Insufficient documentation

## 2021-04-26 DIAGNOSIS — Z86718 Personal history of other venous thrombosis and embolism: Secondary | ICD-10-CM | POA: Diagnosis not present

## 2021-04-26 DIAGNOSIS — E785 Hyperlipidemia, unspecified: Secondary | ICD-10-CM | POA: Insufficient documentation

## 2021-04-26 DIAGNOSIS — I10 Essential (primary) hypertension: Secondary | ICD-10-CM | POA: Insufficient documentation

## 2021-04-26 DIAGNOSIS — Z90710 Acquired absence of both cervix and uterus: Secondary | ICD-10-CM | POA: Diagnosis not present

## 2021-04-26 DIAGNOSIS — Z148 Genetic carrier of other disease: Secondary | ICD-10-CM | POA: Diagnosis not present

## 2021-04-26 DIAGNOSIS — C562 Malignant neoplasm of left ovary: Secondary | ICD-10-CM

## 2021-04-26 DIAGNOSIS — Z79899 Other long term (current) drug therapy: Secondary | ICD-10-CM | POA: Diagnosis not present

## 2021-04-26 DIAGNOSIS — Z8543 Personal history of malignant neoplasm of ovary: Secondary | ICD-10-CM | POA: Insufficient documentation

## 2021-04-26 DIAGNOSIS — Z90722 Acquired absence of ovaries, bilateral: Secondary | ICD-10-CM | POA: Insufficient documentation

## 2021-04-26 NOTE — Patient Instructions (Signed)
Was good to see you today!  I do not see or feel any evidence of cancer recurrence on your exam.  We will plan to continue with visits every 6 months as we had previously discussed.  If you develop any new or concerning symptoms before your next visit with me, please call to come in sooner.  I will release your CA-125 to you tomorrow when I get it.  My schedule is not out past December.  If you have not heard from [place] by the beginning of January or February, please call the clinic at [phone] to schedule a visit to see me in February.

## 2021-04-26 NOTE — Progress Notes (Signed)
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 Large pelvic mass with heterogeneous appearance, measuring at least 15 centimeters in diameter. Although the findings favor enlarged uterus/fibroids, ovaries are not well seen and an ovarian mass should also be considered. Further characterization with pelvic ultrasound is recommended. 2. The bladder is completely decompressed. There is LEFT-sided hydronephrosis secondary to extrinsic compression from the pelvic mass. 3. Irregular thickening of the sigmoid colon, suspicious for colonic wall mass, measuring 1.3 centimeters. In this patient with a family history of colon cancer, further evaluation with colonoscopy is recommended. 4. Colonic diverticula without acute diverticulitis. 5. Moderate hiatal hernia.     05/15/2018 Imaging   MRI pelvis 16 cm complex cystic and solid mass which is centered in the left posterior adnexa and is contiguous with the uterus. This is suspicious for ovarian carcinoma, with differential diagnosis of atypical pedunculated fibroid with peripheral cystic degeneration. Surgical evaluation is recommended.   Other uterine fibroids measuring up to 4.8 cm.   No evidence of pelvic metastatic disease.     05/16/2018 Imaging   US pelvis 1. Large pelvic mass described in detail on MRI examination 1 day prior. 2. Slight ureteral prominence bilaterally. Mild fullness of the left renal collecting system. No obstructing focus evident. Suspect fullness of these structures due to ureteral  compression by the sizable pelvic mass.     05/22/2018 Tumor Marker   Patient's tumor was tested for the following markers: CA-125 Results of the tumor marker test revealed 122   05/27/2018 Surgery   Preoperative Diagnoses: Pelvic mass  Postoperative Diagnoses: left fallopian tube and ovary consistent with high-grade carcinoma of likely GYN origin. No evidence of metastatic disease.  Procedures: Exploratory laparotomy, total abdominal hysterectomy, bilateral salpingo-oophorectomy, peritoneal biopsies for ovarian cancer staging, bilateral pelvic lymphadenectomy, bilateral para-aortic lymphadenectomy, infracolic omentectomy, R0 resection   Surgeon: Marti Sleigh, MD  Assistants: Tona Sensing, MD - Fellow, Haroldine Laws, MD - Resident, Feliberto Harts, PA-S  Findings: On BME, the cervix is retracted anteriorly with no palpable adnexal fullness. There is no RV septum nodularity. Abdominal exam reveals a mobile large mass that extends to the umbilicus and nearly to the pelvic side walls. On abdominal entry, there was immediate green-brown pelvic fluid that appeared consistent with a spontaneous rupture of the cystic fluid-filled mass. The left ovary was a large cystic complex mass, roughly 15cm in size with smooth surface but containing friable tumor. The mass was densely adherent along the entire posterior wall of the uterus. The uterus had some small intramural fibroids and was roughly 10cm in total size. The right fallopian tube and ovary were normal appearing. The patient has smooth paracolic gutters, liver edge, diaphragm, spleen and stomach. The omentum was retracted but no evidence of nodularity or gross tumor. The anterior cul-de-sac was clear and the posterior-cul-de-sac peritoneum was clear, but the left ovarian mass wall was adherent to the rectosigmoid and cul-de-sac. There were no palpable pelvic or para-aortic adenopathy.   Of note, an the conclusion of the case, there was mild  RIGHT hydronephrosis  noted. The ureter was traced from the level of the right common iliac into the anterior utero-vesical ligament. There was an area at the pelvic brim were the hydronephrosis resolved to normal caliber. There was no tethering, stricture, or apparent injury to the ureter. Based on exam, this seemed consistent with packing and retraction causing some transient dilation that resolved with packing and retractor removal.   Intraoperative frozen section of the left ovary was consistent with high-grade carcinoma of GYN origin, possibly serous. There was no gross evidence of disease at the conclusion of the case, R0 resection.  Specimens: 1) Pelvic washings 2) Uterus with cervix  3) Left ovary and fallopian tube, IOFS c/w high-grade GYN carcinoma 4) Right fallopian tube and ovary 5) Peritoneal biopsies - anterior cul-de-sac peritoneum, posterior cul-de-sac peritoneum, left pelvic side wall peritoneum, right pelvic side wall peritoneum 6) Right and left pelvic lymph nodes 7) Right and left para-aortic lymph nodes 8) Omentum 9) Anaerobic/Aerobic culture - preliminary read is no organisms    05/27/2018 Pathology Results   A: Ovary and fallopian tube, left, salpingo-oophorectomy - Clear cell carcinoma of left ovary, size 14.0 cm, with necrosis  - Carcinoma is adherent to fallopian tube, consistent with surface involvement (stage pT1c2) - Focal endometriosis present - See synoptic report and comment  B: Uterus with cervix and right ovary and fallopian tube, hysterectomy and right salpingo-oophorectomy Cervix: Benign with Nabothian cysts Endometrium: Endometrial polyp (size 3.5 cm); inactive endometrium with cystic glandular changes Myometrium: Leiomyomata with hyalinization and calcification (size up to 4.2 cm); focal adenomyosis  Right ovary: Benign physiologic changes Right fallopian tube: Small paratubal cyst and no malignancy identified  C: Lymph nodes, right pelvic, regional  resection - Nine lymph nodes with no metastatic carcinoma identified (0/9)  D: Lymph nodes, left pelvic, regional resection - Five lymph nodes with no metastatic carcinoma identified (0/5)  E: Cul-de-sac, anterior, biopsy - Fibroadipose tissue with no carcinoma identified  F: Cul-de-sac, posterior, biopsy - Fibrous tissue with inflammation and no carcinoma identified  G: Pelvic sidewall, right, biopsy - Fibroadipose tissue with focal crushed CD10 positive cells, cannot exclude endometrial type stroma - No carcinoma identified  H: Pelvic sidewall, left, biopsy - Fibroadipose tissue with focal crushed cells, cannot exclude endometrial type stroma - No carcinoma identified  I: Omentum, omentectomy - Adipose and fibrovascular tissue consistent with omentum - No carcinoma identified  J: Lymph nodes, left periaortic, regional resection - Two small lymph nodes with no metastatic carcinoma identified (0/2)  K: Lymph nodes, right periaortic, regional resection - Three small lymph nodes with no metastatic carcinoma identified (0/3)  This electronic signature is attestation that the pathologist personally reviewed the submitted material(s) and the final diagnosis reflects that evaluation.     Synoptic Report     OVARY or FALLOPIAN TUBE or PRIMARY PERITONEUM  (Ovary FT Perit - All Specimens)    SPECIMEN    Procedure:    Total hysterectomy and bilateral salpingo-oophorectomy     Procedure:    Omentectomy     Procedure:    Peritoneal  biopsies     Procedure:    Peritoneal washing   :        Specimen Integrity of Left Ovary:    Received largely intact with multiple areas of disruption   Tumor Site:    Left ovary   Histologic Type:    Clear cell carcinoma   Histologic Grade:    High grade   :  Tumor Size:    Greatest dimension in Centimeters (cm): 14.0 Centimeters (cm)      Additional Dimension in Centimeters (cm):    10 Centimeters (cm)      Additional Dimension in  Centimeters (cm):    7 Centimeters (cm)  Tumor Extent:        Ovarian Surface Involvement:    Present       Laterality:    Left     Other Tissue / Organ Involvement:    Not identified     Peritoneal Ascitic Fluid:    Negative for malignancy (normal / benign)     Pleural Fluid:    Not submitted / unknown   Accessory Findings:      LYMPH NODES  Regional Lymph Nodes:    All lymph nodes negative for tumor cells   Number of Lymph Nodes Examined:    19     Site(s):    Right pelvic: 9     Site(s):    Left pelvic: 5     Site(s):    Right para-aortic: 3     Site(s):    Left para-aortic: 2   PATHOLOGIC STAGE CLASSIFICATION (pTNM, AJCC 8th Edition)  Primary Tumor (pT):    pT1c2   Regional Lymph Nodes (pN):    pN0   FIGO STAGE  FIGO Stage:    IC2   ADDITIONAL FINDINGS  Additional Pathologic Findings:    Endometriosis   Additional Pathologic Findings:    Benign fallopian tube with adherence to carcinoma; also see additional parts   Comment(s)  Comment(s):    Also see diagnosis comment.      The frozen section diagnosis is confirmed for specimen A. Sections demonstrate a carcinoma with tubulocystic and papillary architecture with hyalinized cores, lined by cells with high grade nuclei, clear to eosinophilic cytoplasm, and nuclear hobnailing.  The morphologic features are most suggestive of clear cell carcinoma.  Immunohistochemical stains are performed, and demonstrate that the carcinoma is positive for CK7 and PAX8, consistent with gynecologic origin. It has patchy positivity for Napsin A, wild type p53 staining, and is negative for ER, supporting the diagnosis of clear cell carcinoma.   Sections of the tumor demonstrate carcinoma with adherence to fallopian tube surface, consistent with surface involvement.  Per the operative note, findings were also suggestive of preoperative rupture of the mass.  Given both these findings, the tumor is staged as a pT1c2 (FIGO IC2)            05/27/2018  Genetic Testing   Patient has genetic testing done for genetic disorder Results revealed patient has the following mutation(s): BRCA 1 on tissue block, neg blood   06/04/2018 Procedure   She had port placement   06/05/2018 Cancer Staging   Staging form: Ovary, Fallopian Tube, and Primary Peritoneal Carcinoma, AJCC 8th Edition - Pathologic: FIGO Stage IC2, calculated as Stage IC (pT1c2, pN0, cM0) - Signed by Heath Lark, MD on 06/05/2018   06/27/2018 Imaging   1. No evidence of residual/recurrent tumor in the pelvis. 2. No evidence of metastatic disease in the chest, abdomen or pelvis. 3. Left pelvic sidewall 6.3 x 5.0 cm simple fluid collection, compatible with postsurgical seroma or lymphocele. 4. Small hiatal hernia. 5.  Aortic Atherosclerosis (ICD10-I70.0).   06/27/2018 Tumor Marker   Patient's tumor was tested for the following markers: CA-125 Results of the tumor marker test revealed 90.3   07/01/2018 - 10/14/2018 Chemotherapy   The patient had palonosetron (ALOXI) injection 0.25 mg,  0.25 mg, Intravenous,  Once, 6 of 6 cycles  Administration: 0.25 mg (07/01/2018), 0.25 mg (07/22/2018), 0.25 mg (08/12/2018), 0.25 mg (09/02/2018), 0.25 mg (09/23/2018), 0.25 mg (10/14/2018)    CARBOplatin (PARAPLATIN) 420 mg in sodium chloride 0.9 % 250 mL chemo infusion, 420 mg (98.3 % of original dose 425.4 mg), Intravenous,  Once, 6 of 6 cycles  Dose modification:   (original dose 425.4 mg, Cycle 1), 380 mg (original dose 425.4 mg, Cycle 5, Reason: Dose not tolerated), 380 mg (original dose 425.4 mg, Cycle 6, Reason: Dose not tolerated)  Administration: 420 mg (07/01/2018), 440 mg (07/22/2018), 380 mg (08/12/2018), 440 mg (09/02/2018), 380 mg (09/23/2018), 380 mg (10/14/2018)    PACLitaxel (TAXOL) 270 mg in sodium chloride 0.9 % 250 mL chemo infusion (> 40m/m2), 175 mg/m2 = 270 mg, Intravenous,  Once, 6 of 6 cycles  Administration: 270 mg (07/01/2018), 270 mg (07/22/2018), 270 mg (08/12/2018), 270 mg  (09/02/2018), 270 mg (09/23/2018), 270 mg (10/14/2018)    fosaprepitant (EMEND) 150 mg, dexamethasone (DECADRON) 12 mg in sodium chloride 0.9 % 145 mL IVPB, , Intravenous,  Once, 6 of 6 cycles  Administration:  (07/01/2018),  (07/22/2018),  (08/12/2018),  (09/02/2018),  (09/23/2018),  (10/14/2018)  for chemotherapy treatment.     07/15/2018 Imaging   Vascular [place] consistent with acute deep vein thrombosis involving the right internal jugular veins. She is placed on Xarelto   07/21/2018 Tumor Marker   Patient's tumor was tested for the following markers: CA-125 Results of the tumor marker test revealed 22.8   09/22/2018 Tumor Marker   Patient's tumor was tested for the following markers: CA-125 Results of the tumor marker test revealed 14.5   11/10/2018 Imaging   Status post hysterectomy and bilateral salpingo-oophorectomy.   4.9 cm improving postoperative seroma along the left pelvic sidewall.   No evidence of recurrent or metastatic disease.     11/10/2018 Tumor Marker   Patient's tumor was tested for the following markers: CA-125 Results of the tumor marker test revealed 12.6   11/24/2018 Procedure   Successful removal of implanted Port-A-Cath.     Interval History: The patient presents today for surveillance visit.  She has been doing very well.  She [place] of the [place]  Her sister and husband purchased a place about 40-minute drive from them.  She reports a good appetite without any nausea or emesis.  She denies any vaginal bleeding or discharge.  She reports regular bowel and bladder function.  Past Medical/Surgical History: Past Medical History:  Diagnosis Date   [place]   Arthritis    in bilateral knees   Cancer ([place]    left ovarian cancer   Family history of colon cancer    Family history of ovarian cancer    GERD (gastroesophageal reflux disease)    History of DVT (deep vein thrombosis) 2019    had a catheter-related DVT during chemotherapy   Hyperlipidemia    Hypertension    Steroid myopathy 09/22/2018    Past Surgical History:  Procedure Laterality Date   ABDOMINAL HYSTERECTOMY     BIOPSY THYROID  2008   COLONOSCOPY     IR CV LINE INJECTION  07/15/2018   IR REMOVAL TUN ACCESS W/ PORT W/O FL MOD SED  11/24/2018   TONSILECTOMY, ADENOIDECTOMY, BILATERAL MYRINGOTOMY AND TUBES Bilateral 1957    Family History  Problem Relation Age of Onset   Hypertension Father    Diabetes  Father    Coronary artery disease Father    Hyperlipidemia Father    Colon cancer Father        49's   Hypertension Mother    Arthritis Mother    Cancer Maternal Aunt        type unk   Ovarian cancer Cousin 41   Cancer Cousin        mouth    Social History   Socioeconomic History   Marital status: Married    Spouse name: Louie Casa   Number of children: 3   Years of education: Not on file   Highest education level: Not on file  Occupational History   Occupation: retired Solicitor  Tobacco Use   Smoking status: Never   Smokeless tobacco: Never  Vaping Use   Vaping Use: Never used  Substance and Sexual Activity   Alcohol use: No   Drug use: No   Sexual activity: Not on file  Other Topics Concern   Not on file  Social History Narrative   Diet:Unrestricted   Do you drink/eat things with caffeine? Rarely   Marital status: Married                             What year were you married? 1992   Do you live in a house, apartment, assisted living, condo, trailer, etc)? House   Is it one or more stories? 1   How many persons live in your home? 3   Do you have any pets in your home?  2 small dogs   Current or past profession: Chief of Staff   Do you exercise?                                                     Type & how often:   Do you have a living will? No   Do you have a DNR Form? No   Do you have a POA/HPOA forms? NO   Social Determinants of Radio broadcast assistant  Strain: Not on file  Food Insecurity: Not on file  Transportation Needs: Not on file  Physical Activity: Not on file  Stress: Not on file  Social Connections: Not on file    Current Medications:  Current Outpatient Medications:    alendronate (FOSAMAX) 70 MG tablet, Take one every week with a full glass of water on an empty stomach, Disp: 12 tablet, Rfl: 1   Ascorbic Acid (VITAMIN C PO), Take 600 mg by mouth daily., Disp: , Rfl:    Biotin 10000 MCG TABS, Take 1 tablet by mouth daily., Disp: , Rfl:    BLACK COHOSH PO, Take 1 capsule by mouth daily., Disp: , Rfl:    Calcium-Magnesium-Vitamin D (CALCIUM 1200+D3 PO), , Disp: , Rfl:    Glucosamine HCl ([employer] GLUCOSAMINE PO), 2 tabs by mouth in the morning and 1 tab by mouth at night, Disp: , Rfl:    losartan-hydrochlorothiazide (HYZAAR) 50-12.5 MG tablet, Take 1 tablet by mouth daily., Disp: 90 tablet, Rfl: 1   Melatonin 10 MG CAPS, Take 1 tablet by mouth at bedtime. , Disp: , Rfl:    metoprolol tartrate (LOPRESSOR) 50 MG tablet, Take 1 tablet (50 mg total) by mouth 2 (two) times daily., Disp: 180 tablet, Rfl: 1  Multiple Vitamin (MULTIVITAMIN) tablet, Take 1 tablet by mouth 2 (two) times daily. , Disp: , Rfl:    Omega-3 Fatty Acids (OMEGA-3 FISH OIL PO), Take 1 tablet by mouth 2 (two) times daily., Disp: , Rfl:    omeprazole (PRILOSEC) 20 MG capsule, Take 1 capsule (20 mg total) by mouth daily., Disp: 90 capsule, Rfl: 1   Probiotic Product (PROBIOTIC-10 PO), , Disp: , Rfl:    simvastatin (ZOCOR) 20 MG tablet, Take 1 tablet (20 mg total) by mouth daily., Disp: 90 tablet, Rfl: 1  Review of Systems: Denies appetite changes, fevers, chills, fatigue, unexplained weight changes. Denies hearing loss, neck lumps or masses, mouth sores, ringing in ears or voice changes. Denies cough or wheezing.  Denies shortness of breath. Denies chest pain or palpitations. Denies leg swelling. Denies abdominal distention, pain, blood in stools, constipation,  diarrhea, nausea, vomiting, or early satiety. Denies pain with intercourse, dysuria, frequency, hematuria or incontinence. Denies hot flashes, pelvic pain, vaginal bleeding or vaginal discharge.   Denies joint pain, back pain or muscle pain/cramps. Denies itching, rash, or wounds. Denies dizziness, headaches, numbness or seizures. Denies swollen lymph nodes or glands, denies easy bruising or bleeding. Denies anxiety, depression, confusion, or decreased concentration.  Physical Exam: BP 139/72 (BP Location: Right Arm, Patient Position: Sitting)   Pulse (!) 55   Temp 98.1 F (36.7 C) (Oral)   Resp 18   Ht _0  (1.473 m)   Wt 160 lb 12.8 oz (72.9 kg)   SpO2 96%   BMI 33.61 kg/m  General: Alert, oriented, no acute distress. HEENT: Normocephalic, atraumatic, sclera anicteric. Chest: Unlabored breathing on room air. Cardiovascular: Regular rate and rhythm, no murmurs. Abdomen: Obese, soft, nontender.  Normoactive bowel sounds.  No masses or hepatosplenomegaly appreciated.  Well-healed scar. Extremities: Grossly normal range of motion.  Warm, well perfused.  No edema bilaterally. Skin: No rashes or lesions noted. Lymphatics: No cervical, supraclavicular, or inguinal adenopathy. GU: Normal appearing external genitalia without erythema, excoriation, or lesions.  Speculum exam reveals mildly atrophic vaginal mucosa, no lesions or masses.  No bleeding or discharge noted.  Bimanual exam reveals no masses or nodularity.  Rectovaginal exam confirms findings.  Laboratory & Radiologic Studies: Component Ref Range & Units 6 mo ago  (10/25/20) 9 mo ago  (07/26/20) 1 yr ago  (04/20/20) 1 yr ago  (01/26/20) 1 yr ago  (07/23/19) 2 yr ago  (02/13/19) 2 yr ago  (11/10/18)  Cancer Antigen (CA) 125 0.0 - 38.1 U/mL 14.2  12.6 CM  15.4 CM  13.8 CM  10.8 CM  11.8 CM  12.6 CM     Assessment & Plan: Melissa Simpson is a 70 y.o. woman with  Stage IC2 status post staging surgery in 05/2018 and completion of  adjuvant chemotherapy in 10/2018 who presents for surveillance.   The patient is doing very well and continues to be NED on exam.  She had her CA-125 drawn today and we will contact her tomorrow once results are back.  We had previously discussed per Society guidelines transitioning to visits every 4-6 months after she passed the 2-year mark.  Her preference had been visits every 6 months.  We will continue with that schedule and she will come back and see me in mid February.  She knows to call closer to the end of the year to get that visit scheduled.  We discussed signs and symptoms that if she were to develop should prompt a phone call to be  seen sooner.  30 minutes of total time was spent for this patient encounter, including preparation, face-to-face counseling with the patient and coordination of care, and documentation of the encounter.  Jeral Pinch, MD  Division of Gynecologic Oncology  Department of Obstetrics and Gynecology  [facility]

## 2021-04-27 ENCOUNTER — Telehealth: Payer: Self-pay

## 2021-04-27 LAB — CA 125: Cancer Antigen (CA) 125: 16.3 U/mL (ref 0.0–38.1)

## 2021-04-27 NOTE — Telephone Encounter (Signed)
Told Ms Speedy that her CA-125 was WNL at 16.3 per Dr. Berline Lopes. Pt verbalized understanding.

## 2021-05-08 ENCOUNTER — Ambulatory Visit (INDEPENDENT_AMBULATORY_CARE_PROVIDER_SITE_OTHER): Payer: Medicare HMO

## 2021-05-08 ENCOUNTER — Ambulatory Visit (INDEPENDENT_AMBULATORY_CARE_PROVIDER_SITE_OTHER): Payer: Medicare HMO | Admitting: Orthopedic Surgery

## 2021-05-08 ENCOUNTER — Telehealth: Payer: Self-pay

## 2021-05-08 ENCOUNTER — Other Ambulatory Visit: Payer: Self-pay

## 2021-05-08 ENCOUNTER — Encounter: Payer: Self-pay | Admitting: Orthopedic Surgery

## 2021-05-08 DIAGNOSIS — M79671 Pain in right foot: Secondary | ICD-10-CM | POA: Diagnosis not present

## 2021-05-08 DIAGNOSIS — M25562 Pain in left knee: Secondary | ICD-10-CM | POA: Diagnosis not present

## 2021-05-08 DIAGNOSIS — M1712 Unilateral primary osteoarthritis, left knee: Secondary | ICD-10-CM

## 2021-05-08 MED ORDER — LIDOCAINE HCL 1 % IJ SOLN
5.0000 mL | INTRAMUSCULAR | Status: AC | PRN
  Administered 2021-05-08: 5 mL

## 2021-05-08 MED ORDER — METHYLPREDNISOLONE ACETATE 40 MG/ML IJ SUSP
40.0000 mg | INTRAMUSCULAR | Status: AC | PRN
  Administered 2021-05-08: 40 mg via INTRA_ARTICULAR

## 2021-05-08 MED ORDER — BUPIVACAINE HCL 0.25 % IJ SOLN
4.0000 mL | INTRAMUSCULAR | Status: AC | PRN
  Administered 2021-05-08: 4 mL via INTRA_ARTICULAR

## 2021-05-08 NOTE — Telephone Encounter (Signed)
Noted  

## 2021-05-08 NOTE — Telephone Encounter (Signed)
Please submit prior auth for L knee gel injection

## 2021-05-08 NOTE — Progress Notes (Addendum)
Office Visit Note   Patient: Melissa Simpson           Date of Birth: [birth year]           MRN: [id number] Visit Date: 05/08/2021 Requested by: Lauree Chandler, NP [place],  [place] [address] PCP: Lauree Chandler, NP  Subjective: Chief Complaint  Patient presents with   Right Foot - Pain   Left Knee - Pain    HPI: Melissa Simpson is a 70 year old patient with left knee pain, right heel pain, and right thumb pain.  She has had a long history of left knee pain.  Last injection was over 3 years ago.  This was a gel injection which has given her excellent relief.  Patient also reports right heel pain of several weeks duration.  Worse in the morning.  Localizes the pain around the heel region.  It is a little bit better during the day.  However when she is on her feet for long period of time or she sits and then gets back up she has recurrent pain.  Denies any numbness and tingling in the foot.  She has tried a heel pad which helps some.  She does not do too much around the house barefoot.  Patient also reports right thumb pain.  This is been going on for several years.  Localizes the pain to the [facility] joint.  Denies any numbness or tingling in the hand.  Does have pain with pinch.              ROS: All systems reviewed are negative as they relate to the chief complaint within the history of present illness.  Patient denies  fevers or chills.   Assessment & Plan: Visit Diagnoses:  1. Left knee pain, unspecified chronicity   2. Pain in right foot     Plan: Impression is end-stage arthritis left knee.  She is actually doing reasonably well clinically based on the severity of the arthritis radiographically.  Cortisone injection performed today.  Preapproved for gel injection which gave her about 3 years of relief last injection.  Avoid loadbearing exercises.  Regarding the right heel this looks like plantar fasciitis.  Talked about stretching and modalities for that problem.  She  needs to wear shoes that have good arches to avoid stress on the plantar fascia.  Does not really look like a calcaneal stress fracture or stress reaction.  Plantar fascial injection ultrasound-guided would be another consideration but I did tell her that would be a painful injection.  She wants to hold off on that for now.  Regarding the lright thumb she has [place] arthritis on exam.  Discussed injection for that problem as well but she wants to hold off on that.  She will follow-up with [place] as needed if she wants to consider further intervention for the heel and hand.  Also come back for gel injection in that left knee when the cortisone wears off. This patient is diagnosed with osteoarthritis of the knee(s).    Radiographs show evidence of joint space narrowing, osteophytes, subchondral sclerosis and/or subchondral cysts.  This patient has knee pain which interferes with functional and activities of daily living.    This patient has experienced inadequate response, adverse effects and/or intolerance with conservative treatments such as acetaminophen, NSAIDS, topical creams, physical therapy or regular exercise, knee bracing and/or weight loss.   This patient has experienced inadequate response or has a contraindication to intra articular steroid injections  for at least 3 months.   This patient is not scheduled to have a total knee replacement within 6 months of starting treatment with viscosupplementation.   Follow-Up Instructions: Return if symptoms worsen or fail to improve.   Orders:  Orders Placed This Encounter  Procedures   XR Foot Complete Right   XR KNEE 3 VIEW LEFT   No orders of the defined types were placed in this encounter.     Procedures: Large Joint Inj: L knee on 05/08/2021 10:25 PM Indications: diagnostic evaluation, joint swelling and pain Details: 18 G 1.5 in needle, superolateral approach  Arthrogram: No  Medications: 5 mL lidocaine 1 %; 40 mg methylPREDNISolone  acetate 40 MG/ML; 4 mL bupivacaine 0.25 % Outcome: tolerated well, no immediate complications Procedure, treatment alternatives, risks and benefits explained, specific risks discussed. Consent was given by the patient. Immediately prior to procedure a time out was called to verify the correct patient, procedure, equipment, support staff and site/side marked as required. Patient was prepped and draped in the usual sterile fashion.      Clinical Data: No additional findings.  Objective: Vital Signs: There were no vitals taken for this visit.  Physical Exam:   Constitutional: Patient appears well-developed HEENT:  Head: Normocephalic Eyes:EOM are normal Neck: Normal range of motion Cardiovascular: Normal rate Pulmonary/chest: Effort normal Neurologic: Patient is alert Skin: Skin is warm Psychiatric: Patient has normal mood and affect   Ortho Exam: Ortho exam demonstrates slight varus alignment on the left.  She has mild effusion left no effusion right.  Collateral and cruciate ligaments are stable.  Patellofemoral crepitus is present.  No groin pain on the left with internal and external rotation of the leg.  Pedal pulses palpable bilaterally.  Ankle dorsiflexion intact.  Medial greater than lateral joint line tenderness is present on the left knee.  Range of motion is 0-1 15.  Right heel is examined.  She has tenderness to palpation of the plantar fascial.  Equivocal squeeze test.  Palpable intact nontender anterior to posterior to peroneal and Achilles tendon with symmetric and intact transverse tarsal subtalar and tibiotalar motion between the right and left side.  Patient right hand is examined.  She has positive grind test for [facility] arthritis and no snuffbox tenderness and no tenderness over the first dorsal compartment.  Wrist flexion extension intact.  Grip strength symmetric and intact with palpable radial pulse.  Negative Tinel's and no subluxation of the ulnar nerve at the  elbow.  Specialty Comments:  No specialty comments available.  Imaging: XR Foot Complete Right  Result Date: 05/08/2021 AP lateral merchant radiographs right foot reviewed.  No acute fracture.  Tarsometatarsal alignment intact.  Small plantar spur is present.  Boehler's angle slightly elevated.  No stress reaction or fracture noted in the calcaneus.  XR KNEE 3 VIEW LEFT  Result Date: 05/08/2021 AP lateral merchant radiographs left knee reviewed.  End-stage tricompartmental arthritis is present most severe in the medial compartment.  Mild varus alignment present.  No acute fracture.    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   Essential hypertension, benign 02/28/2017   Left medial knee pain 02/28/2017   Elevated liver function tests 02/27/2016   Hyperlipidemia  02/27/2016   Right shoulder pain 02/27/2016   Chronic right hip pain 02/27/2016   Family history of colon cancer 06/02/2015   Gastroesophageal reflux disease 06/02/2015   Chronic maxillary sinusitis 06/02/2015   Left thyroid nodule 06/02/2015   Chest pain 12/18/2013   Dyslipidemia 12/18/2013   HTN (hypertension) 12/18/2013   Past Medical History:  Diagnosis Date   AKI (acute kidney injury) ([place]) 05/23/2018   Arthritis    in bilateral knees   Cancer ([place])    left ovarian cancer   Family history of colon cancer    Family history of ovarian cancer    GERD (gastroesophageal reflux disease)    History of DVT (deep vein thrombosis) 2019   had a catheter-related DVT during chemotherapy   Hyperlipidemia    Hypertension    Steroid myopathy 09/22/2018    Family History  Problem Relation Age of Onset   Hypertension  Father    Diabetes Father    Coronary artery disease Father    Hyperlipidemia Father    Colon cancer Father        55's   Hypertension Mother    Arthritis Mother    Cancer Maternal Aunt        type unk   Ovarian cancer Cousin 37   Cancer Cousin        mouth    Past Surgical History:  Procedure Laterality Date   ABDOMINAL HYSTERECTOMY     BIOPSY THYROID  2008   COLONOSCOPY     IR CV LINE INJECTION  07/15/2018   IR REMOVAL TUN ACCESS W/ PORT W/O FL MOD SED  11/24/2018   TONSILECTOMY, ADENOIDECTOMY, BILATERAL MYRINGOTOMY AND TUBES Bilateral 1957   Social History   Occupational History   Occupation: retired Solicitor  Tobacco Use   Smoking status: Never   Smokeless tobacco: Never  Vaping Use   Vaping Use: Never used  Substance and Sexual Activity   Alcohol use: No   Drug use: No   Sexual activity: Not on file

## 2021-05-25 ENCOUNTER — Ambulatory Visit (INDEPENDENT_AMBULATORY_CARE_PROVIDER_SITE_OTHER): Payer: Medicare HMO | Admitting: Nurse Practitioner

## 2021-05-25 ENCOUNTER — Other Ambulatory Visit: Payer: Self-pay

## 2021-05-25 ENCOUNTER — Encounter: Payer: Self-pay | Admitting: Nurse Practitioner

## 2021-05-25 VITALS — BP 120/90 | HR 52 | Temp 98.0°F | Ht <= 58 in | Wt 161.6 lb

## 2021-05-25 DIAGNOSIS — R300 Dysuria: Secondary | ICD-10-CM

## 2021-05-25 DIAGNOSIS — R399 Unspecified symptoms and signs involving the genitourinary system: Secondary | ICD-10-CM | POA: Diagnosis not present

## 2021-05-25 LAB — POCT URINALYSIS DIPSTICK
Bilirubin, UA: NEGATIVE
Blood, UA: NEGATIVE
Glucose, UA: NEGATIVE
Ketones, UA: NEGATIVE
Nitrite, UA: NEGATIVE
Protein, UA: NEGATIVE
Spec Grav, UA: 1.015 (ref 1.010–1.025)
Urobilinogen, UA: 0.2 E.U./dL
pH, UA: 6.5 (ref 5.0–8.0)

## 2021-05-25 NOTE — Patient Instructions (Signed)
Complete cipro 500 mg course.  We will send urine for culture.  Stay well hydrated

## 2021-05-25 NOTE — Progress Notes (Signed)
Careteam: Patient Care Team: Lauree Chandler, NP as PCP - General (Geriatric Medicine)  PLACE OF SERVICE:  [facility]  Advanced Directive information    Allergies  Allergen Reactions   Pollen Extract     Chief Complaint  Patient presents with   Acute Visit    [place] UTI.Urinary frequency, pain on urination. Patient has been taking Cipro she had. Symptoms started Monday 05/22/2021. When symptoms first came on she took 2 doses of Azo. Did not take Cipro this morning or last night.     HPI: Patient is a 70 y.o. female pain, frequency with urination.  Started 4 days ago. Started cipro 500 mg by mouth twice daily and took for 2.5 days then stopped for appt today.  Symptoms improved after first dose.  She also took azo for immediate relief.  No symptoms at this time but has had 5 doses of cipro.   Review of Systems:  Review of Systems  Constitutional:  Negative for chills, fever and weight loss.  Genitourinary:  Negative for dysuria, frequency and urgency.       Symptoms all have resolved since being on cipro   Past Medical History:  Diagnosis Date   AKI (acute kidney injury) ([place]) 05/23/2018   Arthritis    in bilateral knees   Cancer ([place])    left ovarian cancer   Family history of colon cancer    Family history of ovarian cancer    GERD (gastroesophageal reflux disease)    History of DVT (deep vein thrombosis) 2019   had a catheter-related DVT during chemotherapy   Hyperlipidemia    Hypertension    Steroid myopathy 09/22/2018   Past Surgical History:  Procedure Laterality Date   ABDOMINAL HYSTERECTOMY     BIOPSY THYROID  2008   COLONOSCOPY     IR CV LINE INJECTION  07/15/2018   IR REMOVAL TUN ACCESS W/ PORT W/O FL MOD SED  11/24/2018   TONSILECTOMY, ADENOIDECTOMY, BILATERAL MYRINGOTOMY AND TUBES Bilateral 1957   Social History:   reports that she has never smoked. She has never used smokeless tobacco. She reports that she does not drink alcohol and does  not use drugs.  Family History  Problem Relation Age of Onset   Hypertension Father    Diabetes Father    Coronary artery disease Father    Hyperlipidemia Father    Colon cancer Father        85's   Hypertension Mother    Arthritis Mother    Cancer Maternal Aunt        type unk   Ovarian cancer Cousin 5   Cancer Cousin        mouth    Medications: Patient's Medications  New Prescriptions   No medications on file  Previous Medications   ALENDRONATE (FOSAMAX) 70 MG TABLET    Take one every week with a full glass of water on an empty stomach   ASCORBIC ACID (VITAMIN C PO)    Take 600 mg by mouth daily.   BIOTIN 69629 MCG TABS    Take 1 tablet by mouth daily.   BLACK COHOSH PO    Take 1 capsule by mouth daily.   CALCIUM-MAGNESIUM-VITAMIN D (CALCIUM 1200+D3 PO)       GLUCOSAMINE HCL ([facility] GLUCOSAMINE PO)    2 tabs by mouth in the morning and 1 tab by mouth at night   LOSARTAN-HYDROCHLOROTHIAZIDE (HYZAAR) 50-12.5 MG TABLET    Take 1 tablet by mouth  daily.   MELATONIN 10 MG CAPS    Take 1 tablet by mouth at bedtime.    METOPROLOL TARTRATE (LOPRESSOR) 50 MG TABLET    Take 1 tablet (50 mg total) by mouth 2 (two) times daily.   MULTIPLE VITAMIN (MULTIVITAMIN) TABLET    Take 1 tablet by mouth 2 (two) times daily.    OMEGA-3 FATTY ACIDS (OMEGA-3 FISH OIL PO)    Take 1 tablet by mouth 2 (two) times daily.   OMEPRAZOLE (PRILOSEC) 20 MG CAPSULE    Take 1 capsule (20 mg total) by mouth daily.   PROBIOTIC PRODUCT (PROBIOTIC-10 PO)       SIMVASTATIN (ZOCOR) 20 MG TABLET    Take 1 tablet (20 mg total) by mouth daily.  Modified Medications   No medications on file  Discontinued Medications   No medications on file    Physical Exam:  There were no vitals filed for this visit. There is no height or weight on file to calculate BMI. Wt Readings from Last 3 Encounters:  04/26/21 160 lb 12.8 oz (72.9 kg)  10/25/20 159 lb (72.1 kg)  09/22/20 161 lb 6.4 oz (73.2 kg)    Physical  Exam Constitutional:      General: She is not in acute distress.    Appearance: She is well-developed. She is not diaphoretic.  HENT:     Head: Normocephalic and atraumatic.     Mouth/Throat:     Pharynx: No oropharyngeal exudate.  Eyes:     Conjunctiva/sclera: Conjunctivae normal.     Pupils: Pupils are equal, round, and reactive to light.  Cardiovascular:     Rate and Rhythm: Normal rate and regular rhythm.     Heart sounds: Normal heart sounds.  Pulmonary:     Effort: Pulmonary effort is normal.     Breath sounds: Normal breath sounds.  Abdominal:     General: Bowel sounds are normal. There is no distension.     Palpations: Abdomen is soft.     Tenderness: There is no abdominal tenderness.  Musculoskeletal:     Cervical back: Normal range of motion and neck supple.     Right lower leg: No edema.     Left lower leg: No edema.  Skin:    General: Skin is warm and dry.  Neurological:     Mental Status: She is alert.  Psychiatric:        Mood and Affect: Mood normal.    Labs reviewed: Basic Metabolic Panel: Recent Labs    09/19/20 0936  NA 139  K 3.9  CL 103  CO2 28  GLUCOSE 110*  BUN 19  CREATININE 1.08*  CALCIUM 9.5   Liver Function Tests: Recent Labs    09/19/20 0936  AST 20  ALT 22  BILITOT 0.8  PROT 6.8   No results for input(s): LIPASE, AMYLASE in the last 8760 hours. No results for input(s): AMMONIA in the last 8760 hours. CBC: No results for input(s): WBC, NEUTROABS, HGB, HCT, MCV, PLT in the last 8760 hours. Lipid Panel: Recent Labs    09/19/20 0936  CHOL 142  HDL 44*  LDLCALC 72  TRIG 193*  CHOLHDL 3.2   TSH: No results for input(s): TSH in the last 8760 hours. A1C: Lab Results  Component Value Date   HGBA1C 6.1 (H) 09/19/2020     Assessment/Plan 1. Dysuria Resolved with Cipro. She will take additional dose to complete 3 day course.  -continue to stay hydrated  - POC  Urinalysis Dipstick- shows leukocystes will send for culture  to ensure sensitivity.  - Culture, Urine   Next appt: 09/21/2021 [facility]. [place], [place] [facility] [phone]

## 2021-05-27 LAB — URINE CULTURE
MICRO NUMBER:: 12380824
Result:: NO GROWTH
SPECIMEN QUALITY:: ADEQUATE

## 2021-06-15 ENCOUNTER — Telehealth: Payer: Self-pay

## 2021-06-15 NOTE — Telephone Encounter (Signed)
VOB has been submitted for Monovisc, left knee. Pending BV. 

## 2021-06-16 ENCOUNTER — Telehealth: Payer: Self-pay

## 2021-06-16 NOTE — Telephone Encounter (Signed)
Called to schedule for Monovisc, left knee, but patient declined at this time and will call back when she is ready to proceed.  Approved for Monovisc, left knee. [place] Patient will be responsible for 20% OOP. Co-pay of $20.00 [place] Approval# [id number] Valid 06/15/2021- 09/13/2021

## 2021-09-07 ENCOUNTER — Other Ambulatory Visit: Payer: Self-pay | Admitting: Nurse Practitioner

## 2021-09-07 DIAGNOSIS — I10 Essential (primary) hypertension: Secondary | ICD-10-CM

## 2021-09-07 DIAGNOSIS — K219 Gastro-esophageal reflux disease without esophagitis: Secondary | ICD-10-CM

## 2021-09-07 DIAGNOSIS — E781 Pure hyperglyceridemia: Secondary | ICD-10-CM

## 2021-09-21 ENCOUNTER — Other Ambulatory Visit: Payer: Self-pay

## 2021-09-21 ENCOUNTER — Encounter: Payer: Self-pay | Admitting: Nurse Practitioner

## 2021-09-21 ENCOUNTER — Other Ambulatory Visit: Payer: Medicare HMO

## 2021-09-21 ENCOUNTER — Ambulatory Visit (INDEPENDENT_AMBULATORY_CARE_PROVIDER_SITE_OTHER): Payer: Medicare HMO | Admitting: Nurse Practitioner

## 2021-09-21 DIAGNOSIS — E781 Pure hyperglyceridemia: Secondary | ICD-10-CM

## 2021-09-21 DIAGNOSIS — Z0000 Encounter for general adult medical examination without abnormal findings: Secondary | ICD-10-CM

## 2021-09-21 DIAGNOSIS — E2839 Other primary ovarian failure: Secondary | ICD-10-CM

## 2021-09-21 DIAGNOSIS — I10 Essential (primary) hypertension: Secondary | ICD-10-CM | POA: Diagnosis not present

## 2021-09-21 DIAGNOSIS — R739 Hyperglycemia, unspecified: Secondary | ICD-10-CM | POA: Diagnosis not present

## 2021-09-21 NOTE — Patient Instructions (Signed)
Melissa Simpson , Thank you for taking time to come for your Medicare Wellness Visit. I appreciate your ongoing commitment to your health goals. Please review the following plan we discussed and let me know if I can assist you in the future.   Screening recommendations/referrals: Colonoscopy up to date Mammogram up to date Bone Density ordered- to schedule at [phone] Recommended yearly ophthalmology/optometry visit for glaucoma screening and checkup Recommended yearly dental visit for hygiene and checkup  Vaccinations: Influenza vaccine up to date Pneumococcal vaccine up to date Tdap vaccine up to date Shingles vaccine up to date    Advanced directives: on file   Conditions/risks identified: advanced age, obesity, sedentary lifestyle   Next appointment: yearly for awv   Preventive Care 3 Years and Older, Female Preventive care refers to lifestyle choices and visits with your health care provider that can promote health and wellness. What does preventive care include? A yearly physical exam. This is also called an annual well check. Dental exams once or twice a year. Routine eye exams. Ask your health care provider how often you should have your eyes checked. Personal lifestyle choices, including: Daily care of your teeth and gums. Regular physical activity. Eating a healthy diet. Avoiding tobacco and drug use. Limiting alcohol use. Practicing safe sex. Taking low-dose aspirin every day. Taking vitamin and mineral supplements as recommended by your health care provider. What happens during an annual well check? The services and screenings done by your health care provider during your annual well check will depend on your age, overall health, lifestyle risk factors, and family history of disease. Counseling  Your health care provider may ask you questions about your: Alcohol use. Tobacco use. Drug use. Emotional well-being. Home and relationship well-being. Sexual  activity. Eating habits. History of falls. Memory and ability to understand (cognition). Work and work Statistician. Reproductive health. Screening  You may have the following tests or measurements: Height, weight, and BMI. Blood pressure. Lipid and cholesterol levels. These may be checked every 5 years, or more frequently if you are over 17 years old. Skin check. Lung cancer screening. You may have this screening every year starting at age 49 if you have a 30-pack-year history of smoking and currently smoke or have quit within the past 15 years. Fecal occult blood test (FOBT) of the stool. You may have this test every year starting at age 38. Flexible sigmoidoscopy or colonoscopy. You may have a sigmoidoscopy every 5 years or a colonoscopy every 10 years starting at age 92. Hepatitis C blood test. Hepatitis B blood test. Sexually transmitted disease (STD) testing. Diabetes screening. This is done by checking your blood sugar (glucose) after you have not eaten for a while (fasting). You may have this done every 1-3 years. Bone density scan. This is done to screen for osteoporosis. You may have this done starting at age 20. Mammogram. This may be done every 1-2 years. Talk to your health care provider about how often you should have regular mammograms. Talk with your health care provider about your test results, treatment options, and if necessary, the need for more tests. Vaccines  Your health care provider may recommend certain vaccines, such as: Influenza vaccine. This is recommended every year. Tetanus, diphtheria, and acellular pertussis (Tdap, Td) vaccine. You may need a Td booster every 10 years. Zoster vaccine. You may need this after age 17. Pneumococcal 13-valent conjugate (PCV13) vaccine. One dose is recommended after age 34. Pneumococcal polysaccharide (PPSV23) vaccine. One dose is recommended after  age 20. Talk to your health care provider about which screenings and vaccines  you need and how often you need them. This information is not intended to replace advice given to you by your health care provider. Make sure you discuss any questions you have with your health care provider. Document Released: 09/23/2015 Document Revised: 05/16/2016 Document Reviewed: 06/28/2015 Elsevier Interactive Patient Education  2017 [place] Prevention in the Home Falls can cause injuries. They can happen to people of all ages. There are many things you can do to make your home safe and to help prevent falls. What can I do on the outside of my home? Regularly fix the edges of walkways and driveways and fix any cracks. Remove anything that might make you trip as you walk through a door, such as a raised step or threshold. Trim any bushes or trees on the path to your home. Use bright outdoor lighting. Clear any walking paths of anything that might make someone trip, such as rocks or tools. Regularly check to see if handrails are loose or broken. Make sure that both sides of any steps have handrails. Any raised decks and porches should have guardrails on the edges. Have any leaves, snow, or ice cleared regularly. Use sand or salt on walking paths during winter. Clean up any spills in your garage right away. This includes oil or grease spills. What can I do in the bathroom? Use night lights. Install grab bars by the toilet and in the tub and shower. Do not use towel bars as grab bars. Use non-skid mats or decals in the tub or shower. If you need to sit down in the shower, use a plastic, non-slip stool. Keep the floor dry. Clean up any water that spills on the floor as soon as it happens. Remove soap buildup in the tub or shower regularly. Attach bath mats securely with double-sided non-slip rug tape. Do not have throw rugs and other things on the floor that can make you trip. What can I do in the bedroom? Use night lights. Make sure that you have a light by your bed that  is easy to reach. Do not use any sheets or blankets that are too big for your bed. They should not hang down onto the floor. Have a firm chair that has side arms. You can use this for support while you get dressed. Do not have throw rugs and other things on the floor that can make you trip. What can I do in the kitchen? Clean up any spills right away. Avoid walking on wet floors. Keep items that you use a lot in easy-to-reach places. If you need to reach something above you, use a strong step stool that has a grab bar. Keep electrical cords out of the way. Do not use floor polish or wax that makes floors slippery. If you must use wax, use non-skid floor wax. Do not have throw rugs and other things on the floor that can make you trip. What can I do with my stairs? Do not leave any items on the stairs. Make sure that there are handrails on both sides of the stairs and use them. Fix handrails that are broken or loose. Make sure that handrails are as long as the stairways. Check any carpeting to make sure that it is firmly attached to the stairs. Fix any carpet that is loose or worn. Avoid having throw rugs at the top or bottom of the stairs. If you do  have throw rugs, attach them to the floor with carpet tape. Make sure that you have a light switch at the top of the stairs and the bottom of the stairs. If you do not have them, ask someone to add them for you. What else can I do to help prevent falls? Wear shoes that: Do not have high heels. Have rubber bottoms. Are comfortable and fit you well. Are closed at the toe. Do not wear sandals. If you use a stepladder: Make sure that it is fully opened. Do not climb a closed stepladder. Make sure that both sides of the stepladder are locked into place. Ask someone to hold it for you, if possible. Clearly mark and make sure that you can see: Any grab bars or handrails. First and last steps. Where the edge of each step is. Use tools that help you  move around (mobility aids) if they are needed. These include: Canes. Walkers. Scooters. Crutches. Turn on the lights when you go into a dark area. Replace any light bulbs as soon as they burn out. Set up your furniture so you have a clear path. Avoid moving your furniture around. If any of your floors are uneven, fix them. If there are any pets around you, be aware of where they are. Review your medicines with your doctor. Some medicines can make you feel dizzy. This can increase your chance of falling. Ask your doctor what other things that you can do to help prevent falls. This information is not intended to replace advice given to you by your health care provider. Make sure you discuss any questions you have with your health care provider. Document Released: 06/23/2009 Document Revised: 02/02/2016 Document Reviewed: 10/01/2014 Elsevier Interactive Patient Education  2017 [employer].

## 2021-09-21 NOTE — Progress Notes (Signed)
This service is provided via telemedicine  No vital signs collected/recorded due to the encounter was a telemedicine visit.   Location of patient (ex: home, work):  Home  Patient consents to a telephone visit:  Yes, see encounter dated 09/21/2021  Location of the provider (ex: office, home):  [place]  Name of any referring provider:  N/A  Names of all persons participating in the telemedicine service and their role in the encounter:  Sherrie Mustache, Nurse Practitioner, Carroll Kinds, CMA, and patient.   Time spent on call:  11 minutes with medical assistant

## 2021-09-21 NOTE — Progress Notes (Signed)
Subjective:   Melissa Simpson is a 71 y.o. female who presents for Medicare Annual (Subsequent) preventive examination.  Review of Systems           Objective:    There were no vitals filed for this visit. There is no height or weight on file to calculate BMI.  Advanced Directives 09/21/2021 10/25/2020 09/22/2020 09/20/2020 09/05/2020 07/25/2020 03/21/2020  Does Patient Have a Medical Advance Directive? Yes Yes Yes Yes Yes Yes Yes  Type of Paramedic of Melissa Simpson;Living will Melissa Simpson;Living will Melissa Simpson;Living will Melissa Simpson;Living will - [place]  Does patient want to make changes to medical advance directive? No - Patient declined - No - Patient declined No - Patient declined No - Guardian declined - No - Patient declined  Copy of Melissa Simpson in Chart? Yes - validated most recent copy scanned in chart (See row information) Yes - validated most recent copy scanned in chart (See row information) Yes - validated most recent copy scanned in chart (See row information) Yes - validated most recent copy scanned in chart (See row information) Yes - validated most recent copy scanned in chart (See row information) - -  Would patient like information on creating a medical advance directive? - - - - - - -    Current Medications (verified) Outpatient Encounter Medications as of 09/21/2021  Medication Sig   alendronate (FOSAMAX) 70 MG tablet Take one every week with a full glass of water on an empty stomach   Ascorbic Acid (VITAMIN C PO) Take 500 mg by mouth daily.   BLACK COHOSH PO Take 1 capsule by mouth daily.   Calcium-Magnesium-Vitamin D (CALCIUM 1200+D3 PO)    Glucosamine HCl ([facility]S GLUCOSAMINE PO) 2 tabs by mouth in the morning and 1 tab by mouth at night   losartan-hydrochlorothiazide (HYZAAR) 50-12.5 MG tablet TAKE 1 TABLET EVERY DAY   metoprolol  tartrate (LOPRESSOR) 50 MG tablet TAKE 1 TABLET (50 MG TOTAL) BY MOUTH 2 (TWO) TIMES DAILY.   Multiple Vitamin (MULTIVITAMIN) tablet Take 1 tablet by mouth 2 (two) times daily.    Omega-3 Fatty Acids (OMEGA-3 FISH OIL PO) Take 1 tablet by mouth 2 (two) times daily.   omeprazole (PRILOSEC) 20 MG capsule TAKE 1 CAPSULE EVERY DAY   Probiotic Product (PROBIOTIC-10 PO)    simvastatin (ZOCOR) 20 MG tablet TAKE 1 TABLET EVERY DAY   Biotin 10000 MCG TABS Take 1 tablet by mouth daily.   Melatonin 10 MG CAPS Take 1 tablet by mouth at bedtime.  (Patient not taking: Reported on 05/25/2021)   No facility-administered encounter medications on file as of 09/21/2021.    Allergies (verified) Pollen extract   History: Past Medical History:  Diagnosis Date   AKI (acute kidney injury) ([place]) 05/23/2018   Arthritis    in bilateral knees   Cancer ([place])    left ovarian cancer   Family history of colon cancer    Family history of ovarian cancer    GERD (gastroesophageal reflux disease)    History of DVT (deep vein thrombosis) 2019   had a catheter-related DVT during chemotherapy   Hyperlipidemia    Hypertension    Steroid myopathy 09/22/2018   Past Surgical History:  Procedure Laterality Date   ABDOMINAL HYSTERECTOMY     BIOPSY THYROID  2008   COLONOSCOPY     IR CV LINE INJECTION  07/15/2018  IR REMOVAL TUN ACCESS W/ PORT W/O FL MOD SED  11/24/2018   TONSILECTOMY, ADENOIDECTOMY, BILATERAL MYRINGOTOMY AND TUBES Bilateral 1957   Family History  Problem Relation Age of Onset   Hypertension Father    Diabetes Father    Coronary artery disease Father    Hyperlipidemia Father    Colon cancer Father        69's   Hypertension Mother    Arthritis Mother    Cancer Maternal Aunt        type unk   Ovarian cancer Cousin 31   Cancer Cousin        mouth   Social History   Socioeconomic History   Marital status: Married    Spouse name: Melissa Simpson   Number of children: 3   Years of education: Not on file    Highest education level: Not on file  Occupational History   Occupation: retired Solicitor  Tobacco Use   Smoking status: Never   Smokeless tobacco: Never  Vaping Use   Vaping Use: Never used  Substance and Sexual Activity   Alcohol use: No   Drug use: No   Sexual activity: Not on file  Other Topics Concern   Not on file  Social History Narrative   Diet:Unrestricted   Do you drink/eat things with caffeine? Rarely   Marital status: Married                             What year were you married? 1992   Do you live in a house, apartment, assisted living, condo, trailer, etc)? House   Is it one or more stories? 1   How many persons live in your home? 3   Do you have any pets in your home?  2 small dogs   Current or past profession: Chief of Staff   Do you exercise?                                                     Type & how often:   Do you have a living will? No   Do you have a DNR Form? No   Do you have a POA/HPOA forms? NO   Social Determinants of Radio broadcast assistant Strain: Not on file  Food Insecurity: Not on file  Transportation Needs: Not on file  Physical Activity: Not on file  Stress: Not on file  Social Connections: Not on file    Tobacco Counseling Counseling given: Not Answered   Clinical Intake:                 Diabetic?no         Activities of Daily Living No flowsheet data found.  Patient Care Team: Melissa Chandler, NP as PCP - General (Geriatric Medicine)  Indicate any recent Medical Services you may have received from other than [facility] providers in the past year (date may be approximate).     Assessment:   This is a routine wellness examination for Melissa Simpson.  Hearing/Vision screen Hearing Screening - Comments:: No problems Vision Screening - Comments:: Patient wears glasses. Patient had last eye exam last year. Patient goes to [facility] care  Dietary issues and exercise activities discussed:     Goals  Addressed   None  Depression Screen PHQ 2/9 Scores 09/21/2021 09/22/2020 09/20/2020 09/21/2019 08/28/2019 02/26/2019 09/18/2018  PHQ - 2 Score 0 0 0 0 0 0 0    Fall Risk Fall Risk  09/21/2021 09/22/2020 09/20/2020 09/05/2020 03/21/2020  Falls in the past year? 0 1 1 0 0  Comment - - Patient fell off ladder putting up decorations - -  Number falls in past yr: 0 0 1 0 0  Injury with Fall? 0 0 0 0 0  Follow up Falls evaluation completed - - - -    FALL RISK PREVENTION PERTAINING TO THE HOME:  Any stairs in or around the home? No  If so, are there any without handrails? No  Home free of loose throw rugs in walkways, pet beds, electrical cords, etc? Yes  Adequate lighting in your home to reduce risk of falls? Yes   ASSISTIVE DEVICES UTILIZED TO PREVENT FALLS:  Life alert? No  Use of a cane, walker or w/c? No  Grab bars in the bathroom? Yes  Shower chair or bench in shower? Yes  Elevated toilet seat or a handicapped toilet? Yes   TIMED UP AND GO:  Was the test performed? No .    Cognitive Function: MMSE - Mini Mental State Exam 09/15/2018 09/05/2017 08/30/2016  Orientation to time 5 5 5   Orientation to Place 5 5 5   Registration 3 3 3   Attention/ Calculation 5 5 5   Recall 3 2 3   Language- name 2 objects 2 2 2   Language- repeat 1 1 1   Language- follow 3 step command 3 3 3   Language- read & follow direction 1 1 1   Write a sentence 1 1 1   Copy design 1 1 1   Total score 30 29 30      6CIT Screen 09/21/2021 09/20/2020  What Year? 0 points 0 points  What month? 0 points 0 points  What time? 0 points 0 points  Count back from 20 0 points 0 points  Months in reverse 0 points 0 points  Repeat phrase 0 points 0 points  Total Score 0 0    Immunizations Immunization History  Administered Date(s) Administered   Influenza, High Dose Seasonal PF 06/10/2017, 06/09/2018, 06/03/2019, 07/11/2020   Influenza-Unspecified 06/11/2014, 05/27/2015, 06/27/2016, 05/18/2021   PFIZER(Purple  Top)SARS-COV-2 Vaccination 04/12/2020, 05/03/2020   Pneumococcal Conjugate-13 07/06/2014   Pneumococcal Polysaccharide-23 08/29/2015   Tdap 06/23/2012   Zoster Recombinat (Shingrix) 09/18/2018, 12/22/2018    TDAP status: Up to date  Flu Vaccine status: Up to date  Pneumococcal vaccine status: Up to date  Covid-19 vaccine status: Information provided on how to obtain vaccines.   Qualifies for Shingles Vaccine? Yes   Zostavax completed No   Shingrix Completed?: Yes  Screening Tests Health Maintenance  Topic Date Due   COVID-19 Vaccine (3 - Pfizer risk series) 05/31/2020   TETANUS/TDAP  06/23/2022   MAMMOGRAM  12/28/2022   COLONOSCOPY (Pts 45-32yrs Insurance coverage will need to be confirmed)  11/06/2026   Pneumonia Vaccine 31+ Years old  Completed   INFLUENZA VACCINE  Completed   DEXA SCAN  Completed   Hepatitis C Screening  Completed   Zoster Vaccines- Shingrix  Completed   HPV VACCINES  Aged Out    Health Maintenance  Health Maintenance Due  Topic Date Due   COVID-19 Vaccine (3 - Pfizer risk series) 05/31/2020    Colorectal cancer screening: Type of screening: Colonoscopy. Completed 2018. Repeat every 10 years  Mammogram status: Completed 12/27/20. Repeat every year  Bone Density status:  Ordered today. Pt provided with contact info and advised to call to schedule appt.  Lung Cancer Screening: (Low Dose CT Chest recommended if Age 64-80 years, 30 pack-year currently smoking OR have quit w/in 15years.) does not qualify.   Additional Screening:  Hepatitis C Screening: does qualify; Completed 08/25/2019  Vision Screening: Recommended annual ophthalmology exams for early detection of glaucoma and other disorders of the eye. Is the patient up to date with their annual eye exam?  Yes  Who is the provider or what is the name of the office in which the patient attends annual eye exams? [facility] If pt is not established with a provider, would they like to be referred  to a provider to establish care? No .   Dental Screening: Recommended annual dental exams for proper oral hygiene  Community Resource Referral / Chronic Care Management: CRR required this visit?  No   CCM required this visit?  No      Plan:     I have personally reviewed and noted the following in the patients chart:   Medical and social history Use of alcohol, tobacco or illicit drugs  Current medications and supplements including opioid prescriptions.  Functional ability and status Nutritional status Physical activity Advanced directives List of other physicians Hospitalizations, surgeries, and ER visits in previous 12 months Vitals Screenings to include cognitive, depression, and falls Referrals and appointments  In addition, I have reviewed and discussed with patient certain preventive protocols, quality metrics, and best practice recommendations. A written personalized care plan for preventive services as well as general preventive health recommendations were provided to patient.     Melissa Chandler, NP   09/21/2021     Virtual Visit via Telephone Note  I connected withNAME@ on 09/21/21 at 11:00 AM EST by telephone and verified that I am speaking with the correct person using two identifiers.  Location: Patient: home Provider: [place]   I discussed the limitations, risks, security and privacy concerns of performing an evaluation and management service by telephone and the availability of in person appointments. I also discussed with the patient that there may be a patient responsible charge related to this service. The patient expressed understanding and agreed to proceed.   I discussed the assessment and treatment plan with the patient. The patient was provided an opportunity to ask questions and all were answered. The patient agreed with the plan and demonstrated an understanding of the instructions.   The patient was advised to call back or seek an  in-person evaluation if the symptoms worsen or if the condition fails to improve as anticipated.  I provided 16 minutes of non-face-to-face time during this encounter.  Carlos American. Harle Battiest Avs printed and mailed

## 2021-09-22 LAB — CBC WITH DIFFERENTIAL/PLATELET
Absolute Monocytes: 515 cells/uL (ref 200–950)
Basophils Absolute: 40 cells/uL (ref 0–200)
Basophils Relative: 0.8 %
Eosinophils Absolute: 130 cells/uL (ref 15–500)
Eosinophils Relative: 2.6 %
HCT: 44.8 % (ref 35.0–45.0)
Hemoglobin: 15.2 g/dL (ref 11.7–15.5)
Lymphs Abs: 1430 cells/uL (ref 850–3900)
MCH: 34 pg — ABNORMAL HIGH (ref 27.0–33.0)
MCHC: 33.9 g/dL (ref 32.0–36.0)
MCV: 100.2 fL — ABNORMAL HIGH (ref 80.0–100.0)
MPV: 9.7 fL (ref 7.5–12.5)
Monocytes Relative: 10.3 %
Neutro Abs: 2885 cells/uL (ref 1500–7800)
Neutrophils Relative %: 57.7 %
Platelets: 303 10*3/uL (ref 140–400)
RBC: 4.47 10*6/uL (ref 3.80–5.10)
RDW: 11.5 % (ref 11.0–15.0)
Total Lymphocyte: 28.6 %
WBC: 5 10*3/uL (ref 3.8–10.8)

## 2021-09-22 LAB — COMPLETE METABOLIC PANEL WITH GFR
AG Ratio: 1.5 (calc) (ref 1.0–2.5)
ALT: 26 U/L (ref 6–29)
AST: 25 U/L (ref 10–35)
Albumin: 4.3 g/dL (ref 3.6–5.1)
Alkaline phosphatase (APISO): 29 U/L — ABNORMAL LOW (ref 37–153)
BUN/Creatinine Ratio: 17 (calc) (ref 6–22)
BUN: 20 mg/dL (ref 7–25)
CO2: 29 mmol/L (ref 20–32)
Calcium: 9.8 mg/dL (ref 8.6–10.4)
Chloride: 101 mmol/L (ref 98–110)
Creat: 1.17 mg/dL — ABNORMAL HIGH (ref 0.60–1.00)
Globulin: 2.8 g/dL (calc) (ref 1.9–3.7)
Glucose, Bld: 97 mg/dL (ref 65–99)
Potassium: 3.9 mmol/L (ref 3.5–5.3)
Sodium: 139 mmol/L (ref 135–146)
Total Bilirubin: 0.9 mg/dL (ref 0.2–1.2)
Total Protein: 7.1 g/dL (ref 6.1–8.1)
eGFR: 49 mL/min/{1.73_m2} — ABNORMAL LOW (ref >=60)

## 2021-09-22 LAB — HEMOGLOBIN A1C
Hgb A1c MFr Bld: 6 % of total Hgb — ABNORMAL HIGH (ref <5.7)
Mean Plasma Glucose: 126 mg/dL
eAG (mmol/L): 7 mmol/L

## 2021-09-22 LAB — LIPID PANEL
Cholesterol: 140 mg/dL (ref <200)
HDL: 47 mg/dL — ABNORMAL LOW (ref >=50)
LDL Cholesterol (Calc): 66 mg/dL (calc)
Non-HDL Cholesterol (Calc): 93 mg/dL (calc) (ref <130)
Total CHOL/HDL Ratio: 3 (calc) (ref <5.0)
Triglycerides: 196 mg/dL — ABNORMAL HIGH (ref <150)

## 2021-09-25 ENCOUNTER — Ambulatory Visit: Payer: Medicare HMO | Admitting: Nurse Practitioner

## 2021-09-25 ENCOUNTER — Ambulatory Visit: Payer: Medicare HMO | Admitting: Internal Medicine

## 2021-10-02 ENCOUNTER — Other Ambulatory Visit: Payer: Self-pay

## 2021-10-02 ENCOUNTER — Encounter: Payer: Self-pay | Admitting: Nurse Practitioner

## 2021-10-02 ENCOUNTER — Ambulatory Visit (INDEPENDENT_AMBULATORY_CARE_PROVIDER_SITE_OTHER): Payer: Medicare HMO | Admitting: Nurse Practitioner

## 2021-10-02 VITALS — BP 138/86 | HR 56 | Temp 97.8°F | Ht <= 58 in | Wt 161.0 lb

## 2021-10-02 DIAGNOSIS — E785 Hyperlipidemia, unspecified: Secondary | ICD-10-CM | POA: Diagnosis not present

## 2021-10-02 DIAGNOSIS — Z8543 Personal history of malignant neoplasm of ovary: Secondary | ICD-10-CM

## 2021-10-02 DIAGNOSIS — F5101 Primary insomnia: Secondary | ICD-10-CM | POA: Diagnosis not present

## 2021-10-02 DIAGNOSIS — R739 Hyperglycemia, unspecified: Secondary | ICD-10-CM

## 2021-10-02 DIAGNOSIS — M1712 Unilateral primary osteoarthritis, left knee: Secondary | ICD-10-CM | POA: Diagnosis not present

## 2021-10-02 DIAGNOSIS — K219 Gastro-esophageal reflux disease without esophagitis: Secondary | ICD-10-CM

## 2021-10-02 DIAGNOSIS — J309 Allergic rhinitis, unspecified: Secondary | ICD-10-CM | POA: Diagnosis not present

## 2021-10-02 DIAGNOSIS — E6609 Other obesity due to excess calories: Secondary | ICD-10-CM | POA: Diagnosis not present

## 2021-10-02 DIAGNOSIS — I10 Essential (primary) hypertension: Secondary | ICD-10-CM

## 2021-10-02 DIAGNOSIS — Z6832 Body mass index (BMI) 32.0-32.9, adult: Secondary | ICD-10-CM

## 2021-10-02 MED ORDER — LOSARTAN POTASSIUM 50 MG PO TABS: 50.0000 mg | ORAL_TABLET | Freq: Every day | ORAL | 0 refills | Status: DC

## 2021-10-02 NOTE — Patient Instructions (Signed)
Dentist  Dr Leontine Locket [place] [address]  [phone]     Body scanning when trying to sleep Paper and pencil at bedside to "write down worries"  schedule another "worry" time Read handout on sleep

## 2021-10-02 NOTE — Progress Notes (Signed)
Careteam: Patient Care Team: Lauree Chandler, NP as PCP - General (Geriatric Medicine)  PLACE OF SERVICE:  [place] Directive information Does Patient Have a Medical Advance Directive?: Yes, Type of Advance Directive: [place];Living will, Does patient want to make changes to medical advance directive?: No - Patient declined  Allergies  Allergen Reactions   Pollen Extract     Chief Complaint  Patient presents with   Annual Exam    Yearly physical. Discuss labs (copy printed). Discuss need for covid boosters or post pone if patient refuses.      HPI: Patient is a 71 y.o. female yearly follow up.   Unsure if she is going to continue to get booster. Got 2 and 1 booster.   Does not drink a lot of water during the day. Usually only gets water twice a day. Does not get thirsty.   Blood pressure at home 120-130s. Always below 140.   In 2019 had left ovarian cancer, this has been removed and no signs of recurrence, continues to follow up with oncologist every 6 months.   Hyperlipdiemia- continues on zocor.   Exercise limited due to knee pain. Needing to get another injection- followed by orthropedics  GERD- omeprazole controlling symptoms.   Insomnia- taking diphenhydramine   Review of Systems:  Review of Systems  Constitutional:  Negative for chills, fever and weight loss.  HENT:  Negative for tinnitus.   Respiratory:  Negative for cough, sputum production and shortness of breath.   Cardiovascular:  Negative for chest pain, palpitations and leg swelling.  Gastrointestinal:  Negative for abdominal pain, constipation, diarrhea and heartburn.  Genitourinary:  Negative for dysuria, frequency and urgency.  Musculoskeletal:  Positive for joint pain. Negative for back pain, falls and myalgias.  Skin: Negative.   Neurological:  Negative for dizziness and headaches.  Psychiatric/Behavioral:  Negative for depression and memory loss. The  patient has insomnia.    Past Medical History:  Diagnosis Date   AKI (acute kidney injury) ([place]) 05/23/2018   Arthritis    in bilateral knees   Cancer ([place])    left ovarian cancer   Family history of colon cancer    Family history of ovarian cancer    GERD (gastroesophageal reflux disease)    History of DVT (deep vein thrombosis) 2019   had a catheter-related DVT during chemotherapy   Hyperlipidemia    Hypertension    Steroid myopathy 09/22/2018   Past Surgical History:  Procedure Laterality Date   ABDOMINAL HYSTERECTOMY     BIOPSY THYROID  2008   COLONOSCOPY     IR CV LINE INJECTION  07/15/2018   IR REMOVAL TUN ACCESS W/ PORT W/O FL MOD SED  11/24/2018   TONSILECTOMY, ADENOIDECTOMY, BILATERAL MYRINGOTOMY AND TUBES Bilateral 1957   Social History:   reports that she has never smoked. She has never used smokeless tobacco. She reports that she does not drink alcohol and does not use drugs.  Family History  Problem Relation Age of Onset   Hypertension Father    Diabetes Father    Coronary artery disease Father    Hyperlipidemia Father    Colon cancer Father        39's   Hypertension Mother    Arthritis Mother    Cancer Maternal Aunt        type unk   Ovarian cancer Cousin 34   Cancer Cousin        mouth  Medications: Patient's Medications  New Prescriptions   No medications on file  Previous Medications   ALENDRONATE (FOSAMAX) 70 MG TABLET    Take one every week with a full glass of water on an empty stomach   ASCORBIC ACID (VITAMIN C PO)    Take 500 mg by mouth daily.   BIOTIN 36144 MCG TABS    Take 1 tablet by mouth daily.   BLACK COHOSH PO    Take 1 capsule by mouth daily.   CALCIUM-MAGNESIUM-VITAMIN D (CALCIUM 1200+D3 PO)       DIPHENHYDRAMINE (BENADRYL) 25 MG TABLET    Take 25 mg by mouth at bedtime.   GLUCOSAMINE HCL (CVS GLUCOSAMINE PO)    2 tabs by mouth in the morning and 1 tab by mouth at night   LOSARTAN-HYDROCHLOROTHIAZIDE (HYZAAR) 50-12.5 MG TABLET     TAKE 1 TABLET EVERY DAY   METOPROLOL TARTRATE (LOPRESSOR) 50 MG TABLET    TAKE 1 TABLET (50 MG TOTAL) BY MOUTH 2 (TWO) TIMES DAILY.   MULTIPLE VITAMIN (MULTIVITAMIN) TABLET    Take 1 tablet by mouth 2 (two) times daily.    OMEGA-3 FATTY ACIDS (OMEGA-3 FISH OIL PO)    Take 1 tablet by mouth 2 (two) times daily.   OMEPRAZOLE (PRILOSEC) 20 MG CAPSULE    TAKE 1 CAPSULE EVERY DAY   PROBIOTIC PRODUCT (PROBIOTIC-10 PO)       SIMVASTATIN (ZOCOR) 20 MG TABLET    TAKE 1 TABLET EVERY DAY  Modified Medications   No medications on file  Discontinued Medications   MELATONIN 10 MG CAPS    Take 1 tablet by mouth at bedtime.     Physical Exam:  Vitals:   10/02/21 0942  BP: 138/86  Pulse: (!) 56  Temp: 97.8 F (36.6 C)  TempSrc: Temporal  SpO2: 96%  Weight: 161 lb (73 kg)  Height: 4\' 10"  (1.473 m)   Body mass index is 33.65 kg/m. Wt Readings from Last 3 Encounters:  10/02/21 161 lb (73 kg)  05/25/21 161 lb 9.6 oz (73.3 kg)  04/26/21 160 lb 12.8 oz (72.9 kg)    Physical Exam Constitutional:      General: She is not in acute distress.    Appearance: She is well-developed. She is not diaphoretic.  HENT:     Head: Normocephalic and atraumatic.     Mouth/Throat:     Pharynx: No oropharyngeal exudate.  Eyes:     Conjunctiva/sclera: Conjunctivae normal.     Pupils: Pupils are equal, round, and reactive to light.  Cardiovascular:     Rate and Rhythm: Normal rate and regular rhythm.     Heart sounds: Normal heart sounds.  Pulmonary:     Effort: Pulmonary effort is normal.     Breath sounds: Normal breath sounds.  Abdominal:     General: Bowel sounds are normal.     Palpations: Abdomen is soft.  Musculoskeletal:     Cervical back: Normal range of motion and neck supple.     Right lower leg: No edema.     Left lower leg: No edema.  Skin:    General: Skin is warm and dry.  Neurological:     Mental Status: She is alert.  Psychiatric:        Mood and Affect: Mood normal.    Labs  reviewed: Basic Metabolic Panel: Recent Labs    09/21/21 0952  NA 139  K 3.9  CL 101  CO2 29  GLUCOSE 97  BUN  20  CREATININE 1.17*  CALCIUM 9.8   Liver Function Tests: Recent Labs    09/21/21 0952  AST 25  ALT 26  BILITOT 0.9  PROT 7.1   No results for input(s): LIPASE, AMYLASE in the last 8760 hours. No results for input(s): AMMONIA in the last 8760 hours. CBC: Recent Labs    09/21/21 0952  WBC 5.0  NEUTROABS 2,885  HGB 15.2  HCT 44.8  MCV 100.2*  PLT 303   Lipid Panel: Recent Labs    09/21/21 0952  CHOL 140  HDL 47*  LDLCALC 66  TRIG 196*  CHOLHDL 3.0   TSH: No results for input(s): TSH in the last 8760 hours. A1C: Lab Results  Component Value Date   HGBA1C 6.0 (H) 09/21/2021     Assessment/Plan 1. Essential hypertension, benign -well controlled, due to worsening renal function will stop HCTZ and continue to monitor bp, dash diet encouraged.  - losartan (COZAAR) 50 MG tablet; Take 1 tablet (50 mg total) by mouth daily.  Dispense: 90 tablet; Refill: 0  2. Gastroesophageal reflux disease, unspecified whether esophagitis present -stable on omeprazole 20 mg daily  3. Primary insomnia -ongoing, taking diphenhydramine at bedtime. Education on the adverse effects on medication (on beers list as well) discussed lifestyle modifications. Also alternative medications can be used without the side effects if interested.   4. Allergic sinusitis Stable at this time.   5. Dyslipidemia -stable on zocor, continue medication with dietary modifications.   6. History of ovarian cancer Continues to be monitored by oncologist.   7. Primary osteoarthritis of left knee Ongoing, discussed importance of exercises, strength training and weight loss to improve pain. Also can use tylenol PRN, followed by orthopedic  8. Hyperglycemia -dietary modifications encouraged  9. Obesity  --education provided on healthy weight loss through increase in physical activity and  proper nutrition    Next appt: 11/06/2021 to follow up BP Kumiko Fishman K. [place], [facility] [phone]

## 2021-10-03 ENCOUNTER — Telehealth: Payer: Self-pay

## 2021-10-03 ENCOUNTER — Telehealth: Payer: Self-pay | Admitting: Orthopedic Surgery

## 2021-10-03 NOTE — Telephone Encounter (Signed)
Received call from pt this morning needing to schedule follow up appt with Dr. Berline Lopes. Appt scheduled for 11/03/2021 at 3:45 pm. Pt agreeable to date and time of appt.

## 2021-10-03 NOTE — Telephone Encounter (Signed)
Pt called about gel injection appt. Please call pt at [phone].

## 2021-10-04 ENCOUNTER — Telehealth: Payer: Self-pay

## 2021-10-04 NOTE — Telephone Encounter (Signed)
Talked with patient and advised her submission for new VOB was sent to her insurance, due to authorization expiring on 1/4/202 from previous submission. Patient voiced that she understands.

## 2021-10-04 NOTE — Telephone Encounter (Signed)
VOB submitted for Monovisc, left knee. BV pending.

## 2021-10-06 ENCOUNTER — Telehealth: Payer: Self-pay

## 2021-10-06 NOTE — Telephone Encounter (Signed)
Approved for Monovisc, left knee. [place] Patient will be responsible for 20% OOP. Co-pay of $20.00 No PA required  Appt.10/18/2021 with Dr. Marlou Sa

## 2021-10-18 ENCOUNTER — Ambulatory Visit: Payer: Medicare HMO | Admitting: Orthopedic Surgery

## 2021-10-18 ENCOUNTER — Other Ambulatory Visit: Payer: Self-pay

## 2021-10-18 ENCOUNTER — Encounter: Payer: Self-pay | Admitting: Orthopedic Surgery

## 2021-10-18 DIAGNOSIS — M1712 Unilateral primary osteoarthritis, left knee: Secondary | ICD-10-CM

## 2021-10-18 MED ORDER — LIDOCAINE HCL 1 % IJ SOLN
5.0000 mL | INTRAMUSCULAR | Status: AC | PRN
  Administered 2021-10-18: 5 mL

## 2021-10-18 MED ORDER — HYALURONAN 88 MG/4ML IX SOSY
88.0000 mg | PREFILLED_SYRINGE | INTRA_ARTICULAR | Status: AC | PRN
  Administered 2021-10-18: 88 mg via INTRA_ARTICULAR

## 2021-10-18 NOTE — Progress Notes (Signed)
° °  Procedure Note  Patient: Melissa Simpson             Date of Birth: [birth year]           MRN: [id number]             Visit Date: 10/18/2021  Procedures: Visit Diagnoses:  1. Arthritis of left knee     Large Joint Inj: L knee on 10/18/2021 11:49 AM Indications: pain, joint swelling and diagnostic evaluation Details: 18 G 1.5 in needle, superolateral approach  Arthrogram: No  Medications: 5 mL lidocaine 1 %; 88 mg Hyaluronan 88 MG/4ML Outcome: tolerated well, no immediate complications Procedure, treatment alternatives, risks and benefits explained, specific risks discussed. Consent was given by the patient. Immediately prior to procedure a time out was called to verify the correct patient, procedure, equipment, support staff and site/side marked as required. Patient was prepped and draped in the usual sterile fashion.

## 2021-10-19 ENCOUNTER — Other Ambulatory Visit: Payer: Self-pay | Admitting: Nurse Practitioner

## 2021-11-02 ENCOUNTER — Other Ambulatory Visit: Payer: Self-pay | Admitting: Gynecologic Oncology

## 2021-11-02 ENCOUNTER — Encounter: Payer: Self-pay | Admitting: Gynecologic Oncology

## 2021-11-02 DIAGNOSIS — C562 Malignant neoplasm of left ovary: Secondary | ICD-10-CM

## 2021-11-02 NOTE — Progress Notes (Signed)
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 Large pelvic mass with heterogeneous appearance, measuring at least 15 centimeters in diameter. Although the findings favor enlarged uterus/fibroids, ovaries are not well seen and an ovarian mass should also be considered. Further characterization with pelvic ultrasound is recommended. 2. The bladder is completely decompressed. There is LEFT-sided hydronephrosis secondary to extrinsic compression from the pelvic mass. 3. Irregular thickening of the sigmoid colon, suspicious for colonic wall mass, measuring 1.3 centimeters. In this patient with a family history of colon cancer, further evaluation with colonoscopy is recommended. 4. Colonic diverticula without acute diverticulitis. 5. Moderate hiatal hernia.     05/15/2018 Imaging   MRI pelvis 16 cm complex cystic and solid mass which is centered in the left posterior adnexa and is contiguous with the uterus. This is suspicious for ovarian carcinoma, with differential diagnosis of atypical pedunculated fibroid with peripheral cystic degeneration. Surgical evaluation is recommended.   Other uterine fibroids measuring up to 4.8 cm.   No evidence of pelvic metastatic disease.     05/16/2018 Imaging   US pelvis 1. Large pelvic mass described in detail on MRI examination 1 day prior. 2. Slight ureteral prominence bilaterally. Mild fullness of the left renal collecting system. No obstructing focus evident. Suspect fullness of these structures due to  ureteral compression by the sizable pelvic mass.     05/22/2018 Tumor Marker   Patient's tumor was tested for the following markers: CA-125 Results of the tumor marker test revealed 122   05/27/2018 Surgery   Preoperative Diagnoses: Pelvic mass  Postoperative Diagnoses: left fallopian tube and ovary consistent with high-grade carcinoma of likely GYN origin. No evidence of metastatic disease.  Procedures: Exploratory laparotomy, total abdominal hysterectomy, bilateral salpingo-oophorectomy, peritoneal biopsies for ovarian cancer staging, bilateral pelvic lymphadenectomy, bilateral para-aortic lymphadenectomy, infracolic omentectomy, R0 resection   Surgeon: Marti Sleigh, MD  Assistants: Tona Sensing, MD - Fellow, Haroldine Laws, MD - Resident, Feliberto Harts, PA-S  Findings: On BME, the cervix is retracted anteriorly with no palpable adnexal fullness. There is no RV septum nodularity. Abdominal exam reveals a mobile large mass that extends to the umbilicus and nearly to the pelvic side walls. On abdominal entry, there was immediate green-brown pelvic fluid that appeared consistent with a spontaneous rupture of the cystic fluid-filled mass. The left ovary was a large cystic complex mass, roughly 15cm in size with smooth surface but containing friable tumor. The mass was densely adherent along the entire posterior wall of the uterus. The uterus had some small intramural fibroids and was roughly 10cm in total size. The right fallopian tube and ovary were normal appearing. The patient has smooth paracolic gutters, liver edge, diaphragm, spleen and stomach. The omentum was retracted but no evidence of nodularity or gross tumor. The anterior cul-de-sac was clear and the posterior-cul-de-sac peritoneum was clear, but the left ovarian mass wall was adherent to the rectosigmoid and cul-de-sac. There were no palpable pelvic or para-aortic adenopathy.   Of note, an the conclusion of the case, there  was mild RIGHT  hydronephrosis noted. The ureter was traced from the level of the right common iliac into the anterior utero-vesical ligament. There was an area at the pelvic brim were the hydronephrosis resolved to normal caliber. There was no tethering, stricture, or apparent injury to the ureter. Based on exam, this seemed consistent with packing and retraction causing some transient dilation that resolved with packing and retractor removal.   Intraoperative frozen section of the left ovary was consistent with high-grade carcinoma of GYN origin, possibly serous. There was no gross evidence of disease at the conclusion of the case, R0 resection.  Specimens: 1) Pelvic washings 2) Uterus with cervix  3) Left ovary and fallopian tube, IOFS c/w high-grade GYN carcinoma 4) Right fallopian tube and ovary 5) Peritoneal biopsies - anterior cul-de-sac peritoneum, posterior cul-de-sac peritoneum, left pelvic side wall peritoneum, right pelvic side wall peritoneum 6) Right and left pelvic lymph nodes 7) Right and left para-aortic lymph nodes 8) Omentum 9) Anaerobic/Aerobic culture - preliminary read is no organisms    05/27/2018 Pathology Results   A: Ovary and fallopian tube, left, salpingo-oophorectomy - Clear cell carcinoma of left ovary, size 14.0 cm, with necrosis  - Carcinoma is adherent to fallopian tube, consistent with surface involvement (stage pT1c2) - Focal endometriosis present - See synoptic report and comment  B: Uterus with cervix and right ovary and fallopian tube, hysterectomy and right salpingo-oophorectomy Cervix: Benign with Nabothian cysts Endometrium: Endometrial polyp (size 3.5 cm); inactive endometrium with cystic glandular changes Myometrium: Leiomyomata with hyalinization and calcification (size up to 4.2 cm); focal adenomyosis  Right ovary: Benign physiologic changes Right fallopian tube: Small paratubal cyst and no malignancy identified  C: Lymph nodes, right pelvic,  regional resection - Nine lymph nodes with no metastatic carcinoma identified (0/9)  D: Lymph nodes, left pelvic, regional resection - Five lymph nodes with no metastatic carcinoma identified (0/5)  E: Cul-de-sac, anterior, biopsy - Fibroadipose tissue with no carcinoma identified  F: Cul-de-sac, posterior, biopsy - Fibrous tissue with inflammation and no carcinoma identified  G: Pelvic sidewall, right, biopsy - Fibroadipose tissue with focal crushed CD10 positive cells, cannot exclude endometrial type stroma - No carcinoma identified  H: Pelvic sidewall, left, biopsy - Fibroadipose tissue with focal crushed cells, cannot exclude endometrial type stroma - No carcinoma identified  I: Omentum, omentectomy - Adipose and fibrovascular tissue consistent with omentum - No carcinoma identified  J: Lymph nodes, left periaortic, regional resection - Two small lymph nodes with no metastatic carcinoma identified (0/2)  K: Lymph nodes, right periaortic, regional resection - Three small lymph nodes with no metastatic carcinoma identified (0/3)  This electronic signature is attestation that the pathologist personally reviewed the submitted material(s) and the final diagnosis reflects that evaluation.     Synoptic Report     OVARY or FALLOPIAN TUBE or PRIMARY PERITONEUM  (Ovary FT Perit - All Specimens)    SPECIMEN    Procedure:    Total hysterectomy and bilateral salpingo-oophorectomy     Procedure:    Omentectomy     Procedure:    Peritoneal  biopsies     Procedure:    Peritoneal washing   :        Specimen Integrity of Left Ovary:    Received largely intact with multiple areas of disruption   Tumor Site:    Left ovary   Histologic Type:    Clear cell carcinoma   Histologic Grade:    High grade   :  Tumor Size:    Greatest dimension in Centimeters (cm): 14.0 Centimeters (cm)      Additional Dimension in Centimeters (cm):    10 Centimeters (cm)      Additional Dimension in  Centimeters (cm):    7 Centimeters (cm)  Tumor Extent:        Ovarian Surface Involvement:    Present       Laterality:    Left     Other Tissue / Organ Involvement:    Not identified     Peritoneal Ascitic Fluid:    Negative for malignancy (normal / benign)     Pleural Fluid:    Not submitted / unknown   Accessory Findings:      LYMPH NODES  Regional Lymph Nodes:    All lymph nodes negative for tumor cells   Number of Lymph Nodes Examined:    19     Site(s):    Right pelvic: 9     Site(s):    Left pelvic: 5     Site(s):    Right para-aortic: 3     Site(s):    Left para-aortic: 2   PATHOLOGIC STAGE CLASSIFICATION (pTNM, AJCC 8th Edition)  Primary Tumor (pT):    pT1c2   Regional Lymph Nodes (pN):    pN0   FIGO STAGE  FIGO Stage:    IC2   ADDITIONAL FINDINGS  Additional Pathologic Findings:    Endometriosis   Additional Pathologic Findings:    Benign fallopian tube with adherence to carcinoma; also see additional parts   Comment(s)  Comment(s):    Also see diagnosis comment.      The frozen section diagnosis is confirmed for specimen A. Sections demonstrate a carcinoma with tubulocystic and papillary architecture with hyalinized cores, lined by cells with high grade nuclei, clear to eosinophilic cytoplasm, and nuclear hobnailing.  The morphologic features are most suggestive of clear cell carcinoma.  Immunohistochemical stains are performed, and demonstrate that the carcinoma is positive for CK7 and PAX8, consistent with gynecologic origin. It has patchy positivity for Napsin A, wild type p53 staining, and is negative for ER, supporting the diagnosis of clear cell carcinoma.   Sections of the tumor demonstrate carcinoma with adherence to fallopian tube surface, consistent with surface involvement.  Per the operative note, findings were also suggestive of preoperative rupture of the mass.  Given both these findings, the tumor is staged as a pT1c2 (FIGO IC2)            05/27/2018  Genetic Testing   Patient has genetic testing done for genetic disorder Results revealed patient has the following mutation(s): BRCA 1 on tissue block, neg blood   06/04/2018 Procedure   She had port placement   06/05/2018 Cancer Staging   Staging form: Ovary, Fallopian Tube, and Primary Peritoneal Carcinoma, AJCC 8th Edition - Pathologic: FIGO Stage IC2, calculated as Stage IC (pT1c2, pN0, cM0) - Signed by Heath Lark, MD on 06/05/2018    06/27/2018 Imaging   1. No evidence of residual/recurrent tumor in the pelvis. 2. No evidence of metastatic disease in the chest, abdomen or pelvis. 3. Left pelvic sidewall 6.3 x 5.0 cm simple fluid collection, compatible with postsurgical seroma or lymphocele. 4. Small hiatal hernia. 5.  Aortic Atherosclerosis (ICD10-I70.0).   06/27/2018 Tumor Marker   Patient's tumor was tested for the following markers: CA-125 Results of the tumor marker test revealed 90.3   07/01/2018 - 10/14/2018 Chemotherapy   The patient had palonosetron (ALOXI) injection 0.25  mg, 0.25 mg, Intravenous,  Once, 6 of 6 cycles  Administration: 0.25 mg (07/01/2018), 0.25 mg (07/22/2018), 0.25 mg (08/12/2018), 0.25 mg (09/02/2018), 0.25 mg (09/23/2018), 0.25 mg (10/14/2018)    CARBOplatin (PARAPLATIN) 420 mg in sodium chloride 0.9 % 250 mL chemo infusion, 420 mg (98.3 % of original dose 425.4 mg), Intravenous,  Once, 6 of 6 cycles  Dose modification:   (original dose 425.4 mg, Cycle 1), 380 mg (original dose 425.4 mg, Cycle 5, Reason: Dose not tolerated), 380 mg (original dose 425.4 mg, Cycle 6, Reason: Dose not tolerated)  Administration: 420 mg (07/01/2018), 440 mg (07/22/2018), 380 mg (08/12/2018), 440 mg (09/02/2018), 380 mg (09/23/2018), 380 mg (10/14/2018)    PACLitaxel (TAXOL) 270 mg in sodium chloride 0.9 % 250 mL chemo infusion (> 11m/m2), 175 mg/m2 = 270 mg, Intravenous,  Once, 6 of 6 cycles  Administration: 270 mg (07/01/2018), 270 mg (07/22/2018), 270 mg (08/12/2018), 270 mg  (09/02/2018), 270 mg (09/23/2018), 270 mg (10/14/2018)    fosaprepitant (EMEND) 150 mg, dexamethasone (DECADRON) 12 mg in sodium chloride 0.9 % 145 mL IVPB, , Intravenous,  Once, 6 of 6 cycles  Administration:  (07/01/2018),  (07/22/2018),  (08/12/2018),  (09/02/2018),  (09/23/2018),  (10/14/2018)  for chemotherapy treatment.     07/15/2018 Imaging   [place] consistent with acute deep vein thrombosis involving the right internal jugular veins. She is placed on Xarelto   07/21/2018 Tumor Marker   Patient's tumor was tested for the following markers: CA-125 Results of the tumor marker test revealed 22.8   09/22/2018 Tumor Marker   Patient's tumor was tested for the following markers: CA-125 Results of the tumor marker test revealed 14.5   11/10/2018 Imaging   Status post hysterectomy and bilateral salpingo-oophorectomy.   4.9 cm improving postoperative seroma along the left pelvic sidewall.   No evidence of recurrent or metastatic disease.     11/10/2018 Tumor Marker   Patient's tumor was tested for the following markers: CA-125 Results of the tumor marker test revealed 12.6   11/24/2018 Procedure   Successful removal of implanted Port-A-Cath.     Interval History: Patient reports doing well.  She endorses regular bowel and bladder function.  Denies any abdominal or pelvic pain.  Notes that joint pain and back pain are at baseline.  Denies any vaginal bleeding or discharge.  Denies any significant change to appetite or weight.  Past Medical/Surgical History: Past Medical History:  Diagnosis Date   [place]   Arthritis    in bilateral knees   Cancer ([place]    left ovarian cancer   Family history of colon cancer    Family history of ovarian cancer    GERD (gastroesophageal reflux disease)    History of DVT (deep vein thrombosis) 2019   had a catheter-related DVT during chemotherapy   Hyperlipidemia    Hypertension    Steroid  myopathy 09/22/2018    Past Surgical History:  Procedure Laterality Date   ABDOMINAL HYSTERECTOMY     BIOPSY THYROID  2008   COLONOSCOPY     IR CV LINE INJECTION  07/15/2018   IR REMOVAL TUN ACCESS W/ PORT W/O FL MOD SED  11/24/2018   TONSILECTOMY, ADENOIDECTOMY, BILATERAL MYRINGOTOMY AND TUBES Bilateral 1957    Family History  Problem Relation Age of Onset   Hypertension Father    Diabetes Father    Coronary artery disease Father    Hyperlipidemia Father    Colon cancer Father  36's   Hypertension Mother    Arthritis Mother    Cancer Maternal Aunt        type unk   Ovarian cancer Cousin 58   Cancer Cousin        mouth    Social History   Socioeconomic History   Marital status: Married    Spouse name: Louie Casa   Number of children: 3   Years of education: Not on file   Highest education level: Not on file  Occupational History   Occupation: retired Solicitor  Tobacco Use   Smoking status: Never   Smokeless tobacco: Never  Vaping Use   Vaping Use: Never used  Substance and Sexual Activity   Alcohol use: No   Drug use: No   Sexual activity: Not on file  Other Topics Concern   Not on file  Social History Narrative   Diet:Unrestricted   Do you drink/eat things with caffeine? Rarely   Marital status: Married                             What year were you married? 1992   Do you live in a house, apartment, assisted living, condo, trailer, etc)? House   Is it one or more stories? 1   How many persons live in your home? 3   Do you have any pets in your home?  2 small dogs   Current or past profession: Chief of Staff   Do you exercise?                                                     Type & how often:   Do you have a living will? No   Do you have a DNR Form? No   Do you have a POA/HPOA forms? NO   Social Determinants of Radio broadcast assistant Strain: Not on file  Food Insecurity: Not on file  Transportation Needs: Not on file  Physical  Activity: Not on file  Stress: Not on file  Social Connections: Not on file    Current Medications:  Current Outpatient Medications:    alendronate (FOSAMAX) 70 MG tablet, TAKE 1 TABLET EVERY WEEK WITH A FULL GLASS OF WATER ON AN EMPTY STOMACH, Disp: 12 tablet, Rfl: 1   Ascorbic Acid (VITAMIN C PO), Take 500 mg by mouth daily., Disp: , Rfl:    Aspirin 81 MG CAPS, , Disp: , Rfl:    Biotin 10000 MCG TABS, Take 1 tablet by mouth daily., Disp: , Rfl:    BLACK COHOSH PO, Take 1 capsule by mouth daily., Disp: , Rfl:    Calcium-Magnesium-Vitamin D (CALCIUM 1200+D3 PO), , Disp: , Rfl:    diphenhydrAMINE (BENADRYL) 25 MG tablet, Take 25 mg by mouth at bedtime., Disp: , Rfl:    Glucosamine HCl ([employer] GLUCOSAMINE PO), 2 tabs by mouth in the morning and 1 tab by mouth at night, Disp: , Rfl:    losartan (COZAAR) 50 MG tablet, Take 1 tablet (50 mg total) by mouth daily., Disp: 90 tablet, Rfl: 0   metoprolol tartrate (LOPRESSOR) 50 MG tablet, TAKE 1 TABLET (50 MG TOTAL) BY MOUTH 2 (TWO) TIMES DAILY., Disp: 180 tablet, Rfl: 3   Multiple Vitamin (MULTIVITAMIN) tablet, Take 1 tablet by mouth 2 (two)  times daily. , Disp: , Rfl:    Omega-3 Fatty Acids (OMEGA-3 FISH OIL PO), Take 1 tablet by mouth 2 (two) times daily., Disp: , Rfl:    omeprazole (PRILOSEC) 20 MG capsule, TAKE 1 CAPSULE EVERY DAY, Disp: 90 capsule, Rfl: 3   Probiotic Product (PROBIOTIC-10 PO), , Disp: , Rfl:    simvastatin (ZOCOR) 20 MG tablet, TAKE 1 TABLET EVERY DAY, Disp: 90 tablet, Rfl: 0  Review of Systems: Pertinent positives include ringing in ears, cough, joint pain, back pain. Denies appetite changes, fevers, chills, fatigue, unexplained weight changes. Denies hearing loss, neck lumps or masses, mouth sores or voice changes. Denies wheezing.  Denies shortness of breath. Denies chest pain or palpitations. Denies leg swelling. Denies abdominal distention, pain, blood in stools, constipation, diarrhea, nausea, vomiting, or early  satiety. Denies pain with intercourse, dysuria, frequency, hematuria or incontinence. Denies hot flashes, pelvic pain, vaginal bleeding or vaginal discharge.   Denies muscle pain/cramps. Denies itching, rash, or wounds. Denies dizziness, headaches, numbness or seizures. Denies swollen lymph nodes or glands, denies easy bruising or bleeding. Denies anxiety, depression, confusion, or decreased concentration.  Physical Exam: BP (!) 162/91 (BP Location: Left Arm, Patient Position: Sitting)    Pulse (!) 56    Temp 98.5 F (36.9 C) (Oral)    Resp 18    Ht 4' 10.66" (1.49 m)    Wt 159 lb 3.2 oz (72.2 kg)    SpO2 98%    BMI 32.53 kg/m  General: Alert, oriented, no acute distress. HEENT: Normocephalic, atraumatic, sclera anicteric. Chest: Clear to auscultation bilaterally.  No wheezes or rhonchi. Cardiovascular: Regular rate and rhythm, no murmurs. Abdomen: Obese, soft, nontender.  Normoactive bowel sounds.  No masses or hepatosplenomegaly appreciated.  Well-healed incisions. Extremities: Grossly normal range of motion.  Warm, well perfused.  No edema bilaterally. Skin: No rashes or lesions noted. Lymphatics: No cervical, supraclavicular, or inguinal adenopathy. GU: Normal appearing external genitalia without erythema, excoriation, or lesions.  Speculum exam reveals atrophic vaginal mucosa, no lesions or masses.  Bimanual exam reveals cuff intact, no nodularity or masses.  Rectovaginal exam  confirms these findings.  Laboratory & Radiologic Studies: None new  Assessment & Plan: LANEE CHAIN is a 71 y.o. woman with Stage IC2 status post staging surgery in 05/2018 and completion of adjuvant chemotherapy in 10/2018 who presents for surveillance.   The patient is doing very well and continues to be NED on exam.  She had her CA-125 drawn today and it will release to her tomorrow in [place].   She is now 3 years out from completion of adjuvant therapy. We will continue with visits every 6 months  until 5 years.  She will come back and see me in mid August.  She knows to call closer to the Summer to get that visit scheduled.  We discussed signs and symptoms that if she were to develop should prompt a phone call to be seen sooner.  28 minutes of total time was spent for this patient encounter, including preparation, face-to-face counseling with the patient and coordination of care, and documentation of the encounter.  Jeral Pinch, MD  Division of Gynecologic Oncology  Department of Obstetrics and Gynecology  [facility] [employer]

## 2021-11-03 ENCOUNTER — Other Ambulatory Visit: Payer: Self-pay

## 2021-11-03 ENCOUNTER — Inpatient Hospital Stay: Payer: Medicare HMO | Attending: Gynecologic Oncology

## 2021-11-03 ENCOUNTER — Encounter: Payer: Self-pay | Admitting: Gynecologic Oncology

## 2021-11-03 ENCOUNTER — Inpatient Hospital Stay (HOSPITAL_BASED_OUTPATIENT_CLINIC_OR_DEPARTMENT_OTHER): Payer: Medicare HMO | Admitting: Gynecologic Oncology

## 2021-11-03 VITALS — BP 162/91 | HR 56 | Temp 98.5°F | Resp 18 | Ht 58.66 in | Wt 159.2 lb

## 2021-11-03 DIAGNOSIS — Z90710 Acquired absence of both cervix and uterus: Secondary | ICD-10-CM | POA: Diagnosis not present

## 2021-11-03 DIAGNOSIS — C562 Malignant neoplasm of left ovary: Secondary | ICD-10-CM

## 2021-11-03 DIAGNOSIS — Z8543 Personal history of malignant neoplasm of ovary: Secondary | ICD-10-CM | POA: Insufficient documentation

## 2021-11-03 DIAGNOSIS — Z148 Genetic carrier of other disease: Secondary | ICD-10-CM | POA: Diagnosis not present

## 2021-11-03 DIAGNOSIS — Z90722 Acquired absence of ovaries, bilateral: Secondary | ICD-10-CM | POA: Diagnosis not present

## 2021-11-03 DIAGNOSIS — Z9221 Personal history of antineoplastic chemotherapy: Secondary | ICD-10-CM | POA: Diagnosis not present

## 2021-11-03 NOTE — Patient Instructions (Addendum)
It was good to see you today.  I do not see or feel any evidence of cancer recurrence on your exam.  Your CA-125 will release to you on Tuesday after your Monday lab appointment.  I will plan to see you for follow-up in 6 months.  Since my schedule is not out past the end of June, please call back in June or July to get a visit scheduled to see me in August.  If you develop any symptoms concerning for cancer recurrence before then, please call to see me sooner.

## 2021-11-04 LAB — CA 125: Cancer Antigen (CA) 125: 16.2 U/mL (ref 0.0–38.1)

## 2021-11-06 ENCOUNTER — Ambulatory Visit (INDEPENDENT_AMBULATORY_CARE_PROVIDER_SITE_OTHER): Payer: Medicare HMO | Admitting: Nurse Practitioner

## 2021-11-06 ENCOUNTER — Encounter: Payer: Self-pay | Admitting: Nurse Practitioner

## 2021-11-06 ENCOUNTER — Telehealth: Payer: Self-pay

## 2021-11-06 ENCOUNTER — Other Ambulatory Visit: Payer: Self-pay

## 2021-11-06 VITALS — BP 132/80 | HR 53 | Temp 97.9°F | Ht <= 58 in | Wt 163.0 lb

## 2021-11-06 DIAGNOSIS — I10 Essential (primary) hypertension: Secondary | ICD-10-CM

## 2021-11-06 MED ORDER — LOSARTAN POTASSIUM 50 MG PO TABS: 50.0000 mg | ORAL_TABLET | Freq: Every day | ORAL | 1 refills | Status: DC

## 2021-11-06 NOTE — Telephone Encounter (Signed)
Spoke with Melissa Simpson this afternoon and reviewed her recent CA-125 result (16.2). Patient states she was able to see the result on my chart along with Dr. Lulu Riding comment. No questioned voiced. Instructed to call with any needs.

## 2021-11-06 NOTE — Progress Notes (Signed)
Careteam: Patient Care Team: Lauree Chandler, NP as PCP - General (Geriatric Medicine)  PLACE OF SERVICE:  [place] Directive information Does Patient Have a Medical Advance Directive?: Yes, Type of Advance Directive: [place];Living will, Does patient want to make changes to medical advance directive?: No - Patient declined  Allergies  Allergen Reactions   Pollen Extract     Chief Complaint  Patient presents with   Follow-up    4 week blood pressure follow-up      HPI: Patient is a 71 y.o. female for follow up on BP. Stopped HTCZ due to worsening renal function on last labs. She is also drinking more water.  Continues on losartan 50 mg daily with metoprolol 50 mg twice daily  No chest pains or shortness of breath  No LE edema  Review of Systems:  Review of Systems  Constitutional:  Negative for chills and fever.  Respiratory:  Negative for cough and shortness of breath.   Cardiovascular:  Negative for chest pain and leg swelling.   Past Medical History:  Diagnosis Date   AKI (acute kidney injury) ([place]) 05/23/2018   Arthritis    in bilateral knees   Cancer ([place])    left ovarian cancer   Family history of colon cancer    Family history of ovarian cancer    GERD (gastroesophageal reflux disease)    History of DVT (deep vein thrombosis) 2019   had a catheter-related DVT during chemotherapy   Hyperlipidemia    Hypertension    Steroid myopathy 09/22/2018   Past Surgical History:  Procedure Laterality Date   ABDOMINAL HYSTERECTOMY     BIOPSY THYROID  2008   COLONOSCOPY     IR CV LINE INJECTION  07/15/2018   IR REMOVAL TUN ACCESS W/ PORT W/O FL MOD SED  11/24/2018   TONSILECTOMY, ADENOIDECTOMY, BILATERAL MYRINGOTOMY AND TUBES Bilateral 1957   Social History:   reports that she has never smoked. She has never used smokeless tobacco. She reports that she does not drink alcohol and does not use drugs.  Family History  Problem  Relation Age of Onset   Hypertension Father    Diabetes Father    Coronary artery disease Father    Hyperlipidemia Father    Colon cancer Father        39's   Hypertension Mother    Arthritis Mother    Cancer Maternal Aunt        type unk   Ovarian cancer Cousin 75   Cancer Cousin        mouth    Medications: Patient's Medications  New Prescriptions   No medications on file  Previous Medications   ALENDRONATE (FOSAMAX) 70 MG TABLET    TAKE 1 TABLET EVERY WEEK WITH A FULL GLASS OF WATER ON AN EMPTY STOMACH   ASCORBIC ACID (VITAMIN C PO)    Take 500 mg by mouth daily.   ASPIRIN 81 MG CAPS       BIOTIN 76195 MCG TABS    Take 1 tablet by mouth daily.   BLACK COHOSH PO    Take 1 capsule by mouth daily.   CALCIUM-MAGNESIUM-VITAMIN D (CALCIUM 1200+D3 PO)       DIPHENHYDRAMINE (BENADRYL) 25 MG TABLET    Take 25 mg by mouth at bedtime.   GLUCOSAMINE HCL ([facility] GLUCOSAMINE PO)    2 tabs by mouth in the morning and 1 tab by mouth at night   LOSARTAN (  COZAAR) 50 MG TABLET    Take 1 tablet (50 mg total) by mouth daily.   METOPROLOL TARTRATE (LOPRESSOR) 50 MG TABLET    TAKE 1 TABLET (50 MG TOTAL) BY MOUTH 2 (TWO) TIMES DAILY.   MULTIPLE VITAMIN (MULTIVITAMIN) TABLET    Take 1 tablet by mouth 2 (two) times daily.    OMEGA-3 FATTY ACIDS (OMEGA-3 FISH OIL PO)    Take 1 tablet by mouth 2 (two) times daily.   OMEPRAZOLE (PRILOSEC) 20 MG CAPSULE    TAKE 1 CAPSULE EVERY DAY   PROBIOTIC PRODUCT (PROBIOTIC-10 PO)       SIMVASTATIN (ZOCOR) 20 MG TABLET    TAKE 1 TABLET EVERY DAY  Modified Medications   No medications on file  Discontinued Medications   No medications on file    Physical Exam:  Vitals:   11/06/21 0916  BP: 132/80  Pulse: (!) 53  Temp: 97.9 F (36.6 C)  TempSrc: Temporal  SpO2: 95%  Weight: 163 lb (73.9 kg)  Height: 4\' 10"  (1.473 m)   Body mass index is 34.07 kg/m. Wt Readings from Last 3 Encounters:  11/06/21 163 lb (73.9 kg)  11/03/21 159 lb 3.2 oz (72.2 kg)   10/02/21 161 lb (73 kg)    Physical Exam Constitutional:      Appearance: Normal appearance.  Cardiovascular:     Rate and Rhythm: Normal rate and regular rhythm.  Pulmonary:     Effort: Pulmonary effort is normal.     Breath sounds: Normal breath sounds.  Neurological:     Mental Status: She is alert and oriented to person, place, and time. Mental status is at baseline.  Psychiatric:        Mood and Affect: Mood normal.    Labs reviewed: Basic Metabolic Panel: Recent Labs    09/21/21 0952  NA 139  K 3.9  CL 101  CO2 29  GLUCOSE 97  BUN 20  CREATININE 1.17*  CALCIUM 9.8   Liver Function Tests: Recent Labs    09/21/21 0952  AST 25  ALT 26  BILITOT 0.9  PROT 7.1   No results for input(s): LIPASE, AMYLASE in the last 8760 hours. No results for input(s): AMMONIA in the last 8760 hours. CBC: Recent Labs    09/21/21 0952  WBC 5.0  NEUTROABS 2,885  HGB 15.2  HCT 44.8  MCV 100.2*  PLT 303   Lipid Panel: Recent Labs    09/21/21 0952  CHOL 140  HDL 47*  LDLCALC 66  TRIG 196*  CHOLHDL 3.0   TSH: No results for input(s): TSH in the last 8760 hours. A1C: Lab Results  Component Value Date   HGBA1C 6.0 (H) 09/21/2021     Assessment/Plan 1. Essential hypertension, benign -well controlled off hctz. She has increased hydration.  -will continue losartan and metoprolol.  - BASIC METABOLIC PANEL WITH GFR - losartan (COZAAR) 50 MG tablet; Take 1 tablet (50 mg total) by mouth daily.  Dispense: 90 tablet; Refill: 1  Return in about 6 months (around 05/06/2022) for routine follow up labs with appt. Carlos American. [place], [facility] [phone]

## 2021-11-06 NOTE — Addendum Note (Signed)
Addended by: Lauree Chandler on: 11/06/2021 12:03 PM   Modules accepted: Orders

## 2021-11-07 LAB — BASIC METABOLIC PANEL WITH GFR
BUN/Creatinine Ratio: 18 (calc) (ref 6–22)
BUN: 20 mg/dL (ref 7–25)
CO2: 25 mmol/L (ref 20–32)
Calcium: 9.8 mg/dL (ref 8.6–10.4)
Chloride: 103 mmol/L (ref 98–110)
Creat: 1.09 mg/dL — ABNORMAL HIGH (ref 0.60–1.00)
Glucose, Bld: 99 mg/dL (ref 65–139)
Potassium: 4.2 mmol/L (ref 3.5–5.3)
Sodium: 139 mmol/L (ref 135–146)
eGFR: 54 mL/min/{1.73_m2} — ABNORMAL LOW (ref >=60)

## 2021-11-24 ENCOUNTER — Telehealth: Payer: Self-pay | Admitting: *Deleted

## 2021-11-24 DIAGNOSIS — I10 Essential (primary) hypertension: Secondary | ICD-10-CM

## 2021-11-24 MED ORDER — LOSARTAN POTASSIUM 50 MG PO TABS: 50.0000 mg | ORAL_TABLET | Freq: Every day | ORAL | 1 refills | Status: DC

## 2021-11-24 NOTE — Telephone Encounter (Signed)
Received Fax from [facility] stating: Rx for Losartan/HCTZ 50/12.5 mg dated 09/07/2021. Active Rx for plain Losartan '50mg'$  dated 10/02/2021 (1/D at retail from you). Please Clarify current treatment: Losartan/HCTZ 50-12.'5mg'$  or Losartan '50mg'$ -Need new Rx. ? ? ?Per Janett Billow: Should be taking Losartan '50mg'$  by mouth daily.  ? ?Form faxed back to [facility] and sent new Rx to [facility].  ?

## 2021-12-28 ENCOUNTER — Other Ambulatory Visit: Payer: Self-pay | Admitting: Nurse Practitioner

## 2021-12-28 DIAGNOSIS — I10 Essential (primary) hypertension: Secondary | ICD-10-CM

## 2022-01-02 DIAGNOSIS — Z6834 Body mass index (BMI) 34.0-34.9, adult: Secondary | ICD-10-CM | POA: Diagnosis not present

## 2022-01-02 DIAGNOSIS — Z01419 Encounter for gynecological examination (general) (routine) without abnormal findings: Secondary | ICD-10-CM | POA: Diagnosis not present

## 2022-01-02 DIAGNOSIS — Z1231 Encounter for screening mammogram for malignant neoplasm of breast: Secondary | ICD-10-CM | POA: Diagnosis not present

## 2022-01-02 LAB — HM MAMMOGRAPHY

## 2022-01-03 ENCOUNTER — Encounter: Payer: Self-pay | Admitting: *Deleted

## 2022-02-05 ENCOUNTER — Other Ambulatory Visit: Payer: Self-pay | Admitting: Nurse Practitioner

## 2022-02-05 DIAGNOSIS — E781 Pure hyperglyceridemia: Secondary | ICD-10-CM

## 2022-02-09 ENCOUNTER — Ambulatory Visit
Admission: RE | Admit: 2022-02-09 | Discharge: 2022-02-09 | Disposition: A | Discharge status: Home / Self Care | Payer: Medicare HMO | Source: Ambulatory Visit | Attending: Nurse Practitioner | Admitting: Nurse Practitioner

## 2022-02-09 DIAGNOSIS — M8589 Other specified disorders of bone density and structure, multiple sites: Secondary | ICD-10-CM | POA: Diagnosis not present

## 2022-02-09 DIAGNOSIS — E2839 Other primary ovarian failure: Secondary | ICD-10-CM

## 2022-02-09 DIAGNOSIS — M810 Age-related osteoporosis without current pathological fracture: Secondary | ICD-10-CM | POA: Diagnosis not present

## 2022-02-09 DIAGNOSIS — Z780 Asymptomatic menopausal state: Secondary | ICD-10-CM | POA: Diagnosis not present

## 2022-04-13 ENCOUNTER — Telehealth: Payer: Self-pay | Admitting: *Deleted

## 2022-04-13 ENCOUNTER — Encounter: Payer: Self-pay | Admitting: Gynecologic Oncology

## 2022-04-13 NOTE — Telephone Encounter (Signed)
Patient called and scheduled a follow up appt with lab on 8/7

## 2022-04-16 ENCOUNTER — Encounter: Payer: Self-pay | Admitting: Gynecologic Oncology

## 2022-04-16 ENCOUNTER — Inpatient Hospital Stay (HOSPITAL_BASED_OUTPATIENT_CLINIC_OR_DEPARTMENT_OTHER): Payer: Medicare HMO | Admitting: Gynecologic Oncology

## 2022-04-16 ENCOUNTER — Other Ambulatory Visit: Payer: Self-pay

## 2022-04-16 ENCOUNTER — Inpatient Hospital Stay: Payer: Medicare HMO | Attending: Gynecologic Oncology

## 2022-04-16 VITALS — BP 132/88 | HR 82 | Temp 98.3°F | Resp 18 | Ht <= 58 in | Wt 160.1 lb

## 2022-04-16 DIAGNOSIS — Z9079 Acquired absence of other genital organ(s): Secondary | ICD-10-CM | POA: Diagnosis not present

## 2022-04-16 DIAGNOSIS — C562 Malignant neoplasm of left ovary: Secondary | ICD-10-CM

## 2022-04-16 DIAGNOSIS — Z8543 Personal history of malignant neoplasm of ovary: Secondary | ICD-10-CM

## 2022-04-16 DIAGNOSIS — Z9221 Personal history of antineoplastic chemotherapy: Secondary | ICD-10-CM

## 2022-04-16 DIAGNOSIS — M545 Low back pain, unspecified: Secondary | ICD-10-CM | POA: Diagnosis not present

## 2022-04-16 DIAGNOSIS — Z90722 Acquired absence of ovaries, bilateral: Secondary | ICD-10-CM

## 2022-04-16 DIAGNOSIS — Z90710 Acquired absence of both cervix and uterus: Secondary | ICD-10-CM

## 2022-04-16 DIAGNOSIS — M539 Dorsopathy, unspecified: Secondary | ICD-10-CM

## 2022-04-16 NOTE — Progress Notes (Signed)
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 Large pelvic mass with heterogeneous appearance, measuring at least 15 centimeters in diameter. Although the findings favor enlarged uterus/fibroids, ovaries are not well seen and an ovarian mass should also be considered. Further characterization with pelvic ultrasound is recommended. 2. The bladder is completely decompressed. There is LEFT-sided hydronephrosis secondary to extrinsic compression from the pelvic mass. 3. Irregular thickening of the sigmoid colon, suspicious for colonic wall mass, measuring 1.3 centimeters. In this patient with a family history of colon cancer, further evaluation with colonoscopy is recommended. 4. Colonic diverticula without acute diverticulitis. 5. Moderate hiatal hernia.     05/15/2018 Imaging   MRI pelvis 16 cm complex cystic and solid mass which is centered in the left posterior adnexa and is contiguous with the uterus. This is suspicious for ovarian carcinoma, with differential diagnosis of atypical pedunculated fibroid with peripheral cystic degeneration. Surgical evaluation is recommended.   Other uterine fibroids measuring up to 4.8 cm.   No evidence of pelvic metastatic disease.     05/16/2018 Imaging   US pelvis 1. Large pelvic mass described in detail on MRI examination 1 day prior. 2. Slight ureteral prominence bilaterally. Mild fullness of the left renal collecting system. No obstructing focus evident. Suspect fullness of these structures due to  ureteral compression by the sizable pelvic mass.     05/22/2018 Tumor Marker   Patient's tumor was tested for the following markers: CA-125 Results of the tumor marker test revealed 122   05/27/2018 Surgery   Preoperative Diagnoses: Pelvic mass  Postoperative Diagnoses: left fallopian tube and ovary consistent with high-grade carcinoma of likely GYN origin. No evidence of metastatic disease.  Procedures: Exploratory laparotomy, total abdominal hysterectomy, bilateral salpingo-oophorectomy, peritoneal biopsies for ovarian cancer staging, bilateral pelvic lymphadenectomy, bilateral para-aortic lymphadenectomy, infracolic omentectomy, R0 resection   Surgeon: Marti Sleigh, MD  Assistants: Tona Sensing, MD - Fellow, Haroldine Laws, MD - Resident, Feliberto Harts, PA-S  Findings: On BME, the cervix is retracted anteriorly with no palpable adnexal fullness. There is no RV septum nodularity. Abdominal exam reveals a mobile large mass that extends to the umbilicus and nearly to the pelvic side walls. On abdominal entry, there was immediate green-brown pelvic fluid that appeared consistent with a spontaneous rupture of the cystic fluid-filled mass. The left ovary was a large cystic complex mass, roughly 15cm in size with smooth surface but containing friable tumor. The mass was densely adherent along the entire posterior wall of the uterus. The uterus had some small intramural fibroids and was roughly 10cm in total size. The right fallopian tube and ovary were normal appearing. The patient has smooth paracolic gutters, liver edge, diaphragm, spleen and stomach. The omentum was retracted but no evidence of nodularity or gross tumor. The anterior cul-de-sac was clear and the posterior-cul-de-sac peritoneum was clear, but the left ovarian mass wall was adherent to the rectosigmoid and cul-de-sac. There were no palpable pelvic or para-aortic adenopathy.   Of note, an the conclusion of the case, there  was mild RIGHT  hydronephrosis noted. The ureter was traced from the level of the right common iliac into the anterior utero-vesical ligament. There was an area at the pelvic brim were the hydronephrosis resolved to normal caliber. There was no tethering, stricture, or apparent injury to the ureter. Based on exam, this seemed consistent with packing and retraction causing some transient dilation that resolved with packing and retractor removal.   Intraoperative frozen section of the left ovary was consistent with high-grade carcinoma of GYN origin, possibly serous. There was no gross evidence of disease at the conclusion of the case, R0 resection.  Specimens: 1) Pelvic washings 2) Uterus with cervix  3) Left ovary and fallopian tube, IOFS c/w high-grade GYN carcinoma 4) Right fallopian tube and ovary 5) Peritoneal biopsies - anterior cul-de-sac peritoneum, posterior cul-de-sac peritoneum, left pelvic side wall peritoneum, right pelvic side wall peritoneum 6) Right and left pelvic lymph nodes 7) Right and left para-aortic lymph nodes 8) Omentum 9) Anaerobic/Aerobic culture - preliminary read is no organisms    05/27/2018 Pathology Results   A: Ovary and fallopian tube, left, salpingo-oophorectomy - Clear cell carcinoma of left ovary, size 14.0 cm, with necrosis  - Carcinoma is adherent to fallopian tube, consistent with surface involvement (stage pT1c2) - Focal endometriosis present - See synoptic report and comment  B: Uterus with cervix and right ovary and fallopian tube, hysterectomy and right salpingo-oophorectomy Cervix: Benign with Nabothian cysts Endometrium: Endometrial polyp (size 3.5 cm); inactive endometrium with cystic glandular changes Myometrium: Leiomyomata with hyalinization and calcification (size up to 4.2 cm); focal adenomyosis  Right ovary: Benign physiologic changes Right fallopian tube: Small paratubal cyst and no malignancy identified  C: Lymph nodes, right pelvic,  regional resection - Nine lymph nodes with no metastatic carcinoma identified (0/9)  D: Lymph nodes, left pelvic, regional resection - Five lymph nodes with no metastatic carcinoma identified (0/5)  E: Cul-de-sac, anterior, biopsy - Fibroadipose tissue with no carcinoma identified  F: Cul-de-sac, posterior, biopsy - Fibrous tissue with inflammation and no carcinoma identified  G: Pelvic sidewall, right, biopsy - Fibroadipose tissue with focal crushed CD10 positive cells, cannot exclude endometrial type stroma - No carcinoma identified  H: Pelvic sidewall, left, biopsy - Fibroadipose tissue with focal crushed cells, cannot exclude endometrial type stroma - No carcinoma identified  I: Omentum, omentectomy - Adipose and fibrovascular tissue consistent with omentum - No carcinoma identified  J: Lymph nodes, left periaortic, regional resection - Two small lymph nodes with no metastatic carcinoma identified (0/2)  K: Lymph nodes, right periaortic, regional resection - Three small lymph nodes with no metastatic carcinoma identified (0/3)  This electronic signature is attestation that the pathologist personally reviewed the submitted material(s) and the final diagnosis reflects that evaluation.     Synoptic Report     OVARY or FALLOPIAN TUBE or PRIMARY PERITONEUM  (Ovary FT Perit - All Specimens)    SPECIMEN    Procedure:    Total hysterectomy and bilateral salpingo-oophorectomy     Procedure:    Omentectomy     Procedure:    Peritoneal  biopsies     Procedure:    Peritoneal washing   :        Specimen Integrity of Left Ovary:    Received largely intact with multiple areas of disruption   Tumor Site:    Left ovary   Histologic Type:    Clear cell carcinoma   Histologic Grade:    High grade   :  Tumor Size:    Greatest dimension in Centimeters (cm): 14.0 Centimeters (cm)      Additional Dimension in Centimeters (cm):    10 Centimeters (cm)      Additional Dimension in  Centimeters (cm):    7 Centimeters (cm)  Tumor Extent:        Ovarian Surface Involvement:    Present       Laterality:    Left     Other Tissue / Organ Involvement:    Not identified     Peritoneal Ascitic Fluid:    Negative for malignancy (normal / benign)     Pleural Fluid:    Not submitted / unknown   Accessory Findings:      LYMPH NODES  Regional Lymph Nodes:    All lymph nodes negative for tumor cells   Number of Lymph Nodes Examined:    19     Site(s):    Right pelvic: 9     Site(s):    Left pelvic: 5     Site(s):    Right para-aortic: 3     Site(s):    Left para-aortic: 2   PATHOLOGIC STAGE CLASSIFICATION (pTNM, AJCC 8th Edition)  Primary Tumor (pT):    pT1c2   Regional Lymph Nodes (pN):    pN0   FIGO STAGE  FIGO Stage:    IC2   ADDITIONAL FINDINGS  Additional Pathologic Findings:    Endometriosis   Additional Pathologic Findings:    Benign fallopian tube with adherence to carcinoma; also see additional parts   Comment(s)  Comment(s):    Also see diagnosis comment.      The frozen section diagnosis is confirmed for specimen A. Sections demonstrate a carcinoma with tubulocystic and papillary architecture with hyalinized cores, lined by cells with high grade nuclei, clear to eosinophilic cytoplasm, and nuclear hobnailing.  The morphologic features are most suggestive of clear cell carcinoma.  Immunohistochemical stains are performed, and demonstrate that the carcinoma is positive for CK7 and PAX8, consistent with gynecologic origin. It has patchy positivity for Napsin A, wild type p53 staining, and is negative for ER, supporting the diagnosis of clear cell carcinoma.   Sections of the tumor demonstrate carcinoma with adherence to fallopian tube surface, consistent with surface involvement.  Per the operative note, findings were also suggestive of preoperative rupture of the mass.  Given both these findings, the tumor is staged as a pT1c2 (FIGO IC2)            05/27/2018  Genetic Testing   Patient has genetic testing done for genetic disorder Results revealed patient has the following mutation(s): BRCA 1 on tissue block, neg blood   06/04/2018 Procedure   She had port placement   06/05/2018 Cancer Staging   Staging form: Ovary, Fallopian Tube, and Primary Peritoneal Carcinoma, AJCC 8th Edition - Pathologic: FIGO Stage IC2, calculated as Stage IC (pT1c2, pN0, cM0) - Signed by Heath Lark, MD on 06/05/2018   06/27/2018 Imaging   1. No evidence of residual/recurrent tumor in the pelvis. 2. No evidence of metastatic disease in the chest, abdomen or pelvis. 3. Left pelvic sidewall 6.3 x 5.0 cm simple fluid collection, compatible with postsurgical seroma or lymphocele. 4. Small hiatal hernia. 5.  Aortic Atherosclerosis (ICD10-I70.0).   06/27/2018 Tumor Marker   Patient's tumor was tested for the following markers: CA-125 Results of the tumor marker test revealed 90.3   07/01/2018 - 10/14/2018 Chemotherapy   The patient had palonosetron (ALOXI) injection 0.25 mg,  0.25 mg, Intravenous,  Once, 6 of 6 cycles  Administration: 0.25 mg (07/01/2018), 0.25 mg (07/22/2018), 0.25 mg (08/12/2018), 0.25 mg (09/02/2018), 0.25 mg (09/23/2018), 0.25 mg (10/14/2018)    CARBOplatin (PARAPLATIN) 420 mg in sodium chloride 0.9 % 250 mL chemo infusion, 420 mg (98.3 % of original dose 425.4 mg), Intravenous,  Once, 6 of 6 cycles  Dose modification:   (original dose 425.4 mg, Cycle 1), 380 mg (original dose 425.4 mg, Cycle 5, Reason: Dose not tolerated), 380 mg (original dose 425.4 mg, Cycle 6, Reason: Dose not tolerated)  Administration: 420 mg (07/01/2018), 440 mg (07/22/2018), 380 mg (08/12/2018), 440 mg (09/02/2018), 380 mg (09/23/2018), 380 mg (10/14/2018)    PACLitaxel (TAXOL) 270 mg in sodium chloride 0.9 % 250 mL chemo infusion (> $RemoveBef'80mg'bGxWZZTVCA$ /m2), 175 mg/m2 = 270 mg, Intravenous,  Once, 6 of 6 cycles  Administration: 270 mg (07/01/2018), 270 mg (07/22/2018), 270 mg (08/12/2018), 270 mg  (09/02/2018), 270 mg (09/23/2018), 270 mg (10/14/2018)    fosaprepitant (EMEND) 150 mg, dexamethasone (DECADRON) 12 mg in sodium chloride 0.9 % 145 mL IVPB, , Intravenous,  Once, 6 of 6 cycles  Administration:  (07/01/2018),  (07/22/2018),  (08/12/2018),  (09/02/2018),  (09/23/2018),  (10/14/2018)  for chemotherapy treatment.     07/15/2018 Imaging   Vascular US Findings consistent with acute deep vein thrombosis involving the right internal jugular veins. She is placed on Xarelto   07/21/2018 Tumor Marker   Patient's tumor was tested for the following markers: CA-125 Results of the tumor marker test revealed 22.8   09/22/2018 Tumor Marker   Patient's tumor was tested for the following markers: CA-125 Results of the tumor marker test revealed 14.5   11/10/2018 Imaging   Status post hysterectomy and bilateral salpingo-oophorectomy.   4.9 cm improving postoperative seroma along the left pelvic sidewall.   No evidence of recurrent or metastatic disease.     11/10/2018 Tumor Marker   Patient's tumor was tested for the following markers: CA-125 Results of the tumor marker test revealed 12.6   11/24/2018 Procedure   Successful removal of implanted Port-A-Cath.     Interval History: Reports overall doing well.  Endorses a good appetite without nausea or emesis.  Reports regular bowel and bladder function.  Has occasional twinges in her abdomen, denies any abdominal or pelvic pain otherwise.  Has noticed some increased low back pain on the right.  Past Medical/Surgical History: Past Medical History:  Diagnosis Date   AKI (acute kidney injury) ([place]) 05/23/2018   Arthritis    in bilateral knees   Cancer ([place])    left ovarian cancer   Family history of colon cancer    Family history of ovarian cancer    GERD (gastroesophageal reflux disease)    History of DVT (deep vein thrombosis) 2019   had a catheter-related DVT during chemotherapy   Hyperlipidemia    Hypertension    Steroid  myopathy 09/22/2018    Past Surgical History:  Procedure Laterality Date   ABDOMINAL HYSTERECTOMY     BIOPSY THYROID  2008   COLONOSCOPY     IR CV LINE INJECTION  07/15/2018   IR REMOVAL TUN ACCESS W/ PORT W/O FL MOD SED  11/24/2018   TONSILECTOMY, ADENOIDECTOMY, BILATERAL MYRINGOTOMY AND TUBES Bilateral 1957    Family History  Problem Relation Age of Onset   Hypertension Father    Diabetes Father    Coronary artery disease Father    Hyperlipidemia Father    Colon cancer Father  1's   Hypertension Mother    Arthritis Mother    Cancer Maternal Aunt        type unk   Ovarian cancer Cousin 38   Cancer Cousin        mouth    Social History   Socioeconomic History   Marital status: Married    Spouse name: Louie Casa   Number of children: 3   Years of education: Not on file   Highest education level: Not on file  Occupational History   Occupation: retired Solicitor  Tobacco Use   Smoking status: Never   Smokeless tobacco: Never  Vaping Use   Vaping Use: Never used  Substance and Sexual Activity   Alcohol use: No   Drug use: No   Sexual activity: Not on file  Other Topics Concern   Not on file  Social History Narrative   Diet:Unrestricted   Do you drink/eat things with caffeine? Rarely   Marital status: Married                             What year were you married? 1992   Do you live in a house, apartment, assisted living, condo, trailer, etc)? House   Is it one or more stories? 1   How many persons live in your home? 3   Do you have any pets in your home?  2 small dogs   Current or past profession: Chief of Staff   Do you exercise?                                                     Type & how often:   Do you have a living will? No   Do you have a DNR Form? No   Do you have a POA/HPOA forms? NO   Social Determinants of Health   Financial Resource Strain: Low Risk  (09/05/2017)   Overall Financial Resource Strain (CARDIA)    Difficulty of  Paying Living Expenses: Not hard at all  Food Insecurity: No Food Insecurity (09/05/2017)   Hunger Vital Sign    Worried About Running Out of Food in the Last Year: Never true    Ran Out of Food in the Last Year: Never true  Transportation Needs: No Transportation Needs (09/05/2017)   PRAPARE - Hydrologist (Medical): No    Lack of Transportation (Non-Medical): No  Physical Activity: Inactive (09/05/2017)   Exercise Vital Sign    Days of Exercise per Week: 0 days    Minutes of Exercise per Session: 0 min  Stress: No Stress Concern Present (09/05/2017)   [place]    Feeling of Stress : Only a little  Social Connections: Moderately Integrated (09/05/2017)   Social Connection and Isolation Panel [NHANES]    Frequency of Communication with Friends and Family: Never    Frequency of Social Gatherings with Friends and Family: More than three times a week    Attends Religious Services: 1 to 4 times per year    Active Member of [employer] or Organizations: No    Attends Archivist Meetings: Never    Marital Status: Married    Current Medications:  Current Outpatient Medications:    alendronate (FOSAMAX)  70 MG tablet, TAKE 1 TABLET EVERY WEEK WITH A FULL GLASS OF WATER ON AN EMPTY STOMACH, Disp: 12 tablet, Rfl: 1   Ascorbic Acid (VITAMIN C PO), Take 500 mg by mouth daily., Disp: , Rfl:    Biotin 10000 MCG TABS, Take 1 tablet by mouth daily., Disp: , Rfl:    BLACK COHOSH PO, Take 1 capsule by mouth daily., Disp: , Rfl:    Calcium-Magnesium-Vitamin D (CALCIUM 1200+D3 PO), , Disp: , Rfl:    diphenhydrAMINE (BENADRYL) 25 MG tablet, Take 25 mg by mouth at bedtime., Disp: , Rfl:    Glucosamine HCl ([employer] GLUCOSAMINE PO), 2 tabs by mouth in the morning and 1 tab by mouth at night, Disp: , Rfl:    losartan (COZAAR) 50 MG tablet, Take 1 tablet (50 mg total) by mouth daily., Disp: 90 tablet, Rfl: 1    metoprolol tartrate (LOPRESSOR) 50 MG tablet, TAKE 1 TABLET (50 MG TOTAL) BY MOUTH 2 (TWO) TIMES DAILY., Disp: 180 tablet, Rfl: 3   Multiple Vitamin (MULTIVITAMIN) tablet, Take 1 tablet by mouth 2 (two) times daily. , Disp: , Rfl:    Omega-3 Fatty Acids (OMEGA-3 FISH OIL PO), Take 1 tablet by mouth 2 (two) times daily., Disp: , Rfl:    omeprazole (PRILOSEC) 20 MG capsule, TAKE 1 CAPSULE EVERY DAY, Disp: 90 capsule, Rfl: 3   Probiotic Product (PROBIOTIC-10 PO), , Disp: , Rfl:    simvastatin (ZOCOR) 20 MG tablet, TAKE 1 TABLET EVERY DAY, Disp: 90 tablet, Rfl: 1  Review of Systems: Denies appetite changes, fevers, chills, fatigue, unexplained weight changes. Denies hearing loss, neck lumps or masses, mouth sores, ringing in ears or voice changes. Denies cough or wheezing.  Denies shortness of breath. Denies chest pain or palpitations. Denies leg swelling. Denies abdominal distention, pain, blood in stools, constipation, diarrhea, nausea, vomiting, or early satiety. Denies pain with intercourse, dysuria, frequency, hematuria or incontinence. Denies hot flashes, pelvic pain, vaginal bleeding or vaginal discharge.   Denies joint pain or muscle pain/cramps. Denies itching, rash, or wounds. Denies dizziness, headaches, numbness or seizures. Denies swollen lymph nodes or glands, denies easy bruising or bleeding. Denies anxiety, depression, confusion, or decreased concentration.  Physical Exam: BP 132/88 (BP Location: Left Arm, Patient Position: Sitting)   Pulse 82   Temp 98.3 F (36.8 C) (Oral)   Resp 18   Ht $R'4\' 10"'au$  (1.473 m)   Wt 160 lb 1.6 oz (72.6 kg)   BMI 33.46 kg/m  General: Alert, oriented, no acute distress. HEENT: Normocephalic, atraumatic, sclera anicteric. Chest: Clear to auscultation bilaterally.  No wheezes or rhonchi. Cardiovascular: Regular rate and rhythm, no murmurs. Abdomen: Obese, soft, nontender.  Normoactive bowel sounds.  No masses or hepatosplenomegaly appreciated.   Well-healed incisions. Extremities: Grossly normal range of motion.  Warm, well perfused.  No edema bilaterally. Skin: No rashes or lesions noted. Lymphatics: No cervical, supraclavicular, or inguinal adenopathy. GU: Normal appearing external genitalia without erythema, excoriation, or lesions.  Speculum exam reveals atrophic vaginal mucosa, no lesions or masses.  Bimanual exam reveals cuff intact, no nodularity or masses.  Rectovaginal exam confirms these findings. Back: Approximately 4 cm area to the right of the spine along the lobe back, above the buttocks, firm to palpation.  Laboratory & Radiologic Studies: Component Ref Range & Units 5 mo ago (11/03/21) 11 mo ago (04/26/21) 1 yr ago (10/25/20) 1 yr ago (07/26/20) 1 yr ago (04/20/20) 2 yr ago (01/26/20) 2 yr ago (07/23/19)  Cancer Antigen (CA) 125 0.0 -  38.1 U/mL 16.2  16.3 CM  14.2 CM  12.6 CM  15.4 CM  13.8 CM  10.8 CM    Assessment & Plan: Melissa Simpson is a 71 y.o. woman with Stage IC2 status post staging surgery in 05/2018 and completion of adjuvant chemotherapy in 10/2018 who presents for surveillance. Somatic BRCA1 mutation.   The patient is doing very well and continues to be NED on exam.  She had her CA-125 drawn today and it will release to her tomorrow in [place].  She has had some right low back pain.  On exam, I feel with either feels like a knot in her upper gluteus muscle.  Alternatively, this could be a mass, such as a lipoma.  We discussed 2 options.  One would be that the patient could use some heat therapy and massage.  Otherwise, we could get an ultrasound to look at the area.  Patient would like to try heat therapy and massage first and will let me know if these interventions help with her symptoms.   We will continue with visits every 6 months until 5 years.  She will come back and see me in mid February 2024.  She was asked to call at the end of the year to schedule that visit.  We discussed signs and symptoms that  if she were to develop should prompt a phone call to be seen sooner.  22 minutes of total time was spent for this patient encounter, including preparation, face-to-face counseling with the patient and coordination of care, and documentation of the encounter.  Jeral Pinch, MD  Division of Gynecologic Oncology  Department of Obstetrics and Gynecology  [facility]

## 2022-04-16 NOTE — Patient Instructions (Addendum)
It was good to see you today. I don't see or feel any evidence of cancer on your exam.  Try some heat and massage over the knot in your lower back.  If this does not get better, please call me and I can order an ultrasound to take a look at this area.  I will contact you with your CA-125 results.   Please call back in December or January to schedule a visit to see me in February on 2024. As always, please call if you develop any new and concerning symptoms prior to that.

## 2022-04-18 LAB — CA 125: Cancer Antigen (CA) 125: 16 U/mL (ref 0.0–38.1)

## 2022-05-07 ENCOUNTER — Ambulatory Visit (INDEPENDENT_AMBULATORY_CARE_PROVIDER_SITE_OTHER): Payer: Medicare HMO | Admitting: Nurse Practitioner

## 2022-05-07 ENCOUNTER — Encounter: Payer: Self-pay | Admitting: Nurse Practitioner

## 2022-05-07 VITALS — BP 132/82 | HR 51 | Temp 96.6°F | Resp 20 | Ht <= 58 in | Wt 161.8 lb

## 2022-05-07 DIAGNOSIS — H9202 Otalgia, left ear: Secondary | ICD-10-CM | POA: Diagnosis not present

## 2022-05-07 DIAGNOSIS — H9313 Tinnitus, bilateral: Secondary | ICD-10-CM | POA: Diagnosis not present

## 2022-05-07 DIAGNOSIS — F5101 Primary insomnia: Secondary | ICD-10-CM | POA: Diagnosis not present

## 2022-05-07 DIAGNOSIS — E781 Pure hyperglyceridemia: Secondary | ICD-10-CM | POA: Diagnosis not present

## 2022-05-07 DIAGNOSIS — M810 Age-related osteoporosis without current pathological fracture: Secondary | ICD-10-CM | POA: Diagnosis not present

## 2022-05-07 DIAGNOSIS — K219 Gastro-esophageal reflux disease without esophagitis: Secondary | ICD-10-CM

## 2022-05-07 DIAGNOSIS — I10 Essential (primary) hypertension: Secondary | ICD-10-CM | POA: Diagnosis not present

## 2022-05-07 DIAGNOSIS — R739 Hyperglycemia, unspecified: Secondary | ICD-10-CM | POA: Diagnosis not present

## 2022-05-07 DIAGNOSIS — Z8543 Personal history of malignant neoplasm of ovary: Secondary | ICD-10-CM

## 2022-05-07 NOTE — Progress Notes (Signed)
Careteam: Patient Care Team: Lauree Chandler, NP as PCP - General (Geriatric Medicine)  PLACE OF SERVICE:  [facility]  Advanced Directive information    Allergies  Allergen Reactions   Pollen Extract     Chief Complaint  Patient presents with   Medical Management of Chronic Issues    Patient presents today for a 6 month follow-up.   Quality Metric Gaps    COVID # 4     HPI: Patient is a 71 y.o. female for routine follow up.   C/o lt ear pain that started last night and so she started some ear drops that she had leftover from the last time she had ear pain, and tylenol, with improvements.  White noise in her ears for at least a couple of months, sounds like static. Doesn't notice it much during the day but does at night.   States appetite is good, but probably not eating balanced meals.  Exercises rarely d/t arthritic pain.   Checks her blood pressure at home occasionally and it has been WNL. No chest pain, dyspnea, or edema.   Does not check blood glucose at home, but denies having symptoms of hypoglycemia.  Recently saw oncologist for hx of left ovarian cancer and continues to follow with them every 6 months.   Continues to take benadryl to sleep and states that it works. Has been previously educated on the adverse effects of this medication and declines further education.   Review of Systems:  Review of Systems  Constitutional:  Negative for chills, fever and weight loss.  HENT:  Positive for ear pain and tinnitus. Negative for congestion, ear discharge, hearing loss, sinus pain and sore throat.   Eyes:  Negative for blurred vision, pain and redness.  Respiratory:  Negative for cough, shortness of breath and wheezing.   Cardiovascular:  Negative for chest pain, palpitations and leg swelling.  Gastrointestinal:  Positive for heartburn (controlled). Negative for abdominal pain, blood in stool, constipation, diarrhea and vomiting.  Genitourinary:  Negative for  frequency, hematuria and urgency.  Musculoskeletal:  Positive for joint pain.  Neurological:  Negative for dizziness, weakness and headaches.  Psychiatric/Behavioral:  Negative for depression. The patient is not nervous/anxious.    Past Medical History:  Diagnosis Date   AKI (acute kidney injury) ([place]) 05/23/2018   Arthritis    in bilateral knees   Cancer ([place])    left ovarian cancer   Family history of colon cancer    Family history of ovarian cancer    GERD (gastroesophageal reflux disease)    History of DVT (deep vein thrombosis) 2019   had a catheter-related DVT during chemotherapy   Hyperlipidemia    Hypertension    Steroid myopathy 09/22/2018   Past Surgical History:  Procedure Laterality Date   ABDOMINAL HYSTERECTOMY     BIOPSY THYROID  2008   COLONOSCOPY     IR CV LINE INJECTION  07/15/2018   IR REMOVAL TUN ACCESS W/ PORT W/O FL MOD SED  11/24/2018   TONSILECTOMY, ADENOIDECTOMY, BILATERAL MYRINGOTOMY AND TUBES Bilateral 1957   Social History:   reports that she has never smoked. She has never used smokeless tobacco. She reports that she does not drink alcohol and does not use drugs.  Family History  Problem Relation Age of Onset   Hypertension Father    Diabetes Father    Coronary artery disease Father    Hyperlipidemia Father    Colon cancer Father  8's   Hypertension Mother    Arthritis Mother    Cancer Maternal Aunt        type unk   Ovarian cancer Cousin 19   Cancer Cousin        mouth    Medications: Patient's Medications  New Prescriptions   No medications on file  Previous Medications   ALENDRONATE (FOSAMAX) 70 MG TABLET    TAKE 1 TABLET EVERY WEEK WITH A FULL GLASS OF WATER ON AN EMPTY STOMACH   ASCORBIC ACID (VITAMIN C PO)    Take 500 mg by mouth daily.   ASPIRIN EC 81 MG TABLET    Take 81 mg by mouth daily. Swallow whole.   BIOTIN 22297 MCG TABS    Take 1 tablet by mouth daily.   BLACK COHOSH PO    Take 1 capsule by mouth daily.    CALCIUM-MAGNESIUM-VITAMIN D (CALCIUM 1200+D3 PO)       DIPHENHYDRAMINE (BENADRYL) 25 MG TABLET    Take 25 mg by mouth at bedtime.   GLUCOSAMINE HCL ([facility] GLUCOSAMINE PO)    2 tabs by mouth in the morning and 1 tab by mouth at night   LOSARTAN (COZAAR) 50 MG TABLET    Take 1 tablet (50 mg total) by mouth daily.   METOPROLOL TARTRATE (LOPRESSOR) 50 MG TABLET    TAKE 1 TABLET (50 MG TOTAL) BY MOUTH 2 (TWO) TIMES DAILY.   MULTIPLE VITAMIN (MULTIVITAMIN) TABLET    Take 1 tablet by mouth 2 (two) times daily.    OMEGA-3 FATTY ACIDS (OMEGA-3 FISH OIL PO)    Take 1 tablet by mouth 2 (two) times daily.   OMEPRAZOLE (PRILOSEC) 20 MG CAPSULE    TAKE 1 CAPSULE EVERY DAY   PROBIOTIC PRODUCT (PROBIOTIC-10 PO)       SIMVASTATIN (ZOCOR) 20 MG TABLET    TAKE 1 TABLET EVERY DAY  Modified Medications   No medications on file  Discontinued Medications   No medications on file    Physical Exam:  Vitals:   05/07/22 0956  BP: 132/82  Pulse: (!) 51  Resp: 20  Temp: (!) 96.6 F (35.9 C)  SpO2: 98%  Weight: 73.4 kg  Height: '4\' 10"'  (1.473 m)   Body mass index is 33.82 kg/m. Wt Readings from Last 3 Encounters:  05/07/22 73.4 kg  04/16/22 72.6 kg  11/06/21 73.9 kg    Physical Exam Constitutional:      General: She is not in acute distress.    Appearance: Normal appearance.  HENT:     Right Ear: Tympanic membrane, ear canal and external ear normal. There is no impacted cerumen.     Left Ear: Tympanic membrane, ear canal and external ear normal. There is no impacted cerumen.     Ears:     Comments: Slight erythema    Nose: No rhinorrhea.     Mouth/Throat:     Mouth: Mucous membranes are moist.     Pharynx: No oropharyngeal exudate or posterior oropharyngeal erythema.  Eyes:     Conjunctiva/sclera: Conjunctivae normal.     Pupils: Pupils are equal, round, and reactive to light.  Cardiovascular:     Rate and Rhythm: Normal rate and regular rhythm.     Pulses: Normal pulses.     Heart sounds:  Normal heart sounds.  Pulmonary:     Effort: Pulmonary effort is normal. No respiratory distress.     Breath sounds: Normal breath sounds.  Abdominal:     General:  Bowel sounds are normal.     Palpations: Abdomen is soft.     Tenderness: There is no abdominal tenderness. There is no guarding.  Musculoskeletal:     Right lower leg: No edema.     Left lower leg: No edema.  Skin:    General: Skin is warm and dry.     Findings: No rash.  Neurological:     Mental Status: She is alert and oriented to person, place, and time. Mental status is at baseline.  Psychiatric:        Mood and Affect: Mood normal.        Behavior: Behavior normal.     Labs reviewed: Basic Metabolic Panel: Recent Labs    09/21/21 0952 11/06/21 1151  NA 139 139  K 3.9 4.2  CL 101 103  CO2 29 25  GLUCOSE 97 99  BUN 20 20  CREATININE 1.17* 1.09*  CALCIUM 9.8 9.8   Liver Function Tests: Recent Labs    09/21/21 0952  AST 25  ALT 26  BILITOT 0.9  PROT 7.1   No results for input(s): "LIPASE", "AMYLASE" in the last 8760 hours. No results for input(s): "AMMONIA" in the last 8760 hours. CBC: Recent Labs    09/21/21 0952  WBC 5.0  NEUTROABS 2,885  HGB 15.2  HCT 44.8  MCV 100.2*  PLT 303   Lipid Panel: Recent Labs    09/21/21 0952  CHOL 140  HDL 47*  LDLCALC 66  TRIG 196*  CHOLHDL 3.0   TSH: No results for input(s): "TSH" in the last 8760 hours. A1C: Lab Results  Component Value Date   HGBA1C 6.0 (H) 09/21/2021     Assessment/Plan 1. Essential hypertension, benign - Blood pressure well controlled, goal bp <140/90 Continue current medications and dietary modifications follow metabolic panel - continue losartan and metoprolol - CBC with Differential/Platelet - CMP with eGFR([facility])  2. Hyperglycemia - encourage diet modifications - Hemoglobin A1c  3. Pure hypertriglyceridemia - continue simvastatin - CBC with Differential/Platelet - CMP with eGFR([facility]) - Lipid  Panel  4. Ear pain, left - exam with mild erythema to part of canal but not infection/drainage. -no pain today -continue OTC ear drops and tylenol  - Return if no improvement.   5. Primary insomnia - continue benadryl per pt preference, aware of side effects  6. History of ovarian cancer - follows with oncology  7. Gastroesophageal reflux disease, unspecified whether esophagitis present - continue omeprazole and dietary modifications.   8.Osteoperosis -Continue alendronate -Recommended to take calcium 600 mg twice daily with Vitamin D 2000 units daily and weight bearing activity 30 mins/5 days a week  9.Tinnitus of both ears - Ambulatory referral to ENT for further evaluation     Return in about 6 months (around 11/07/2022) for routine follow up .  Student- Waunita Schooner, RN -I personally was present during the history, physical exam and medical decision-making activities of this service and have verified that the service and findings are accurately documented in the student's note` Nahum Sherrer K. Alda, [facility] [phone]

## 2022-05-08 LAB — COMPLETE METABOLIC PANEL WITH GFR
AG Ratio: 1.9 (calc) (ref 1.0–2.5)
ALT: 20 U/L (ref 6–29)
AST: 21 U/L (ref 10–35)
Albumin: 4.6 g/dL (ref 3.6–5.1)
Alkaline phosphatase (APISO): 29 U/L — ABNORMAL LOW (ref 37–153)
BUN/Creatinine Ratio: 19 (calc) (ref 6–22)
BUN: 21 mg/dL (ref 7–25)
CO2: 25 mmol/L (ref 20–32)
Calcium: 9.6 mg/dL (ref 8.6–10.4)
Chloride: 102 mmol/L (ref 98–110)
Creat: 1.09 mg/dL — ABNORMAL HIGH (ref 0.60–1.00)
Globulin: 2.4 g/dL (calc) (ref 1.9–3.7)
Glucose, Bld: 101 mg/dL — ABNORMAL HIGH (ref 65–99)
Potassium: 4 mmol/L (ref 3.5–5.3)
Sodium: 139 mmol/L (ref 135–146)
Total Bilirubin: 0.9 mg/dL (ref 0.2–1.2)
Total Protein: 7 g/dL (ref 6.1–8.1)
eGFR: 54 mL/min/{1.73_m2} — ABNORMAL LOW (ref >=60)

## 2022-05-08 LAB — LIPID PANEL
Cholesterol: 152 mg/dL (ref <200)
HDL: 49 mg/dL — ABNORMAL LOW (ref >=50)
LDL Cholesterol (Calc): 68 mg/dL (calc)
Non-HDL Cholesterol (Calc): 103 mg/dL (calc) (ref <130)
Total CHOL/HDL Ratio: 3.1 (calc) (ref <5.0)
Triglycerides: 267 mg/dL — ABNORMAL HIGH (ref <150)

## 2022-05-08 LAB — CBC WITH DIFFERENTIAL/PLATELET
Absolute Monocytes: 555 cells/uL (ref 200–950)
Basophils Absolute: 50 cells/uL (ref 0–200)
Basophils Relative: 1 %
Eosinophils Absolute: 160 cells/uL (ref 15–500)
Eosinophils Relative: 3.2 %
HCT: 43.9 % (ref 35.0–45.0)
Hemoglobin: 15.2 g/dL (ref 11.7–15.5)
Lymphs Abs: 1720 cells/uL (ref 850–3900)
MCH: 33.9 pg — ABNORMAL HIGH (ref 27.0–33.0)
MCHC: 34.6 g/dL (ref 32.0–36.0)
MCV: 97.8 fL (ref 80.0–100.0)
MPV: 9.7 fL (ref 7.5–12.5)
Monocytes Relative: 11.1 %
Neutro Abs: 2515 cells/uL (ref 1500–7800)
Neutrophils Relative %: 50.3 %
Platelets: 257 10*3/uL (ref 140–400)
RBC: 4.49 10*6/uL (ref 3.80–5.10)
RDW: 12 % (ref 11.0–15.0)
Total Lymphocyte: 34.4 %
WBC: 5 10*3/uL (ref 3.8–10.8)

## 2022-05-08 LAB — HEMOGLOBIN A1C
Hgb A1c MFr Bld: 5.8 % of total Hgb — ABNORMAL HIGH (ref <5.7)
Mean Plasma Glucose: 120 mg/dL
eAG (mmol/L): 6.6 mmol/L

## 2022-05-16 ENCOUNTER — Other Ambulatory Visit: Payer: Self-pay | Admitting: Nurse Practitioner

## 2022-05-16 ENCOUNTER — Encounter: Payer: Self-pay | Admitting: Nurse Practitioner

## 2022-05-16 DIAGNOSIS — I10 Essential (primary) hypertension: Secondary | ICD-10-CM

## 2022-05-28 ENCOUNTER — Telehealth: Payer: Self-pay

## 2022-05-28 NOTE — Telephone Encounter (Signed)
Patient called the office and states that she has referral to ENT. Patient called ENT office and told them she is being seen for sounds in her ears. But they told her that she's being seen for allergic rhinitis. I confirmed with patient that diagnosis for referral is "Tinnitus of both ears". I faxed referral to ENT office for patient. I advised her to call back tomorrow if she is still having anymore issues with ENT office. Message routed to PCP Dewaine Oats, [facility], [facility] s FYI. No further action is required.

## 2022-07-20 ENCOUNTER — Other Ambulatory Visit: Payer: Medicare HMO

## 2022-07-20 ENCOUNTER — Telehealth: Payer: Medicare HMO | Admitting: Gynecologic Oncology

## 2022-07-30 ENCOUNTER — Encounter: Payer: Self-pay | Admitting: Nurse Practitioner

## 2022-07-30 DIAGNOSIS — H903 Sensorineural hearing loss, bilateral: Secondary | ICD-10-CM | POA: Diagnosis not present

## 2022-07-30 DIAGNOSIS — H9313 Tinnitus, bilateral: Secondary | ICD-10-CM | POA: Diagnosis not present

## 2022-08-10 ENCOUNTER — Encounter: Payer: Self-pay | Admitting: Nurse Practitioner

## 2022-09-08 ENCOUNTER — Other Ambulatory Visit: Payer: Self-pay | Admitting: Nurse Practitioner

## 2022-09-08 DIAGNOSIS — K219 Gastro-esophageal reflux disease without esophagitis: Secondary | ICD-10-CM

## 2022-09-08 DIAGNOSIS — I10 Essential (primary) hypertension: Secondary | ICD-10-CM

## 2022-09-15 ENCOUNTER — Other Ambulatory Visit: Payer: Self-pay | Admitting: Nurse Practitioner

## 2022-09-15 DIAGNOSIS — E781 Pure hyperglyceridemia: Secondary | ICD-10-CM

## 2022-09-24 ENCOUNTER — Encounter: Payer: Self-pay | Admitting: Nurse Practitioner

## 2022-09-24 ENCOUNTER — Ambulatory Visit (INDEPENDENT_AMBULATORY_CARE_PROVIDER_SITE_OTHER): Payer: Medicare HMO | Admitting: Nurse Practitioner

## 2022-09-24 VITALS — BP 122/88 | HR 62 | Temp 97.6°F | Resp 18 | Ht <= 58 in | Wt 164.0 lb

## 2022-09-24 DIAGNOSIS — R739 Hyperglycemia, unspecified: Secondary | ICD-10-CM | POA: Diagnosis not present

## 2022-09-24 DIAGNOSIS — E781 Pure hyperglyceridemia: Secondary | ICD-10-CM | POA: Diagnosis not present

## 2022-09-24 DIAGNOSIS — Z0000 Encounter for general adult medical examination without abnormal findings: Secondary | ICD-10-CM | POA: Diagnosis not present

## 2022-09-24 DIAGNOSIS — I10 Essential (primary) hypertension: Secondary | ICD-10-CM

## 2022-09-24 NOTE — Patient Instructions (Signed)
[name] , Thank you for taking time to come for your Medicare Wellness Visit. I appreciate your ongoing commitment to your health goals. Please review the following plan we discussed and let me know if I can assist you in the future.   Screening recommendations/referrals: Colonoscopy up to date Mammogram Due in April 2024 Bone Density up to date Recommended yearly ophthalmology/optometry visit for glaucoma screening and checkup Recommended yearly dental visit for hygiene and checkup  Vaccinations: Influenza vaccine- due annually in September/October Pneumococcal vaccine up to date Tdap vaccine DUE- recommend to get at your local pharmacy Shingles vaccine up to date    Advanced directives: on file.   Conditions/risks identified: sedentary lifestyle, obesity  Next appointment: yearly for awv   Preventive Care 74 Years and Older, Female Preventive care refers to lifestyle choices and visits with your health care provider that can promote health and wellness. What does preventive care include? A yearly physical exam. This is also called an annual well check. Dental exams once or twice a year. Routine eye exams. Ask your health care provider how often you should have your eyes checked. Personal lifestyle choices, including: Daily care of your teeth and gums. Regular physical activity. Eating a healthy diet. Avoiding tobacco and drug use. Limiting alcohol use. Practicing safe sex. Taking low-dose aspirin every day. Taking vitamin and mineral supplements as recommended by your health care provider. What happens during an annual well check? The services and screenings done by your health care provider during your annual well check will depend on your age, overall health, lifestyle risk factors, and family history of disease. Counseling  Your health care provider may ask you questions about your: Alcohol use. Tobacco use. Drug use. Emotional well-being. Home and relationship  well-being. Sexual activity. Eating habits. History of falls. Memory and ability to understand (cognition). Work and work Statistician. Reproductive health. Screening  You may have the following tests or measurements: Height, weight, and BMI. Blood pressure. Lipid and cholesterol levels. These may be checked every 5 years, or more frequently if you are over 68 years old. Skin check. Lung cancer screening. You may have this screening every year starting at age 74 if you have a 30-pack-year history of smoking and currently smoke or have quit within the past 15 years. Fecal occult blood test (FOBT) of the stool. You may have this test every year starting at age 74. Flexible sigmoidoscopy or colonoscopy. You may have a sigmoidoscopy every 5 years or a colonoscopy every 10 years starting at age 109. Hepatitis C blood test. Hepatitis B blood test. Sexually transmitted disease (STD) testing. Diabetes screening. This is done by checking your blood sugar (glucose) after you have not eaten for a while (fasting). You may have this done every 1-3 years. Bone density scan. This is done to screen for osteoporosis. You may have this done starting at age 74. Mammogram. This may be done every 1-2 years. Talk to your health care provider about how often you should have regular mammograms. Talk with your health care provider about your test results, treatment options, and if necessary, the need for more tests. Vaccines  Your health care provider may recommend certain vaccines, such as: Influenza vaccine. This is recommended every year. Tetanus, diphtheria, and acellular pertussis (Tdap, Td) vaccine. You may need a Td booster every 10 years. Zoster vaccine. You may need this after age 17. Pneumococcal 13-valent conjugate (PCV13) vaccine. One dose is recommended after age 74. Pneumococcal polysaccharide (PPSV23) vaccine. One dose is  recommended after age 74. Talk to your health care provider about which  screenings and vaccines you need and how often you need them. This information is not intended to replace advice given to you by your health care provider. Make sure you discuss any questions you have with your health care provider. Document Released: 09/23/2015 Document Revised: 05/16/2016 Document Reviewed: 06/28/2015 Elsevier Interactive Patient Education  2017 [place] Prevention in the Home Falls can cause injuries. They can happen to people of all ages. There are many things you can do to make your home safe and to help prevent falls. What can I do on the outside of my home? Regularly fix the edges of walkways and driveways and fix any cracks. Remove anything that might make you trip as you walk through a door, such as a raised step or threshold. Trim any bushes or trees on the path to your home. Use bright outdoor lighting. Clear any walking paths of anything that might make someone trip, such as rocks or tools. Regularly check to see if handrails are loose or broken. Make sure that both sides of any steps have handrails. Any raised decks and porches should have guardrails on the edges. Have any leaves, snow, or ice cleared regularly. Use sand or salt on walking paths during winter. Clean up any spills in your garage right away. This includes oil or grease spills. What can I do in the bathroom? Use night lights. Install grab bars by the toilet and in the tub and shower. Do not use towel bars as grab bars. Use non-skid mats or decals in the tub or shower. If you need to sit down in the shower, use a plastic, non-slip stool. Keep the floor dry. Clean up any water that spills on the floor as soon as it happens. Remove soap buildup in the tub or shower regularly. Attach bath mats securely with double-sided non-slip rug tape. Do not have throw rugs and other things on the floor that can make you trip. What can I do in the bedroom? Use night lights. Make sure that you have a  light by your bed that is easy to reach. Do not use any sheets or blankets that are too big for your bed. They should not hang down onto the floor. Have a firm chair that has side arms. You can use this for support while you get dressed. Do not have throw rugs and other things on the floor that can make you trip. What can I do in the kitchen? Clean up any spills right away. Avoid walking on wet floors. Keep items that you use a lot in easy-to-reach places. If you need to reach something above you, use a strong step stool that has a grab bar. Keep electrical cords out of the way. Do not use floor polish or wax that makes floors slippery. If you must use wax, use non-skid floor wax. Do not have throw rugs and other things on the floor that can make you trip. What can I do with my stairs? Do not leave any items on the stairs. Make sure that there are handrails on both sides of the stairs and use them. Fix handrails that are broken or loose. Make sure that handrails are as long as the stairways. Check any carpeting to make sure that it is firmly attached to the stairs. Fix any carpet that is loose or worn. Avoid having throw rugs at the top or bottom of the stairs. If  you do have throw rugs, attach them to the floor with carpet tape. Make sure that you have a light switch at the top of the stairs and the bottom of the stairs. If you do not have them, ask someone to add them for you. What else can I do to help prevent falls? Wear shoes that: Do not have high heels. Have rubber bottoms. Are comfortable and fit you well. Are closed at the toe. Do not wear sandals. If you use a stepladder: Make sure that it is fully opened. Do not climb a closed stepladder. Make sure that both sides of the stepladder are locked into place. Ask someone to hold it for you, if possible. Clearly mark and make sure that you can see: Any grab bars or handrails. First and last steps. Where the edge of each step  is. Use tools that help you move around (mobility aids) if they are needed. These include: Canes. Walkers. Scooters. Crutches. Turn on the lights when you go into a dark area. Replace any light bulbs as soon as they burn out. Set up your furniture so you have a clear path. Avoid moving your furniture around. If any of your floors are uneven, fix them. If there are any pets around you, be aware of where they are. Review your medicines with your doctor. Some medicines can make you feel dizzy. This can increase your chance of falling. Ask your doctor what other things that you can do to help prevent falls. This information is not intended to replace advice given to you by your health care provider. Make sure you discuss any questions you have with your health care provider. Document Released: 06/23/2009 Document Revised: 02/02/2016 Document Reviewed: 10/01/2014 Elsevier Interactive Patient Education  2017 [employer].

## 2022-09-24 NOTE — Progress Notes (Signed)
Subjective:   Melissa Simpson is a 72 y.o. female who presents for Medicare Annual (Subsequent) preventive examination.  Review of Systems     Cardiac Risk Factors include: sedentary lifestyle;advanced age (>69mn, >>52women);dyslipidemia     Objective:    Today's Vitals   09/24/22 1005  BP: 122/88  Pulse: 62  Resp: 18  Temp: 97.6 F (36.4 C)  TempSrc: Temporal  SpO2: 95%  Weight: 164 lb (74.4 kg)  Height: '4\' 10"'$  (1.473 m)   Body mass index is 34.28 kg/m.     09/24/2022   10:09 AM 09/20/2022    4:29 PM 11/06/2021    9:15 AM 10/02/2021    9:42 AM 09/21/2021   11:19 AM 10/25/2020    3:26 PM 09/22/2020   10:17 AM  Advanced Directives  Does Patient Have a Medical Advance Directive? Yes Yes Yes Yes Yes Yes Yes  Type of AParamedicof AVictorLiving will HCarrolltonLiving will HMaeserLiving will HBroadwayLiving will Healthcare Power of APageLiving will HBlauveltLiving will  Does patient want to make changes to medical advance directive? No - Patient declined No - Patient declined No - Patient declined No - Patient declined No - Patient declined  No - Patient declined  Copy of HCorinnein Chart? Yes - validated most recent copy scanned in chart (See row information) Yes - validated most recent copy scanned in chart (See row information) Yes - validated most recent copy scanned in chart (See row information) Yes - validated most recent copy scanned in chart (See row information) Yes - validated most recent copy scanned in chart (See row information) Yes - validated most recent copy scanned in chart (See row information) Yes - validated most recent copy scanned in chart (See row information)    Current Medications (verified) Outpatient Encounter Medications as of 09/24/2022  Medication Sig   ABRYSVO 120 MCG/0.5ML injection Inject  0.5 mLs into the muscle once.   alendronate (FOSAMAX) 70 MG tablet TAKE 1 TABLET EVERY WEEK WITH A FULL GLASS OF WATER ON AN EMPTY STOMACH   Ascorbic Acid (VITAMIN C PO) Take 500 mg by mouth daily.   aspirin EC 81 MG tablet Take 81 mg by mouth daily. Swallow whole.   BLACK COHOSH PO Take 1 capsule by mouth daily.   Calcium-Magnesium-Vitamin D (CALCIUM 1200+D3 PO)    COMIRNATY syringe Inject 0.3 mLs into the muscle once.   diphenhydrAMINE (BENADRYL) 25 MG tablet Take 25 mg by mouth at bedtime.   Glucosamine HCl ([facility]S GLUCOSAMINE PO) 2 tabs by mouth in the morning and 1 tab by mouth at night   losartan (COZAAR) 50 MG tablet TAKE 1 TABLET EVERY DAY   metoprolol tartrate (LOPRESSOR) 50 MG tablet TAKE 1 TABLET TWICE DAILY   Multiple Vitamin (MULTIVITAMIN) tablet Take 1 tablet by mouth 2 (two) times daily.    Omega-3 Fatty Acids (OMEGA-3 FISH OIL PO) Take 1 tablet by mouth 2 (two) times daily.   omeprazole (PRILOSEC) 20 MG capsule TAKE 1 CAPSULE EVERY DAY   Probiotic Product (PROBIOTIC-10 PO)    simvastatin (ZOCOR) 20 MG tablet TAKE 1 TABLET EVERY DAY   [DISCONTINUED] Biotin 10000 MCG TABS Take 1 tablet by mouth daily.   No facility-administered encounter medications on file as of 09/24/2022.    Allergies (verified) Bee pollen and Pollen extract   History: Past Medical History:  Diagnosis Date  AKI (acute kidney injury) ([place]) 05/23/2018   Arthritis    in bilateral knees   Cancer ([place])    left ovarian cancer   Family history of colon cancer    Family history of ovarian cancer    GERD (gastroesophageal reflux disease)    History of DVT (deep vein thrombosis) 2019   had a catheter-related DVT during chemotherapy   Hyperlipidemia    Hypertension    Steroid myopathy 09/22/2018   Past Surgical History:  Procedure Laterality Date   ABDOMINAL HYSTERECTOMY     BIOPSY THYROID  2008   COLONOSCOPY     IR CV LINE INJECTION  07/15/2018   IR REMOVAL TUN ACCESS W/ PORT W/O FL MOD SED  11/24/2018    TONSILECTOMY, ADENOIDECTOMY, BILATERAL MYRINGOTOMY AND TUBES Bilateral 1957   Family History  Problem Relation Age of Onset   Hypertension Mother    Arthritis Mother    Hypertension Father    Diabetes Father    Coronary artery disease Father    Hyperlipidemia Father    Colon cancer Father        51's   [id number] positive Daughter    Cancer Maternal Aunt        type unk   Ovarian cancer Cousin 85   Cancer Cousin        mouth   Social History   Socioeconomic History   Marital status: Married    Spouse name: Louie Casa   Number of children: 3   Years of education: Not on file   Highest education level: Not on file  Occupational History   Occupation: retired Solicitor  Tobacco Use   Smoking status: Never   Smokeless tobacco: Never  Vaping Use   Vaping Use: Never used  Substance and Sexual Activity   Alcohol use: No   Drug use: No   Sexual activity: Not on file  Other Topics Concern   Not on file  Social History Narrative   Diet:Unrestricted   Do you drink/eat things with caffeine? Rarely   Marital status: Married                             What year were you married? 1992   Do you live in a house, apartment, assisted living, condo, trailer, etc)? House   Is it one or more stories? 1   How many persons live in your home? 3   Do you have any pets in your home?  2 small dogs   Current or past profession: Chief of Staff   Do you exercise?                                                     Type & how often:   Do you have a living will? No   Do you have a DNR Form? No   Do you have a POA/HPOA forms? NO   Social Determinants of Health   Financial Resource Strain: Low Risk  (09/05/2017)   Overall Financial Resource Strain (CARDIA)    Difficulty of Paying Living Expenses: Not hard at all  Food Insecurity: No Food Insecurity (09/05/2017)   Hunger Vital Sign    Worried About Running Out of Food in the Last Year: Never true    Ran Out of Food  in the Last Year:  Never true  Transportation Needs: No Transportation Needs (09/05/2017)   PRAPARE - Hydrologist (Medical): No    Lack of Transportation (Non-Medical): No  Physical Activity: Inactive (09/05/2017)   Exercise Vital Sign    Days of Exercise per Week: 0 days    Minutes of Exercise per Session: 0 min  Stress: No Stress Concern Present (09/05/2017)   [place]    Feeling of Stress : Only a little  Social Connections: Moderately Integrated (09/05/2017)   Social Connection and Isolation Panel [NHANES]    Frequency of Communication with Friends and Family: Never    Frequency of Social Gatherings with Friends and Family: More than three times a week    Attends Religious Services: 1 to 4 times per year    Active Member of [employer] or Organizations: No    Attends Music therapist: Never    Marital Status: Married    Tobacco Counseling Counseling given: Not Answered   Clinical Intake:  Pre-visit preparation completed: Yes  Pain : No/denies pain     BMI - recorded: 34 Nutritional Status: BMI > 30  Obese Nutritional Risks: None Diabetes: No  How often do you need to have someone help you when you read instructions, pamphlets, or other written materials from your doctor or pharmacy?: 1 - Never  Diabetic?no         Activities of Daily Living    09/24/2022   10:26 AM  In your present state of health, do you have any difficulty performing the following activities:  Hearing? 0  Vision? 0  Difficulty concentrating or making decisions? 0  Walking or climbing stairs? 1  Comment climbing stairs with knee  Dressing or bathing? 0  Doing errands, shopping? 0  Preparing Food and eating ? N  Using the Toilet? N  In the past six months, have you accidently leaked urine? Y  Do you have problems with loss of bowel control? N  Managing your Medications? N  Managing your Finances? N   Housekeeping or managing your Housekeeping? N    Patient Care Team: Lauree Chandler, NP as PCP - General (Geriatric Medicine)  Indicate any recent Medical Services you may have received from other than [facility] providers in the past year (date may be approximate).     Assessment:   This is a routine wellness examination for Melissa Simpson.  Hearing/Vision screen No results found.  Dietary issues and exercise activities discussed: Current Exercise Habits: The patient does not participate in regular exercise at present   Goals Addressed   None    Depression Screen    09/24/2022   10:09 AM 10/02/2021   10:01 AM 09/21/2021   11:16 AM 09/22/2020   10:10 AM 09/20/2020   10:36 AM 09/21/2019   10:43 AM 08/28/2019    8:39 AM  PHQ 2/9 Scores  PHQ - 2 Score 0 0 0 0 0 0 0    Fall Risk    09/24/2022   10:09 AM 05/07/2022    9:56 AM 10/02/2021   10:01 AM 09/21/2021   11:17 AM 09/22/2020   10:10 AM  Fall Risk   Falls in the past year? 0 0 0 0 1  Number falls in past yr: 0 0 0 0 0  Injury with Fall? 0 0 0 0 0  Risk for fall due to :  No Fall Risks No Fall Risks  Follow up   Falls evaluation completed Falls evaluation completed     FALL RISK PREVENTION PERTAINING TO THE HOME:  Any stairs in or around the home? No  If so, are there any without handrails?  na Home free of loose throw rugs in walkways, pet beds, electrical cords, etc? Yes  Adequate lighting in your home to reduce risk of falls? Yes   ASSISTIVE DEVICES UTILIZED TO PREVENT FALLS:  Life alert? No  Use of a cane, walker or w/c? No  Grab bars in the bathroom? Yes  Shower chair or bench in shower? Yes  Elevated toilet seat or a handicapped toilet? Yes   TIMED UP AND GO:  Was the test performed? No .    Cognitive Function:    09/24/2022   10:15 AM 09/15/2018   11:47 AM 09/05/2017    9:40 AM 08/30/2016    2:12 PM  MMSE - Mini Mental State Exam  Orientation to time '5 5 5 5  '$ Orientation to Place '5 5 5 5  '$ Registration  '3 3 3 3  '$ Attention/ Calculation '5 5 5 5  '$ Recall '3 3 2 3  '$ Language- name 2 objects '2 2 2 2  '$ Language- repeat '1 1 1 1  '$ Language- follow 3 step command '3 3 3 3  '$ Language- read & follow direction '1 1 1 1  '$ Write a sentence '1 1 1 1  '$ Copy design '1 1 1 1  '$ Total score '30 30 29 30        '$ 09/21/2021   11:19 AM 09/20/2020   10:41 AM  6CIT Screen  What Year? 0 points 0 points  What month? 0 points 0 points  What time? 0 points 0 points  Count back from 20 0 points 0 points  Months in reverse 0 points 0 points  Repeat phrase 0 points 0 points  Total Score 0 points 0 points    Immunizations Immunization History  Administered Date(s) Administered   Influenza, High Dose Seasonal PF 06/10/2017, 06/09/2018, 06/03/2019, 07/11/2020   Influenza-Unspecified 06/11/2014, 05/27/2015, 06/27/2016, 05/18/2021, 06/29/2022   PFIZER(Purple Top)SARS-COV-2 Vaccination 04/12/2020, 05/03/2020, 10/27/2020, 08/09/2022   Pneumococcal Conjugate-13 07/06/2014   Pneumococcal Polysaccharide-23 08/29/2015   RSV,unspecified 08/09/2022   Tdap 06/23/2012   Zoster Recombinat (Shingrix) 09/18/2018, 12/22/2018    TDAP status: Due, Education has been provided regarding the importance of this vaccine. Advised may receive this vaccine at local pharmacy or Health Dept. Aware to provide a copy of the vaccination record if obtained from local pharmacy or Health Dept. Verbalized acceptance and understanding.  Flu Vaccine status: Up to date  Pneumococcal vaccine status: Up to date  Covid-19 vaccine status: Information provided on how to obtain vaccines.   Qualifies for Shingles Vaccine? Yes   Zostavax completed No   Shingrix Completed?: Yes  Screening Tests Health Maintenance  Topic Date Due   DTaP/Tdap/Td (2 - Td or Tdap) 06/23/2022   COVID-19 Vaccine (5 - 2023-24 season) 10/04/2022   Medicare Annual Wellness (AWV)  09/25/2023   MAMMOGRAM  01/03/2024   COLONOSCOPY (Pts 45-19yr Insurance coverage will need to be  confirmed)  11/06/2026   Pneumonia Vaccine 72 Years old  Completed   INFLUENZA VACCINE  Completed   DEXA SCAN  Completed   Hepatitis C Screening  Completed   Zoster Vaccines- Shingrix  Completed   HPV VACCINES  Aged Out    Health Maintenance  Health Maintenance Due  Topic Date Due   DTaP/Tdap/Td (2 - Td or Tdap) 06/23/2022  Colorectal cancer screening: Type of screening: Colonoscopy. Completed 2018. Repeat every 10 years  Mammogram status: Completed 4/23. Repeat every year  Bone Density status: Completed 6/23. Results reflect: Bone density results: OSTEOPOROSIS. Repeat every 2 years.  Lung Cancer Screening: (Low Dose CT Chest recommended if Age 26-80 years, 30 pack-year currently smoking OR have quit w/in 15years.) does not qualify.   Lung Cancer Screening Referral: na  Additional Screening:  Hepatitis C Screening: does qualify; Completed 2020  Vision Screening: Recommended annual ophthalmology exams for early detection of glaucoma and other disorders of the eye. Is the patient up to date with their annual eye exam?  No  Who is the provider or what is the name of the office in which the patient attends annual eye exams? [facility] If pt is not established with a provider, would they like to be referred to a provider to establish care? No .   Dental Screening: Recommended annual dental exams for proper oral hygiene  Community Resource Referral / Chronic Care Management: CRR required this visit?   no  CCM required this visit?  No      Plan:     I have personally reviewed and noted the following in the patient's chart:   Medical and social history Use of alcohol, tobacco or illicit drugs  Current medications and supplements including opioid prescriptions. Patient is not currently taking opioid prescriptions. Functional ability and status Nutritional status Physical activity Advanced directives List of other physicians Hospitalizations, surgeries, and ER  visits in previous 12 months Vitals Screenings to include cognitive, depression, and falls Referrals and appointments  In addition, I have reviewed and discussed with patient certain preventive protocols, quality metrics, and best practice recommendations. A written personalized care plan for preventive services as well as general preventive health recommendations were provided to patient.     Lauree Chandler, NP   09/24/2022   Place of service: [facility]

## 2022-09-25 ENCOUNTER — Encounter: Payer: Medicare HMO | Admitting: Nurse Practitioner

## 2022-09-25 LAB — LIPID PANEL
Cholesterol: 148 mg/dL (ref <200)
HDL: 52 mg/dL (ref >=50)
LDL Cholesterol (Calc): 70 mg/dL (calc)
Non-HDL Cholesterol (Calc): 96 mg/dL (calc) (ref <130)
Total CHOL/HDL Ratio: 2.8 (calc) (ref <5.0)
Triglycerides: 188 mg/dL — ABNORMAL HIGH (ref <150)

## 2022-09-25 LAB — COMPLETE METABOLIC PANEL WITH GFR
AG Ratio: 1.5 (calc) (ref 1.0–2.5)
ALT: 22 U/L (ref 6–29)
AST: 21 U/L (ref 10–35)
Albumin: 4.3 g/dL (ref 3.6–5.1)
Alkaline phosphatase (APISO): 30 U/L — ABNORMAL LOW (ref 37–153)
BUN/Creatinine Ratio: 16 (calc) (ref 6–22)
BUN: 18 mg/dL (ref 7–25)
CO2: 27 mmol/L (ref 20–32)
Calcium: 9.8 mg/dL (ref 8.6–10.4)
Chloride: 105 mmol/L (ref 98–110)
Creat: 1.13 mg/dL — ABNORMAL HIGH (ref 0.60–1.00)
Globulin: 2.9 g/dL (calc) (ref 1.9–3.7)
Glucose, Bld: 94 mg/dL (ref 65–99)
Potassium: 4.4 mmol/L (ref 3.5–5.3)
Sodium: 139 mmol/L (ref 135–146)
Total Bilirubin: 0.9 mg/dL (ref 0.2–1.2)
Total Protein: 7.2 g/dL (ref 6.1–8.1)
eGFR: 51 mL/min/{1.73_m2} — ABNORMAL LOW (ref >=60)

## 2022-09-25 LAB — CBC WITH DIFFERENTIAL/PLATELET
Absolute Monocytes: 572 cells/uL (ref 200–950)
Basophils Absolute: 59 cells/uL (ref 0–200)
Basophils Relative: 1.1 %
Eosinophils Absolute: 130 cells/uL (ref 15–500)
Eosinophils Relative: 2.4 %
HCT: 44.6 % (ref 35.0–45.0)
Hemoglobin: 15.2 g/dL (ref 11.7–15.5)
Lymphs Abs: 1442 cells/uL (ref 850–3900)
MCH: 34.1 pg — ABNORMAL HIGH (ref 27.0–33.0)
MCHC: 34.1 g/dL (ref 32.0–36.0)
MCV: 100 fL (ref 80.0–100.0)
MPV: 9.8 fL (ref 7.5–12.5)
Monocytes Relative: 10.6 %
Neutro Abs: 3197 cells/uL (ref 1500–7800)
Neutrophils Relative %: 59.2 %
Platelets: 286 10*3/uL (ref 140–400)
RBC: 4.46 10*6/uL (ref 3.80–5.10)
RDW: 11.8 % (ref 11.0–15.0)
Total Lymphocyte: 26.7 %
WBC: 5.4 10*3/uL (ref 3.8–10.8)

## 2022-09-25 LAB — HEMOGLOBIN A1C
Hgb A1c MFr Bld: 6.1 % of total Hgb — ABNORMAL HIGH (ref <5.7)
Mean Plasma Glucose: 128 mg/dL
eAG (mmol/L): 7.1 mmol/L

## 2022-10-08 ENCOUNTER — Encounter: Payer: Self-pay | Admitting: Nurse Practitioner

## 2022-10-08 ENCOUNTER — Ambulatory Visit (INDEPENDENT_AMBULATORY_CARE_PROVIDER_SITE_OTHER): Payer: Medicare HMO | Admitting: Nurse Practitioner

## 2022-10-08 VITALS — BP 134/80 | HR 53 | Temp 97.3°F | Ht <= 58 in | Wt 162.0 lb

## 2022-10-08 DIAGNOSIS — N1832 Chronic kidney disease, stage 3b: Secondary | ICD-10-CM | POA: Diagnosis not present

## 2022-10-08 DIAGNOSIS — M810 Age-related osteoporosis without current pathological fracture: Secondary | ICD-10-CM | POA: Diagnosis not present

## 2022-10-08 DIAGNOSIS — E781 Pure hyperglyceridemia: Secondary | ICD-10-CM

## 2022-10-08 DIAGNOSIS — R739 Hyperglycemia, unspecified: Secondary | ICD-10-CM | POA: Diagnosis not present

## 2022-10-08 DIAGNOSIS — H9313 Tinnitus, bilateral: Secondary | ICD-10-CM

## 2022-10-08 DIAGNOSIS — Z8543 Personal history of malignant neoplasm of ovary: Secondary | ICD-10-CM | POA: Diagnosis not present

## 2022-10-08 DIAGNOSIS — I10 Essential (primary) hypertension: Secondary | ICD-10-CM

## 2022-10-08 NOTE — Progress Notes (Signed)
Careteam: Patient Care Team: Lauree Chandler, NP as PCP - General (Geriatric Medicine)  PLACE OF SERVICE:  [facility] Directive information Does Patient Have a Medical Advance Directive?: Yes, Type of Advance Directive: [place];Living will, Does patient want to make changes to medical advance directive?: No - Patient declined  Allergies  Allergen Reactions   Bee Pollen Other (See Comments)   Pollen Extract     Chief Complaint  Patient presents with   Medical Management of Chronic Issues    6 month follow-up. Discuss need for td/tdap and additional covid boosters or post pone if patient refuses      HPI: Patient is a 72 y.o. female here for routine follow-up.   Ears still ringing - saw ENT they tested her hearing and made recommendations on helping with her tinnitus.  Congestion prevents her from sleeping. She takes an antihistamine in the spring for allergies and currently uses a humidifier but doesn't feel the humidifier helps the stuffiness.   BP runs about 130s when she checks it occasionally at home.    Review of Systems:  Review of Systems  Constitutional:  Negative for chills, fever, malaise/fatigue and weight loss.  HENT:  Positive for congestion and tinnitus. Negative for sore throat.        Tinnitus BIL   Eyes:  Negative for blurred vision.  Respiratory:  Negative for cough, shortness of breath and wheezing.   Cardiovascular:  Negative for chest pain, palpitations and leg swelling.  Gastrointestinal:  Negative for abdominal pain, blood in stool, constipation, diarrhea, heartburn, nausea and vomiting.  Genitourinary:  Negative for dysuria, frequency, hematuria and urgency.  Musculoskeletal:  Negative for falls and joint pain.  Skin:  Negative for rash.  Neurological:  Negative for dizziness, tingling and headaches.  Endo/Heme/Allergies:  Negative for polydipsia.  Psychiatric/Behavioral:  Negative for depression. The patient  is not nervous/anxious.     Past Medical History:  Diagnosis Date   AKI (acute kidney injury) ([place]) 05/23/2018   Arthritis    in bilateral knees   Cancer ([place])    left ovarian cancer   Family history of colon cancer    Family history of ovarian cancer    GERD (gastroesophageal reflux disease)    History of DVT (deep vein thrombosis) 2019   had a catheter-related DVT during chemotherapy   Hyperlipidemia    Hypertension    Steroid myopathy 09/22/2018   Past Surgical History:  Procedure Laterality Date   ABDOMINAL HYSTERECTOMY     BIOPSY THYROID  2008   COLONOSCOPY     IR CV LINE INJECTION  07/15/2018   IR REMOVAL TUN ACCESS W/ PORT W/O FL MOD SED  11/24/2018   TONSILECTOMY, ADENOIDECTOMY, BILATERAL MYRINGOTOMY AND TUBES Bilateral 1957   Social History:   reports that she has never smoked. She has never used smokeless tobacco. She reports that she does not drink alcohol and does not use drugs.  Family History  Problem Relation Age of Onset   Hypertension Mother    Arthritis Mother    Hypertension Father    Diabetes Father    Coronary artery disease Father    Hyperlipidemia Father    Colon cancer Father        71's   HLA-B27 positive Daughter    Cancer Maternal Aunt        type unk   Ovarian cancer Cousin 1   Cancer Cousin        mouth  Medications: Patient's Medications  New Prescriptions   No medications on file  Previous Medications   ABRYSVO 120 MCG/0.5ML INJECTION    Inject 0.5 mLs into the muscle once.   ALENDRONATE (FOSAMAX) 70 MG TABLET    TAKE 1 TABLET EVERY WEEK WITH A FULL GLASS OF WATER ON AN EMPTY STOMACH   ASCORBIC ACID (VITAMIN C PO)    Take 500 mg by mouth daily.   ASPIRIN EC 81 MG TABLET    Take 81 mg by mouth daily. Swallow whole.   BLACK COHOSH PO    Take 1 capsule by mouth daily.   CALCIUM-MAGNESIUM-VITAMIN D (CALCIUM 1200+D3 PO)       DIPHENHYDRAMINE (BENADRYL) 25 MG TABLET    Take 25 mg by mouth at bedtime.   GLUCOSAMINE HCL ([facility]S  GLUCOSAMINE PO)    2 tabs by mouth in the morning and 1 tab by mouth at night   LOSARTAN (COZAAR) 50 MG TABLET    TAKE 1 TABLET EVERY DAY   METOPROLOL TARTRATE (LOPRESSOR) 50 MG TABLET    TAKE 1 TABLET TWICE DAILY   MULTIPLE VITAMIN (MULTIVITAMIN) TABLET    Take 1 tablet by mouth 2 (two) times daily.    OMEGA-3 FATTY ACIDS (OMEGA-3 FISH OIL PO)    Take 1 tablet by mouth 2 (two) times daily.   OMEPRAZOLE (PRILOSEC) 20 MG CAPSULE    TAKE 1 CAPSULE EVERY DAY   PROBIOTIC PRODUCT (PROBIOTIC-10 PO)       SIMVASTATIN (ZOCOR) 20 MG TABLET    TAKE 1 TABLET EVERY DAY  Modified Medications   No medications on file  Discontinued Medications   COMIRNATY SYRINGE    Inject 0.3 mLs into the muscle once.    Physical Exam:  Vitals:   10/08/22 0827  BP: 134/80  Pulse: (!) 53  Temp: (!) 97.3 F (36.3 C)  TempSrc: Temporal  SpO2: 95%  Weight: 162 lb (73.5 kg)  Height: '4\' 10"'$  (1.473 m)   Body mass index is 33.86 kg/m. Wt Readings from Last 3 Encounters:  10/08/22 162 lb (73.5 kg)  09/24/22 164 lb (74.4 kg)  05/07/22 161 lb 12.8 oz (73.4 kg)    Physical Exam Vitals and nursing note reviewed. Exam conducted with a chaperone present.  Constitutional:      General: She is not in acute distress.    Appearance: Normal appearance.  Cardiovascular:     Rate and Rhythm: Normal rate and regular rhythm.  Pulmonary:     Effort: No respiratory distress.     Breath sounds: Normal breath sounds.  Abdominal:     General: Bowel sounds are normal. There is no distension.     Palpations: Abdomen is soft. There is no mass.     Tenderness: There is no abdominal tenderness. There is no guarding.  Musculoskeletal:     Cervical back: Neck supple.  Lymphadenopathy:     Cervical: No cervical adenopathy.  Skin:    General: Skin is warm and dry.  Neurological:     Mental Status: She is alert and oriented to person, place, and time.  Psychiatric:        Mood and Affect: Mood normal.     Labs  reviewed: Basic Metabolic Panel: Recent Labs    11/06/21 1151 05/07/22 1050 09/24/22 1037  NA 139 139 139  K 4.2 4.0 4.4  CL 103 102 105  CO2 '25 25 27  '$ GLUCOSE 99 101* 94  BUN '20 21 18  '$ CREATININE 1.09* 1.09* 1.13*  CALCIUM 9.8 9.6 9.8   Liver Function Tests: Recent Labs    05/07/22 1050 09/24/22 1037  AST 21 21  ALT 20 22  BILITOT 0.9 0.9  PROT 7.0 7.2   No results for input(s): "LIPASE", "AMYLASE" in the last 8760 hours. No results for input(s): "AMMONIA" in the last 8760 hours. CBC: Recent Labs    05/07/22 1050 09/24/22 1037  WBC 5.0 5.4  NEUTROABS 2,515 3,197  HGB 15.2 15.2  HCT 43.9 44.6  MCV 97.8 100.0  PLT 257 286   Lipid Panel: Recent Labs    05/07/22 1050 09/24/22 1037  CHOL 152 148  HDL 49* 52  LDLCALC 68 70  TRIG 267* 188*  CHOLHDL 3.1 2.8   TSH: No results for input(s): "TSH" in the last 8760 hours. A1C: Lab Results  Component Value Date   HGBA1C 6.1 (H) 09/24/2022     Assessment/Plan   1. Pure hypertriglyceridemia LDL at goal, Encouraged patient to engage in diet and lifestyle modifications. Continue simvastatin.  - Lipid panel; Future - COMPLETE METABOLIC PANEL WITH GFR; Future  2. Essential hypertension, benign Blood pressure well controlled, goal bp <140/90 Continue metoprolol and losartan and dietary modifications follow metabolic panel - COMPLETE METABOLIC PANEL WITH GFR; Future - CBC with Differential/Platelet; Future  3. Stage 3b chronic kidney disease (HCC) Chronic and stable Encourage proper hydration Follow metabolic panel Avoid nephrotoxic meds (NSAIDS)  4. Hyperglycemia Discussed and encouraged diet and lifestyle modifications with patient.  - Hemoglobin A1c; Future   5. History of ovarian cancer S/p surgery and chemo, followed by oncology for ongoing surveillance.   6. Osteoporosis without current pathological fracture, unspecified osteoporosis type -Recommended to take calcium 600 mg twice daily with  Vitamin D 2000 units daily and weight bearing activity 30 mins/5 days a week -continues on fosamax weekly  7. Tinnitus of both ears Ongoing started after her chemo, has been seen by ENT, tinnitus precautions were advised.    Return in about 6 months (around 04/08/2023).:   Student- Archer Asa O'Berry ACPCNP-S  I personally was present during the history, physical exam and medical decision-making activities of this service and have verified that the service and findings are accurately documented in the student's note Carla Rashad K. [facility], [facility] [phone]

## 2022-10-19 ENCOUNTER — Telehealth: Payer: Self-pay

## 2022-10-19 NOTE — Telephone Encounter (Signed)
Pt asked for Thursdays only, appointment moved to Joylene John NP schedule for 3/14, and lab before appointment

## 2022-10-19 NOTE — Telephone Encounter (Signed)
Pt called to schedule her follow up with Dr.Tucker. She states she is supposed to be seen this month. First available is April 11. She states she usually has a lab appointment before her appointment. No future lab order placed by Dr.Tucker. Please advise.

## 2022-10-19 NOTE — Telephone Encounter (Signed)
Please schedule the patient with Melissa on a day I'm here

## 2022-11-21 ENCOUNTER — Encounter: Payer: Self-pay | Admitting: Nurse Practitioner

## 2022-11-22 ENCOUNTER — Inpatient Hospital Stay: Payer: Medicare HMO | Admitting: Gynecologic Oncology

## 2022-11-22 ENCOUNTER — Inpatient Hospital Stay: Payer: Medicare HMO | Attending: Gynecologic Oncology | Admitting: Gynecologic Oncology

## 2022-11-22 ENCOUNTER — Encounter: Payer: Self-pay | Admitting: Gynecologic Oncology

## 2022-11-22 VITALS — BP 162/83 | HR 58 | Temp 98.3°F | Resp 18 | Ht <= 58 in | Wt 163.0 lb

## 2022-11-22 DIAGNOSIS — Z1502 Genetic susceptibility to malignant neoplasm of ovary: Secondary | ICD-10-CM | POA: Insufficient documentation

## 2022-11-22 DIAGNOSIS — Z148 Genetic carrier of other disease: Secondary | ICD-10-CM | POA: Insufficient documentation

## 2022-11-22 DIAGNOSIS — Z1501 Genetic susceptibility to malignant neoplasm of breast: Secondary | ICD-10-CM | POA: Insufficient documentation

## 2022-11-22 DIAGNOSIS — Z9221 Personal history of antineoplastic chemotherapy: Secondary | ICD-10-CM | POA: Diagnosis not present

## 2022-11-22 DIAGNOSIS — Z8543 Personal history of malignant neoplasm of ovary: Secondary | ICD-10-CM | POA: Diagnosis not present

## 2022-11-22 DIAGNOSIS — C562 Malignant neoplasm of left ovary: Secondary | ICD-10-CM | POA: Diagnosis not present

## 2022-11-22 DIAGNOSIS — Z1509 Genetic susceptibility to other malignant neoplasm: Secondary | ICD-10-CM | POA: Diagnosis not present

## 2022-11-22 DIAGNOSIS — Z90722 Acquired absence of ovaries, bilateral: Secondary | ICD-10-CM | POA: Insufficient documentation

## 2022-11-22 DIAGNOSIS — Z90710 Acquired absence of both cervix and uterus: Secondary | ICD-10-CM | POA: Insufficient documentation

## 2022-11-22 NOTE — Patient Instructions (Signed)
It was good to see you today.  I do not see or feel any evidence of cancer recurrence on your exam.  I will see you for follow-up in 6 months.  Please call in July to see a visit to see me in September.  As always, if you develop any new and concerning symptoms before your next visit, please call to see me sooner.

## 2022-11-22 NOTE — Progress Notes (Signed)
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 Large pelvic mass with heterogeneous appearance, measuring at least 15 centimeters in diameter. Although the findings favor enlarged uterus/fibroids, ovaries are not well seen and an ovarian mass should also be considered. Further characterization with pelvic ultrasound is recommended. 2. The bladder is completely decompressed. There is LEFT-sided hydronephrosis secondary to extrinsic compression from the pelvic mass. 3. Irregular thickening of the sigmoid colon, suspicious for colonic wall mass, measuring 1.3 centimeters. In this patient with a family history of colon cancer, further evaluation with colonoscopy is recommended. 4. Colonic diverticula without acute diverticulitis. 5. Moderate hiatal hernia.     05/15/2018 Imaging   MRI pelvis 16 cm complex cystic and solid mass which is centered in the left posterior adnexa and is contiguous with the uterus. This is suspicious for ovarian carcinoma, with differential diagnosis of atypical pedunculated fibroid with peripheral cystic degeneration. Surgical evaluation is recommended.   Other uterine fibroids measuring up to 4.8 cm.   No evidence of pelvic metastatic disease.     05/16/2018 Imaging   US pelvis 1. Large pelvic mass described in detail on MRI examination 1 day prior. 2. Slight ureteral prominence bilaterally. Mild fullness of the left renal collecting system. No obstructing focus evident. Suspect fullness of these structures due to  ureteral compression by the sizable pelvic mass.     05/22/2018 Tumor Marker   Patient's tumor was tested for the following markers: CA-125 Results of the tumor marker test revealed 122   05/27/2018 Surgery   Preoperative Diagnoses: Pelvic mass  Postoperative Diagnoses: left fallopian tube and ovary consistent with high-grade carcinoma of likely GYN origin. No evidence of metastatic disease.  Procedures: Exploratory laparotomy, total abdominal hysterectomy, bilateral salpingo-oophorectomy, peritoneal biopsies for ovarian cancer staging, bilateral pelvic lymphadenectomy, bilateral para-aortic lymphadenectomy, infracolic omentectomy, R0 resection   Surgeon: Marti Sleigh, MD  Assistants: Tona Sensing, MD - Fellow, Haroldine Laws, MD - Resident, Feliberto Harts, PA-S  Findings: On BME, the cervix is retracted anteriorly with no palpable adnexal fullness. There is no RV septum nodularity. Abdominal exam reveals a mobile large mass that extends to the umbilicus and nearly to the pelvic side walls. On abdominal entry, there was immediate green-brown pelvic fluid that appeared consistent with a spontaneous rupture of the cystic fluid-filled mass. The left ovary was a large cystic complex mass, roughly 15cm in size with smooth surface but containing friable tumor. The mass was densely adherent along the entire posterior wall of the uterus. The uterus had some small intramural fibroids and was roughly 10cm in total size. The right fallopian tube and ovary were normal appearing. The patient has smooth paracolic gutters, liver edge, diaphragm, spleen and stomach. The omentum was retracted but no evidence of nodularity or gross tumor. The anterior cul-de-sac was clear and the posterior-cul-de-sac peritoneum was clear, but the left ovarian mass wall was adherent to the rectosigmoid and cul-de-sac. There were no palpable pelvic or para-aortic adenopathy.   Of note, an the conclusion of the case, there  was mild  RIGHT hydronephrosis noted. The ureter was traced from the level of the right common iliac into the anterior utero-vesical ligament. There was an area at the pelvic brim were the hydronephrosis resolved to normal caliber. There was no tethering, stricture, or apparent injury to the ureter. Based on exam, this seemed consistent with packing and retraction causing some transient dilation that resolved with packing and retractor removal.   Intraoperative frozen section of the left ovary was consistent with high-grade carcinoma of GYN origin, possibly serous. There was no gross evidence of disease at the conclusion of the case, R0 resection.  Specimens: 1) Pelvic washings 2) Uterus with cervix  3) Left ovary and fallopian tube, IOFS c/w high-grade GYN carcinoma 4) Right fallopian tube and ovary 5) Peritoneal biopsies - anterior cul-de-sac peritoneum, posterior cul-de-sac peritoneum, left pelvic side wall peritoneum, right pelvic side wall peritoneum 6) Right and left pelvic lymph nodes 7) Right and left para-aortic lymph nodes 8) Omentum 9) Anaerobic/Aerobic culture - preliminary read is no organisms    05/27/2018 Pathology Results   A: Ovary and fallopian tube, left, salpingo-oophorectomy - Clear cell carcinoma of left ovary, size 14.0 cm, with necrosis  - Carcinoma is adherent to fallopian tube, consistent with surface involvement (stage pT1c2) - Focal endometriosis present - See synoptic report and comment  B: Uterus with cervix and right ovary and fallopian tube, hysterectomy and right salpingo-oophorectomy Cervix: Benign with Nabothian cysts Endometrium: Endometrial polyp (size 3.5 cm); inactive endometrium with cystic glandular changes Myometrium: Leiomyomata with hyalinization and calcification (size up to 4.2 cm); focal adenomyosis  Right ovary: Benign physiologic changes Right fallopian tube: Small paratubal cyst and no malignancy identified  C: Lymph nodes, right pelvic,  regional resection - Nine lymph nodes with no metastatic carcinoma identified (0/9)  D: Lymph nodes, left pelvic, regional resection - Five lymph nodes with no metastatic carcinoma identified (0/5)  E: Cul-de-sac, anterior, biopsy - Fibroadipose tissue with no carcinoma identified  F: Cul-de-sac, posterior, biopsy - Fibrous tissue with inflammation and no carcinoma identified  G: Pelvic sidewall, right, biopsy - Fibroadipose tissue with focal crushed CD10 positive cells, cannot exclude endometrial type stroma - No carcinoma identified  H: Pelvic sidewall, left, biopsy - Fibroadipose tissue with focal crushed cells, cannot exclude endometrial type stroma - No carcinoma identified  I: Omentum, omentectomy - Adipose and fibrovascular tissue consistent with omentum - No carcinoma identified  J: Lymph nodes, left periaortic, regional resection - Two small lymph nodes with no metastatic carcinoma identified (0/2)  K: Lymph nodes, right periaortic, regional resection - Three small lymph nodes with no metastatic carcinoma identified (0/3)  This electronic signature is attestation that the pathologist personally reviewed the submitted material(s) and the final diagnosis reflects that evaluation.     Synoptic Report     OVARY or FALLOPIAN TUBE or PRIMARY PERITONEUM  (Ovary FT Perit - All Specimens)    SPECIMEN    Procedure:    Total hysterectomy and bilateral salpingo-oophorectomy     Procedure:    Omentectomy     Procedure:    Peritoneal  biopsies     Procedure:    Peritoneal washing   :        Specimen Integrity of Left Ovary:    Received largely intact with multiple areas of disruption   Tumor Site:    Left ovary   Histologic Type:    Clear cell carcinoma   Histologic Grade:    High grade   :  Tumor Size:    Greatest dimension in Centimeters (cm): 14.0 Centimeters (cm)      Additional Dimension in Centimeters (cm):    10 Centimeters (cm)      Additional Dimension in  Centimeters (cm):    7 Centimeters (cm)  Tumor Extent:        Ovarian Surface Involvement:    Present       Laterality:    Left     Other Tissue / Organ Involvement:    Not identified     Peritoneal Ascitic Fluid:    Negative for malignancy (normal / benign)     Pleural Fluid:    Not submitted / unknown   Accessory Findings:      LYMPH NODES  Regional Lymph Nodes:    All lymph nodes negative for tumor cells   Number of Lymph Nodes Examined:    19     Site(s):    Right pelvic: 9     Site(s):    Left pelvic: 5     Site(s):    Right para-aortic: 3     Site(s):    Left para-aortic: 2   PATHOLOGIC STAGE CLASSIFICATION (pTNM, AJCC 8th Edition)  Primary Tumor (pT):    pT1c2   Regional Lymph Nodes (pN):    pN0   FIGO STAGE  FIGO Stage:    IC2   ADDITIONAL FINDINGS  Additional Pathologic Findings:    Endometriosis   Additional Pathologic Findings:    Benign fallopian tube with adherence to carcinoma; also see additional parts   Comment(s)  Comment(s):    Also see diagnosis comment.      The frozen section diagnosis is confirmed for specimen A. Sections demonstrate a carcinoma with tubulocystic and papillary architecture with hyalinized cores, lined by cells with high grade nuclei, clear to eosinophilic cytoplasm, and nuclear hobnailing.  The morphologic features are most suggestive of clear cell carcinoma.  Immunohistochemical stains are performed, and demonstrate that the carcinoma is positive for CK7 and PAX8, consistent with gynecologic origin. It has patchy positivity for Napsin A, wild type p53 staining, and is negative for ER, supporting the diagnosis of clear cell carcinoma.   Sections of the tumor demonstrate carcinoma with adherence to fallopian tube surface, consistent with surface involvement.  Per the operative note, findings were also suggestive of preoperative rupture of the mass.  Given both these findings, the tumor is staged as a pT1c2 (FIGO IC2)            05/27/2018  Genetic Testing   Patient has genetic testing done for genetic disorder Results revealed patient has the following mutation(s): BRCA 1 on tissue block, neg blood   06/04/2018 Procedure   She had port placement   06/05/2018 Cancer Staging   Staging form: Ovary, Fallopian Tube, and Primary Peritoneal Carcinoma, AJCC 8th Edition - Pathologic: FIGO Stage IC2, calculated as Stage IC (pT1c2, pN0, cM0) - Signed by Heath Lark, MD on 06/05/2018   06/27/2018 Imaging   1. No evidence of residual/recurrent tumor in the pelvis. 2. No evidence of metastatic disease in the chest, abdomen or pelvis. 3. Left pelvic sidewall 6.3 x 5.0 cm simple fluid collection, compatible with postsurgical seroma or lymphocele. 4. Small hiatal hernia. 5.  Aortic Atherosclerosis (ICD10-I70.0).   06/27/2018 Tumor Marker   Patient's tumor was tested for the following markers: CA-125 Results of the tumor marker test revealed 90.3   07/01/2018 - 10/14/2018 Chemotherapy   The patient had palonosetron (ALOXI) injection 0.25 mg,  0.25 mg, Intravenous,  Once, 6 of 6 cycles  Administration: 0.25 mg (07/01/2018), 0.25 mg (07/22/2018), 0.25 mg (08/12/2018), 0.25 mg (09/02/2018), 0.25 mg (09/23/2018), 0.25 mg (10/14/2018)    CARBOplatin (PARAPLATIN) 420 mg in sodium chloride 0.9 % 250 mL chemo infusion, 420 mg (98.3 % of original dose 425.4 mg), Intravenous,  Once, 6 of 6 cycles  Dose modification:   (original dose 425.4 mg, Cycle 1), 380 mg (original dose 425.4 mg, Cycle 5, Reason: Dose not tolerated), 380 mg (original dose 425.4 mg, Cycle 6, Reason: Dose not tolerated)  Administration: 420 mg (07/01/2018), 440 mg (07/22/2018), 380 mg (08/12/2018), 440 mg (09/02/2018), 380 mg (09/23/2018), 380 mg (10/14/2018)    PACLitaxel (TAXOL) 270 mg in sodium chloride 0.9 % 250 mL chemo infusion (> '80mg'$ /m2), 175 mg/m2 = 270 mg, Intravenous,  Once, 6 of 6 cycles  Administration: 270 mg (07/01/2018), 270 mg (07/22/2018), 270 mg (08/12/2018), 270 mg  (09/02/2018), 270 mg (09/23/2018), 270 mg (10/14/2018)    fosaprepitant (EMEND) 150 mg, dexamethasone (DECADRON) 12 mg in sodium chloride 0.9 % 145 mL IVPB, , Intravenous,  Once, 6 of 6 cycles  Administration:  (07/01/2018),  (07/22/2018),  (08/12/2018),  (09/02/2018),  (09/23/2018),  (10/14/2018)  for chemotherapy treatment.     07/15/2018 Imaging   Vascular US Findings consistent with acute deep vein thrombosis involving the right internal jugular veins. She is placed on Xarelto   07/21/2018 Tumor Marker   Patient's tumor was tested for the following markers: CA-125 Results of the tumor marker test revealed 22.8   09/22/2018 Tumor Marker   Patient's tumor was tested for the following markers: CA-125 Results of the tumor marker test revealed 14.5   11/10/2018 Imaging   Status post hysterectomy and bilateral salpingo-oophorectomy.   4.9 cm improving postoperative seroma along the left pelvic sidewall.   No evidence of recurrent or metastatic disease.     11/10/2018 Tumor Marker   Patient's tumor was tested for the following markers: CA-125 Results of the tumor marker test revealed 12.6   11/24/2018 Procedure   Successful removal of implanted Port-A-Cath.     Interval History: Doing well.  Denies any abdominal or pelvic pain.  Reports baseline bowel bladder function.  Endorses good appetite without nausea or emesis.    Past Medical/Surgical History: Past Medical History:  Diagnosis Date   AKI (acute kidney injury) ([place]) 05/23/2018   Arthritis    in bilateral knees   Cancer ([place])    left ovarian cancer   Family history of colon cancer    Family history of ovarian cancer    GERD (gastroesophageal reflux disease)    History of DVT (deep vein thrombosis) 2019   had a catheter-related DVT during chemotherapy   Hyperlipidemia    Hypertension    Steroid myopathy 09/22/2018    Past Surgical History:  Procedure Laterality Date   ABDOMINAL HYSTERECTOMY     BIOPSY THYROID  2008    COLONOSCOPY     IR CV LINE INJECTION  07/15/2018   IR REMOVAL TUN ACCESS W/ PORT W/O FL MOD SED  11/24/2018   TONSILECTOMY, ADENOIDECTOMY, BILATERAL MYRINGOTOMY AND TUBES Bilateral 1957    Family History  Problem Relation Age of Onset   Hypertension Mother    Arthritis Mother    Hypertension Father    Diabetes Father    Coronary artery disease Father    Hyperlipidemia Father    Colon cancer Father        23's   [id number] positive Daughter  Cancer Maternal Aunt        type unk   Ovarian cancer Cousin 24   Cancer Cousin        mouth    Social History   Socioeconomic History   Marital status: Married    Spouse name: Louie Casa   Number of children: 3   Years of education: Not on file   Highest education level: Not on file  Occupational History   Occupation: retired Solicitor  Tobacco Use   Smoking status: Never   Smokeless tobacco: Never  Vaping Use   Vaping Use: Never used  Substance and Sexual Activity   Alcohol use: No   Drug use: No   Sexual activity: Not on file  Other Topics Concern   Not on file  Social History Narrative   Diet:Unrestricted   Do you drink/eat things with caffeine? Rarely   Marital status: Married                             What year were you married? 1992   Do you live in a house, apartment, assisted living, condo, trailer, etc)? House   Is it one or more stories? 1   How many persons live in your home? 3   Do you have any pets in your home?  2 small dogs   Current or past profession: Chief of Staff   Do you exercise?                                                     Type & how often:   Do you have a living will? No   Do you have a DNR Form? No   Do you have a POA/HPOA forms? NO   Social Determinants of Health   Financial Resource Strain: Low Risk  (09/05/2017)   Overall Financial Resource Strain (CARDIA)    Difficulty of Paying Living Expenses: Not hard at all  Food Insecurity: No Food Insecurity (09/05/2017)   Hunger Vital  Sign    Worried About Running Out of Food in the Last Year: Never true    Ran Out of Food in the Last Year: Never true  Transportation Needs: No Transportation Needs (09/05/2017)   PRAPARE - Hydrologist (Medical): No    Lack of Transportation (Non-Medical): No  Physical Activity: Inactive (09/05/2017)   Exercise Vital Sign    Days of Exercise per Week: 0 days    Minutes of Exercise per Session: 0 min  Stress: No Stress Concern Present (09/05/2017)   [place]    Feeling of Stress : Only a little  Social Connections: Moderately Integrated (09/05/2017)   Social Connection and Isolation Panel [NHANES]    Frequency of Communication with Friends and Family: Never    Frequency of Social Gatherings with Friends and Family: More than three times a week    Attends Religious Services: 1 to 4 times per year    Active Member of [employer] or Organizations: No    Attends Archivist Meetings: Never    Marital Status: Married    Current Medications:  Current Outpatient Medications:    ABRYSVO 120 MCG/0.5ML injection, Inject 0.5 mLs into the muscle once., Disp: , Rfl:  alendronate (FOSAMAX) 70 MG tablet, TAKE 1 TABLET EVERY WEEK WITH A FULL GLASS OF WATER ON AN EMPTY STOMACH, Disp: 12 tablet, Rfl: 3   Ascorbic Acid (VITAMIN C PO), Take 500 mg by mouth daily., Disp: , Rfl:    aspirin EC 81 MG tablet, Take 81 mg by mouth daily. Swallow whole., Disp: , Rfl:    BLACK COHOSH PO, Take 1 capsule by mouth daily., Disp: , Rfl:    Calcium-Magnesium-Vitamin D (CALCIUM 1200+D3 PO), , Disp: , Rfl:    diphenhydrAMINE (BENADRYL) 25 MG tablet, Take 25 mg by mouth at bedtime., Disp: , Rfl:    Glucosamine HCl ([employer] GLUCOSAMINE PO), 2 tabs by mouth in the morning and 1 tab by mouth at night, Disp: , Rfl:    losartan (COZAAR) 50 MG tablet, TAKE 1 TABLET EVERY DAY, Disp: 90 tablet, Rfl: 3   metoprolol tartrate  (LOPRESSOR) 50 MG tablet, TAKE 1 TABLET TWICE DAILY, Disp: 180 tablet, Rfl: 3   Multiple Vitamin (MULTIVITAMIN) tablet, Take 1 tablet by mouth 2 (two) times daily. , Disp: , Rfl:    Omega-3 Fatty Acids (OMEGA-3 FISH OIL PO), Take 1 tablet by mouth 2 (two) times daily., Disp: , Rfl:    omeprazole (PRILOSEC) 20 MG capsule, TAKE 1 CAPSULE EVERY DAY, Disp: 90 capsule, Rfl: 3   Probiotic Product (PROBIOTIC-10 PO), , Disp: , Rfl:    simvastatin (ZOCOR) 20 MG tablet, TAKE 1 TABLET EVERY DAY, Disp: 90 tablet, Rfl: 3  Review of Systems: + ringing in ears Denies appetite changes, fevers, chills, fatigue, unexplained weight changes. Denies hearing loss, neck lumps or masses, mouth sores, ringing in ears or voice changes. Denies cough or wheezing.  Denies shortness of breath. Denies chest pain or palpitations. Denies leg swelling. Denies abdominal distention, pain, blood in stools, constipation, diarrhea, nausea, vomiting, or early satiety. Denies pain with intercourse, dysuria, frequency, hematuria or incontinence. Denies hot flashes, pelvic pain, vaginal bleeding or vaginal discharge.   Denies joint pain, back pain or muscle pain/cramps. Denies itching, rash, or wounds. Denies dizziness, headaches, numbness or seizures. Denies swollen lymph nodes or glands, denies easy bruising or bleeding. Denies anxiety, depression, confusion, or decreased concentration.  Physical Exam: BP (!) 162/83 (BP Location: Left Arm, Patient Position: Sitting)   Pulse (!) 58   Temp 98.3 F (36.8 C) (Oral)   Resp 18   Ht '4\' 10"'$  (1.473 m)   Wt 163 lb (73.9 kg)   SpO2 97%   BMI 34.07 kg/m  General: Alert, oriented, no acute distress. HEENT: Normocephalic, atraumatic, sclera anicteric. Chest: Clear to auscultation bilaterally.  No wheezes or rhonchi. Cardiovascular: Regular rate and rhythm, no murmurs. Abdomen: Obese, soft, nontender.  Normoactive bowel sounds.  No masses or hepatosplenomegaly appreciated.   Well-healed incisions. Extremities: Grossly normal range of motion.  Warm, well perfused.  No edema bilaterally. Skin: No rashes or lesions noted. Lymphatics: No cervical, supraclavicular, or inguinal adenopathy. GU: Normal appearing external genitalia without erythema, excoriation, or lesions.  Speculum exam reveals atrophic vaginal mucosa, no lesions or masses.  Bimanual exam reveals cuff intact, no nodularity or masses.  Rectovaginal exam confirms these findings.  Laboratory & Radiologic Studies: Component Ref Range & Units 7 mo ago (04/16/22) 1 yr ago (11/03/21) 1 yr ago (04/26/21) 2 yr ago (10/25/20) 2 yr ago (07/26/20) 2 yr ago (04/20/20) 2 yr ago (01/26/20)  Cancer Antigen (CA) 125 0.0 - 38.1 U/mL 16.0 16.2 CM 16.3 CM 14.2 CM 12.6 CM 15.4 CM 13.8 C  Assessment & Plan: Melissa Simpson is a 72 y.o. woman with Stage IC2 status post staging surgery in 05/2018 and completion of adjuvant chemotherapy in 10/2018 who presents for surveillance. Somatic BRCA1 mutation.   The patient is doing well and continues to be NED on exam.  She had her CA-125 drawn today and it will release to her tomorrow in [place].  She denies any significant right lower back pain since her last visit.   We will continue with visits every 6 months until 5 years.  She will call over the summer to schedule a visit to see me in September.  We discussed today that once she is 5 years out from treatment, we will transition to yearly visits that can be with her OB/GYN.  We discussed signs and symptoms that if she were to develop should prompt a phone call to be seen sooner.  20 minutes of total time was spent for this patient encounter, including preparation, face-to-face counseling with the patient and coordination of care, and documentation of the encounter.  Jeral Pinch, MD  Division of Gynecologic Oncology  Department of Obstetrics and Gynecology  [facility]

## 2022-11-24 LAB — CA 125: Cancer Antigen (CA) 125: 18.2 U/mL (ref 0.0–38.1)

## 2022-12-20 ENCOUNTER — Ambulatory Visit: Payer: Medicare HMO | Admitting: Gynecologic Oncology

## 2022-12-20 ENCOUNTER — Other Ambulatory Visit: Payer: Medicare HMO

## 2023-01-09 DIAGNOSIS — Z1231 Encounter for screening mammogram for malignant neoplasm of breast: Secondary | ICD-10-CM | POA: Diagnosis not present

## 2023-02-27 ENCOUNTER — Other Ambulatory Visit (INDEPENDENT_AMBULATORY_CARE_PROVIDER_SITE_OTHER): Payer: Medicare HMO

## 2023-02-27 ENCOUNTER — Encounter: Payer: Self-pay | Admitting: Orthopedic Surgery

## 2023-02-27 ENCOUNTER — Ambulatory Visit: Payer: Medicare HMO | Admitting: Orthopedic Surgery

## 2023-02-27 DIAGNOSIS — M79604 Pain in right leg: Secondary | ICD-10-CM

## 2023-02-27 DIAGNOSIS — H2513 Age-related nuclear cataract, bilateral: Secondary | ICD-10-CM | POA: Diagnosis not present

## 2023-02-27 DIAGNOSIS — Z0100 Encounter for examination of eyes and vision without abnormal findings: Secondary | ICD-10-CM | POA: Diagnosis not present

## 2023-02-27 DIAGNOSIS — H43393 Other vitreous opacities, bilateral: Secondary | ICD-10-CM | POA: Diagnosis not present

## 2023-02-27 DIAGNOSIS — M1712 Unilateral primary osteoarthritis, left knee: Secondary | ICD-10-CM

## 2023-03-02 ENCOUNTER — Encounter: Payer: Self-pay | Admitting: Orthopedic Surgery

## 2023-03-02 MED ORDER — BUPIVACAINE HCL 0.25 % IJ SOLN
4.0000 mL | INTRAMUSCULAR | Status: AC | PRN
  Administered 2023-02-27: 4 mL via INTRA_ARTICULAR

## 2023-03-02 MED ORDER — LIDOCAINE HCL 1 % IJ SOLN
5.0000 mL | INTRAMUSCULAR | Status: AC | PRN
  Administered 2023-02-27: 5 mL

## 2023-03-02 MED ORDER — METHYLPREDNISOLONE ACETATE 40 MG/ML IJ SUSP
40.0000 mg | INTRAMUSCULAR | Status: AC | PRN
  Administered 2023-02-27: 40 mg via INTRA_ARTICULAR

## 2023-03-02 NOTE — Progress Notes (Signed)
Office Visit Note   Patient: Melissa Simpson           Date of Birth: 06/20/1951           MRN: [id number] Visit Date: 02/27/2023 Requested by: Sharon Seller, NP [address] [place]. [place],  [place] [address] PCP: Sharon Seller, NP  Subjective: Chief Complaint  Patient presents with   Right Hip - Pain   Left Knee - Pain   Left Foot - Pain    HPI: Melissa Simpson is a 71 y.o. female who presents to the office reporting multiple orthopedic complaints today.  Patient describes right hip pain along with left knee pain and left heel pain.  She is using arch supports for her left heel.  The left knee pain comes and goes.  Has been bothering her for a while.  Does wake her from sleep at night.  Pain is very random in the left heel.  She localizes her "hip" pain to the buttock region and trochanteric region.  Fairly minimal groin pain.  Her right-sided pain actually hurts her more than known arthritis which is fairly severe in that left knee.  She rarely uses over-the-counter medications.  She has had prior left knee injections with good relief.  She does report buttock pain along with low back pain but no pain which radiates below the knee.  That is on the right-hand side..                ROS: All systems reviewed are negative as they relate to the chief complaint within the history of present illness.  Patient denies fevers or chills.  Assessment & Plan: Visit Diagnoses:  1. Pain in right leg     Plan: Impression is end-stage left knee arthritis with varus alignment.  Injection performed today.  That has helped her in the past.  On the positive side she does not have much effusion in the knee today.  Regarding her back and hip pain this looks to be more like radiculopathy.  Been going on for well over 3 months.  Failed conservative management.  MRI of the lumbar spine indicated based primarily on absence of groin symptoms with internal and external rotation of the leg along with no real  asymmetric loss of range of motion in the hip.  Follow-up after those studies and we could consider epidural steroid injections for that problem.  Follow-Up Instructions: No follow-ups on file.   Orders:  Orders Placed This Encounter  Procedures   XR HIP UNILAT W OR W/O PELVIS 2-3 VIEWS RIGHT   XR Lumbar Spine 2-3 Views   MR Lumbar Spine w/o contrast   No orders of the defined types were placed in this encounter.     Procedures: Large Joint Inj: L knee on 02/27/2023 3:23 PM Indications: diagnostic evaluation, joint swelling and pain Details: 18 G 1.5 in needle, superolateral approach  Arthrogram: No  Medications: 5 mL lidocaine 1 %; 40 mg methylPREDNISolone acetate 40 MG/ML; 4 mL bupivacaine 0.25 % Outcome: tolerated well, no immediate complications Procedure, treatment alternatives, risks and benefits explained, specific risks discussed. Consent was given by the patient. Immediately prior to procedure a time out was called to verify the correct patient, procedure, equipment, support staff and site/side marked as required. Patient was prepped and draped in the usual sterile fashion.       Clinical Data: No additional findings.  Objective: Vital Signs: There were no vitals taken for this visit.  Physical Exam:  Constitutional: Patient appears well-developed HEENT:  Head: Normocephalic Eyes:EOM are normal Neck: Normal range of motion Cardiovascular: Normal rate Pulmonary/chest: Effort normal Neurologic: Patient is alert Skin: Skin is warm Psychiatric: Patient has normal mood and affect  Ortho Exam: Ortho exam demonstrates normal gait and alignment.  Palpable pedal pulses.  No groin pain in the right or left hip with internal or external rotation of the leg.  Ankle dorsiflexion plantarflexion quad hamstring strength is intact.  No paresthesias L1 S1 bilaterally.  No discrete trochanteric tenderness is noted but she does have a little bit of pain posterior to the trochanter  on the right compared to the left.  No masses palpable in this region.  Left knee has full range of motion with no effusion and stable collateral cruciate ligaments.  Reflexes symmetric bilateral patella and Achilles.  Specialty Comments:  No specialty comments available.  Imaging: No results found.   PMFS History: Patient Active Problem List   Diagnosis Date Noted   History of ovarian cancer 03/21/2020   Allergic sinusitis 03/21/2020   Hyperglycemia 08/13/2018   Genetic testing 07/21/2018   Rhinitis 07/01/2018   Lymphocele after surgical procedure 07/01/2018   Preventive measure 06/09/2018   Essential hypertension, benign 02/28/2017   Left medial knee pain 02/28/2017   Elevated liver function tests 02/27/2016   Hyperlipidemia 02/27/2016   Right shoulder pain 02/27/2016   Chronic right hip pain 02/27/2016   Family history of colon cancer 06/02/2015   Gastroesophageal reflux disease 06/02/2015   Left thyroid nodule 06/02/2015   Dyslipidemia 12/18/2013   Past Medical History:  Diagnosis Date   AKI (acute kidney injury) (HCC) 05/23/2018   Arthritis    in bilateral knees   Cancer (HCC)    left ovarian cancer   Family history of colon cancer    Family history of ovarian cancer    GERD (gastroesophageal reflux disease)    History of DVT (deep vein thrombosis) 2019   had a catheter-related DVT during chemotherapy   Hyperlipidemia    Hypertension    Steroid myopathy 09/22/2018    Family History  Problem Relation Age of Onset   Hypertension Mother    Arthritis Mother    Hypertension Father    Diabetes Father    Coronary artery disease Father    Hyperlipidemia Father    Colon cancer Father        52's   HLA-B27 positive Daughter    Cancer Maternal Aunt        type unk   Ovarian cancer Cousin 76   Cancer Cousin        mouth    Past Surgical History:  Procedure Laterality Date   ABDOMINAL HYSTERECTOMY     BIOPSY THYROID  2008   COLONOSCOPY     IR CV LINE  INJECTION  07/15/2018   IR REMOVAL TUN ACCESS W/ PORT W/O FL MOD SED  11/24/2018   TONSILECTOMY, ADENOIDECTOMY, BILATERAL MYRINGOTOMY AND TUBES Bilateral 1957   Social History   Occupational History   Occupation: retired Architect  Tobacco Use   Smoking status: Never   Smokeless tobacco: Never  Vaping Use   Vaping Use: Never used  Substance and Sexual Activity   Alcohol use: No   Drug use: No   Sexual activity: Not on file

## 2023-03-08 ENCOUNTER — Telehealth: Payer: Self-pay

## 2023-03-08 DIAGNOSIS — R350 Frequency of micturition: Secondary | ICD-10-CM | POA: Diagnosis not present

## 2023-03-08 DIAGNOSIS — N3001 Acute cystitis with hematuria: Secondary | ICD-10-CM | POA: Diagnosis not present

## 2023-03-08 NOTE — Telephone Encounter (Signed)
Patient called stating that she would like something called in for UTI,but she is out of town. I  informed patient that she would have to make an appointment in order for any medication to be sent in. Patient verbalized her understanding and stated that she will try to find someone locally (where she is) to see her.

## 2023-03-19 ENCOUNTER — Ambulatory Visit
Admission: RE | Admit: 2023-03-19 | Discharge: 2023-03-19 | Disposition: A | Discharge status: Home / Self Care | Payer: Medicare HMO | Source: Ambulatory Visit | Attending: Orthopedic Surgery | Admitting: Orthopedic Surgery

## 2023-03-19 DIAGNOSIS — M79604 Pain in right leg: Secondary | ICD-10-CM

## 2023-03-19 DIAGNOSIS — M47816 Spondylosis without myelopathy or radiculopathy, lumbar region: Secondary | ICD-10-CM | POA: Diagnosis not present

## 2023-03-20 ENCOUNTER — Ambulatory Visit: Payer: Medicare HMO | Admitting: Orthopedic Surgery

## 2023-04-02 ENCOUNTER — Telehealth: Payer: Self-pay

## 2023-04-02 NOTE — Telephone Encounter (Signed)
Patient called in to schedule 6 month follow up.  Scheduled patient on 05/17/2023 at 1:00pm and labs at 12:30pm.  Patient confirmed and understood.

## 2023-04-03 ENCOUNTER — Ambulatory Visit: Payer: Medicare HMO | Admitting: Surgical

## 2023-04-03 ENCOUNTER — Other Ambulatory Visit: Payer: Self-pay

## 2023-04-03 DIAGNOSIS — M25551 Pain in right hip: Secondary | ICD-10-CM | POA: Diagnosis not present

## 2023-04-05 ENCOUNTER — Encounter: Payer: Self-pay | Admitting: Surgical

## 2023-04-05 MED ORDER — METHYLPREDNISOLONE ACETATE 40 MG/ML IJ SUSP
40.0000 mg | INTRAMUSCULAR | Status: AC | PRN
Start: 2023-04-03 — End: 2023-04-03
  Administered 2023-04-03: 40 mg via INTRA_ARTICULAR

## 2023-04-05 MED ORDER — LIDOCAINE HCL 1 % IJ SOLN
5.0000 mL | INTRAMUSCULAR | Status: AC | PRN
Start: 2023-04-03 — End: 2023-04-03
  Administered 2023-04-03: 5 mL

## 2023-04-05 MED ORDER — BUPIVACAINE HCL 0.25 % IJ SOLN
4.0000 mL | INTRAMUSCULAR | Status: AC | PRN
Start: 2023-04-03 — End: 2023-04-03
  Administered 2023-04-03: 4 mL via INTRA_ARTICULAR

## 2023-04-05 NOTE — Progress Notes (Signed)
Office Visit Note   Patient: Melissa Simpson           Date of Birth: July 24, 1951           MRN: [id number] Visit Date: 04/03/2023 Requested by: Sharon Seller, NP [address] [place]. [place],  [place] [address] PCP: Sharon Seller, NP  Subjective: Chief Complaint  Patient presents with  . Other     Scan review    HPI: Melissa Simpson is a 72 y.o. female who presents to the office for MRI review. Patient denies any changes in symptoms.  Continues to complain mainly of right-sided buttock and lateral hip pain without any recent improvement.  She states that she has no groin pain.  Is difficult to lay on her right hip at night at times.  She has increased pain with going from sitting position to standing position.  No numbness or tingling.  No burning sensation.  No radiation past the lateral hip.  Not really having a lot of increase in her typical low back pain.  MRI results revealed: MR Lumbar Spine w/o contrast  Result Date: 03/28/2023 CLINICAL DATA:  Low back pain radiating to the left lower extremity. EXAM: MRI LUMBAR SPINE WITHOUT CONTRAST TECHNIQUE: Multiplanar, multisequence MR imaging of the lumbar spine was performed. No intravenous contrast was administered. COMPARISON:  None Available. FINDINGS: Segmentation:  Standard. Alignment:  Physiologic. Vertebrae: No acute fracture, evidence of discitis, or aggressive bone lesion. L3 and L5 vertebral body hemangiomas. Conus medullaris and cauda equina: Conus extends to the T12 level. Conus and cauda equina appear normal. Paraspinal and other soft tissues: No acute paraspinal abnormality. Disc levels: Disc spaces: Disc desiccation throughout the lumbar spine with relative sparing at L5-S1. disc heights are maintained. T12-L1: Minimal broad-based disc bulge. No foraminal or central canal stenosis. L1-L2: No significant disc bulge. No neural foraminal stenosis. No central canal stenosis. L2-L3: Mild broad-based disc bulge. Mild bilateral facet  arthropathy. No foraminal or central canal stenosis. L3-L4: No significant disc bulge. Mild bilateral facet arthropathy with bilateral small facet effusions. No foraminal or central canal stenosis. L4-L5: Mild broad-based disc bulge. Moderate bilateral facet arthropathy. No spinal stenosis. No foraminal stenosis. L5-S1: No significant disc bulge. Mild bilateral facet arthropathy. No foraminal or central canal stenosis. IMPRESSION: 1. Mild lumbar spine spondylosis as described above. 2. No acute osseous injury of the lumbar spine. Electronically Signed   By: Elige Ko M.D.   On: 03/28/2023 05:47                 ROS: All systems reviewed are negative as they relate to the chief complaint within the history of present illness.  Patient denies fevers or chills.  Assessment & Plan: Visit Diagnoses:  1. Greater trochanteric pain syndrome of right lower extremity     Plan: Melissa Simpson is a 72 y.o. female who presents to the office for evaluation of continued lateral hip pain and review of lumbar spine MRI.  She has pain localizing to the lateral hip and the buttock region.  She describes pain that is worse with laying on her side and with going from sitting to standing position.  She has MRI of the lumbar spine demonstrating mild spondylosis but no significant foraminal stenosis or disc bulging to account for patient's symptoms.  Based on her exam today, her symptoms seem to go more along with trochanteric bursitis.  We discussed options available to patient such as trying diagnostic lumbar spine ESI versus  physical therapy versus trochanteric injection.  She would like to proceed with trochanteric injection to see if this will help but if no improvement after about a week after the injection, she will reach out to [place] and we can set her up for lumbar spine ESI.  Injection was administered under ultrasound guidance with care taken to avoid injection into the gluteal tendons.  There is no resistance during  administration the injection.  She tolerated procedure well.  Follow-up as needed but reach out to the office in 1 week if no improvement.  Follow-Up Instructions: No follow-ups on file.   Orders:  Orders Placed This Encounter  Procedures  . US Guided Needle Placement - No Linked Charges   No orders of the defined types were placed in this encounter.     Procedures: Large Joint Inj: R greater trochanter on 04/03/2023 10:14 AM Indications: pain and diagnostic evaluation Details: 22 G 3.5 in needle, ultrasound-guided lateral approach  Arthrogram: No  Medications: 5 mL lidocaine 1 %; 4 mL bupivacaine 0.25 %; 40 mg methylPREDNISolone acetate 40 MG/ML Outcome: tolerated well, no immediate complications Procedure, treatment alternatives, risks and benefits explained, specific risks discussed. Consent was given by the patient. Immediately prior to procedure a time out was called to verify the correct patient, procedure, equipment, support staff and site/side marked as required. Patient was prepped and draped in the usual sterile fashion.     Clinical Data: No additional findings.  Objective: Vital Signs: There were no vitals taken for this visit.  Physical Exam:  Constitutional: Patient appears well-developed HEENT:  Head: Normocephalic Eyes:EOM are normal Neck: Normal range of motion Cardiovascular: Normal rate Pulmonary/chest: Effort normal Neurologic: Patient is alert Skin: Skin is warm Psychiatric: Patient has normal mood and affect  Ortho Exam: Ortho exam demonstrates right leg with negative straight leg raise.  Negative Stinchfield sign.  Negative FADIR sign.  She has no pain with hip range of motion passively.  No calf tenderness.  Negative Homans' sign.  Intact ankle dorsiflexion, plantarflexion, quadricep strength, hamstring strength, hip flexion strength rated 5/5.  She has no tenderness throughout the lumbar spine or SI joints bilaterally.  She has no Trendelenburg  gait.  Able to perform hip abduction without any difficulty when in lateral position.  Also when in lateral position, she has moderate to severe tenderness over the greater trochanter on the right side that is definitively asymmetric compared with the left greater trochanter which is minimally tender.  Specialty Comments:  No specialty comments available.  Imaging: No results found.   PMFS History: Patient Active Problem List   Diagnosis Date Noted  . History of ovarian cancer 03/21/2020  . Allergic sinusitis 03/21/2020  . Hyperglycemia 08/13/2018  . Genetic testing 07/21/2018  . Rhinitis 07/01/2018  . Lymphocele after surgical procedure 07/01/2018  . Preventive measure 06/09/2018  . Essential hypertension, benign 02/28/2017  . Left medial knee pain 02/28/2017  . Elevated liver function tests 02/27/2016  . Hyperlipidemia 02/27/2016  . Right shoulder pain 02/27/2016  . Chronic right hip pain 02/27/2016  . Family history of colon cancer 06/02/2015  . Gastroesophageal reflux disease 06/02/2015  . Left thyroid nodule 06/02/2015  . Dyslipidemia 12/18/2013   Past Medical History:  Diagnosis Date  . AKI (acute kidney injury) (HCC) 05/23/2018  . Arthritis    in bilateral knees  . Cancer (HCC)    left ovarian cancer  . Family history of colon cancer   . Family history of ovarian cancer   .  GERD (gastroesophageal reflux disease)   . History of DVT (deep vein thrombosis) 2019   had a catheter-related DVT during chemotherapy  . Hyperlipidemia   . Hypertension   . Steroid myopathy 09/22/2018    Family History  Problem Relation Age of Onset  . Hypertension Mother   . Arthritis Mother   . Hypertension Father   . Diabetes Father   . Coronary artery disease Father   . Hyperlipidemia Father   . Colon cancer Father        77's  . HLA-B27 positive Daughter   . Cancer Maternal Aunt        type unk  . Ovarian cancer Cousin 80  . Cancer Cousin        mouth    Past Surgical  History:  Procedure Laterality Date  . ABDOMINAL HYSTERECTOMY    . BIOPSY THYROID  2008  . COLONOSCOPY    . IR CV LINE INJECTION  07/15/2018  . IR REMOVAL TUN ACCESS W/ PORT W/O FL MOD SED  11/24/2018  . TONSILECTOMY, ADENOIDECTOMY, BILATERAL MYRINGOTOMY AND TUBES Bilateral 1957   Social History   Occupational History  . Occupation: retired Architect  Tobacco Use  . Smoking status: Never  . Smokeless tobacco: Never  Vaping Use  . Vaping status: Never Used  Substance and Sexual Activity  . Alcohol use: No  . Drug use: No  . Sexual activity: Not on file

## 2023-04-08 ENCOUNTER — Ambulatory Visit (INDEPENDENT_AMBULATORY_CARE_PROVIDER_SITE_OTHER): Payer: Medicare HMO | Admitting: Nurse Practitioner

## 2023-04-08 ENCOUNTER — Encounter: Payer: Self-pay | Admitting: Nurse Practitioner

## 2023-04-08 VITALS — BP 136/92 | HR 48 | Temp 97.9°F | Ht <= 58 in | Wt 160.0 lb

## 2023-04-08 DIAGNOSIS — K219 Gastro-esophageal reflux disease without esophagitis: Secondary | ICD-10-CM | POA: Diagnosis not present

## 2023-04-08 DIAGNOSIS — K148 Other diseases of tongue: Secondary | ICD-10-CM

## 2023-04-08 DIAGNOSIS — I10 Essential (primary) hypertension: Secondary | ICD-10-CM | POA: Diagnosis not present

## 2023-04-08 DIAGNOSIS — E781 Pure hyperglyceridemia: Secondary | ICD-10-CM

## 2023-04-08 DIAGNOSIS — M810 Age-related osteoporosis without current pathological fracture: Secondary | ICD-10-CM

## 2023-04-08 DIAGNOSIS — R739 Hyperglycemia, unspecified: Secondary | ICD-10-CM | POA: Diagnosis not present

## 2023-04-08 DIAGNOSIS — F5101 Primary insomnia: Secondary | ICD-10-CM

## 2023-04-08 LAB — CBC WITH DIFFERENTIAL/PLATELET
Absolute Monocytes: 798 cells/uL (ref 200–950)
Basophils Absolute: 63 cells/uL (ref 0–200)
Basophils Relative: 0.8 %
Eosinophils Absolute: 119 cells/uL (ref 15–500)
Eosinophils Relative: 1.5 %
HCT: 46.5 % — ABNORMAL HIGH (ref 35.0–45.0)
Hemoglobin: 16.1 g/dL — ABNORMAL HIGH (ref 11.7–15.5)
Lymphs Abs: 1904 cells/uL (ref 850–3900)
MCH: 33.7 pg — ABNORMAL HIGH (ref 27.0–33.0)
MCHC: 34.6 g/dL (ref 32.0–36.0)
MCV: 97.3 fL (ref 80.0–100.0)
MPV: 9.6 fL (ref 7.5–12.5)
Monocytes Relative: 10.1 %
Neutro Abs: 5017 cells/uL (ref 1500–7800)
Neutrophils Relative %: 63.5 %
Platelets: 295 10*3/uL (ref 140–400)
RBC: 4.78 10*6/uL (ref 3.80–5.10)
RDW: 12 % (ref 11.0–15.0)
Total Lymphocyte: 24.1 %
WBC: 7.9 10*3/uL (ref 3.8–10.8)

## 2023-04-08 MED ORDER — PANTOPRAZOLE SODIUM 40 MG PO TBEC
40.0000 mg | DELAYED_RELEASE_TABLET | Freq: Every day | ORAL | 1 refills | Status: DC
Start: 2023-04-08 — End: 2023-08-26

## 2023-04-08 NOTE — Patient Instructions (Addendum)
Recommend stopping benadryl  Start melatonin 1 mg by mouth at bedtime - can increase up to 6 mg daily If needed we can call in medication to help with sleep   Stop omeprazole  Start protonix 40 mg daily

## 2023-04-08 NOTE — Progress Notes (Signed)
Careteam: Patient Care Team: Sharon Seller, NP as PCP - General (Geriatric Medicine)  PLACE OF SERVICE:  [facility]  Advanced Directive information Does Patient Have a Medical Advance Directive?: Yes, Type of Advance Directive: Healthcare Power of [place];Living will, Does patient want to make changes to medical advance directive?: No - Patient declined  Allergies  Allergen Reactions   Bee Pollen Other (See Comments)   Pollen Extract     Chief Complaint  Patient presents with   Medical Management of Chronic Issues    6 month follow-up. Discuss need for covid boosters. Congestion and cough x 1 week, no at home covid test performed.      HPI: Patient is a 72 y.o. female for routine follow up  Has had some cough and congestion for about a week. Does not feel bad but has lingered.   Went to orthopedics due to bursitis of right hip. She is doing much better after injection.   Using loratadine as needed for allergies, helps when pollen is high.   Osteoporosis- last bone density was June 2023 On cal, vit d and fosamax.   Continues to have reflux- feels like this has started her congestion.  Used mucinex relief.  Using omeprazole which helps most the time but has a lot of after effects.  Does not feel like it is associated with anything she has eaten.   She has had a lesion on her tongue for several months, dentist recommended seeing the specialist but she did not go.    Review of Systems:  Review of Systems  Constitutional:  Negative for chills, fever and weight loss.  HENT:  Positive for congestion. Negative for tinnitus.   Respiratory:  Positive for cough. Negative for sputum production and shortness of breath.   Cardiovascular:  Negative for chest pain, palpitations and leg swelling.  Gastrointestinal:  Positive for heartburn. Negative for abdominal pain, constipation and diarrhea.  Genitourinary:  Negative for dysuria, frequency and urgency.  Musculoskeletal:   Negative for back pain, falls, joint pain and myalgias.  Skin: Negative.   Neurological:  Negative for dizziness and headaches.  Psychiatric/Behavioral:  Negative for depression and memory loss. The patient does not have insomnia.     Past Medical History:  Diagnosis Date   AKI (acute kidney injury) (HCC) 05/23/2018   Arthritis    in bilateral knees   Cancer (HCC)    left ovarian cancer   Family history of colon cancer    Family history of ovarian cancer    GERD (gastroesophageal reflux disease)    History of DVT (deep vein thrombosis) 2019   had a catheter-related DVT during chemotherapy   Hyperlipidemia    Hypertension    Steroid myopathy 09/22/2018   Past Surgical History:  Procedure Laterality Date   ABDOMINAL HYSTERECTOMY     BIOPSY THYROID  2008   COLONOSCOPY     IR CV LINE INJECTION  07/15/2018   IR REMOVAL TUN ACCESS W/ PORT W/O FL MOD SED  11/24/2018   TONSILECTOMY, ADENOIDECTOMY, BILATERAL MYRINGOTOMY AND TUBES Bilateral 1957   Social History:   reports that she has never smoked. She has never used smokeless tobacco. She reports that she does not drink alcohol and does not use drugs.  Family History  Problem Relation Age of Onset   Hypertension Mother    Arthritis Mother    Hypertension Father    Diabetes Father    Coronary artery disease Father    Hyperlipidemia Father  Colon cancer Father        20's   HLA-B27 positive Daughter    Cancer Maternal Aunt        type unk   Ovarian cancer Cousin 27   Cancer Cousin        mouth    Medications: Patient's Medications  New Prescriptions   No medications on file  Previous Medications   ALENDRONATE (FOSAMAX) 70 MG TABLET    TAKE 1 TABLET EVERY WEEK WITH A FULL GLASS OF WATER ON AN EMPTY STOMACH   ASCORBIC ACID (VITAMIN C PO)    Take 500 mg by mouth daily.   ASPIRIN EC 81 MG TABLET    Take 81 mg by mouth daily. Swallow whole.   BLACK COHOSH PO    Take 1 capsule by mouth daily.   CALCIUM-MAGNESIUM-VITAMIN D  (CALCIUM 1200+D3 PO)       DIPHENHYDRAMINE (BENADRYL) 25 MG TABLET    Take 25 mg by mouth at bedtime.   GLUCOSAMINE HCL ([facility] GLUCOSAMINE PO)    2 tabs by mouth in the morning and 1 tab by mouth at night   LOSARTAN (COZAAR) 50 MG TABLET    TAKE 1 TABLET EVERY DAY   METOPROLOL TARTRATE (LOPRESSOR) 50 MG TABLET    TAKE 1 TABLET TWICE DAILY   MULTIPLE VITAMIN (MULTIVITAMIN) TABLET    Take 1 tablet by mouth 2 (two) times daily.    OMEGA-3 FATTY ACIDS (OMEGA-3 FISH OIL PO)    Take 1 tablet by mouth 2 (two) times daily.   OMEPRAZOLE (PRILOSEC) 20 MG CAPSULE    TAKE 1 CAPSULE EVERY DAY   PROBIOTIC PRODUCT (PROBIOTIC-10 PO)       SIMVASTATIN (ZOCOR) 20 MG TABLET    TAKE 1 TABLET EVERY DAY  Modified Medications   No medications on file  Discontinued Medications   ABRYSVO 120 MCG/0.5ML INJECTION    Inject 0.5 mLs into the muscle once.    Physical Exam:  Vitals:   04/08/23 0921 04/08/23 1001  BP: (!) 138/96 (!) 136/92  Pulse: (!) 48   Temp: 97.9 F (36.6 C)   TempSrc: Temporal   SpO2: 96%   Weight: 160 lb (72.6 kg)   Height: 4\' 10"  (1.473 m)    Body mass index is 33.44 kg/m. Wt Readings from Last 3 Encounters:  04/08/23 160 lb (72.6 kg)  11/22/22 163 lb (73.9 kg)  10/08/22 162 lb (73.5 kg)    Physical Exam Constitutional:      General: She is not in acute distress.    Appearance: She is well-developed. She is not diaphoretic.  HENT:     Head: Normocephalic and atraumatic.     Mouth/Throat:     Tongue: Tongue lesions: round raised area to left side of tongue.     Pharynx: No oropharyngeal exudate.  Eyes:     Conjunctiva/sclera: Conjunctivae normal.     Pupils: Pupils are equal, round, and reactive to light.  Cardiovascular:     Rate and Rhythm: Normal rate and regular rhythm.     Heart sounds: Normal heart sounds.  Pulmonary:     Effort: Pulmonary effort is normal.     Breath sounds: Normal breath sounds.  Abdominal:     General: Bowel sounds are normal.     Palpations:  Abdomen is soft.  Musculoskeletal:     Cervical back: Normal range of motion and neck supple.     Right lower leg: No edema.     Left lower leg:  No edema.  Skin:    General: Skin is warm and dry.  Neurological:     Mental Status: She is alert.  Psychiatric:        Mood and Affect: Mood normal.     Labs reviewed: Basic Metabolic Panel: Recent Labs    05/07/22 1050 09/24/22 1037  NA 139 139  K 4.0 4.4  CL 102 105  CO2 25 27  GLUCOSE 101* 94  BUN 21 18  CREATININE 1.09* 1.13*  CALCIUM 9.6 9.8   Liver Function Tests: Recent Labs    05/07/22 1050 09/24/22 1037  AST 21 21  ALT 20 22  BILITOT 0.9 0.9  PROT 7.0 7.2   No results for input(s): "LIPASE", "AMYLASE" in the last 8760 hours. No results for input(s): "AMMONIA" in the last 8760 hours. CBC: Recent Labs    05/07/22 1050 09/24/22 1037  WBC 5.0 5.4  NEUTROABS 2,515 3,197  HGB 15.2 15.2  HCT 43.9 44.6  MCV 97.8 100.0  PLT 257 286   Lipid Panel: Recent Labs    05/07/22 1050 09/24/22 1037  CHOL 152 148  HDL 49* 52  LDLCALC 68 70  TRIG 267* 188*  CHOLHDL 3.1 2.8   TSH: No results for input(s): "TSH" in the last 8760 hours. A1C: Lab Results  Component Value Date   HGBA1C 6.1 (H) 09/24/2022     Assessment/Plan 1. Hyperglycemia -continue dietary modifications.  - Hemoglobin A1c  2. Essential hypertension, benign -Blood pressure well controlled, goal bp <140/90 Continue current medications and dietary modifications follow metabolic panel - CBC with Differential/Platelet - COMPLETE METABOLIC PANEL WITH GFR  3. Pure hypertriglyceridemia -continue dietary modifications with zocor - COMPLETE METABOLIC PANEL WITH GFR - Lipid panel  4. Osteoporosis without current pathological fracture, unspecified osteoporosis type -Recommended to take calcium 600 mg twice daily with Vitamin D 2000 units daily and weight bearing activity 30 mins/5 days a week Continues fosamax weekly  5. Primary  insomnia Recommended against benadryl due to adverse effects -can use melatonin 1-6 mg by mouth at bedtime  Consider trazodone if this does not work.   6. Gastroesophageal reflux disease, unspecified whether esophagitis present Having breakthrough GERD on omeprazole causing sore throat, cough and congestion.  Will stop at this time and start protonix. Continue dietary modifications  - pantoprazole (PROTONIX) 40 MG tablet; Take 1 tablet (40 mg total) by mouth daily.  Dispense: 90 tablet; Refill: 1  7. Tongue lesion Recommended to see ENT by dentist but did not follow up.  - Ambulatory referral to ENT   Return in about 6 months (around 10/09/2023) for routine follow up.  Janene Harvey. Biagio Borg [facility] [phone]

## 2023-04-29 ENCOUNTER — Telehealth: Payer: Medicare HMO

## 2023-04-29 DIAGNOSIS — R058 Other specified cough: Secondary | ICD-10-CM

## 2023-04-29 MED ORDER — BENZONATATE 100 MG PO CAPS: 100.0000 mg | ORAL_CAPSULE | Freq: Two times a day (BID) | ORAL | 0 refills | Status: DC | PRN

## 2023-04-29 NOTE — Telephone Encounter (Signed)
I spoke with patient and she was informed that medication had been sent to pharmacy and follow up comments from Abbey Chatters, NP

## 2023-04-29 NOTE — Telephone Encounter (Signed)
Will send Rx to pharmacy, if her cough continues to linger over 2 months from start we need to do a chest xray

## 2023-04-29 NOTE — Telephone Encounter (Signed)
Patient called stating that she has been having a cough for 3 weeks and she would like something sent to pharmacy. She states that she mentioned it on her last visit.  Message sent to Abbey Chatters, NP

## 2023-05-03 ENCOUNTER — Telehealth: Payer: Self-pay

## 2023-05-03 NOTE — Telephone Encounter (Signed)
Patient was scheduled on 9/6 at 1pm and Dr Pricilla Holm was not going to be here that afternoon.  Moved patient to 8am.  Patient confirmed and understood. Also, changed lab appointment to afterward at 9am.

## 2023-05-15 ENCOUNTER — Encounter: Payer: Self-pay | Admitting: Gynecologic Oncology

## 2023-05-16 NOTE — Progress Notes (Signed)
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 Large pelvic mass with heterogeneous appearance, measuring at least 15 centimeters in diameter. Although the findings favor enlarged uterus/fibroids, ovaries are not well seen and an ovarian mass should also be considered. Further characterization with pelvic ultrasound is recommended. 2. The bladder is completely decompressed. There is LEFT-sided hydronephrosis secondary to extrinsic compression from the pelvic mass. 3. Irregular thickening of the sigmoid colon, suspicious for colonic wall mass, measuring 1.3 centimeters. In this patient with a family history of colon cancer, further evaluation with colonoscopy is recommended. 4. Colonic diverticula without acute diverticulitis. 5. Moderate hiatal hernia.     05/15/2018 Imaging   MRI pelvis 16 cm complex cystic and solid mass which is centered in the left posterior adnexa and is contiguous with the uterus. This is suspicious for ovarian carcinoma, with differential diagnosis of atypical pedunculated fibroid with peripheral cystic degeneration. Surgical evaluation is recommended.   Other uterine fibroids measuring up to 4.8 cm.   No evidence of pelvic metastatic disease.     05/16/2018 Imaging   US pelvis 1. Large pelvic mass described in detail on MRI examination 1 day prior. 2. Slight ureteral prominence bilaterally. Mild fullness of the left renal collecting system. No obstructing focus evident. Suspect fullness of these structures due to  ureteral compression by the sizable pelvic mass.     05/22/2018 Tumor Marker   Patient's tumor was tested for the following markers: CA-125 Results of the tumor marker test revealed 122   05/27/2018 Surgery   Preoperative Diagnoses: Pelvic mass  Postoperative Diagnoses: left fallopian tube and ovary consistent with high-grade carcinoma of likely GYN origin. No evidence of metastatic disease.  Procedures: Exploratory laparotomy, total abdominal hysterectomy, bilateral salpingo-oophorectomy, peritoneal biopsies for ovarian cancer staging, bilateral pelvic lymphadenectomy, bilateral para-aortic lymphadenectomy, infracolic omentectomy, R0 resection   Surgeon: De Blanch, MD  Assistants: Janalyn Rouse, MD - Fellow, Raynelle Bring, MD - Resident, Lovenia Kim, PA-S  Findings: On BME, the cervix is retracted anteriorly with no palpable adnexal fullness. There is no RV septum nodularity. Abdominal exam reveals a mobile large mass that extends to the umbilicus and nearly to the pelvic side walls. On abdominal entry, there was immediate green-brown pelvic fluid that appeared consistent with a spontaneous rupture of the cystic fluid-filled mass. The left ovary was a large cystic complex mass, roughly 15cm in size with smooth surface but containing friable tumor. The mass was densely adherent along the entire posterior wall of the uterus. The uterus had some small intramural fibroids and was roughly 10cm in total size. The right fallopian tube and ovary were normal appearing. The patient has smooth paracolic gutters, liver edge, diaphragm, spleen and stomach. The omentum was retracted but no evidence of nodularity or gross tumor. The anterior cul-de-sac was clear and the posterior-cul-de-sac peritoneum was clear, but the left ovarian mass wall was adherent to the rectosigmoid and cul-de-sac. There were no palpable pelvic or para-aortic adenopathy.   Of note, an the conclusion of the case, there  was mild  RIGHT hydronephrosis noted. The ureter was traced from the level of the right common iliac into the anterior utero-vesical ligament. There was an area at the pelvic brim were the hydronephrosis resolved to normal caliber. There was no tethering, stricture, or apparent injury to the ureter. Based on exam, this seemed consistent with packing and retraction causing some transient dilation that resolved with packing and retractor removal.   Intraoperative frozen section of the left ovary was consistent with high-grade carcinoma of GYN origin, possibly serous. There was no gross evidence of disease at the conclusion of the case, R0 resection.  Specimens: 1) Pelvic washings 2) Uterus with cervix  3) Left ovary and fallopian tube, IOFS c/w high-grade GYN carcinoma 4) Right fallopian tube and ovary 5) Peritoneal biopsies - anterior cul-de-sac peritoneum, posterior cul-de-sac peritoneum, left pelvic side wall peritoneum, right pelvic side wall peritoneum 6) Right and left pelvic lymph nodes 7) Right and left para-aortic lymph nodes 8) Omentum 9) Anaerobic/Aerobic culture - preliminary read is no organisms    05/27/2018 Pathology Results   A: Ovary and fallopian tube, left, salpingo-oophorectomy - Clear cell carcinoma of left ovary, size 14.0 cm, with necrosis  - Carcinoma is adherent to fallopian tube, consistent with surface involvement (stage pT1c2) - Focal endometriosis present - See synoptic report and comment  B: Uterus with cervix and right ovary and fallopian tube, hysterectomy and right salpingo-oophorectomy Cervix: Benign with Nabothian cysts Endometrium: Endometrial polyp (size 3.5 cm); inactive endometrium with cystic glandular changes Myometrium: Leiomyomata with hyalinization and calcification (size up to 4.2 cm); focal adenomyosis  Right ovary: Benign physiologic changes Right fallopian tube: Small paratubal cyst and no malignancy identified  C: Lymph nodes, right pelvic,  regional resection - Nine lymph nodes with no metastatic carcinoma identified (0/9)  D: Lymph nodes, left pelvic, regional resection - Five lymph nodes with no metastatic carcinoma identified (0/5)  E: Cul-de-sac, anterior, biopsy - Fibroadipose tissue with no carcinoma identified  F: Cul-de-sac, posterior, biopsy - Fibrous tissue with inflammation and no carcinoma identified  G: Pelvic sidewall, right, biopsy - Fibroadipose tissue with focal crushed CD10 positive cells, cannot exclude endometrial type stroma - No carcinoma identified  H: Pelvic sidewall, left, biopsy - Fibroadipose tissue with focal crushed cells, cannot exclude endometrial type stroma - No carcinoma identified  I: Omentum, omentectomy - Adipose and fibrovascular tissue consistent with omentum - No carcinoma identified  J: Lymph nodes, left periaortic, regional resection - Two small lymph nodes with no metastatic carcinoma identified (0/2)  K: Lymph nodes, right periaortic, regional resection - Three small lymph nodes with no metastatic carcinoma identified (0/3)  This electronic signature is attestation that the pathologist personally reviewed the submitted material(s) and the final diagnosis reflects that evaluation.     Synoptic Report     OVARY or FALLOPIAN TUBE or PRIMARY PERITONEUM  (Ovary FT Perit - All Specimens)    SPECIMEN    Procedure:    Total hysterectomy and bilateral salpingo-oophorectomy     Procedure:    Omentectomy     Procedure:    Peritoneal  biopsies     Procedure:    Peritoneal washing   :        Specimen Integrity of Left Ovary:    Received largely intact with multiple areas of disruption   Tumor Site:    Left ovary   Histologic Type:    Clear cell carcinoma   Histologic Grade:    High grade   :  Tumor Size:    Greatest dimension in Centimeters (cm): 14.0 Centimeters (cm)      Additional Dimension in Centimeters (cm):    10 Centimeters (cm)      Additional Dimension in  Centimeters (cm):    7 Centimeters (cm)  Tumor Extent:        Ovarian Surface Involvement:    Present       Laterality:    Left     Other Tissue / Organ Involvement:    Not identified     Peritoneal Ascitic Fluid:    Negative for malignancy (normal / benign)     Pleural Fluid:    Not submitted / unknown   Accessory Findings:      LYMPH NODES  Regional Lymph Nodes:    All lymph nodes negative for tumor cells   Number of Lymph Nodes Examined:    19     Site(s):    Right pelvic: 9     Site(s):    Left pelvic: 5     Site(s):    Right para-aortic: 3     Site(s):    Left para-aortic: 2   PATHOLOGIC STAGE CLASSIFICATION (pTNM, AJCC 8th Edition)  Primary Tumor (pT):    pT1c2   Regional Lymph Nodes (pN):    pN0   FIGO STAGE  FIGO Stage:    IC2   ADDITIONAL FINDINGS  Additional Pathologic Findings:    Endometriosis   Additional Pathologic Findings:    Benign fallopian tube with adherence to carcinoma; also see additional parts   Comment(s)  Comment(s):    Also see diagnosis comment.      The frozen section diagnosis is confirmed for specimen A. Sections demonstrate a carcinoma with tubulocystic and papillary architecture with hyalinized cores, lined by cells with high grade nuclei, clear to eosinophilic cytoplasm, and nuclear hobnailing.  The morphologic features are most suggestive of clear cell carcinoma.  Immunohistochemical stains are performed, and demonstrate that the carcinoma is positive for CK7 and PAX8, consistent with gynecologic origin. It has patchy positivity for Napsin A, wild type p53 staining, and is negative for ER, supporting the diagnosis of clear cell carcinoma.   Sections of the tumor demonstrate carcinoma with adherence to fallopian tube surface, consistent with surface involvement.  Per the operative note, findings were also suggestive of preoperative rupture of the mass.  Given both these findings, the tumor is staged as a pT1c2 (FIGO IC2)            05/27/2018  Genetic Testing   Patient has genetic testing done for genetic disorder Results revealed patient has the following mutation(s): BRCA 1 on tissue block, neg blood   06/04/2018 Procedure   She had port placement   06/05/2018 Cancer Staging   Staging form: Ovary, Fallopian Tube, and Primary Peritoneal Carcinoma, AJCC 8th Edition - Pathologic: FIGO Stage IC2, calculated as Stage IC (pT1c2, pN0, cM0) - Signed by Artis Delay, MD on 06/05/2018   06/27/2018 Imaging   1. No evidence of residual/recurrent tumor in the pelvis. 2. No evidence of metastatic disease in the chest, abdomen or pelvis. 3. Left pelvic sidewall 6.3 x 5.0 cm simple fluid collection, compatible with postsurgical seroma or lymphocele. 4. Small hiatal hernia. 5.  Aortic Atherosclerosis (ICD10-I70.0).   06/27/2018 Tumor Marker   Patient's tumor was tested for the following markers: CA-125 Results of the tumor marker test revealed 90.3   07/01/2018 - 10/14/2018 Chemotherapy   The patient had palonosetron (ALOXI) injection 0.25 mg,  0.25 mg, Intravenous,  Once, 6 of 6 cycles  Administration: 0.25 mg (07/01/2018), 0.25 mg (07/22/2018), 0.25 mg (08/12/2018), 0.25 mg (09/02/2018), 0.25 mg (09/23/2018), 0.25 mg (10/14/2018)    CARBOplatin (PARAPLATIN) 420 mg in sodium chloride 0.9 % 250 mL chemo infusion, 420 mg (98.3 % of original dose 425.4 mg), Intravenous,  Once, 6 of 6 cycles  Dose modification:   (original dose 425.4 mg, Cycle 1), 380 mg (original dose 425.4 mg, Cycle 5, Reason: Dose not tolerated), 380 mg (original dose 425.4 mg, Cycle 6, Reason: Dose not tolerated)  Administration: 420 mg (07/01/2018), 440 mg (07/22/2018), 380 mg (08/12/2018), 440 mg (09/02/2018), 380 mg (09/23/2018), 380 mg (10/14/2018)    PACLitaxel (TAXOL) 270 mg in sodium chloride 0.9 % 250 mL chemo infusion (> 80mg /m2), 175 mg/m2 = 270 mg, Intravenous,  Once, 6 of 6 cycles  Administration: 270 mg (07/01/2018), 270 mg (07/22/2018), 270 mg (08/12/2018), 270 mg  (09/02/2018), 270 mg (09/23/2018), 270 mg (10/14/2018)    fosaprepitant (EMEND) 150 mg, dexamethasone (DECADRON) 12 mg in sodium chloride 0.9 % 145 mL IVPB, , Intravenous,  Once, 6 of 6 cycles  Administration:  (07/01/2018),  (07/22/2018),  (08/12/2018),  (09/02/2018),  (09/23/2018),  (10/14/2018)  for chemotherapy treatment.     07/15/2018 Imaging   Vascular US Findings consistent with acute deep vein thrombosis involving the right internal jugular veins. She is placed on Xarelto   07/21/2018 Tumor Marker   Patient's tumor was tested for the following markers: CA-125 Results of the tumor marker test revealed 22.8   09/22/2018 Tumor Marker   Patient's tumor was tested for the following markers: CA-125 Results of the tumor marker test revealed 14.5   11/10/2018 Imaging   Status post hysterectomy and bilateral salpingo-oophorectomy.   4.9 cm improving postoperative seroma along the left pelvic sidewall.   No evidence of recurrent or metastatic disease.     11/10/2018 Tumor Marker   Patient's tumor was tested for the following markers: CA-125 Results of the tumor marker test revealed 12.6   11/24/2018 Procedure   Successful removal of implanted Port-A-Cath.     Interval History: Doing well.  Has had some congestion and a cough, in part related to reflux.  This has been improving.  She is on pantoprazole.  Continues to have occasional low back pain, stable.  Denies any abdominal or pelvic pain.  Denies vaginal bleeding or discharge.  Reports baseline bowel bladder function.  Past Medical/Surgical History: Past Medical History:  Diagnosis Date   AKI (acute kidney injury) (HCC) 05/23/2018   Arthritis    in bilateral knees   Cancer (HCC)    left ovarian cancer   Family history of colon cancer    Family history of ovarian cancer    GERD (gastroesophageal reflux disease)    History of DVT (deep vein thrombosis) 2019   had a catheter-related DVT during chemotherapy   Hyperlipidemia     Hypertension    Steroid myopathy 09/22/2018    Past Surgical History:  Procedure Laterality Date   ABDOMINAL HYSTERECTOMY     BIOPSY THYROID  2008   COLONOSCOPY     IR CV LINE INJECTION  07/15/2018   IR REMOVAL TUN ACCESS W/ PORT W/O FL MOD SED  11/24/2018   TONSILECTOMY, ADENOIDECTOMY, BILATERAL MYRINGOTOMY AND TUBES Bilateral 1957    Family History  Problem Relation Age of Onset   Hypertension Mother    Arthritis Mother    Hypertension Father    Diabetes Father  Coronary artery disease Father    Hyperlipidemia Father    Colon cancer Father        72's   [id number] positive Daughter    Cancer Maternal Aunt        type unk   Ovarian cancer Cousin 29   Cancer Cousin        mouth    Social History   Socioeconomic History   Marital status: Married    Spouse name: Harvie Heck   Number of children: 3   Years of education: Not on file   Highest education level: Not on file  Occupational History   Occupation: retired Architect  Tobacco Use   Smoking status: Never   Smokeless tobacco: Never  Vaping Use   Vaping status: Never Used  Substance and Sexual Activity   Alcohol use: No   Drug use: No   Sexual activity: Not on file  Other Topics Concern   Not on file  Social History Narrative   Diet:Unrestricted   Do you drink/eat things with caffeine? Rarely   Marital status: Married                             What year were you married? 1992   Do you live in a house, apartment, assisted living, condo, trailer, etc)? House   Is it one or more stories? 1   How many persons live in your home? 3   Do you have any pets in your home?  2 small dogs   Current or past profession: Dispensing optician   Do you exercise?                                                     Type & how often:   Do you have a living will? No   Do you have a DNR Form? No   Do you have a POA/HPOA forms? NO   Social Determinants of Health   Financial Resource Strain: Low Risk  (09/05/2017)   Overall  Financial Resource Strain (CARDIA)    Difficulty of Paying Living Expenses: Not hard at all  Food Insecurity: No Food Insecurity (09/05/2017)   Hunger Vital Sign    Worried About Running Out of Food in the Last Year: Never true    Ran Out of Food in the Last Year: Never true  Transportation Needs: No Transportation Needs (09/05/2017)   PRAPARE - Administrator, Civil Service (Medical): No    Lack of Transportation (Non-Medical): No  Physical Activity: Inactive (09/05/2017)   Exercise Vital Sign    Days of Exercise per Week: 0 days    Minutes of Exercise per Session: 0 min  Stress: No Stress Concern Present (09/05/2017)   [employer] of Occupational Health - Occupational Stress Questionnaire    Feeling of Stress : Only a little  Social Connections: Moderately Integrated (09/05/2017)   Social Connection and Isolation Panel [NHANES]    Frequency of Communication with Friends and Family: Never    Frequency of Social Gatherings with Friends and Family: More than three times a week    Attends Religious Services: 1 to 4 times per year    Active Member of [employer] or Organizations: No    Attends Banker Meetings: Never  Marital Status: Married    Current Medications:  Current Outpatient Medications:    alendronate (FOSAMAX) 70 MG tablet, TAKE 1 TABLET EVERY WEEK WITH A FULL GLASS OF WATER ON AN EMPTY STOMACH, Disp: 12 tablet, Rfl: 3   Ascorbic Acid (VITAMIN C PO), Take 500 mg by mouth daily., Disp: , Rfl:    aspirin EC 81 MG tablet, Take 81 mg by mouth daily. Swallow whole., Disp: , Rfl:    BLACK COHOSH PO, Take 1 capsule by mouth daily., Disp: , Rfl:    Calcium-Magnesium-Vitamin D (CALCIUM 1200+D3 PO), , Disp: , Rfl:    Glucosamine HCl ([employer] GLUCOSAMINE PO), 2 tabs by mouth in the morning and 1 tab by mouth at night, Disp: , Rfl:    losartan (COZAAR) 50 MG tablet, TAKE 1 TABLET EVERY DAY, Disp: 90 tablet, Rfl: 3   metoprolol tartrate (LOPRESSOR) 50 MG tablet,  TAKE 1 TABLET TWICE DAILY, Disp: 180 tablet, Rfl: 3   Multiple Vitamin (MULTIVITAMIN) tablet, Take 1 tablet by mouth 2 (two) times daily. , Disp: , Rfl:    Omega-3 Fatty Acids (OMEGA-3 FISH OIL PO), Take 1 tablet by mouth 2 (two) times daily., Disp: , Rfl:    pantoprazole (PROTONIX) 40 MG tablet, Take 1 tablet (40 mg total) by mouth daily., Disp: 90 tablet, Rfl: 1   Probiotic Product (PROBIOTIC-10 PO), , Disp: , Rfl:    simvastatin (ZOCOR) 20 MG tablet, TAKE 1 TABLET EVERY DAY, Disp: 90 tablet, Rfl: 3  Review of Systems: + Ringing in ears, cough, wheezing, incontinence, hot flashes, joint pain, back pain, headache. Denies appetite changes, fevers, chills, fatigue, unexplained weight changes. Denies hearing loss, neck lumps or masses, mouth sores,  or voice changes. Denies shortness of breath. Denies chest pain or palpitations. Denies leg swelling. Denies abdominal distention, pain, blood in stools, constipation, diarrhea, nausea, vomiting, or early satiety. Denies pain with intercourse, dysuria, frequency, hematuria. Denies pelvic pain, vaginal bleeding or vaginal discharge.   Denies muscle pain/cramps. Denies itching, rash, or wounds. Denies dizziness, numbness or seizures. Denies swollen lymph nodes or glands, denies easy bruising or bleeding. Denies anxiety, depression, confusion, or decreased concentration.  Physical Exam: BP (!) 142/88 (BP Location: Left Arm, Patient Position: Sitting) Comment: MD notified  Pulse 72   Temp 98.6 F (37 C) (Oral)   Ht 4\' 10"  (1.473 m)   Wt 165 lb (74.8 kg)   SpO2 100%   BMI 34.49 kg/m  General: Alert, oriented, no acute distress. HEENT: Normocephalic, atraumatic, sclera anicteric. Chest: Unlabored breathing on room air. Abdomen: Obese, soft, nontender.  Normoactive bowel sounds.  No masses or hepatosplenomegaly appreciated.  Well-healed incisions. Extremities: Grossly normal range of motion.  Warm, well perfused.  No edema bilaterally. Skin:  No rashes or lesions noted. Lymphatics: No cervical, supraclavicular, or inguinal adenopathy. GU: Normal appearing external genitalia without erythema, excoriation, or lesions.  Speculum exam reveals atrophic vaginal mucosa, no lesions or masses.  Bimanual exam reveals cuff intact, no nodularity or masses.  Rectovaginal exam confirms these findings.  Laboratory & Radiologic Studies:          Component Ref Range & Units 5 mo ago (11/22/22) 1 yr ago (04/16/22) 1 yr ago (11/03/21) 2 yr ago (04/26/21) 2 yr ago (10/25/20) 2 yr ago (07/26/20) 3 yr ago (04/20/20)  Cancer Antigen (CA) 125 0.0 - 38.1 U/mL 18.2 16.0 CM 16.2 CM 16.3 CM 14.2 CM 12.6 CM 15.4 C     Assessment & Plan: Melissa Simpson is a  72 y.o. woman with Stage IC2 status post staging surgery in 05/2018 and completion of adjuvant chemotherapy in 10/2018 who presents for surveillance. Somatic BRCA1 mutation.   The patient is doing well and continues to be NED on exam.  She had her CA-125 drawn today and it will release to her tomorrow in [place].   We will continue with visits every 6 months until 5 years.  We discussed today that once she is 5 years out from treatment, we will transition to yearly visits that can be with her OB/GYN.  We discussed signs and symptoms that if she were to develop should prompt a phone call to be seen sooner.  20 minutes of total time was spent for this patient encounter, including preparation, face-to-face counseling with the patient and coordination of care, and documentation of the encounter.  Eugene Garnet, MD  Division of Gynecologic Oncology  Department of Obstetrics and Gynecology  [facility]

## 2023-05-17 ENCOUNTER — Inpatient Hospital Stay: Payer: Medicare HMO | Attending: Gynecologic Oncology

## 2023-05-17 ENCOUNTER — Inpatient Hospital Stay (HOSPITAL_BASED_OUTPATIENT_CLINIC_OR_DEPARTMENT_OTHER): Payer: Medicare HMO | Admitting: Gynecologic Oncology

## 2023-05-17 ENCOUNTER — Ambulatory Visit: Payer: Medicare HMO | Admitting: Gynecologic Oncology

## 2023-05-17 ENCOUNTER — Encounter: Payer: Self-pay | Admitting: Gynecologic Oncology

## 2023-05-17 VITALS — BP 142/88 | HR 72 | Temp 98.6°F | Ht <= 58 in | Wt 165.0 lb

## 2023-05-17 DIAGNOSIS — Z1501 Genetic susceptibility to malignant neoplasm of breast: Secondary | ICD-10-CM | POA: Insufficient documentation

## 2023-05-17 DIAGNOSIS — Z1502 Genetic susceptibility to malignant neoplasm of ovary: Secondary | ICD-10-CM | POA: Diagnosis not present

## 2023-05-17 DIAGNOSIS — C562 Malignant neoplasm of left ovary: Secondary | ICD-10-CM

## 2023-05-17 DIAGNOSIS — Z1509 Genetic susceptibility to other malignant neoplasm: Secondary | ICD-10-CM | POA: Insufficient documentation

## 2023-05-17 DIAGNOSIS — Z9221 Personal history of antineoplastic chemotherapy: Secondary | ICD-10-CM | POA: Insufficient documentation

## 2023-05-17 DIAGNOSIS — Z90722 Acquired absence of ovaries, bilateral: Secondary | ICD-10-CM | POA: Insufficient documentation

## 2023-05-17 DIAGNOSIS — Z148 Genetic carrier of other disease: Secondary | ICD-10-CM

## 2023-05-17 DIAGNOSIS — Z90710 Acquired absence of both cervix and uterus: Secondary | ICD-10-CM | POA: Diagnosis not present

## 2023-05-17 DIAGNOSIS — Z8543 Personal history of malignant neoplasm of ovary: Secondary | ICD-10-CM | POA: Diagnosis not present

## 2023-05-17 NOTE — Patient Instructions (Signed)
It was good to see you today.  I do not see or feel any evidence of cancer recurrence on your exam.  I will see you for follow-up in 6 months.  Ca-125 will be back over the weekend.  As always, if you develop any new and concerning symptoms before your next visit, please call to see me sooner.

## 2023-05-18 LAB — CA 125: Cancer Antigen (CA) 125: 19.8 U/mL (ref 0.0–38.1)

## 2023-05-21 ENCOUNTER — Telehealth: Payer: Self-pay | Admitting: *Deleted

## 2023-05-21 NOTE — Telephone Encounter (Signed)
Spoke with Melissa Simpson and relayed message from Dr. Pricilla Holm that her CA 125 results are normal. Pt states she saw the result and note in MyChart and thanked the office for calling. Pt had no questions or concerns at this time.

## 2023-05-21 NOTE — Telephone Encounter (Signed)
-----   Message from Carver Fila sent at 05/21/2023  1:22 PM EDT ----- Please let the patient know CA-125 was normal. Thank you

## 2023-06-05 ENCOUNTER — Encounter (INDEPENDENT_AMBULATORY_CARE_PROVIDER_SITE_OTHER): Payer: Self-pay | Admitting: Otolaryngology

## 2023-06-05 ENCOUNTER — Ambulatory Visit (INDEPENDENT_AMBULATORY_CARE_PROVIDER_SITE_OTHER): Payer: Medicare HMO | Admitting: Otolaryngology

## 2023-06-05 ENCOUNTER — Other Ambulatory Visit (HOSPITAL_COMMUNITY)
Admission: RE | Admit: 2023-06-05 | Discharge: 2023-06-05 | Disposition: A | Discharge status: Home / Self Care | Payer: Medicare HMO | Source: Ambulatory Visit | Attending: Otolaryngology | Admitting: Otolaryngology

## 2023-06-05 VITALS — BP 167/95 | HR 51 | Wt 162.0 lb

## 2023-06-05 DIAGNOSIS — K219 Gastro-esophageal reflux disease without esophagitis: Secondary | ICD-10-CM

## 2023-06-05 DIAGNOSIS — K148 Other diseases of tongue: Secondary | ICD-10-CM | POA: Insufficient documentation

## 2023-06-05 DIAGNOSIS — R490 Dysphonia: Secondary | ICD-10-CM | POA: Diagnosis not present

## 2023-06-05 DIAGNOSIS — R0982 Postnasal drip: Secondary | ICD-10-CM

## 2023-06-05 NOTE — Progress Notes (Signed)
Dear Dr. Janyth Contes, Here is my assessment for our mutual patient, Melissa Simpson. Thank you for allowing me the opportunity to care for your patient. Please do not hesitate to contact me should you have any other questions. Sincerely, Dr. Jovita Kussmaul  Otolaryngology Clinic Note Referring provider: Dr. Janyth Contes HPI:  Melissa Simpson is a 72 y.o. female kindly referred by Dr. Janyth Contes for evaluation of left tongue lesion. She reports that she first noticed it months ago (at least 6 months), and went to her dentist. Has not changed in size, not painful, no bleeding. Not bothersome at all, just feels it once in a while. No ear pain, odynophagia, dysphagia, SOB, bloody cough.   Has also had a dry cough and PND with some intermittent hoarseness for a couple of months but that's about it - improving. She does have some GERD symptoms and is on protonix for this with GI follow up. Some nasal congestion but no sinonasal pain, pressure, hyposmia. No unintentional weight loss. Voice is worse with use but appropriate for her use. No SOB, dysphagia, odynophagia.   Was seen by her PCP who referred her here.  She is on a bisphosphonate. Dry cough - she thinks protonix is helping  Tobacco: denies  No H&N surgery in the past.  On ASA 81  PMHx: Osteoporosis, GERD, Ovarian cancer s/p treatment, HTN, h/o DVT  PMH/Meds/All/SocHx/FamHx/ROS:   Past Medical History:  Diagnosis Date   AKI (acute kidney injury) (HCC) 05/23/2018   Arthritis    in bilateral knees   Cancer (HCC)    left ovarian cancer   Family history of colon cancer    Family history of ovarian cancer    GERD (gastroesophageal reflux disease)    History of DVT (deep vein thrombosis) 2019   had a catheter-related DVT during chemotherapy   Hyperlipidemia    Hypertension    Steroid myopathy 09/22/2018   Past Surgical History:  Procedure Laterality Date   ABDOMINAL HYSTERECTOMY     BIOPSY THYROID  2008   COLONOSCOPY     IR CV LINE INJECTION   07/15/2018   IR REMOVAL TUN ACCESS W/ PORT W/O FL MOD SED  11/24/2018   TONSILECTOMY, ADENOIDECTOMY, BILATERAL MYRINGOTOMY AND TUBES Bilateral 1957   Denies history of head or neck surgery.  Family History  Problem Relation Age of Onset   Hypertension Mother    Arthritis Mother    Hypertension Father    Diabetes Father    Coronary artery disease Father    Hyperlipidemia Father    Colon cancer Father        75's   HLA-B27 positive Daughter    Cancer Maternal Aunt        type unk   Ovarian cancer Cousin 27   Cancer Cousin        mouth   No family history of bleeding disorders or difficulty with anesthesia  Social Connections: Moderately Integrated (09/05/2017)   Social Connection and Isolation Panel [NHANES]    Frequency of Communication with Friends and Family: Never    Frequency of Social Gatherings with Friends and Family: More than three times a week    Attends Religious Services: 1 to 4 times per year    Active Member of [employer] or Organizations: No    Attends Banker Meetings: Never    Marital Status: Married    Tobacco: denies.   Current Outpatient Medications:    BLACK COHOSH PO, Take 1 capsule by mouth daily., Disp: , Rfl:  Calcium-Magnesium-Vitamin D (CALCIUM 1200+D3 PO), , Disp: , Rfl:    Glucosamine HCl ([employer] GLUCOSAMINE PO), 2 tabs by mouth in the morning and 1 tab by mouth at night, Disp: , Rfl:    losartan (COZAAR) 50 MG tablet, TAKE 1 TABLET EVERY DAY, Disp: 90 tablet, Rfl: 3   metoprolol tartrate (LOPRESSOR) 50 MG tablet, TAKE 1 TABLET TWICE DAILY, Disp: 180 tablet, Rfl: 3   Multiple Vitamin (MULTIVITAMIN) tablet, Take 1 tablet by mouth 2 (two) times daily. , Disp: , Rfl:    Omega-3 Fatty Acids (OMEGA-3 FISH OIL PO), Take 1 tablet by mouth 2 (two) times daily., Disp: , Rfl:    pantoprazole (PROTONIX) 40 MG tablet, Take 1 tablet (40 mg total) by mouth daily., Disp: 90 tablet, Rfl: 1   Probiotic Product (PROBIOTIC-10 PO), , Disp: , Rfl:     simvastatin (ZOCOR) 20 MG tablet, TAKE 1 TABLET EVERY DAY, Disp: 90 tablet, Rfl: 3   alendronate (FOSAMAX) 70 MG tablet, TAKE 1 TABLET EVERY WEEK WITH A FULL GLASS OF WATER ON AN EMPTY STOMACH, Disp: 12 tablet, Rfl: 3   Ascorbic Acid (VITAMIN C PO), Take 500 mg by mouth daily., Disp: , Rfl:    aspirin EC 81 MG tablet, Take 81 mg by mouth daily. Swallow whole., Disp: , Rfl:   A 20-point ROS was performed with pertinent positives/negatives noted in the HPI   Physical Exam:   BP (!) 167/95 (BP Location: Right Arm, Patient Position: Sitting, Cuff Size: Normal) Comment: takes Bp mediction took 30 or 20 minutes ago  Pulse (!) 51   Wt 162 lb (73.5 kg)   SpO2 96%   BMI 33.86 kg/m    Salient findings:  CN II-XII intact Bilateral EAC clear and TM intact with well pneumatized middle ear spaces Anterior rhinoscopy: Septum deviates modestly right; bilateral inferior turbinates with mild hypertrophy No lesions of oral cavity/oropharynx; there is a 5mm lesion on lateral tongue (soft) - appears to be papilloma; no bleeding or tenderness; no other oral cavity lesions noted No obviously palpable neck masses/lymphadenopathy/thyromegaly No respiratory distress or stridor    Independent Review of Additional Tests or Records:  PCP notes reviewed and recent CA 125 and CBC independently reviewed - Hgb 16.1; Plt 295  Procedures:  Procedure Note Pre-procedure diagnosis:  Dysphonia, post nasal drip Post-procedure diagnosis: Same Procedure: Transnasal Fiberoptic Laryngoscopy, CPT [id number] Indication: Dysphonia, post nasal drip and cough Complications: None apparent EBL: 0 mL Date: 06/05/23   The procedure was undertaken to further evaluate the patient's complaint of dysphonia, post nasal drip and cough, with mirror exam inadequate for appropriate examination due to gag reflex and poor patient tolerance  Procedure:  Patient was identified as correct patient. Verbal consent was obtained. The nose was sprayed  with oxymetazoline and 4% lidocaine. The The flexible laryngoscope was passed through the nose to view the nasal cavity, pharynx (oropharynx, hypopharynx) and larynx.  The larynx was examined at rest and during multiple phonatory tasks. Documentation was obtained and reviewed with patient. The scope was removed. The patient tolerated the procedure well.  Findings: The nasal cavity and nasopharynx did not reveal any masses or lesions, mucosa appeared to be without obvious lesions. The tongue base, pharyngeal walls, piriform sinuses, vallecula, epiglottis and postcricoid region are normal in appearance without significant obvious lesions or significant amount of secretions. The visualized portion of the subglottis and proximal trachea is widely patent. The vocal folds are mobile bilaterally. There are no lesions on the free edge of  the vocal folds nor elsewhere in the larynx worrisome for malignancy.  Electronically signed by: Read Drivers, MD 06/05/2023 5:12 PM  Procedure Note - Biopsy of Tongue  Name: Melissa Simpson MRN: [id number] Date: 06/05/23   Pre-procedure diagnosis: Oral Tongue Lesion, concern for malignancy Post-procedure diagnosis: Same Procedure: Biopsy of Left oral tongue Lesion  Tongue: CPT 41100 Complications: None apparent EBL: <5 mL Date: 06/05/23  Indication: Left oral tongue Lesion, concern for malignancy  Risks and benefits of biopsy were explained to the patient, written consent was obtained.   The patient was identified as the correct patient.  The area surrounding the lesion was injected 1% Lidocaine with 1:100,000 epinephrine and allowed to work.. Since the area was exophytic, a curved scissor and pickups were used to excise the lesion, in effect a large biopsy. It was placed in formalin and passed off the field. There was minimal bleeding that fully resolved prior to the end of the procedure with the following interventions: pressure and focal silver nitrate cautery. The  patient tolerated the procedure well.    Electronically signed by: Read Drivers, MD 06/05/2023 5:20 PM   Impression & Plans:  Melissa Simpson is a 72 y.o. female referred by Dr. Janyth Contes for evaluation of left tongue lesion which has been stable over past 6 months but bothersome for patient as she sometimes bites it. Otherwise no concerning symptoms. She also has a dry cough and PND with intermittent hoarseness, history consistent with laryngeal hypersensitivity and LPR as well as muscle tension dysphonia. Voice is appropriate for her use. A left lateral tongue lesion was biopsied today to ensure no malignancy, and TFL was largely unremarkable without other significant lesions.   Left tongue lesion - Biopsied; will contact with results - Ok for soft diet for first few days, then regular diet - In case of signficant bleeding, hold pressure and cold water gargles; contact us if other issues - Tylenol 1000mg  q6h as needed for pain  2. Post nasal drip, laryngeal hypersensitivity, likely LPR, dysphonia - Continue PPI - We discussed sipping water during coughing attacks and other future options and treatment for MTD - She wished to defer other further treatment such as voice therapy  - Will contact her with results of biopsy; if benign, she will monitor the area; suspect papilloma and I think given exophytic nature, most likely excised in entirety. If benign, she will monitor and will follow up as needed  I have personally spent 50 minutes involved in face-to-face and non-face-to-face activities for this patient on the day of the visit.  Professional time spent includes the following activities, in addition to those noted in the documentation: preparing to see the patient (review of outside documentation), performing a medically appropriate examination and/or evaluation, counseling and educating the patient/family/caregiver, ordering medications, performing procedures (biopsy, TFL), referring and  communicating with other healthcare professionals, documenting clinical information in the electronic or other health record, independently interpreting results and communicating results with the patient/family/caregiver  Thank you for allowing me the opportunity to care for your patient. Please do not hesitate to contact me should you have any other questions.  Sincerely, Jovita Kussmaul, MD Otolarynoglogist (ENT), [facility] Phone: [phone] Fax: [phone]  06/05/2023, 5:07 PM

## 2023-06-07 LAB — SURGICAL PATHOLOGY

## 2023-06-12 ENCOUNTER — Other Ambulatory Visit: Payer: Self-pay | Admitting: Nurse Practitioner

## 2023-06-12 DIAGNOSIS — I10 Essential (primary) hypertension: Secondary | ICD-10-CM

## 2023-06-13 ENCOUNTER — Telehealth (INDEPENDENT_AMBULATORY_CARE_PROVIDER_SITE_OTHER): Payer: Self-pay | Admitting: Otolaryngology

## 2023-06-13 ENCOUNTER — Telehealth: Payer: Self-pay

## 2023-06-13 NOTE — Telephone Encounter (Signed)
Tried to call but did not respond

## 2023-06-13 NOTE — Telephone Encounter (Signed)
Called patient re: path results. Benign; LVM  Read Drivers

## 2023-06-13 NOTE — Telephone Encounter (Signed)
Pt left voicemail returning Dr. Eliane Decree call regarding biopsy results.   Call back # [phone].

## 2023-06-17 ENCOUNTER — Ambulatory Visit
Admission: RE | Admit: 2023-06-17 | Discharge: 2023-06-17 | Disposition: A | Discharge status: Home / Self Care | Payer: Medicare HMO | Source: Ambulatory Visit | Attending: Adult Health | Admitting: Adult Health

## 2023-06-17 ENCOUNTER — Ambulatory Visit (INDEPENDENT_AMBULATORY_CARE_PROVIDER_SITE_OTHER): Payer: Medicare HMO | Admitting: Adult Health

## 2023-06-17 ENCOUNTER — Encounter: Payer: Self-pay | Admitting: Adult Health

## 2023-06-17 VITALS — BP 128/77 | HR 54 | Temp 97.1°F | Resp 18 | Ht <= 58 in | Wt 168.2 lb

## 2023-06-17 DIAGNOSIS — I10 Essential (primary) hypertension: Secondary | ICD-10-CM

## 2023-06-17 DIAGNOSIS — R053 Chronic cough: Secondary | ICD-10-CM

## 2023-06-17 DIAGNOSIS — K219 Gastro-esophageal reflux disease without esophagitis: Secondary | ICD-10-CM | POA: Diagnosis not present

## 2023-06-17 MED ORDER — GUAIFENESIN ER 600 MG PO TB12: 600.0000 mg | ORAL_TABLET | Freq: Every day | ORAL | 0 refills | Status: AC

## 2023-06-17 MED ORDER — ALBUTEROL SULFATE HFA 108 (90 BASE) MCG/ACT IN AERS: 2.0000 | INHALATION_SPRAY | Freq: Four times a day (QID) | RESPIRATORY_TRACT | 0 refills | Status: DC | PRN

## 2023-06-17 NOTE — Progress Notes (Unsigned)
[facility] clinic  Provider: Kenard Gower DNP  Code Status:  Full Code  Goals of Care:     04/08/2023    9:22 AM  Advanced Directives  Does Patient Have a Medical Advance Directive? Yes  Type of Estate agent of [place];Living will  Does patient want to make changes to medical advance directive? No - Patient declined  Copy of Healthcare Power of Attorney in Chart? Yes - validated most recent copy scanned in chart (See row information)     Chief Complaint  Patient presents with   Acute Visit     persistent cough     HPI: Patient is a 72 y.o. female seen today for an acute visit for persistent cough. She has been coughing for 2 months now. She has clear nasal discharge and coughing is worse at night. When she leans forward, she coughs. When she lay still, cough gets better. She has yellowish productive cough. She denies having fever, chills nor sore throat.  COVID-19 test was negative.  She takes Protonix for GERD.  BP 128/77, takes Losartan and Metoprolol tartrate for hypertension   Past Medical History:  Diagnosis Date   AKI (acute kidney injury) (HCC) 05/23/2018   Arthritis    in bilateral knees   Cancer (HCC)    left ovarian cancer   Family history of colon cancer    Family history of ovarian cancer    GERD (gastroesophageal reflux disease)    History of DVT (deep vein thrombosis) 2019   had a catheter-related DVT during chemotherapy   Hyperlipidemia    Hypertension    Steroid myopathy 09/22/2018    Past Surgical History:  Procedure Laterality Date   ABDOMINAL HYSTERECTOMY     BIOPSY THYROID  2008   COLONOSCOPY     IR CV LINE INJECTION  07/15/2018   IR REMOVAL TUN ACCESS W/ PORT W/O FL MOD SED  11/24/2018   TONSILECTOMY, ADENOIDECTOMY, BILATERAL MYRINGOTOMY AND TUBES Bilateral 1957    Allergies  Allergen Reactions   Bee Pollen Other (See Comments)   Pollen Extract     Outpatient Encounter Medications as of 06/17/2023   Medication Sig   alendronate (FOSAMAX) 70 MG tablet TAKE 1 TABLET EVERY WEEK WITH A FULL GLASS OF WATER ON AN EMPTY STOMACH   Ascorbic Acid (VITAMIN C PO) Take 500 mg by mouth daily.   aspirin EC 81 MG tablet Take 81 mg by mouth daily. Swallow whole.   BLACK COHOSH PO Take 1 capsule by mouth daily.   Calcium-Magnesium-Vitamin D (CALCIUM 1200+D3 PO)    Glucosamine HCl ([employer]S GLUCOSAMINE PO) 2 tabs by mouth in the morning and 1 tab by mouth at night   losartan (COZAAR) 50 MG tablet TAKE 1 TABLET EVERY DAY   metoprolol tartrate (LOPRESSOR) 50 MG tablet TAKE 1 TABLET TWICE DAILY   Multiple Vitamin (MULTIVITAMIN) tablet Take 1 tablet by mouth 2 (two) times daily.    Omega-3 Fatty Acids (OMEGA-3 FISH OIL PO) Take 1 tablet by mouth 2 (two) times daily.   pantoprazole (PROTONIX) 40 MG tablet Take 1 tablet (40 mg total) by mouth daily.   Probiotic Product (PROBIOTIC-10 PO)    simvastatin (ZOCOR) 20 MG tablet TAKE 1 TABLET EVERY DAY   No facility-administered encounter medications on file as of 06/17/2023.    Review of Systems:  Review of Systems  Constitutional:  Negative for appetite change, chills, fatigue and fever.  HENT:  Negative for congestion, hearing loss, rhinorrhea and sore throat.  Eyes: Negative.   Respiratory:  Positive for cough. Negative for shortness of breath and wheezing.   Cardiovascular:  Negative for chest pain, palpitations and leg swelling.  Gastrointestinal:  Negative for abdominal pain, constipation, diarrhea, nausea and vomiting.  Genitourinary:  Negative for dysuria.  Musculoskeletal:  Negative for arthralgias, back pain and myalgias.  Skin:  Negative for color change, rash and wound.  Neurological:  Negative for dizziness, weakness and headaches.  Psychiatric/Behavioral:  Negative for behavioral problems. The patient is not nervous/anxious.     Health Maintenance  Topic Date Due   COVID-19 Vaccine (5 - 2023-24 season) 05/12/2023   Medicare Annual Wellness (AWV)   09/25/2023   MAMMOGRAM  01/03/2024   Colonoscopy  11/06/2026   DTaP/Tdap/Td (3 - Td or Tdap) 11/06/2032   Pneumonia Vaccine 12+ Years old  Completed   INFLUENZA VACCINE  Completed   DEXA SCAN  Completed   Hepatitis C Screening  Completed   Zoster Vaccines- Shingrix  Completed   HPV VACCINES  Aged Out    Physical Exam: Vitals:   06/17/23 1126  BP: 128/77  Pulse: (!) 54  Resp: 18  Temp: (!) 97.1 F (36.2 C)  SpO2: 96%  Weight: 168 lb 3.2 oz (76.3 kg)  Height: 4\' 10"  (1.473 m)   Body mass index is 35.15 kg/m. Physical Exam Constitutional:      General: She is not in acute distress.    Appearance: She is obese.  HENT:     Head: Normocephalic and atraumatic.     Nose: Nose normal.     Mouth/Throat:     Mouth: Mucous membranes are moist.  Eyes:     Conjunctiva/sclera: Conjunctivae normal.  Cardiovascular:     Rate and Rhythm: Normal rate and regular rhythm.  Pulmonary:     Effort: Pulmonary effort is normal.     Breath sounds: Normal breath sounds.  Abdominal:     General: Bowel sounds are normal.     Palpations: Abdomen is soft.  Musculoskeletal:        General: Normal range of motion.     Cervical back: Normal range of motion.  Skin:    General: Skin is warm and dry.  Neurological:     General: No focal deficit present.     Mental Status: She is alert and oriented to person, place, and time.  Psychiatric:        Mood and Affect: Mood normal.        Behavior: Behavior normal.        Thought Content: Thought content normal.        Judgment: Judgment normal.     Labs reviewed: Basic Metabolic Panel: Recent Labs    09/24/22 1037 04/08/23 1031  NA 139 138  K 4.4 4.4  CL 105 100  CO2 27 28  GLUCOSE 94 98  BUN 18 24  CREATININE 1.13* 1.10*  CALCIUM 9.8 10.2   Liver Function Tests: Recent Labs    09/24/22 1037 04/08/23 1031  AST 21 17  ALT 22 20  BILITOT 0.9 1.1  PROT 7.2 7.2   No results for input(s): "LIPASE", "AMYLASE" in the last 8760  hours. No results for input(s): "AMMONIA" in the last 8760 hours. CBC: Recent Labs    09/24/22 1037 04/08/23 1031  WBC 5.4 7.9  NEUTROABS 3,197 5,017  HGB 15.2 16.1*  HCT 44.6 46.5*  MCV 100.0 97.3  PLT 286 295   Lipid Panel: Recent Labs    09/24/22 1037  04/08/23 1031  CHOL 148 180  HDL 52 55  LDLCALC 70 94  TRIG 188* 215*  CHOLHDL 2.8 3.3   Lab Results  Component Value Date   HGBA1C 6.2 (H) 04/08/2023    Procedures since last visit: No results found.  Assessment/Plan  1. Chronic cough -  cough X 2 months -  continue Mucinex and Albuterol HFA PRN - DG Chest 2 View; Future - guaiFENesin (MUCINEX) 600 MG 12 hr tablet; Take 1 tablet (600 mg total) by mouth daily for 14 days.  Dispense: 14 tablet; Refill: 0 - albuterol (VENTOLIN HFA) 108 (90 Base) MCG/ACT inhaler; Inhale 2 puffs into the lungs every 6 (six) hours as needed for wheezing.  Dispense: 8 g; Refill: 0  2. Gastroesophageal reflux disease, unspecified whether esophagitis present -  continue Protonix  3. Essential hypertension, benign -  BP stable -  continue Losartan and Metoprolol tartrate   Labs/tests ordered:   Chest x-ray  Next appt:  09/30/2023

## 2023-06-26 ENCOUNTER — Telehealth (INDEPENDENT_AMBULATORY_CARE_PROVIDER_SITE_OTHER): Payer: Self-pay | Admitting: Otolaryngology

## 2023-06-26 NOTE — Telephone Encounter (Signed)
Called patient re: path results; benign; advised to call back if any questions

## 2023-06-27 ENCOUNTER — Telehealth: Payer: Self-pay | Admitting: *Deleted

## 2023-06-27 NOTE — Telephone Encounter (Signed)
Per provider moved appt from 3/6 to 3/27, patient aware

## 2023-07-09 ENCOUNTER — Other Ambulatory Visit: Payer: Self-pay | Admitting: Adult Health

## 2023-07-09 DIAGNOSIS — R053 Chronic cough: Secondary | ICD-10-CM

## 2023-07-12 NOTE — Progress Notes (Signed)
-   chest x-ray is negative for cardiopulmonary disease.

## 2023-08-25 ENCOUNTER — Other Ambulatory Visit: Payer: Self-pay | Admitting: Nurse Practitioner

## 2023-08-25 DIAGNOSIS — K219 Gastro-esophageal reflux disease without esophagitis: Secondary | ICD-10-CM

## 2023-09-20 ENCOUNTER — Telehealth: Payer: Self-pay

## 2023-09-20 NOTE — Telephone Encounter (Signed)
 VOB submitted for Monovisc, left knee

## 2023-09-30 ENCOUNTER — Ambulatory Visit (INDEPENDENT_AMBULATORY_CARE_PROVIDER_SITE_OTHER): Payer: Medicare HMO | Admitting: Nurse Practitioner

## 2023-09-30 ENCOUNTER — Ambulatory Visit: Payer: Medicare HMO | Admitting: Orthopedic Surgery

## 2023-09-30 VITALS — BP 132/80 | HR 52 | Temp 97.9°F | Ht <= 58 in | Wt 166.0 lb

## 2023-09-30 DIAGNOSIS — Z0000 Encounter for general adult medical examination without abnormal findings: Secondary | ICD-10-CM

## 2023-09-30 DIAGNOSIS — E2839 Other primary ovarian failure: Secondary | ICD-10-CM | POA: Diagnosis not present

## 2023-09-30 NOTE — Progress Notes (Signed)
Subjective:   Melissa MCCLAINE is a 73 y.o. female who presents for Medicare Annual (Subsequent) preventive examination.  Visit Complete: In person [facility]   Cardiac Risk Factors include: sedentary lifestyle;advanced age (>47men, >56 women);dyslipidemia;obesity (BMI >30kg/m2)     Objective:    Today's Vitals   09/27/23 1701  BP: 132/80  Pulse: (!) 52  Temp: 97.9 F (36.6 C)  TempSrc: Temporal  SpO2: 95%  Weight: 166 lb (75.3 kg)  Height: 4\' 10"  (1.473 m)   Body mass index is 34.69 kg/m.     09/30/2023    8:58 AM 04/08/2023    9:22 AM 10/08/2022    8:26 AM 09/24/2022   10:09 AM 09/20/2022    4:29 PM 11/06/2021    9:15 AM 10/02/2021    9:42 AM  Advanced Directives  Does Patient Have a Medical Advance Directive? Yes Yes Yes Yes Yes Yes Yes  Type of Estate agent of [place];Living will Healthcare Power of [place];Living will Healthcare Power of [place];Living will Healthcare Power of [place];Living will Healthcare Power of [place];Living will Healthcare Power of [place];Living will Healthcare Power of [place];Living will  Does patient want to make changes to medical advance directive? No - Patient declined No - Patient declined No - Patient declined No - Patient declined No - Patient declined No - Patient declined No - Patient declined  Copy of Healthcare Power of Attorney in Chart? Yes - validated most recent copy scanned in chart (See row information) Yes - validated most recent copy scanned in chart (See row information) Yes - validated most recent copy scanned in chart (See row information) Yes - validated most recent copy scanned in chart (See row information) Yes - validated most recent copy scanned in chart (See row information) Yes - validated most recent copy scanned in chart (See row information) Yes - validated most recent copy scanned in chart (See row information)    Current Medications (verified) Outpatient Encounter Medications as of 09/30/2023   Medication Sig   alendronate (FOSAMAX) 70 MG tablet TAKE 1 TABLET EVERY WEEK WITH A FULL GLASS OF WATER ON AN EMPTY STOMACH   Ascorbic Acid (VITAMIN C PO) Take 500 mg by mouth daily.   aspirin EC 81 MG tablet Take 81 mg by mouth daily. Swallow whole.   BLACK COHOSH PO Take 1 capsule by mouth daily.   Calcium-Magnesium-Vitamin D (CALCIUM 1200+D3 PO) Take 1 tablet by mouth 2 (two) times daily.   Glucosamine HCl ([employer] GLUCOSAMINE PO) 2 tabs by mouth in the morning and 1 tab by mouth at night   losartan (COZAAR) 50 MG tablet TAKE 1 TABLET EVERY DAY   metoprolol tartrate (LOPRESSOR) 50 MG tablet TAKE 1 TABLET TWICE DAILY   Multiple Vitamin (MULTIVITAMIN) tablet Take 1 tablet by mouth 2 (two) times daily.    Omega-3 Fatty Acids (OMEGA-3 FISH OIL PO) Take 1 tablet by mouth 2 (two) times daily.   pantoprazole (PROTONIX) 40 MG tablet TAKE 1 TABLET EVERY DAY   Probiotic Product (PROBIOTIC-10 PO) Take 1 tablet by mouth daily.   simvastatin (ZOCOR) 20 MG tablet TAKE 1 TABLET EVERY DAY   [DISCONTINUED] albuterol (VENTOLIN HFA) 108 (90 Base) MCG/ACT inhaler INHALE 2 PUFFS INTO THE LUNGS EVERY 6 HOURS AS NEEDED FOR WHEEZING   No facility-administered encounter medications on file as of 09/30/2023.    Allergies (verified) Bee pollen and Pollen extract   History: Past Medical History:  Diagnosis Date   AKI (acute kidney injury) (HCC) 05/23/2018  Arthritis    in bilateral knees   Cancer (HCC)    left ovarian cancer   Family history of colon cancer    Family history of ovarian cancer    GERD (gastroesophageal reflux disease)    History of DVT (deep vein thrombosis) 2019   had a catheter-related DVT during chemotherapy   Hyperlipidemia    Hypertension    Steroid myopathy 09/22/2018   Past Surgical History:  Procedure Laterality Date   ABDOMINAL HYSTERECTOMY     BIOPSY THYROID  2008   COLONOSCOPY     IR CV LINE INJECTION  07/15/2018   IR REMOVAL TUN ACCESS W/ PORT W/O FL MOD SED  11/24/2018    TONSILECTOMY, ADENOIDECTOMY, BILATERAL MYRINGOTOMY AND TUBES Bilateral 1957   Family History  Problem Relation Age of Onset   Hypertension Mother    Arthritis Mother    Hypertension Father    Diabetes Father    Coronary artery disease Father    Hyperlipidemia Father    Colon cancer Father        34's   HLA-[id number] positive Daughter    Cancer Maternal Aunt        type unk   Ovarian cancer Cousin 52   Cancer Cousin        mouth   Social History   Socioeconomic History   Marital status: Married    Spouse name: Harvie Heck   Number of children: 3   Years of education: Not on file   Highest education level: Not on file  Occupational History   Occupation: retired Architect  Tobacco Use   Smoking status: Never   Smokeless tobacco: Never  Vaping Use   Vaping status: Never Used  Substance and Sexual Activity   Alcohol use: No   Drug use: No   Sexual activity: Not on file  Other Topics Concern   Not on file  Social History Narrative   Diet:Unrestricted   Do you drink/eat things with caffeine? Rarely   Marital status: Married                             What year were you married? 1992   Do you live in a house, apartment, assisted living, condo, trailer, etc)? House   Is it one or more stories? 1   How many persons live in your home? 3   Do you have any pets in your home?  2 small dogs   Current or past profession: Dispensing optician   Do you exercise?                                                     Type & how often:   Do you have a living will? No   Do you have a DNR Form? No   Do you have a POA/HPOA forms? NO   Social Drivers of Corporate investment banker Strain: Low Risk  (09/05/2017)   Overall Financial Resource Strain (CARDIA)    Difficulty of Paying Living Expenses: Not hard at all  Food Insecurity: No Food Insecurity (09/05/2017)   Hunger Vital Sign    Worried About Running Out of Food in the Last Year: Never true    Ran Out of Food in the Last Year: Never  true  Transportation Needs: No Transportation Needs (09/05/2017)   PRAPARE - Administrator, Civil Service (Medical): No    Lack of Transportation (Non-Medical): No  Physical Activity: Inactive (09/05/2017)   Exercise Vital Sign    Days of Exercise per Week: 0 days    Minutes of Exercise per Session: 0 min  Stress: No Stress Concern Present (09/05/2017)   [employer] of Occupational Health - Occupational Stress Questionnaire    Feeling of Stress : Only a little  Social Connections: Moderately Integrated (09/05/2017)   Social Connection and Isolation Panel [NHANES]    Frequency of Communication with Friends and Family: Never    Frequency of Social Gatherings with Friends and Family: More than three times a week    Attends Religious Services: 1 to 4 times per year    Active Member of [employer] or Organizations: No    Attends Engineer, structural: Never    Marital Status: Married    Tobacco Counseling Counseling given: Not Answered   Clinical Intake:  Pre-visit preparation completed: Yes  Pain : No/denies pain     BMI - recorded: 34 Nutritional Status: BMI > 30  Obese Diabetes: No  How often do you need to have someone help you when you read instructions, pamphlets, or other written materials from your doctor or pharmacy?: 1 - Never         Activities of Daily Living    09/30/2023   10:08 AM  In your present state of health, do you have any difficulty performing the following activities:  Hearing? 0  Vision? 0  Difficulty concentrating or making decisions? 0  Walking or climbing stairs? 1  Dressing or bathing? 0  Doing errands, shopping? 0  Preparing Food and eating ? N  Using the Toilet? N  In the past six months, have you accidently leaked urine? Y  Do you have problems with loss of bowel control? N  Managing your Medications? N  Managing your Finances? N  Housekeeping or managing your Housekeeping? N    Patient Care Team: Sharon Seller, NP as PCP - General (Geriatric Medicine)  Indicate any recent Medical Services you may have received from other than [facility] providers in the past year (date may be approximate).     Assessment:   This is a routine wellness examination for Thi.  Hearing/Vision screen Vision Screening   Right eye Left eye Both eyes  Without correction     With correction 20/25 20/40 2025  Comments: Last eye exam greater than 12 months ago with [facility]   Hearing Screening - Comments:: No hearing issues, had hearing tested in the past.    Goals Addressed   None    Depression Screen    09/30/2023    9:53 AM 06/17/2023    2:40 PM 04/08/2023    9:58 AM 09/24/2022   10:09 AM 10/02/2021   10:01 AM 09/21/2021   11:16 AM 09/22/2020   10:10 AM  PHQ 2/9 Scores  PHQ - 2 Score 0 0 0 0 0 0 0  PHQ- 9 Score  0         Fall Risk    09/30/2023    9:53 AM 06/17/2023    2:39 PM 04/08/2023    9:58 AM 10/08/2022   10:37 AM 09/24/2022   10:09 AM  Fall Risk   Falls in the past year? 0 0 0 0 0  Number falls in past yr: 0 0 0 0  0  Injury with Fall? 0 0 0 0 0  Risk for fall due to : No Fall Risks No Fall Risks No Fall Risks No Fall Risks   Follow up Falls evaluation completed Falls evaluation completed Falls evaluation completed Falls evaluation completed     MEDICARE RISK AT HOME: Medicare Risk at Home Any stairs in or around the home?: No If so, are there any without handrails?: No Home free of loose throw rugs in walkways, pet beds, electrical cords, etc?: Yes Adequate lighting in your home to reduce risk of falls?: Yes Life alert?: No Use of a cane, walker or w/c?: No Grab bars in the bathroom?: Yes Shower chair or bench in shower?: Yes Elevated toilet seat or a handicapped toilet?: Yes  TIMED UP AND GO:  Was the test performed?  No    Cognitive Function:    09/30/2023   10:03 AM 09/24/2022   10:15 AM 09/15/2018   11:47 AM 09/05/2017    9:40 AM 08/30/2016    2:12 PM  MMSE - Mini  Mental State Exam  Orientation to time 5 5 5 5 5   Orientation to Place 5 5 5 5 5   Registration 3 3 3 3 3   Attention/ Calculation 5 5 5 5 5   Recall 3 3 3 2 3   Language- name 2 objects 2 2 2 2 2   Language- repeat 1 1 1 1 1   Language- follow 3 step command 3 3 3 3 3   Language- read & follow direction 1 1 1 1 1   Write a sentence 1 1 1 1 1   Copy design 1 1 1 1 1   Total score 30 30 30 29 30         09/21/2021   11:19 AM 09/20/2020   10:41 AM  6CIT Screen  What Year? 0 points 0 points  What month? 0 points 0 points  What time? 0 points 0 points  Count back from 20 0 points 0 points  Months in reverse 0 points 0 points  Repeat phrase 0 points 0 points  Total Score 0 points 0 points    Immunizations Immunization History  Administered Date(s) Administered   Influenza, High Dose Seasonal PF 06/10/2017, 06/09/2018, 06/03/2019, 07/11/2020, 06/13/2023   Influenza-Unspecified 06/11/2014, 05/27/2015, 06/27/2016, 05/18/2021, 06/29/2022   PFIZER(Purple Top)SARS-COV-2 Vaccination 04/12/2020, 05/03/2020, 10/27/2020, 08/09/2022   Pneumococcal Conjugate-13 07/06/2014   Pneumococcal Polysaccharide-23 08/29/2015   RSV,unspecified 08/09/2022   Tdap 06/23/2012, 11/06/2022   Zoster Recombinant(Shingrix) 09/18/2018, 12/22/2018    TDAP status: Up to date  Flu Vaccine status: Up to date  Pneumococcal vaccine status: Up to date  Covid-19 vaccine status: Information provided on how to obtain vaccines.   Qualifies for Shingles Vaccine? Yes   Zostavax completed No   Shingrix Completed?: Yes  Screening Tests Health Maintenance  Topic Date Due   COVID-19 Vaccine (5 - 2024-25 season) 09/29/2024 (Originally 05/12/2023)   Medicare Annual Wellness (AWV)  09/29/2024   Colonoscopy  11/06/2026   DTaP/Tdap/Td (3 - Td or Tdap) 11/06/2032   Pneumonia Vaccine 44+ Years old  Completed   INFLUENZA VACCINE  Completed   DEXA SCAN  Completed   Hepatitis C Screening  Completed   Zoster Vaccines- Shingrix   Completed   HPV VACCINES  Aged Out    Health Maintenance  There are no preventive care reminders to display for this patient.   Colorectal cancer screening: No longer required.   Mammogram status: No longer required due to age.  Bone Density status:  Completed 02/09/2022. Results reflect: Bone density results: OSTEOPOROSIS. Repeat every 2 years.  Lung Cancer Screening: (Low Dose CT Chest recommended if Age 98-80 years, 20 pack-year currently smoking OR have quit w/in 15years.) does not qualify.   Lung Cancer Screening Referral: na  Additional Screening:  Hepatitis C Screening: does qualify; Completed   Vision Screening: Recommended annual ophthalmology exams for early detection of glaucoma and other disorders of the eye. Is the patient up to date with their annual eye exam?  Yes  Who is the provider or what is the name of the office in which the patient attends annual eye exams? [facility] If pt is not established with a provider, would they like to be referred to a provider to establish care? No .   Dental Screening: Recommended annual dental exams for proper oral hygiene   Community Resource Referral / Chronic Care Management: CRR required this visit?  No   CCM required this visit?  No     Plan:     I have personally reviewed and noted the following in the patient's chart:   Medical and social history Use of alcohol, tobacco or illicit drugs  Current medications and supplements including opioid prescriptions. Patient is not currently taking opioid prescriptions. Functional ability and status Nutritional status Physical activity Advanced directives List of other physicians Hospitalizations, surgeries, and ER visits in previous 12 months Vitals Screenings to include cognitive, depression, and falls Referrals and appointments  In addition, I have reviewed and discussed with patient certain preventive protocols, quality metrics, and best practice recommendations.  A written personalized care plan for preventive services as well as general preventive health recommendations were provided to patient.     Sharon Seller, NP   09/30/2023

## 2023-09-30 NOTE — Patient Instructions (Addendum)
  Ms. Thomure , Thank you for taking time to come for your Medicare Wellness Visit. I appreciate your ongoing commitment to your health goals. Please review the following plan we discussed and let me know if I can assist you in the future.   Bone density will be due in June December 31, 2021 is when you had your last mammogram     This is a list of the screening recommended for you and due dates:  Health Maintenance  Topic Date Due   COVID-19 Vaccine (5 - 2024-25 season) 05/12/2023   Medicare Annual Wellness Visit  09/29/2024   Colon Cancer Screening  11/06/2026   DTaP/Tdap/Td vaccine (3 - Td or Tdap) 11/06/2032   Pneumonia Vaccine  Completed   Flu Shot  Completed   DEXA scan (bone density measurement)  Completed   Hepatitis C Screening  Completed   Zoster (Shingles) Vaccine  Completed   HPV Vaccine  Aged Out

## 2023-10-02 ENCOUNTER — Other Ambulatory Visit: Payer: Self-pay | Admitting: Nurse Practitioner

## 2023-10-02 DIAGNOSIS — I10 Essential (primary) hypertension: Secondary | ICD-10-CM

## 2023-10-09 ENCOUNTER — Encounter: Payer: Self-pay | Admitting: Orthopedic Surgery

## 2023-10-09 ENCOUNTER — Other Ambulatory Visit: Payer: Self-pay

## 2023-10-09 ENCOUNTER — Ambulatory Visit (INDEPENDENT_AMBULATORY_CARE_PROVIDER_SITE_OTHER): Payer: Medicare HMO | Admitting: Orthopedic Surgery

## 2023-10-09 ENCOUNTER — Ambulatory Visit: Payer: Medicare HMO | Admitting: Orthopedic Surgery

## 2023-10-09 ENCOUNTER — Telehealth: Payer: Self-pay

## 2023-10-09 DIAGNOSIS — M1712 Unilateral primary osteoarthritis, left knee: Secondary | ICD-10-CM

## 2023-10-09 DIAGNOSIS — M25551 Pain in right hip: Secondary | ICD-10-CM | POA: Diagnosis not present

## 2023-10-09 MED ORDER — CELECOXIB 100 MG PO CAPS: ORAL_CAPSULE | ORAL | 0 refills | Status: DC

## 2023-10-09 NOTE — Telephone Encounter (Signed)
Auth needed for left knee gel in 6 months

## 2023-10-09 NOTE — Progress Notes (Signed)
Office Visit Note   Patient: Melissa Simpson           Date of Birth: 11/07/1950           MRN: [id number] Visit Date: 10/09/2023 Requested by: Sharon Seller, NP [address] [place] [place],  [place] [address] PCP: Sharon Seller, NP  Subjective: Chief Complaint  Patient presents with   Left Knee - Pain    HPI: Melissa Simpson is a 73 y.o. female who presents to the office reporting left knee pain.  Patient states "I want another injection".  Reports recurrent pain recently.  Pain does not wake her from sleep.  Does not report any locking but she does have a little catching and stiffness.  Last knee cortisone injection was 624.  Patient also reports trochanteric pain.  She had a trochanteric injection over a year ago and that helped her significantly.  Denies any groin pain..                ROS: All systems reviewed are negative as they relate to the chief complaint within the history of present illness.  Patient denies fevers or chills.  Assessment & Plan: Visit Diagnoses:  1. Greater trochanteric pain syndrome of right lower extremity   2. Arthritis of left knee     Plan: Impression is left knee arthritis.  Monovisc injected today.  Lot #[id number].  We also injected the right trochanteric region between the iliotibial band and greater trochanter with ultrasound-guided injection.  Plan also to try Celebrex which has helped her in the past 100 mg twice a day for 4 weeks then daily as needed for pain thereafter.  She will follow-up with [place] as needed.  Not really there yet for knee replacement.  Follow-Up Instructions: No follow-ups on file.   Orders:  Orders Placed This Encounter  Procedures   US Guided Needle Placement - No Linked Charges   Meds ordered this encounter  Medications   celecoxib (CELEBREX) 100 MG capsule    Sig: 1 po q d prn    Dispense:  60 capsule    Refill:  0      Procedures: Large Joint Inj: L knee on 10/09/2023 6:44 PM Indications: diagnostic evaluation,  joint swelling and pain Details: 18 G 1.5 in needle, superolateral approach  Arthrogram: No  Medications: 5 mL lidocaine 1 %; 40 mg methylPREDNISolone acetate 40 MG/ML; 4 mL bupivacaine 0.25 % Outcome: tolerated well, no immediate complications Procedure, treatment alternatives, risks and benefits explained, specific risks discussed. Consent was given by the patient. Immediately prior to procedure a time out was called to verify the correct patient, procedure, equipment, support staff and site/side marked as required. Patient was prepped and draped in the usual sterile fashion.    Large Joint Inj: R greater trochanter on 10/09/2023 6:45 PM Indications: pain and diagnostic evaluation Details: 22 G 3.5 in needle, ultrasound-guided lateral approach  Arthrogram: No  Medications: 5 mL lidocaine 1 %; 80 mg methylPREDNISolone acetate 80 MG/ML; 4 mL bupivacaine 0.25 % Outcome: tolerated well, no immediate complications Procedure, treatment alternatives, risks and benefits explained, specific risks discussed. Consent was given by the patient. Immediately prior to procedure a time out was called to verify the correct patient, procedure, equipment, support staff and site/side marked as required. Patient was prepped and draped in the usual sterile fashion.       Clinical Data: No additional findings.  Objective: Vital Signs: There were no vitals taken for this visit.  Physical Exam:  Constitutional: Patient appears well-developed HEENT:  Head: Normocephalic Eyes:EOM are normal Neck: Normal range of motion Cardiovascular: Normal rate Pulmonary/chest: Effort normal Neurologic: Patient is alert Skin: Skin is warm Psychiatric: Patient has normal mood and affect  Ortho Exam: Ortho exam demonstrates left knee with trace effusion.  Extensor mechanism intact.  5 degree flexion contracture but is able to bend past 90.  No groin pain with internal/external rotation of the leg.  Does have  trochanteric tenderness on the right with no groin pain on that side.  Hip abduction adduction and flexion strength is 5+ out of 5 bilaterally.  Specialty Comments:  No specialty comments available.  Imaging: No results found.   PMFS History: Patient Active Problem List   Diagnosis Date Noted   History of ovarian cancer 03/21/2020   Allergic sinusitis 03/21/2020   Hyperglycemia 08/13/2018   Genetic testing 07/21/2018   Rhinitis 07/01/2018   Lymphocele after surgical procedure 07/01/2018   Preventive measure 06/09/2018   Essential hypertension, benign 02/28/2017   Left medial knee pain 02/28/2017   Elevated liver function tests 02/27/2016   Hyperlipidemia 02/27/2016   Right shoulder pain 02/27/2016   Chronic right hip pain 02/27/2016   Family history of colon cancer 06/02/2015   Gastroesophageal reflux disease 06/02/2015   Left thyroid nodule 06/02/2015   Dyslipidemia 12/18/2013   Past Medical History:  Diagnosis Date   AKI (acute kidney injury) (HCC) 05/23/2018   Arthritis    in bilateral knees   Cancer (HCC)    left ovarian cancer   Family history of colon cancer    Family history of ovarian cancer    GERD (gastroesophageal reflux disease)    History of DVT (deep vein thrombosis) 2019   had a catheter-related DVT during chemotherapy   Hyperlipidemia    Hypertension    Steroid myopathy 09/22/2018    Family History  Problem Relation Age of Onset   Hypertension Mother    Arthritis Mother    Hypertension Father    Diabetes Father    Coronary artery disease Father    Hyperlipidemia Father    Colon cancer Father        54's   HLA-B27 positive Daughter    Cancer Maternal Aunt        type unk   Ovarian cancer Cousin 38   Cancer Cousin        mouth    Past Surgical History:  Procedure Laterality Date   ABDOMINAL HYSTERECTOMY     BIOPSY THYROID  2008   COLONOSCOPY     IR CV LINE INJECTION  07/15/2018   IR REMOVAL TUN ACCESS W/ PORT W/O FL MOD SED  11/24/2018    TONSILECTOMY, ADENOIDECTOMY, BILATERAL MYRINGOTOMY AND TUBES Bilateral 1957   Social History   Occupational History   Occupation: retired Architect  Tobacco Use   Smoking status: Never   Smokeless tobacco: Never  Vaping Use   Vaping status: Never Used  Substance and Sexual Activity   Alcohol use: No   Drug use: No   Sexual activity: Not on file

## 2023-10-10 ENCOUNTER — Encounter: Payer: Self-pay | Admitting: Orthopedic Surgery

## 2023-10-10 NOTE — Telephone Encounter (Signed)
Next available gel injection will need to be after 04/07/2024. Will submit for Monovisc, left knee early July, 2025

## 2023-10-13 MED ORDER — METHYLPREDNISOLONE ACETATE 40 MG/ML IJ SUSP
40.0000 mg | INTRAMUSCULAR | Status: AC | PRN
  Administered 2023-10-09: 40 mg via INTRA_ARTICULAR

## 2023-10-13 MED ORDER — LIDOCAINE HCL 1 % IJ SOLN
5.0000 mL | INTRAMUSCULAR | Status: AC | PRN
  Administered 2023-10-09: 5 mL

## 2023-10-13 MED ORDER — METHYLPREDNISOLONE ACETATE 80 MG/ML IJ SUSP
80.0000 mg | INTRAMUSCULAR | Status: AC | PRN
  Administered 2023-10-09: 80 mg via INTRA_ARTICULAR

## 2023-10-13 MED ORDER — BUPIVACAINE HCL 0.25 % IJ SOLN
4.0000 mL | INTRAMUSCULAR | Status: AC | PRN
  Administered 2023-10-09: 4 mL via INTRA_ARTICULAR

## 2023-10-16 ENCOUNTER — Ambulatory Visit: Payer: Medicare HMO | Admitting: Orthopedic Surgery

## 2023-11-04 ENCOUNTER — Ambulatory Visit (INDEPENDENT_AMBULATORY_CARE_PROVIDER_SITE_OTHER): Payer: Medicare HMO | Admitting: Nurse Practitioner

## 2023-11-04 ENCOUNTER — Encounter: Payer: Self-pay | Admitting: Nurse Practitioner

## 2023-11-04 VITALS — BP 128/76 | HR 60 | Temp 97.5°F | Ht <= 58 in | Wt 166.0 lb

## 2023-11-04 DIAGNOSIS — F5101 Primary insomnia: Secondary | ICD-10-CM | POA: Diagnosis not present

## 2023-11-04 DIAGNOSIS — M810 Age-related osteoporosis without current pathological fracture: Secondary | ICD-10-CM

## 2023-11-04 DIAGNOSIS — R079 Chest pain, unspecified: Secondary | ICD-10-CM

## 2023-11-04 DIAGNOSIS — M170 Bilateral primary osteoarthritis of knee: Secondary | ICD-10-CM

## 2023-11-04 DIAGNOSIS — N1832 Chronic kidney disease, stage 3b: Secondary | ICD-10-CM

## 2023-11-04 DIAGNOSIS — R053 Chronic cough: Secondary | ICD-10-CM | POA: Diagnosis not present

## 2023-11-04 DIAGNOSIS — E781 Pure hyperglyceridemia: Secondary | ICD-10-CM | POA: Diagnosis not present

## 2023-11-04 DIAGNOSIS — K219 Gastro-esophageal reflux disease without esophagitis: Secondary | ICD-10-CM | POA: Diagnosis not present

## 2023-11-04 DIAGNOSIS — I10 Essential (primary) hypertension: Secondary | ICD-10-CM | POA: Diagnosis not present

## 2023-11-04 DIAGNOSIS — R739 Hyperglycemia, unspecified: Secondary | ICD-10-CM

## 2023-11-04 NOTE — Progress Notes (Signed)
 Careteam: Patient Care Team: Sharon Seller, NP as PCP - General (Geriatric Medicine)  PLACE OF SERVICE:  [facility]  Advanced Directive information    Allergies  Allergen Reactions   Bee Pollen Other (See Comments)   Pollen Extract     Chief Complaint  Patient presents with   Medical Management of Chronic Issues    Routine visit. Chest pain (timeframe unknown)     HPI: pt is a 73 y.o. female  Discussed the use of AI scribe software for clinical note transcription with the patient, who gave verbal consent to proceed.  History of Present Illness   Melissa Simpson "Darel Hong" is a 74 year old female who presents for an annual wellness visit.  Melissa Simpson has osteoporosis managed with Fosamax weekly, along with calcium and vitamin D supplements. Melissa Simpson has switched from using Benadryl to melatonin for sleep, which is going well.  Indigestion is well-controlled with Protonix, resolving Melissa Simpson symptoms of acid reflux and indigestion. No current issues with these symptoms since starting the medication.  Melissa Simpson experiences chronic morning congestion, which has not improved with Protonix. Melissa Simpson takes Mucinex daily, which has improved Melissa Simpson cough. Melissa Simpson attributes the congestion to sinus issues and experiences postnasal drip but is not currently taking any medication for it.  Melissa Simpson has been prescribed Celebrex 100 mg for knee pain, initially instructed to take it twice daily for four weeks and then reduce to once daily. However, the prescription label indicated to take it as needed, and after clarification, Melissa Simpson is taking it once daily. Melissa Simpson uses it every day.  Melissa Simpson reports occasional chest pain, described as a 'little pain' lasting a few seconds, occurring possibly daily without any specific triggers. The pain is sometimes more pronounced on one side but does not radiate or cause shortness of breath.  Melissa Simpson has a history of elevated blood sugars and hyperglycemia, with a previous A1c of 6.2. Melissa Simpson is on losartan  and metoprolol for hypertension, which is controlled. Melissa Simpson is also on Zocor for dyslipidemia, with a history of elevated triglycerides.  No shortness of breath, changes in bowel or bladder habits, or abdominal or vaginal bleeding. Melissa Simpson has three dogs and walks them as a form of exercise.        Review of Systems:  Review of Systems  Constitutional:  Negative for chills, fever and weight loss.  HENT:  Negative for tinnitus.   Respiratory:  Positive for cough. Negative for sputum production and shortness of breath.   Cardiovascular:  Positive for chest pain. Negative for palpitations and leg swelling.  Gastrointestinal:  Negative for abdominal pain, constipation, diarrhea and heartburn.  Genitourinary:  Negative for dysuria, frequency and urgency.  Musculoskeletal:  Positive for joint pain. Negative for back pain, falls and myalgias.  Skin: Negative.   Neurological:  Negative for dizziness and headaches.  Psychiatric/Behavioral:  Negative for depression and memory loss. The patient does not have insomnia.     Past Medical History:  Diagnosis Date   AKI (acute kidney injury) (HCC) 05/23/2018   Arthritis    in bilateral knees   Cancer (HCC)    left ovarian cancer   Family history of colon cancer    Family history of ovarian cancer    GERD (gastroesophageal reflux disease)    History of DVT (deep vein thrombosis) 2019   had a catheter-related DVT during chemotherapy   Hyperlipidemia    Hypertension    Steroid myopathy 09/22/2018   Past Surgical History:  Procedure Laterality  Date   ABDOMINAL HYSTERECTOMY     BIOPSY THYROID  2008   COLONOSCOPY     IR CV LINE INJECTION  07/15/2018   IR REMOVAL TUN ACCESS W/ PORT W/O FL MOD SED  11/24/2018   TONSILECTOMY, ADENOIDECTOMY, BILATERAL MYRINGOTOMY AND TUBES Bilateral 1957   Social History:   reports that Melissa Simpson has never smoked. Melissa Simpson has never used smokeless tobacco. Melissa Simpson reports that Melissa Simpson does not drink alcohol and does not use drugs.  Family  History  Problem Relation Age of Onset   Hypertension Mother    Arthritis Mother    Hypertension Father    Diabetes Father    Coronary artery disease Father    Hyperlipidemia Father    Colon cancer Father        10's   [id number] positive Daughter    Cancer Maternal Aunt        type unk   Ovarian cancer Cousin 42   Cancer Cousin        mouth    Medications: Patient's Medications  New Prescriptions   No medications on file  Previous Medications   ALENDRONATE (FOSAMAX) 70 MG TABLET    TAKE 1 TABLET EVERY WEEK WITH A FULL GLASS OF WATER ON AN EMPTY STOMACH   ASCORBIC ACID (VITAMIN C PO)    Take 500 mg by mouth daily.   ASPIRIN EC 81 MG TABLET    Take 81 mg by mouth daily. Swallow whole.   BLACK COHOSH PO    Take 1 capsule by mouth daily.   CALCIUM-MAGNESIUM-VITAMIN D (CALCIUM 1200+D3 PO)    Take 1 tablet by mouth 2 (two) times daily.   CELECOXIB (CELEBREX) 100 MG CAPSULE    1 po q d prn   GLUCOSAMINE HCL ([facility] GLUCOSAMINE PO)    2 tabs by mouth in the morning and 1 tab by mouth at night   LOSARTAN (COZAAR) 50 MG TABLET    TAKE 1 TABLET EVERY DAY   METOPROLOL TARTRATE (LOPRESSOR) 50 MG TABLET    TAKE 1 TABLET TWICE DAILY   MULTIPLE VITAMIN (MULTIVITAMIN) TABLET    Take 1 tablet by mouth 2 (two) times daily.    OMEGA-3 FATTY ACIDS (OMEGA-3 FISH OIL PO)    Take 1 tablet by mouth 2 (two) times daily.   PANTOPRAZOLE (PROTONIX) 40 MG TABLET    TAKE 1 TABLET EVERY DAY   PROBIOTIC PRODUCT (PROBIOTIC-10 PO)    Take 1 tablet by mouth daily.   SIMVASTATIN (ZOCOR) 20 MG TABLET    TAKE 1 TABLET EVERY DAY  Modified Medications   No medications on file  Discontinued Medications   No medications on file    Physical Exam:  Vitals:   11/04/23 0852  BP: 128/76  Pulse: 60  Temp: (!) 97.5 F (36.4 C)  TempSrc: Temporal  SpO2: 92%  Weight: 166 lb (75.3 kg)  Height: 4\' 10"  (1.473 m)   Body mass index is 34.69 kg/m. Wt Readings from Last 3 Encounters:  11/04/23 166 lb (75.3 kg)  09/27/23  166 lb (75.3 kg)  06/17/23 168 lb 3.2 oz (76.3 kg)    Physical Exam Constitutional:      General: Melissa Simpson is not in acute distress.    Appearance: Melissa Simpson is well-developed. Melissa Simpson is not diaphoretic.  HENT:     Head: Normocephalic and atraumatic.     Mouth/Throat:     Pharynx: No oropharyngeal exudate.  Eyes:     Conjunctiva/sclera: Conjunctivae normal.     Pupils:  Pupils are equal, round, and reactive to light.  Cardiovascular:     Rate and Rhythm: Normal rate and regular rhythm.     Heart sounds: Normal heart sounds.  Pulmonary:     Effort: Pulmonary effort is normal.     Breath sounds: Normal breath sounds.  Abdominal:     General: Bowel sounds are normal.     Palpations: Abdomen is soft.  Musculoskeletal:     Cervical back: Normal range of motion and neck supple.     Right lower leg: No edema.     Left lower leg: No edema.  Skin:    General: Skin is warm and dry.  Neurological:     Mental Status: Melissa Simpson is alert.  Psychiatric:        Mood and Affect: Mood normal.     Labs reviewed: Basic Metabolic Panel: Recent Labs    04/08/23 1031  NA 138  K 4.4  CL 100  CO2 28  GLUCOSE 98  BUN 24  CREATININE 1.10*  CALCIUM 10.2   Liver Function Tests: Recent Labs    04/08/23 1031  AST 17  ALT 20  BILITOT 1.1  PROT 7.2   No results for input(s): "LIPASE", "AMYLASE" in the last 8760 hours. No results for input(s): "AMMONIA" in the last 8760 hours. CBC: Recent Labs    04/08/23 1031  WBC 7.9  NEUTROABS 5,017  HGB 16.1*  HCT 46.5*  MCV 97.3  PLT 295   Lipid Panel: Recent Labs    04/08/23 1031  CHOL 180  HDL 55  LDLCALC 94  TRIG 215*  CHOLHDL 3.3   TSH: No results for input(s): "TSH" in the last 8760 hours. A1C: Lab Results  Component Value Date   HGBA1C 6.2 (H) 04/08/2023     Assessment/Plan Assessment and Plan    Chest Pain Brief, daily, non-exertional chest pain, not associated with shortness of breath or radiation to the arms. No clear trigger  identified. -EKG consistent with prior however due to family hx of CAD will refer to cardiology for further evaluation   Chronic cough/ post nasal drip Persistent despite Protonix for possible reflux-related symptoms. Possible postnasal drip. -Start Zyrtec (cetirizine) daily for 4-6 weeks. -Consider referral to Pulmonology if symptoms persist.  Gastroesophageal Reflux Disease (GERD) Well-controlled on Protonix. -Continue Protonix.  Osteoporosis On Fosamax, calcium, and vitamin D. -Continue current regimen.  Osteoarthritis of knee Recently started on Celebrex by Orthopedist. Noted potential risks to renal function, stomach, and heart. -Use Celebrex sparingly, only when pain is not controlled by Tylenol. -Check renal function today due to daily Celebrex use.  Hyperglycemia Previous A1c of 6.2. -Check A1c today. -Encourage increased physical activity, particularly in the morning.  Dyslipidemia On Zocor. -Check lipid panel today.  Insomnia On melatonin. -Continue melatonin.  Gastroesophageal reflux disease, unspecified whether esophagitis present Controlled on protonix, continue current regimen with dietary modifications   Stage 3b chronic kidney disease (HCC) -     COMPLETE METABOLIC PANEL WITH GFR -Encourage proper hydration and to avoid NSAIDS (Aleve, Advil, Motrin, Ibuprofen)   General Health Maintenance -Check labs today including renal function, A1c, and lipid panel. -Follow-up in 6 months or sooner if needed.    Return in about 6 months (around 05/03/2024) for routine follow up, labs at time of visit.  Janene Harvey. Biagio Borg [facility] [phone]

## 2023-11-04 NOTE — Patient Instructions (Signed)
 To take Loratadine or cetrizine (generic for Claritin or zyrtec) 10 mg by mouth daily for post nasal drip/allergies.

## 2023-11-05 ENCOUNTER — Encounter: Payer: Self-pay | Admitting: Nurse Practitioner

## 2023-11-05 LAB — COMPLETE METABOLIC PANEL WITH GFR
AG Ratio: 1.7 (calc) (ref 1.0–2.5)
ALT: 19 U/L (ref 6–29)
AST: 20 U/L (ref 10–35)
Albumin: 4.3 g/dL (ref 3.6–5.1)
Alkaline phosphatase (APISO): 30 U/L — ABNORMAL LOW (ref 37–153)
BUN/Creatinine Ratio: 18 (calc) (ref 6–22)
BUN: 19 mg/dL (ref 7–25)
CO2: 26 mmol/L (ref 20–32)
Calcium: 9.6 mg/dL (ref 8.6–10.4)
Chloride: 103 mmol/L (ref 98–110)
Creat: 1.08 mg/dL — ABNORMAL HIGH (ref 0.60–1.00)
Globulin: 2.5 g/dL (ref 1.9–3.7)
Glucose, Bld: 96 mg/dL (ref 65–99)
Potassium: 4.1 mmol/L (ref 3.5–5.3)
Sodium: 139 mmol/L (ref 135–146)
Total Bilirubin: 1.2 mg/dL (ref 0.2–1.2)
Total Protein: 6.8 g/dL (ref 6.1–8.1)
eGFR: 54 mL/min/{1.73_m2} — ABNORMAL LOW (ref >=60)

## 2023-11-05 LAB — CBC WITH DIFFERENTIAL/PLATELET
Absolute Lymphocytes: 1399 {cells}/uL (ref 850–3900)
Absolute Monocytes: 721 {cells}/uL (ref 200–950)
Basophils Absolute: 53 {cells}/uL (ref 0–200)
Basophils Relative: 0.5 %
Eosinophils Absolute: 223 {cells}/uL (ref 15–500)
Eosinophils Relative: 2.1 %
HCT: 43.6 % (ref 35.0–45.0)
Hemoglobin: 14.8 g/dL (ref 11.7–15.5)
MCH: 33.8 pg — ABNORMAL HIGH (ref 27.0–33.0)
MCHC: 33.9 g/dL (ref 32.0–36.0)
MCV: 99.5 fL (ref 80.0–100.0)
MPV: 10.2 fL (ref 7.5–12.5)
Monocytes Relative: 6.8 %
Neutro Abs: 8204 {cells}/uL — ABNORMAL HIGH (ref 1500–7800)
Neutrophils Relative %: 77.4 %
Platelets: 257 10*3/uL (ref 140–400)
RBC: 4.38 10*6/uL (ref 3.80–5.10)
RDW: 11.7 % (ref 11.0–15.0)
Total Lymphocyte: 13.2 %
WBC: 10.6 10*3/uL (ref 3.8–10.8)

## 2023-11-05 LAB — HEMOGLOBIN A1C
Hgb A1c MFr Bld: 6.4 %{Hb} — ABNORMAL HIGH (ref <5.7)
Mean Plasma Glucose: 137 mg/dL
eAG (mmol/L): 7.6 mmol/L

## 2023-11-05 LAB — LIPID PANEL
Cholesterol: 141 mg/dL (ref <200)
HDL: 46 mg/dL — ABNORMAL LOW (ref >=50)
LDL Cholesterol (Calc): 70 mg/dL
Non-HDL Cholesterol (Calc): 95 mg/dL (ref <130)
Total CHOL/HDL Ratio: 3.1 (calc) (ref <5.0)
Triglycerides: 172 mg/dL — ABNORMAL HIGH (ref <150)

## 2023-11-14 ENCOUNTER — Ambulatory Visit: Payer: Medicare HMO | Admitting: Gynecologic Oncology

## 2023-11-14 ENCOUNTER — Other Ambulatory Visit: Payer: Medicare HMO

## 2023-11-27 ENCOUNTER — Encounter: Payer: Self-pay | Admitting: Gynecologic Oncology

## 2023-12-05 ENCOUNTER — Encounter: Payer: Self-pay | Admitting: Gynecologic Oncology

## 2023-12-05 ENCOUNTER — Inpatient Hospital Stay: Payer: Medicare HMO | Attending: Gynecologic Oncology | Admitting: Gynecologic Oncology

## 2023-12-05 ENCOUNTER — Other Ambulatory Visit: Payer: Self-pay | Admitting: Nurse Practitioner

## 2023-12-05 ENCOUNTER — Inpatient Hospital Stay: Payer: Medicare HMO | Admitting: Gynecologic Oncology

## 2023-12-05 VITALS — BP 146/60 | HR 56 | Temp 98.7°F | Resp 20 | Wt 166.6 lb

## 2023-12-05 DIAGNOSIS — Z1502 Genetic susceptibility to malignant neoplasm of ovary: Secondary | ICD-10-CM | POA: Insufficient documentation

## 2023-12-05 DIAGNOSIS — Z90710 Acquired absence of both cervix and uterus: Secondary | ICD-10-CM | POA: Insufficient documentation

## 2023-12-05 DIAGNOSIS — Z1501 Genetic susceptibility to malignant neoplasm of breast: Secondary | ICD-10-CM | POA: Diagnosis not present

## 2023-12-05 DIAGNOSIS — Z8542 Personal history of malignant neoplasm of other parts of uterus: Secondary | ICD-10-CM

## 2023-12-05 DIAGNOSIS — Z90722 Acquired absence of ovaries, bilateral: Secondary | ICD-10-CM | POA: Insufficient documentation

## 2023-12-05 DIAGNOSIS — Z8543 Personal history of malignant neoplasm of ovary: Secondary | ICD-10-CM | POA: Diagnosis not present

## 2023-12-05 DIAGNOSIS — Z08 Encounter for follow-up examination after completed treatment for malignant neoplasm: Secondary | ICD-10-CM

## 2023-12-05 DIAGNOSIS — Z9221 Personal history of antineoplastic chemotherapy: Secondary | ICD-10-CM | POA: Insufficient documentation

## 2023-12-05 DIAGNOSIS — Z1509 Genetic susceptibility to other malignant neoplasm: Secondary | ICD-10-CM | POA: Diagnosis not present

## 2023-12-05 DIAGNOSIS — I10 Essential (primary) hypertension: Secondary | ICD-10-CM

## 2023-12-05 DIAGNOSIS — C562 Malignant neoplasm of left ovary: Secondary | ICD-10-CM

## 2023-12-05 DIAGNOSIS — Z148 Genetic carrier of other disease: Secondary | ICD-10-CM | POA: Diagnosis not present

## 2023-12-05 NOTE — Progress Notes (Signed)
 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 Large pelvic mass with heterogeneous appearance, measuring at least 15 centimeters in diameter. Although the findings favor enlarged uterus/fibroids, ovaries are not well seen and an ovarian mass should also be considered. Further characterization with pelvic ultrasound is recommended. 2. The bladder is completely decompressed. There is LEFT-sided hydronephrosis secondary to extrinsic compression from the pelvic mass. 3. Irregular thickening of the sigmoid colon, suspicious for colonic wall mass, measuring 1.3 centimeters. In this patient with a family history of colon cancer, further evaluation with colonoscopy is recommended. 4. Colonic diverticula without acute diverticulitis. 5. Moderate hiatal hernia.     05/15/2018 Imaging   MRI pelvis 16 cm complex cystic and solid mass which is centered in the left posterior adnexa and is contiguous with the uterus. This is suspicious for ovarian carcinoma, with differential diagnosis of atypical pedunculated fibroid with peripheral cystic degeneration. Surgical evaluation is recommended.   Other uterine fibroids measuring up to 4.8 cm.   No evidence of pelvic metastatic disease.     05/16/2018 Imaging   US pelvis 1. Large pelvic mass described in detail on MRI examination 1 day prior. 2. Slight ureteral prominence bilaterally. Mild fullness of the left renal collecting system. No obstructing focus evident. Suspect fullness of these structures due to  ureteral compression by the sizable pelvic mass.     05/22/2018 Tumor Marker   Patient's tumor was tested for the following markers: CA-125 Results of the tumor marker test revealed 122   05/27/2018 Surgery   Preoperative Diagnoses: Pelvic mass  Postoperative Diagnoses: left fallopian tube and ovary consistent with high-grade carcinoma of likely GYN origin. No evidence of metastatic disease.  Procedures: Exploratory laparotomy, total abdominal hysterectomy, bilateral salpingo-oophorectomy, peritoneal biopsies for ovarian cancer staging, bilateral pelvic lymphadenectomy, bilateral para-aortic lymphadenectomy, infracolic omentectomy, R0 resection   Surgeon: De Blanch, MD  Assistants: Janalyn Rouse, MD - Fellow, Raynelle Bring, MD - Resident, Lovenia Kim, PA-S  Findings: On BME, the cervix is retracted anteriorly with no palpable adnexal fullness. There is no RV septum nodularity. Abdominal exam reveals a mobile large mass that extends to the umbilicus and nearly to the pelvic side walls. On abdominal entry, there was immediate green-brown pelvic fluid that appeared consistent with a spontaneous rupture of the cystic fluid-filled mass. The left ovary was a large cystic complex mass, roughly 15cm in size with smooth surface but containing friable tumor. The mass was densely adherent along the entire posterior wall of the uterus. The uterus had some small intramural fibroids and was roughly 10cm in total size. The right fallopian tube and ovary were normal appearing. The patient has smooth paracolic gutters, liver edge, diaphragm, spleen and stomach. The omentum was retracted but no evidence of nodularity or gross tumor. The anterior cul-de-sac was clear and the posterior-cul-de-sac peritoneum was clear, but the left ovarian mass wall was adherent to the rectosigmoid and cul-de-sac. There were no palpable pelvic or para-aortic adenopathy.   Of note, an the conclusion of the case, there  was mild  RIGHT hydronephrosis noted. The ureter was traced from the level of the right common iliac into the anterior utero-vesical ligament. There was an area at the pelvic brim were the hydronephrosis resolved to normal caliber. There was no tethering, stricture, or apparent injury to the ureter. Based on exam, this seemed consistent with packing and retraction causing some transient dilation that resolved with packing and retractor removal.   Intraoperative frozen section of the left ovary was consistent with high-grade carcinoma of GYN origin, possibly serous. There was no gross evidence of disease at the conclusion of the case, R0 resection.  Specimens: 1) Pelvic washings 2) Uterus with cervix  3) Left ovary and fallopian tube, IOFS c/w high-grade GYN carcinoma 4) Right fallopian tube and ovary 5) Peritoneal biopsies - anterior cul-de-sac peritoneum, posterior cul-de-sac peritoneum, left pelvic side wall peritoneum, right pelvic side wall peritoneum 6) Right and left pelvic lymph nodes 7) Right and left para-aortic lymph nodes 8) Omentum 9) Anaerobic/Aerobic culture - preliminary read is no organisms    05/27/2018 Pathology Results   A: Ovary and fallopian tube, left, salpingo-oophorectomy - Clear cell carcinoma of left ovary, size 14.0 cm, with necrosis  - Carcinoma is adherent to fallopian tube, consistent with surface involvement (stage pT1c2) - Focal endometriosis present - See synoptic report and comment  B: Uterus with cervix and right ovary and fallopian tube, hysterectomy and right salpingo-oophorectomy Cervix: Benign with Nabothian cysts Endometrium: Endometrial polyp (size 3.5 cm); inactive endometrium with cystic glandular changes Myometrium: Leiomyomata with hyalinization and calcification (size up to 4.2 cm); focal adenomyosis  Right ovary: Benign physiologic changes Right fallopian tube: Small paratubal cyst and no malignancy identified  C: Lymph nodes, right pelvic,  regional resection - Nine lymph nodes with no metastatic carcinoma identified (0/9)  D: Lymph nodes, left pelvic, regional resection - Five lymph nodes with no metastatic carcinoma identified (0/5)  E: Cul-de-sac, anterior, biopsy - Fibroadipose tissue with no carcinoma identified  F: Cul-de-sac, posterior, biopsy - Fibrous tissue with inflammation and no carcinoma identified  G: Pelvic sidewall, right, biopsy - Fibroadipose tissue with focal crushed CD10 positive cells, cannot exclude endometrial type stroma - No carcinoma identified  H: Pelvic sidewall, left, biopsy - Fibroadipose tissue with focal crushed cells, cannot exclude endometrial type stroma - No carcinoma identified  I: Omentum, omentectomy - Adipose and fibrovascular tissue consistent with omentum - No carcinoma identified  J: Lymph nodes, left periaortic, regional resection - Two small lymph nodes with no metastatic carcinoma identified (0/2)  K: Lymph nodes, right periaortic, regional resection - Three small lymph nodes with no metastatic carcinoma identified (0/3)  This electronic signature is attestation that the pathologist personally reviewed the submitted material(s) and the final diagnosis reflects that evaluation.     Synoptic Report     OVARY or FALLOPIAN TUBE or PRIMARY PERITONEUM  (Ovary FT Perit - All Specimens)    SPECIMEN    Procedure:    Total hysterectomy and bilateral salpingo-oophorectomy     Procedure:    Omentectomy     Procedure:    Peritoneal  biopsies     Procedure:    Peritoneal washing   :        Specimen Integrity of Left Ovary:    Received largely intact with multiple areas of disruption   Tumor Site:    Left ovary   Histologic Type:    Clear cell carcinoma   Histologic Grade:    High grade   :  Tumor Size:    Greatest dimension in Centimeters (cm): 14.0 Centimeters (cm)      Additional Dimension in Centimeters (cm):    10 Centimeters (cm)      Additional Dimension in  Centimeters (cm):    7 Centimeters (cm)  Tumor Extent:        Ovarian Surface Involvement:    Present       Laterality:    Left     Other Tissue / Organ Involvement:    Not identified     Peritoneal Ascitic Fluid:    Negative for malignancy (normal / benign)     Pleural Fluid:    Not submitted / unknown   Accessory Findings:      LYMPH NODES  Regional Lymph Nodes:    All lymph nodes negative for tumor cells   Number of Lymph Nodes Examined:    19     Site(s):    Right pelvic: 9     Site(s):    Left pelvic: 5     Site(s):    Right para-aortic: 3     Site(s):    Left para-aortic: 2   PATHOLOGIC STAGE CLASSIFICATION (pTNM, AJCC 8th Edition)  Primary Tumor (pT):    pT1c2   Regional Lymph Nodes (pN):    pN0   FIGO STAGE  FIGO Stage:    IC2   ADDITIONAL FINDINGS  Additional Pathologic Findings:    Endometriosis   Additional Pathologic Findings:    Benign fallopian tube with adherence to carcinoma; also see additional parts   Comment(s)  Comment(s):    Also see diagnosis comment.      The frozen section diagnosis is confirmed for specimen A. Sections demonstrate a carcinoma with tubulocystic and papillary architecture with hyalinized cores, lined by cells with high grade nuclei, clear to eosinophilic cytoplasm, and nuclear hobnailing.  The morphologic features are most suggestive of clear cell carcinoma.  Immunohistochemical stains are performed, and demonstrate that the carcinoma is positive for CK7 and PAX8, consistent with gynecologic origin. It has patchy positivity for Napsin A, wild type p53 staining, and is negative for ER, supporting the diagnosis of clear cell carcinoma.   Sections of the tumor demonstrate carcinoma with adherence to fallopian tube surface, consistent with surface involvement.  Per the operative note, findings were also suggestive of preoperative rupture of the mass.  Given both these findings, the tumor is staged as a pT1c2 (FIGO IC2)            05/27/2018  Genetic Testing   Patient has genetic testing done for genetic disorder Results revealed patient has the following mutation(s): BRCA 1 on tissue block, neg blood   06/04/2018 Procedure   She had port placement   06/05/2018 Cancer Staging   Staging form: Ovary, Fallopian Tube, and Primary Peritoneal Carcinoma, AJCC 8th Edition - Pathologic: FIGO Stage IC2, calculated as Stage IC (pT1c2, pN0, cM0) - Signed by Artis Delay, MD on 06/05/2018   06/27/2018 Imaging   1. No evidence of residual/recurrent tumor in the pelvis. 2. No evidence of metastatic disease in the chest, abdomen or pelvis. 3. Left pelvic sidewall 6.3 x 5.0 cm simple fluid collection, compatible with postsurgical seroma or lymphocele. 4. Small hiatal hernia. 5.  Aortic Atherosclerosis (ICD10-I70.0).   06/27/2018 Tumor Marker   Patient's tumor was tested for the following markers: CA-125 Results of the tumor marker test revealed 90.3   07/01/2018 - 10/14/2018 Chemotherapy   The patient had palonosetron (ALOXI) injection 0.25 mg,  0.25 mg, Intravenous,  Once, 6 of 6 cycles  Administration: 0.25 mg (07/01/2018), 0.25 mg (07/22/2018), 0.25 mg (08/12/2018), 0.25 mg (09/02/2018), 0.25 mg (09/23/2018), 0.25 mg (10/14/2018)    CARBOplatin (PARAPLATIN) 420 mg in sodium chloride 0.9 % 250 mL chemo infusion, 420 mg (98.3 % of original dose 425.4 mg), Intravenous,  Once, 6 of 6 cycles  Dose modification:   (original dose 425.4 mg, Cycle 1), 380 mg (original dose 425.4 mg, Cycle 5, Reason: Dose not tolerated), 380 mg (original dose 425.4 mg, Cycle 6, Reason: Dose not tolerated)  Administration: 420 mg (07/01/2018), 440 mg (07/22/2018), 380 mg (08/12/2018), 440 mg (09/02/2018), 380 mg (09/23/2018), 380 mg (10/14/2018)    PACLitaxel (TAXOL) 270 mg in sodium chloride 0.9 % 250 mL chemo infusion (> 80mg /m2), 175 mg/m2 = 270 mg, Intravenous,  Once, 6 of 6 cycles  Administration: 270 mg (07/01/2018), 270 mg (07/22/2018), 270 mg (08/12/2018), 270 mg  (09/02/2018), 270 mg (09/23/2018), 270 mg (10/14/2018)    fosaprepitant (EMEND) 150 mg, dexamethasone (DECADRON) 12 mg in sodium chloride 0.9 % 145 mL IVPB, , Intravenous,  Once, 6 of 6 cycles  Administration:  (07/01/2018),  (07/22/2018),  (08/12/2018),  (09/02/2018),  (09/23/2018),  (10/14/2018)  for chemotherapy treatment.     07/15/2018 Imaging   Vascular US Findings consistent with acute deep vein thrombosis involving the right internal jugular veins. She is placed on Xarelto   07/21/2018 Tumor Marker   Patient's tumor was tested for the following markers: CA-125 Results of the tumor marker test revealed 22.8   09/22/2018 Tumor Marker   Patient's tumor was tested for the following markers: CA-125 Results of the tumor marker test revealed 14.5   11/10/2018 Imaging   Status post hysterectomy and bilateral salpingo-oophorectomy.   4.9 cm improving postoperative seroma along the left pelvic sidewall.   No evidence of recurrent or metastatic disease.     11/10/2018 Tumor Marker   Patient's tumor was tested for the following markers: CA-125 Results of the tumor marker test revealed 12.6   11/24/2018 Procedure   Successful removal of implanted Port-A-Cath.     Interval History: Patient reports overall doing well.  She denies any abdominal or pelvic pain.  Reports baseline bowel bladder function.  Endorses a good appetite.  Struggling with some joint pain in her knees, right hip, low back.  Past Medical/Surgical History: Past Medical History:  Diagnosis Date   AKI (acute kidney injury) (HCC) 05/23/2018   Arthritis    in bilateral knees   Cancer (HCC)    left ovarian cancer   Family history of colon cancer    Family history of ovarian cancer    GERD (gastroesophageal reflux disease)    History of DVT (deep vein thrombosis) 2019   had a catheter-related DVT during chemotherapy   Hyperlipidemia    Hypertension    Steroid myopathy 09/22/2018    Past Surgical History:  Procedure  Laterality Date   ABDOMINAL HYSTERECTOMY     BIOPSY THYROID  2008   COLONOSCOPY     IR CV LINE INJECTION  07/15/2018   IR REMOVAL TUN ACCESS W/ PORT W/O FL MOD SED  11/24/2018   TONSILECTOMY, ADENOIDECTOMY, BILATERAL MYRINGOTOMY AND TUBES Bilateral 1957    Family History  Problem Relation Age of Onset   Hypertension Mother    Arthritis Mother    Hypertension Father    Diabetes Father    Coronary artery disease Father    Hyperlipidemia Father    Colon cancer Father  60's   [id number] positive Daughter    Cancer Maternal Aunt        type unk   Ovarian cancer Cousin 59   Cancer Cousin        mouth    Social History   Socioeconomic History   Marital status: Married    Spouse name: Harvie Heck   Number of children: 3   Years of education: Not on file   Highest education level: Some college, no degree  Occupational History   Occupation: retired Architect  Tobacco Use   Smoking status: Never   Smokeless tobacco: Never  Vaping Use   Vaping status: Never Used  Substance and Sexual Activity   Alcohol use: No   Drug use: No   Sexual activity: Not on file  Other Topics Concern   Not on file  Social History Narrative   Diet:Unrestricted   Do you drink/eat things with caffeine? Rarely   Marital status: Married                             What year were you married? 1992   Do you live in a house, apartment, assisted living, condo, trailer, etc)? House   Is it one or more stories? 1   How many persons live in your home? 3   Do you have any pets in your home?  2 small dogs   Current or past profession: Dispensing optician   Do you exercise?                                                     Type & how often:   Do you have a living will? No   Do you have a DNR Form? No   Do you have a POA/HPOA forms? NO   Social Drivers of Corporate investment banker Strain: Low Risk  (10/31/2023)   Overall Financial Resource Strain (CARDIA)    Difficulty of Paying Living Expenses:  Not hard at all  Food Insecurity: No Food Insecurity (10/31/2023)   Hunger Vital Sign    Worried About Running Out of Food in the Last Year: Never true    Ran Out of Food in the Last Year: Never true  Transportation Needs: No Transportation Needs (10/31/2023)   PRAPARE - Administrator, Civil Service (Medical): No    Lack of Transportation (Non-Medical): No  Physical Activity: Unknown (10/31/2023)   Exercise Vital Sign    Days of Exercise per Week: 0 days    Minutes of Exercise per Session: Not on file  Stress: No Stress Concern Present (10/31/2023)   [employer] of Occupational Health - Occupational Stress Questionnaire    Feeling of Stress : Not at all  Social Connections: Moderately Integrated (10/31/2023)   Social Connection and Isolation Panel [NHANES]    Frequency of Communication with Friends and Family: Once a week    Frequency of Social Gatherings with Friends and Family: Once a week    Attends Religious Services: More than 4 times per year    Active Member of [employer] or Organizations: Yes    Attends Engineer, structural: More than 4 times per year    Marital Status: Married    Current Medications:  Current Outpatient Medications:  alendronate (FOSAMAX) 70 MG tablet, TAKE 1 TABLET EVERY WEEK WITH A FULL GLASS OF WATER ON AN EMPTY STOMACH, Disp: 12 tablet, Rfl: 3   Ascorbic Acid (VITAMIN C PO), Take 500 mg by mouth daily., Disp: , Rfl:    aspirin EC 81 MG tablet, Take 81 mg by mouth daily. Swallow whole., Disp: , Rfl:    BLACK COHOSH PO, Take 1 capsule by mouth daily., Disp: , Rfl:    Calcium-Magnesium-Vitamin D (CALCIUM 1200+D3 PO), Take 1 tablet by mouth 2 (two) times daily., Disp: , Rfl:    celecoxib (CELEBREX) 100 MG capsule, 1 po q d prn, Disp: 60 capsule, Rfl: 0   Glucosamine HCl ([facility] GLUCOSAMINE PO), 2 tabs by mouth in the morning and 1 tab by mouth at night, Disp: , Rfl:    losartan (COZAAR) 50 MG tablet, TAKE 1 TABLET EVERY DAY, Disp: 90  tablet, Rfl: 3   metoprolol tartrate (LOPRESSOR) 50 MG tablet, TAKE 1 TABLET TWICE DAILY, Disp: 180 tablet, Rfl: 3   Multiple Vitamin (MULTIVITAMIN) tablet, Take 1 tablet by mouth 2 (two) times daily. , Disp: , Rfl:    Omega-3 Fatty Acids (OMEGA-3 FISH OIL PO), Take 1 tablet by mouth 2 (two) times daily., Disp: , Rfl:    pantoprazole (PROTONIX) 40 MG tablet, TAKE 1 TABLET EVERY DAY, Disp: 90 tablet, Rfl: 1   Probiotic Product (PROBIOTIC-10 PO), Take 1 tablet by mouth daily., Disp: , Rfl:    simvastatin (ZOCOR) 20 MG tablet, TAKE 1 TABLET EVERY DAY, Disp: 90 tablet, Rfl: 3  Review of Systems: Denies appetite changes, fevers, chills, fatigue, unexplained weight changes. Denies hearing loss, neck lumps or masses, mouth sores, ringing in ears or voice changes. Denies cough or wheezing.  Denies shortness of breath. Denies chest pain or palpitations. Denies leg swelling. Denies abdominal distention, pain, blood in stools, constipation, diarrhea, nausea, vomiting, or early satiety. Denies pain with intercourse, dysuria, frequency, hematuria or incontinence. Denies hot flashes, pelvic pain, vaginal bleeding or vaginal discharge.   Denies itching, rash, or wounds. Denies dizziness, headaches, numbness or seizures. Denies swollen lymph nodes or glands, denies easy bruising or bleeding. Denies anxiety, depression, confusion, or decreased concentration.  Physical Exam: BP (!) 146/60 Comment: manual recheck  Pulse (!) 56   Temp 98.7 F (37.1 C) (Oral)   Resp 20   Wt 166 lb 9.6 oz (75.6 kg)   SpO2 95%   BMI 34.82 kg/m  General: Alert, oriented, no acute distress. HEENT: Normocephalic, atraumatic, sclera anicteric. Chest: Unlabored breathing on room air. Abdomen: Obese, soft, nontender.  Normoactive bowel sounds.  No masses or hepatosplenomegaly appreciated.  Well-healed incisions. Extremities: Grossly normal range of motion.  Warm, well perfused.  No edema bilaterally. Skin: No rashes or  lesions noted. Lymphatics: No cervical, supraclavicular, or inguinal adenopathy. GU: Normal appearing external genitalia without erythema, excoriation, or lesions.  Speculum exam reveals atrophic vaginal mucosa, no lesions or masses.  Bimanual exam reveals cuff intact, no nodularity or masses.  Rectovaginal exam confirms these findings.  Laboratory & Radiologic Studies:          Component Ref Range & Units (hover) 6 mo ago (05/17/23) 1 yr ago (11/22/22) 1 yr ago (04/16/22) 2 yr ago (11/03/21) 2 yr ago (04/26/21) 3 yr ago (10/25/20) 3 yr ago (07/26/20)  Cancer Antigen (CA) 125 19.8 18.2 CM 16.0 CM 16.2 CM 16.3 CM 14.2 CM 12.6 CM     Assessment & Plan: Melissa Simpson is a 73 y.o. woman with  Stage IC2 status post staging surgery in 05/2018 and completion of adjuvant chemotherapy in 10/2018 who presents for surveillance. Somatic BRCA1 mutation.   The patient is doing well and continues to be NED on exam.  She had her CA-125 drawn today and it will release to her tomorrow in [place].   She is now more than 5 years from completion of adjuvant therapy, we will transition to yearly visits that can be with her OB/GYN or PCP.  Also offered that she could come back and see Melissa in 1 year.  She would like to plan to see her primary care provider.  Discussed that she could continue getting a CA125 every 12 months.  We discussed signs and symptoms that if she were to develop should prompt a phone call to be seen sooner.  20 minutes of total time was spent for this patient encounter, including preparation, face-to-face counseling with the patient and coordination of care, and documentation of the encounter.  Eugene Garnet, MD  Division of Gynecologic Oncology  Department of Obstetrics and Gynecology  [facility]

## 2023-12-05 NOTE — Patient Instructions (Signed)
 It was good to see you today.  I do not see or feel any evidence of cancer recurrence on your exam.  I would recommend at least yearly follow-up.  Can continue to check a CA125 at the time of that follow-up. If you decide you would like to come back and see Melissa in our clinic in 1 year, please call the clinic to schedule that.  As always, if you develop any new and concerning symptoms, please notify me.  These include abdominal/pelvic pain, change to your bowel or bladder function, unintentional weight loss.

## 2023-12-06 ENCOUNTER — Encounter: Payer: Self-pay | Admitting: Gynecologic Oncology

## 2023-12-06 LAB — CA 125: Cancer Antigen (CA) 125: 18.4 U/mL (ref 0.0–38.1)

## 2024-01-20 ENCOUNTER — Other Ambulatory Visit: Payer: Self-pay | Admitting: Nurse Practitioner

## 2024-01-20 DIAGNOSIS — K219 Gastro-esophageal reflux disease without esophagitis: Secondary | ICD-10-CM

## 2024-01-29 DIAGNOSIS — Z1231 Encounter for screening mammogram for malignant neoplasm of breast: Secondary | ICD-10-CM | POA: Diagnosis not present

## 2024-01-29 DIAGNOSIS — Z01419 Encounter for gynecological examination (general) (routine) without abnormal findings: Secondary | ICD-10-CM | POA: Diagnosis not present

## 2024-02-01 ENCOUNTER — Other Ambulatory Visit: Payer: Self-pay | Admitting: Nurse Practitioner

## 2024-02-01 DIAGNOSIS — E781 Pure hyperglyceridemia: Secondary | ICD-10-CM

## 2024-02-06 NOTE — Progress Notes (Unsigned)
 Cardiology Office Note   Date:  02/07/2024   ID:  Gredmarie, Delange 1951/03/11, MRN [id number]  PCP:  Verma Gobble, NP  Cardiologist:   None Referring:  Verma Gobble, NP  No chief complaint on file.     History of Present Illness: Melissa Simpson is a 73 y.o. female who presents for evaluation of chest pain.    She was seen in 2015 for chest pain that was thought to be non anginal.  She otherwise has no past cardiac history.  She is presenting for chest discomfort.  This happens sporadically.  It is only a discomfort last for few seconds.  It might be 2 out of 10 in intensity.  It is not associated with activities although she is not particularly active.  She does household chores like vacuuming.  She denies any associated nausea vomiting or diaphoresis.  She is not having any new shortness of breath, PND or orthopnea.  She has had no new palpitations, presyncope or syncope.  She has been under stress because her cousin is living with her.   Past Medical History:  Diagnosis Date   AKI (acute kidney injury) (HCC) 05/23/2018   Arthritis    in bilateral knees   Cancer (HCC)    left ovarian cancer   GERD (gastroesophageal reflux disease)    History of DVT (deep vein thrombosis) 2019   had a catheter-related DVT during chemotherapy   Hyperlipidemia    Hypertension    Steroid myopathy 09/22/2018    Past Surgical History:  Procedure Laterality Date   ABDOMINAL HYSTERECTOMY     BIOPSY THYROID   2008   COLONOSCOPY     IR CV LINE INJECTION  07/15/2018   IR REMOVAL TUN ACCESS W/ PORT W/O FL MOD SED  11/24/2018   TONSILECTOMY, ADENOIDECTOMY, BILATERAL MYRINGOTOMY AND TUBES Bilateral 1957     Current Outpatient Medications  Medication Sig Dispense Refill   alendronate  (FOSAMAX ) 70 MG tablet TAKE 1 TABLET EVERY WEEK WITH A FULL GLASS OF WATER ON AN EMPTY STOMACH 12 tablet 3   Ascorbic Acid (VITAMIN C PO) Take 500 mg by mouth daily.     aspirin EC 81 MG tablet Take 81 mg  by mouth daily. Swallow whole.     BLACK COHOSH PO Take 1 capsule by mouth daily.     Calcium-Magnesium-Vitamin D (CALCIUM 1200+D3 PO) Take 1 tablet by mouth 2 (two) times daily.     celecoxib  (CELEBREX ) 100 MG capsule 1 po q d prn 60 capsule 0   Glucosamine HCl ([employer]S GLUCOSAMINE PO) 2 tabs by mouth in the morning and 1 tab by mouth at night     losartan  (COZAAR ) 50 MG tablet TAKE 1 TABLET EVERY DAY 90 tablet 3   metoprolol  tartrate (LOPRESSOR ) 50 MG tablet TAKE 1 TABLET TWICE DAILY 180 tablet 3   Multiple Vitamin (MULTIVITAMIN) tablet Take 1 tablet by mouth 2 (two) times daily.      Omega-3 Fatty Acids (OMEGA-3 FISH OIL PO) Take 1 tablet by mouth 2 (two) times daily.     pantoprazole  (PROTONIX ) 40 MG tablet TAKE 1 TABLET EVERY DAY 90 tablet 1   Probiotic Product (PROBIOTIC-10 PO) Take 1 tablet by mouth daily.     simvastatin  (ZOCOR ) 20 MG tablet TAKE 1 TABLET EVERY DAY 90 tablet 3   No current facility-administered medications for this visit.    Allergies:   Bee pollen and Pollen extract    Social History:  The  patient  reports that she has never smoked. She has never used smokeless tobacco. She reports that she does not drink alcohol and does not use drugs.   Family History:  The patient's family history includes Arthritis in her mother; Cancer in her cousin and maternal aunt; Colon cancer in her father; Coronary artery disease (age of onset: 30) in her father; Diabetes in her father; HLA-B27 positive in her daughter; Hyperlipidemia in her father; Hypertension in her father and mother; Ovarian cancer (age of onset: 35) in her cousin.    ROS:  Please see the history of present illness.   Otherwise, review of systems are positive for none.   All other systems are reviewed and negative.    PHYSICAL EXAM: VS:  BP (!) 155/90 (BP Location: Right Arm, Patient Position: Sitting, Cuff Size: Normal)   Pulse 60   Ht 4\' 10"  (1.473 m)   Wt 164 lb 9.6 oz (74.7 kg)   SpO2 97%   BMI 34.40 kg/m  ,  BMI Body mass index is 34.4 kg/m. GENERAL:  Well appearing HEENT:  Pupils equal round and reactive, fundi not visualized, oral mucosa unremarkable NECK:  No jugular venous distention, waveform within normal limits, carotid upstroke brisk and symmetric, no bruits, no thyromegaly LYMPHATICS:  No cervical, inguinal adenopathy LUNGS:  Clear to auscultation bilaterally BACK:  No CVA tenderness CHEST:  Unremarkable HEART:  PMI not displaced or sustained,S1 and S2 within normal limits, no S3, no S4, no clicks, no rubs, no murmurs ABD:  Flat, positive bowel sounds normal in frequency in pitch, no bruits, no rebound, no guarding, no midline pulsatile mass, no hepatomegaly, no splenomegaly EXT:  2 plus pulses throughout, no edema, no cyanosis no clubbing SKIN:  No rashes no nodules NEURO:  Cranial nerves II through XII grossly intact, motor grossly intact throughout PSYCH:  Cognitively intact, oriented to person place and time    EKG:     Sinus bradycardia, rate 48, axis within normal limits, intervals within normal limits, no acute ST-T wave changes.    Recent Labs: 11/04/2023: ALT 19; BUN 19; Creat 1.08; Hemoglobin 14.8; Platelets 257; Potassium 4.1; Sodium 139    Lipid Panel    Component Value Date/Time   CHOL 141 11/04/2023 1049   CHOL 146 02/29/2016 1027   TRIG 172 (H) 11/04/2023 1049   HDL 46 (L) 11/04/2023 1049   HDL 40 02/29/2016 1027   CHOLHDL 3.1 11/04/2023 1049   VLDL 51 (H) 02/26/2017 1007   LDLCALC 70 11/04/2023 1049      Wt Readings from Last 3 Encounters:  02/07/24 164 lb 9.6 oz (74.7 kg)  12/05/23 166 lb 9.6 oz (75.6 kg)  11/04/23 166 lb (75.3 kg)      Other studies Reviewed: Additional studies/ records that were reviewed today include: Labs. Review of the above records demonstrates:  Please see elsewhere in the note.     ASSESSMENT AND PLAN:  Precordial chest pain:   The patient's chest discomfort has nonanginal greater than anginal features.  She does have  cardiovascular risk factors. I will bring the patient back for a POET (Plain Old Exercise Test). This will allow me to screen for obstructive coronary disease, risk stratify and very importantly provide a prescription for exercise.  HTN: She is going to keep a blood pressure diary.  If her blood pressure is not at target I would most likely increase her Cozaar .  We also talked about therapeutic lifestyle changes to include weight loss and exercise.  Current medicines are reviewed at length with the patient today.  The patient does not have concerns regarding medicines.  The following changes have been made:  no change  Labs/ tests ordered today include:  No orders of the defined types were placed in this encounter.    Disposition:   FU with with me as needed.     Signed, Eilleen Grates, MD  02/07/2024 2:53 PM    [facility]

## 2024-02-07 ENCOUNTER — Ambulatory Visit: Attending: Cardiology | Admitting: Cardiology

## 2024-02-07 ENCOUNTER — Encounter: Payer: Self-pay | Admitting: Cardiology

## 2024-02-07 VITALS — BP 155/90 | HR 60 | Ht <= 58 in | Wt 164.6 lb

## 2024-02-07 DIAGNOSIS — R072 Precordial pain: Secondary | ICD-10-CM | POA: Diagnosis not present

## 2024-02-07 NOTE — Patient Instructions (Addendum)
 Medication Instructions:  Your physician recommends that you continue on your current medications as directed. Please refer to the Current Medication list given to you today.  *If you need a refill on your cardiac medications before your next appointment, please call your pharmacy*  Lab Work: NONE If you have labs (blood work) drawn today and your tests are completely normal, you will receive your results only by: MyChart Message (if you have MyChart) OR A paper copy in the mail If you have any lab test that is abnormal or we need to change your treatment, we will call you to review the results.  Testing/Procedures: Exercise Stress Test  Follow-Up: At [facility], you and your health needs are our priority.  As part of our continuing mission to provide you with exceptional heart care, our providers are all part of one team.  This team includes your primary Cardiologist (physician) and Advanced Practice Providers or APPs (Physician Assistants and Nurse Practitioners) who all work together to provide you with the care you need, when you need it.  Your next appointment:   As needed  Provider:   Lavonne Prairie, MD  We recommend signing up for the patient portal called "MyChart".  Sign up information is provided on this After Visit Summary.  MyChart is used to connect with patients for Virtual Visits (Telemedicine).  Patients are able to view lab/test results, encounter notes, upcoming appointments, etc.  Non-urgent messages can be sent to your provider as well.   To learn more about what you can do with MyChart, go to [email or web address].   Other Instructions  Exercise Stress Test Instructions  HOLD Metoprolol  (Lopressor ) day of test No caffeine including decaf and chocolate for 12 hours prior No food or drink for 3-4 hours prior

## 2024-02-13 ENCOUNTER — Encounter (HOSPITAL_COMMUNITY): Payer: Self-pay

## 2024-02-24 ENCOUNTER — Telehealth (HOSPITAL_COMMUNITY): Payer: Self-pay | Admitting: *Deleted

## 2024-02-24 ENCOUNTER — Encounter (HOSPITAL_COMMUNITY): Payer: Self-pay | Admitting: *Deleted

## 2024-02-24 NOTE — Telephone Encounter (Signed)
 Instructions for upcoming stress test sent via [employer].  Argentina Bees, RN

## 2024-02-28 DIAGNOSIS — Z0100 Encounter for examination of eyes and vision without abnormal findings: Secondary | ICD-10-CM | POA: Diagnosis not present

## 2024-02-28 DIAGNOSIS — H2513 Age-related nuclear cataract, bilateral: Secondary | ICD-10-CM | POA: Diagnosis not present

## 2024-02-28 DIAGNOSIS — H43393 Other vitreous opacities, bilateral: Secondary | ICD-10-CM | POA: Diagnosis not present

## 2024-03-05 ENCOUNTER — Ambulatory Visit: Payer: Self-pay | Admitting: Cardiology

## 2024-03-05 ENCOUNTER — Ambulatory Visit (HOSPITAL_COMMUNITY)
Admission: RE | Admit: 2024-03-05 | Discharge: 2024-03-05 | Disposition: A | Discharge status: Home / Self Care | Source: Ambulatory Visit | Attending: Cardiovascular Disease | Admitting: Cardiovascular Disease

## 2024-03-05 DIAGNOSIS — R072 Precordial pain: Secondary | ICD-10-CM | POA: Insufficient documentation

## 2024-03-05 LAB — EXERCISE TOLERANCE TEST
Angina Index: 0
Base ST Depression (mm): 0 mm
Duke Treadmill Score: 4
Estimated workload: 6.1
Exercise duration (min): 4 min
Exercise duration (sec): 18 s
MPHR: 145 {beats}/min
Peak HR: 130 {beats}/min
Percent HR: 89 %
Rest HR: 67 {beats}/min
ST Depression (mm): 0 mm

## 2024-03-22 ENCOUNTER — Encounter: Payer: Self-pay | Admitting: Cardiology

## 2024-03-23 NOTE — Telephone Encounter (Signed)
 Called patient left message on personal voice mail to call back.

## 2024-03-24 MED ORDER — LOSARTAN POTASSIUM 100 MG PO TABS: 100.0000 mg | ORAL_TABLET | Freq: Every day | ORAL | 3 refills | Status: AC

## 2024-03-24 NOTE — Telephone Encounter (Signed)
 Called patient left message on personal voice mail to call back.

## 2024-03-24 NOTE — Telephone Encounter (Signed)
 Spoke to patient Dr.Hochrein advised to increase Losartan  to 100 mg daily.Advised to monitor B/P daily and send readings through mychart.She will have a bmet done with PCP in 1 month and send results to Dr.Hochrein.

## 2024-05-04 ENCOUNTER — Ambulatory Visit (INDEPENDENT_AMBULATORY_CARE_PROVIDER_SITE_OTHER): Payer: Medicare HMO | Admitting: Nurse Practitioner

## 2024-05-04 ENCOUNTER — Encounter: Payer: Self-pay | Admitting: Nurse Practitioner

## 2024-05-04 VITALS — BP 180/100 | HR 51 | Temp 97.4°F | Ht <= 58 in | Wt 165.6 lb

## 2024-05-04 DIAGNOSIS — E781 Pure hyperglyceridemia: Secondary | ICD-10-CM

## 2024-05-04 DIAGNOSIS — G4700 Insomnia, unspecified: Secondary | ICD-10-CM | POA: Diagnosis not present

## 2024-05-04 DIAGNOSIS — I10 Essential (primary) hypertension: Secondary | ICD-10-CM | POA: Diagnosis not present

## 2024-05-04 DIAGNOSIS — R739 Hyperglycemia, unspecified: Secondary | ICD-10-CM | POA: Diagnosis not present

## 2024-05-04 DIAGNOSIS — M810 Age-related osteoporosis without current pathological fracture: Secondary | ICD-10-CM | POA: Diagnosis not present

## 2024-05-04 DIAGNOSIS — N1832 Chronic kidney disease, stage 3b: Secondary | ICD-10-CM | POA: Insufficient documentation

## 2024-05-04 MED ORDER — TRAZODONE HCL 50 MG PO TABS: 25.0000 mg | ORAL_TABLET | Freq: Every day | ORAL | 0 refills | Status: DC

## 2024-05-04 MED ORDER — AMLODIPINE BESYLATE 5 MG PO TABS: 5.0000 mg | ORAL_TABLET | Freq: Every day | ORAL | 0 refills | Status: DC
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
## 2024-05-05 ENCOUNTER — Other Ambulatory Visit: Payer: Self-pay

## 2024-05-05 ENCOUNTER — Ambulatory Visit: Payer: Self-pay | Admitting: Nurse Practitioner

## 2024-05-05 DIAGNOSIS — M1712 Unilateral primary osteoarthritis, left knee: Secondary | ICD-10-CM

## 2024-05-05 LAB — COMPREHENSIVE METABOLIC PANEL WITH GFR
AG Ratio: 1.7 (calc) (ref 1.0–2.5)
ALT: 21 U/L (ref 6–29)
AST: 21 U/L (ref 10–35)
Albumin: 4.6 g/dL (ref 3.6–5.1)
Alkaline phosphatase (APISO): 29 U/L — ABNORMAL LOW (ref 37–153)
BUN/Creatinine Ratio: 20 (calc) (ref 6–22)
BUN: 24 mg/dL (ref 7–25)
CO2: 29 mmol/L (ref 20–32)
Calcium: 10 mg/dL (ref 8.6–10.4)
Chloride: 103 mmol/L (ref 98–110)
Creat: 1.22 mg/dL — ABNORMAL HIGH (ref 0.60–1.00)
Globulin: 2.7 g/dL (ref 1.9–3.7)
Glucose, Bld: 103 mg/dL — ABNORMAL HIGH (ref 65–99)
Potassium: 4.1 mmol/L (ref 3.5–5.3)
Sodium: 140 mmol/L (ref 135–146)
Total Bilirubin: 1.2 mg/dL (ref 0.2–1.2)
Total Protein: 7.3 g/dL (ref 6.1–8.1)
eGFR: 46 mL/min/1.73m2 — ABNORMAL LOW (ref >=60)

## 2024-05-05 LAB — CBC WITH DIFFERENTIAL/PLATELET
Absolute Lymphocytes: 1520 {cells}/uL (ref 850–3900)
Absolute Monocytes: 638 {cells}/uL (ref 200–950)
Basophils Absolute: 58 {cells}/uL (ref 0–200)
Basophils Relative: 1 %
Eosinophils Absolute: 151 {cells}/uL (ref 15–500)
Eosinophils Relative: 2.6 %
HCT: 46 % — ABNORMAL HIGH (ref 35.0–45.0)
Hemoglobin: 15.2 g/dL (ref 11.7–15.5)
MCH: 33.5 pg — ABNORMAL HIGH (ref 27.0–33.0)
MCHC: 33 g/dL (ref 32.0–36.0)
MCV: 101.3 fL — ABNORMAL HIGH (ref 80.0–100.0)
MPV: 10.2 fL (ref 7.5–12.5)
Monocytes Relative: 11 %
Neutro Abs: 3434 {cells}/uL (ref 1500–7800)
Neutrophils Relative %: 59.2 %
Platelets: 291 Thousand/uL (ref 140–400)
RBC: 4.54 Million/uL (ref 3.80–5.10)
RDW: 12 % (ref 11.0–15.0)
Total Lymphocyte: 26.2 %
WBC: 5.8 Thousand/uL (ref 3.8–10.8)

## 2024-05-05 LAB — HEMOGLOBIN A1C
Hgb A1c MFr Bld: 6.3 % — ABNORMAL HIGH (ref <5.7)
Mean Plasma Glucose: 134 mg/dL
eAG (mmol/L): 7.4 mmol/L

## 2024-05-06 ENCOUNTER — Ambulatory Visit: Admitting: Orthopedic Surgery

## 2024-05-06 DIAGNOSIS — G4700 Insomnia, unspecified: Secondary | ICD-10-CM | POA: Insufficient documentation

## 2024-05-06 DIAGNOSIS — M1712 Unilateral primary osteoarthritis, left knee: Secondary | ICD-10-CM | POA: Diagnosis not present

## 2024-05-06 NOTE — Assessment & Plan Note (Signed)
 To continue dietary modifications and encouraged

## 2024-05-06 NOTE — Assessment & Plan Note (Signed)
 Continues on zocor , LDL at goal on last labs, continue dietary modifications

## 2024-05-06 NOTE — Assessment & Plan Note (Signed)
 Not at goal Add norvasc  5 mg daily  Continue losartan  100 mg, continue lopressor  50 mg twice dialy Low sodium diet  To monitor BP at home.

## 2024-05-06 NOTE — Assessment & Plan Note (Signed)
 Encourage proper hydration Follow metabolic panel Avoid nephrotoxic meds (NSAIDS)

## 2024-05-06 NOTE — Assessment & Plan Note (Signed)
 To stop fosamax  due to being on medication over 5 years  Continue cal and vit d with weight bearing exercises Follow up dexa

## 2024-05-06 NOTE — Assessment & Plan Note (Signed)
 Worsening insomnia to avoid benadryl  due to side effects  Start trazodone  at this time.

## 2024-05-07 ENCOUNTER — Encounter: Payer: Self-pay | Admitting: Orthopedic Surgery

## 2024-05-07 DIAGNOSIS — M1712 Unilateral primary osteoarthritis, left knee: Secondary | ICD-10-CM | POA: Diagnosis not present

## 2024-05-07 MED ORDER — LIDOCAINE HCL 1 % IJ SOLN
5.0000 mL | INTRAMUSCULAR | Status: AC | PRN
  Administered 2024-05-07: 5 mL

## 2024-05-07 MED ORDER — HYALURONAN 88 MG/4ML IX SOSY
88.0000 mg | PREFILLED_SYRINGE | INTRA_ARTICULAR | Status: AC | PRN
  Administered 2024-05-07: 88 mg via INTRA_ARTICULAR

## 2024-05-07 NOTE — Telephone Encounter (Signed)
 Please advise patient states she has a question about Fosamax . She said that you have taken her off fosamax  and was suppose to replace it with another medication.

## 2024-05-07 NOTE — Progress Notes (Signed)
   Procedure Note  Patient: Melissa Simpson             Date of Birth: [birth year]           MRN: [id number]             Visit Date: 05/06/2024  Procedures: Visit Diagnoses:  1. Arthritis of left knee     Large Joint Inj: L knee on 05/07/2024 7:42 PM Indications: diagnostic evaluation, joint swelling and pain Details: 18 G 1.5 in needle, superolateral approach  Arthrogram: No  Medications: 5 mL lidocaine  1 %; 88 mg Hyaluronan 88 MG/4ML Outcome: tolerated well, no immediate complications Procedure, treatment alternatives, risks and benefits explained, specific risks discussed. Consent was given by the patient. Immediately prior to procedure a time out was called to verify the correct patient, procedure, equipment, support staff and site/side marked as required. Patient was prepped and draped in the usual sterile fashion.    Lot #[id number]  Patient has done well with her last cortisone injection in January of this year.  The injections have worn off prior to this visit but overall they are helping her enough that she wants to continue.  May consider cortisone injection as a next step.  [name] or at least until her husband retires from his work which will be next year

## 2024-05-23 DIAGNOSIS — R051 Acute cough: Secondary | ICD-10-CM | POA: Diagnosis not present

## 2024-05-23 DIAGNOSIS — J189 Pneumonia, unspecified organism: Secondary | ICD-10-CM | POA: Diagnosis not present

## 2024-06-01 ENCOUNTER — Other Ambulatory Visit: Payer: Medicare HMO

## 2024-06-04 ENCOUNTER — Telehealth: Payer: Self-pay

## 2024-06-04 MED ORDER — SACCHAROMYCES BOULARDII 250 MG PO CAPS: 250.0000 mg | ORAL_CAPSULE | Freq: Two times a day (BID) | ORAL | 0 refills | Status: AC

## 2024-06-04 NOTE — Telephone Encounter (Signed)
 Message sent to patient via mychart and rx sent to pharmacy for probiotic

## 2024-06-04 NOTE — Telephone Encounter (Signed)
 Copied from CRM [id number]. Topic: Clinical - Medical Advice >> Jun 04, 2024  9:45 AM Diannia H wrote: Reason for CRM: Patient went to the urgent care on the 13th and was given some antibiotics which caused her to have diarrhea. She was done with the medicine on Monday but even still she is still having diarrhea. She is wanting to know could the provider send her something in or give her some guidance on what to do or if she needs to come in. She has an appointment on Monday but doesn't want to go the weekend with this going on. Could you assist? Patients callback number is [phone].

## 2024-06-04 NOTE — Telephone Encounter (Signed)
 Add probiotic twice daily (florastor)  Add fiber supplement (benefiber) Bland diet and advance as tolerates  If this does not improve follow up in office next week.

## 2024-06-08 ENCOUNTER — Ambulatory Visit: Admitting: Nurse Practitioner

## 2024-06-08 ENCOUNTER — Encounter: Payer: Self-pay | Admitting: Nurse Practitioner

## 2024-06-08 VITALS — BP 126/78 | HR 58 | Temp 97.7°F | Ht <= 58 in | Wt 165.8 lb

## 2024-06-08 DIAGNOSIS — Z23 Encounter for immunization: Secondary | ICD-10-CM | POA: Diagnosis not present

## 2024-06-08 DIAGNOSIS — G4700 Insomnia, unspecified: Secondary | ICD-10-CM | POA: Diagnosis not present

## 2024-06-08 DIAGNOSIS — Z636 Dependent relative needing care at home: Secondary | ICD-10-CM | POA: Diagnosis not present

## 2024-06-08 DIAGNOSIS — I10 Essential (primary) hypertension: Secondary | ICD-10-CM

## 2024-06-08 NOTE — Patient Instructions (Signed)
 Increase trazodone  to 50 mg (1 tablet at night) for sleep.

## 2024-06-08 NOTE — Progress Notes (Signed)
 Careteam: Patient Care Team: Melissa Harlene POUR, NP as PCP - General (Geriatric Medicine)  PLACE OF SERVICE:  [facility]IN[facility]  Advanced Directive information Does Patient Have a Medical Advance Directive?: Yes, Type of Advance Directive: Healthcare Power of [place];Living will, Does patient want to make changes to medical advance directive?: No - Patient declined  Allergies  Allergen Reactions   Bee Pollen Other (See Comments)   Pollen Extract     Chief Complaint  Patient presents with   Follow-up    4 week follow up on mood and bp    HPI:  Discussed the use of AI scribe software for clinical note transcription with the patient, who gave verbal consent to proceed.  History of Present Illness Melissa Simpson is a 73 year old female with hypertension and sleep disturbances who presents for a four-week follow-up.  She was started on Norvasc  (amlodipine ) 5 mg daily a month ago, in addition to her existing medications, losartan  100 mg and metoprolol  50 mg twice daily. She has not been checking her blood pressure at home recently due to being away and feeling unwell, but she has not experienced any side effects from the medication. She denies any swelling in her legs.  For her sleep disturbances, taking half a pill of her prescribed sleep medication has not been effective. She has not increased the dose to a full pill. She acknowledges ongoing stress related to her living situation, exacerbated by her daughter's temporary stay with her.  On September 12th, she developed a severe cough after being in a cold environment and was diagnosed with left-sided pneumonia at an urgent care visit. She was prescribed erythromycin and amoxicillin, which led to diarrhea persisting for two weeks. She has since started a fiber supplement and reports improvement in her symptoms as of yesterday. She continues to have a mild cough, which she attributes to chronic sinus drainage.  She has not been able  to engage in physical exercise due to knee pain, although she has access to a stationary bike at home.    Review of Systems:  Review of Systems  Constitutional:  Negative for chills, fever and weight loss.  HENT:  Negative for tinnitus.   Respiratory:  Negative for cough, sputum production and shortness of breath.   Cardiovascular:  Negative for chest pain, palpitations and leg swelling.  Gastrointestinal:  Negative for abdominal pain, constipation, diarrhea and heartburn.  Genitourinary:  Negative for dysuria, frequency and urgency.  Musculoskeletal:  Negative for back pain, falls, joint pain and myalgias.  Skin: Negative.   Neurological:  Negative for dizziness and headaches.  Psychiatric/Behavioral:  Positive for depression. Negative for memory loss. The patient has insomnia.     Past Medical History:  Diagnosis Date   AKI (acute kidney injury) 05/23/2018   Arthritis    in bilateral knees   Cancer (HCC)    left ovarian cancer   GERD (gastroesophageal reflux disease)    History of DVT (deep vein thrombosis) 2019   had a catheter-related DVT during chemotherapy   Hyperlipidemia    Hypertension    Steroid myopathy 09/22/2018   Past Surgical History:  Procedure Laterality Date   ABDOMINAL HYSTERECTOMY     BIOPSY THYROID   2008   COLONOSCOPY     IR CV LINE INJECTION  07/15/2018   IR REMOVAL TUN ACCESS W/ PORT W/O FL MOD SED  11/24/2018   TONSILECTOMY, ADENOIDECTOMY, BILATERAL MYRINGOTOMY AND TUBES Bilateral 1957   Social History:  reports that she has never smoked. She has never used smokeless tobacco. She reports that she does not drink alcohol and does not use drugs.  Family History  Problem Relation Age of Onset   Hypertension Mother    Arthritis Mother    Hypertension Father    Diabetes Father    Coronary artery disease Father 24       CABG   Hyperlipidemia Father    Colon cancer Father        53's   [id number] positive Daughter    Cancer Maternal Aunt         type unk   Ovarian cancer Cousin 13   Cancer Cousin        mouth    Medications: Patient's Medications  New Prescriptions   No medications on file  Previous Medications   AMLODIPINE  (NORVASC ) 5 MG TABLET    Take 1 tablet (5 mg total) by mouth daily.   ASCORBIC ACID (VITAMIN C PO)    Take 500 mg by mouth daily.   ASPIRIN EC 81 MG TABLET    Take 81 mg by mouth daily. Swallow whole.   BLACK COHOSH PO    Take 1 capsule by mouth daily.   CALCIUM-MAGNESIUM-VITAMIN D (CALCIUM 1200+D3 PO)    Take 1 tablet by mouth 2 (two) times daily.   GLUCOSAMINE HCL ([facility] GLUCOSAMINE PO)    2 tabs by mouth in the morning and 1 tab by mouth at night   LOSARTAN  (COZAAR ) 100 MG TABLET    Take 1 tablet (100 mg total) by mouth daily.   METOPROLOL  TARTRATE (LOPRESSOR ) 50 MG TABLET    TAKE 1 TABLET TWICE DAILY   MULTIPLE VITAMIN (MULTIVITAMIN) TABLET    Take 1 tablet by mouth 2 (two) times daily.    OMEGA-3 FATTY ACIDS (OMEGA-3 FISH OIL PO)    Take 1 tablet by mouth 2 (two) times daily.   PANTOPRAZOLE  (PROTONIX ) 40 MG TABLET    TAKE 1 TABLET EVERY DAY   PROBIOTIC PRODUCT (PROBIOTIC-10 PO)    Take 1 tablet by mouth daily.   SACCHAROMYCES BOULARDII (FLORASTOR) 250 MG CAPSULE    Take 1 capsule (250 mg total) by mouth 2 (two) times daily for 10 days.   SIMVASTATIN  (ZOCOR ) 20 MG TABLET    TAKE 1 TABLET EVERY DAY   TRAZODONE  (DESYREL ) 50 MG TABLET    Take 0.5-1 tablets (25-50 mg total) by mouth at bedtime.  Modified Medications   No medications on file  Discontinued Medications   No medications on file    Physical Exam:  Vitals:   06/08/24 1028  BP: 126/78  Pulse: (!) 58  Temp: 97.7 F (36.5 C)  SpO2: 96%  Weight: 165 lb 12.8 oz (75.2 kg)  Height: 4' 10 (1.473 m)   Body mass index is 34.65 kg/m. Wt Readings from Last 3 Encounters:  06/08/24 165 lb 12.8 oz (75.2 kg)  05/04/24 165 lb 9.6 oz (75.1 kg)  02/07/24 164 lb 9.6 oz (74.7 kg)    Physical Exam Constitutional:      General: She is not in acute  distress.    Appearance: She is well-developed. She is not diaphoretic.  HENT:     Head: Normocephalic and atraumatic.     Mouth/Throat:     Pharynx: No oropharyngeal exudate.  Eyes:     Conjunctiva/sclera: Conjunctivae normal.     Pupils: Pupils are equal, round, and reactive to light.  Cardiovascular:     Rate and Rhythm:  Normal rate and regular rhythm.     Heart sounds: Normal heart sounds.  Pulmonary:     Effort: Pulmonary effort is normal.     Breath sounds: Normal breath sounds.  Abdominal:     General: Bowel sounds are normal.     Palpations: Abdomen is soft.  Musculoskeletal:     Cervical back: Normal range of motion and neck supple.     Right lower leg: No edema.     Left lower leg: No edema.  Skin:    General: Skin is warm and dry.  Neurological:     Mental Status: She is alert.  Psychiatric:        Mood and Affect: Mood normal.     Labs reviewed: Basic Metabolic Panel: Recent Labs    11/04/23 1049 05/04/24 1035  NA 139 140  K 4.1 4.1  CL 103 103  CO2 26 29  GLUCOSE 96 103*  BUN 19 24  CREATININE 1.08* 1.22*  CALCIUM 9.6 10.0   Liver Function Tests: Recent Labs    11/04/23 1049 05/04/24 1035  AST 20 21  ALT 19 21  BILITOT 1.2 1.2  PROT 6.8 7.3   No results for input(s): LIPASE, AMYLASE in the last 8760 hours. No results for input(s): AMMONIA in the last 8760 hours. CBC: Recent Labs    11/04/23 1049 05/04/24 1035  WBC 10.6 5.8  NEUTROABS 8,204* 3,434  HGB 14.8 15.2  HCT 43.6 46.0*  MCV 99.5 101.3*  PLT 257 291   Lipid Panel: Recent Labs    11/04/23 1049  CHOL 141  HDL 46*  LDLCALC 70  TRIG 827*  CHOLHDL 3.1   TSH: No results for input(s): TSH in the last 8760 hours. A1C: Lab Results  Component Value Date   HGBA1C 6.3 (H) 05/04/2024     Assessment/Plan  Need for influenza vaccination -     Flu vaccine HIGH DOSE PF(Fluzone Trivalent)   Assessment and Plan Assessment & Plan Insomnia Insomnia persists  despite subtherapeutic trazodone  dose. - Increase trazodone  to 50 mg at night. - Advise trial of increased dose for one week, re-evaluate if necessary. - Encourage cardiovascular activity to aid sleep and help stress  Hypertension Hypertension well-controlled with current regimen. - Continue amlodipine  5 mg daily. - Continue losartan  100 mg daily. - Continue metoprolol  50 mg twice daily. - Encourage home blood pressure monitoring.  Caregiver stress Has support at home Continue lifestyle modifications to help  Discuss resources to help.    Return in about 4 months (around 10/08/2024) for routine follow up.  Whitaker Holderman K. Melissa BODILY [facility] [phone]

## 2024-06-25 ENCOUNTER — Ambulatory Visit: Payer: Self-pay | Admitting: Nurse Practitioner

## 2024-06-25 ENCOUNTER — Ambulatory Visit (HOSPITAL_BASED_OUTPATIENT_CLINIC_OR_DEPARTMENT_OTHER)
Admission: RE | Admit: 2024-06-25 | Discharge: 2024-06-25 | Disposition: A | Discharge status: Home / Self Care | Source: Ambulatory Visit | Attending: Nurse Practitioner | Admitting: Nurse Practitioner

## 2024-06-25 DIAGNOSIS — Z780 Asymptomatic menopausal state: Secondary | ICD-10-CM | POA: Diagnosis not present

## 2024-06-25 DIAGNOSIS — M85852 Other specified disorders of bone density and structure, left thigh: Secondary | ICD-10-CM | POA: Diagnosis not present

## 2024-06-25 DIAGNOSIS — E2839 Other primary ovarian failure: Secondary | ICD-10-CM | POA: Insufficient documentation

## 2024-06-25 DIAGNOSIS — M85851 Other specified disorders of bone density and structure, right thigh: Secondary | ICD-10-CM | POA: Diagnosis not present

## 2024-07-03 ENCOUNTER — Other Ambulatory Visit: Payer: Self-pay | Admitting: Nurse Practitioner

## 2024-07-06 ENCOUNTER — Other Ambulatory Visit: Payer: Self-pay

## 2024-07-06 DIAGNOSIS — I10 Essential (primary) hypertension: Secondary | ICD-10-CM

## 2024-07-06 DIAGNOSIS — G4700 Insomnia, unspecified: Secondary | ICD-10-CM

## 2024-07-06 MED ORDER — TRAZODONE HCL 50 MG PO TABS: 25.0000 mg | ORAL_TABLET | Freq: Every day | ORAL | 0 refills | Status: DC

## 2024-07-06 MED ORDER — AMLODIPINE BESYLATE 5 MG PO TABS: 5.0000 mg | ORAL_TABLET | Freq: Every day | ORAL | 0 refills | Status: DC

## 2024-07-13 ENCOUNTER — Encounter: Payer: Self-pay | Admitting: Radiology

## 2024-08-10 ENCOUNTER — Other Ambulatory Visit: Payer: Self-pay | Admitting: Nurse Practitioner

## 2024-08-10 DIAGNOSIS — K219 Gastro-esophageal reflux disease without esophagitis: Secondary | ICD-10-CM

## 2024-08-17 ENCOUNTER — Telehealth: Payer: Self-pay | Admitting: *Deleted

## 2024-08-17 NOTE — Telephone Encounter (Signed)
 Copied from CRM [id number]. Topic: Clinical - Prescription Issue >> Aug 17, 2024 11:40 AM Chiquita SQUIBB wrote: Reason for CRM: Patient is calling in stating that [facility] has an issue with her alendronate  (FOSAMAX ) 70 MG tablet prescription and has been trying to contact the office regarding getting a prescription over for it, but has not heard back. Patient stated she is completely out and takes it once a week on Wednesdays. Please advise patient on any updates.

## 2024-08-17 NOTE — Telephone Encounter (Signed)
 Patient notified and agreed.

## 2024-08-17 NOTE — Telephone Encounter (Signed)
 Medication has been stopped- she was on medication over the recommended timeframe and now needing drug holiday  Discussed at last OV  Osteoporosis without current pathological fracture, unspecified osteoporosis type Assessment & Plan: To stop fosamax  due to being on medication over 5 years  Continue cal and vit d with weight bearing exercises Follow up dexa

## 2024-08-17 NOTE — Telephone Encounter (Signed)
 Medication is not in patient's current medication list.  Please Advise.

## 2024-09-23 ENCOUNTER — Other Ambulatory Visit: Payer: Self-pay | Admitting: Nurse Practitioner

## 2024-09-23 DIAGNOSIS — G4700 Insomnia, unspecified: Secondary | ICD-10-CM

## 2024-09-23 DIAGNOSIS — I10 Essential (primary) hypertension: Secondary | ICD-10-CM

## 2024-10-05 ENCOUNTER — Encounter: Payer: Medicare HMO | Admitting: Nurse Practitioner

## 2024-10-09 ENCOUNTER — Encounter: Payer: Self-pay | Admitting: Nurse Practitioner

## 2024-10-09 ENCOUNTER — Ambulatory Visit: Payer: Self-pay | Admitting: Nurse Practitioner

## 2024-10-09 VITALS — BP 130/80 | HR 51 | Temp 97.1°F | Ht <= 58 in | Wt 158.0 lb

## 2024-10-09 DIAGNOSIS — F5101 Primary insomnia: Secondary | ICD-10-CM

## 2024-10-09 DIAGNOSIS — I10 Essential (primary) hypertension: Secondary | ICD-10-CM | POA: Diagnosis not present

## 2024-10-09 DIAGNOSIS — J302 Other seasonal allergic rhinitis: Secondary | ICD-10-CM

## 2024-10-09 DIAGNOSIS — N1832 Chronic kidney disease, stage 3b: Secondary | ICD-10-CM

## 2024-10-09 DIAGNOSIS — K219 Gastro-esophageal reflux disease without esophagitis: Secondary | ICD-10-CM | POA: Diagnosis not present

## 2024-10-09 DIAGNOSIS — E781 Pure hyperglyceridemia: Secondary | ICD-10-CM | POA: Diagnosis not present

## 2024-10-09 DIAGNOSIS — Z8543 Personal history of malignant neoplasm of ovary: Secondary | ICD-10-CM | POA: Diagnosis not present

## 2024-10-09 DIAGNOSIS — R739 Hyperglycemia, unspecified: Secondary | ICD-10-CM

## 2024-10-09 NOTE — Progress Notes (Signed)
 "   Careteam: Patient Care Team: Melissa Harlene POUR, NP as PCP - General (Geriatric Medicine)  PLACE OF SERVICE:  [facility]IN[facility]  Advanced Directive information Does Patient Have a Medical Advance Directive?: Yes, Type of Advance Directive: Healthcare Power of [place];Living will, Does patient want to make changes to medical advance directive?: No - Patient declined  Allergies[1]  Chief Complaint  Patient presents with   Medical Management of Chronic Issues    4 month follow-up. Discussed need for covid vaccine(declined).     HPI:  Discussed the use of AI scribe software for clinical note transcription with the patient, who gave verbal consent to proceed.  History of Present Illness Melissa Simpson is a 74 year old female who presents for a six-month follow-up.  She experiences insomnia, characterized by waking up around 3 to 4 AM and being unable to return to sleep for two to three hours. She attributes this to stress and congestion affecting her breathing. She has been using trazodone , which was previously increased, but the improvement has been minimal.  She describes chronic nasal congestion that has persisted for many months, possibly worsening over time. Initially, she used Flonase, which provided immediate relief but did not last through the night. Recently, she switched to a nasal spray decongestant, using it every 10 to 12 hours, which works for about six to eight hours. She is unsure of the active ingredient. She recalls allergy testing from over 20 years ago, which led to allergy shots, but she discontinued them. She has used Zyrtec  and Claritin during pollen season but not consistently for chronic congestion.   She is on several medications for her chronic conditions, including Zocor  (simvastatin ) 20 mg daily, losartan  (Cozaar ) 100 mg daily, amlodipine  5 mg daily, metoprolol  50 mg twice daily, aspirin, and Protonix  for acid reflux. Her acid reflux has improved but still  occurs occasionally.   She has a history of chronic kidney disease and hyperglycemia.  She mentions joint pain that has worsened since completing chemotherapy for left ovarian endometrial cancer, which has been in remission for nearly six years. She is not currently following up with an oncologist but has her CA-125 checked during gynecological visits. No pain with urination, blood in urine, vaginal bleeding, or blood in bowels. Reports regular bowel movements.     Review of Systems:  Review of Systems  Constitutional:  Negative for chills, fever and weight loss.  HENT:  Positive for congestion. Negative for tinnitus.   Respiratory:  Negative for cough, sputum production and shortness of breath.   Cardiovascular:  Negative for chest pain, palpitations and leg swelling.  Gastrointestinal:  Negative for abdominal pain, constipation, diarrhea and heartburn.  Genitourinary:  Negative for dysuria, frequency and urgency.  Musculoskeletal:  Negative for back pain, falls, joint pain and myalgias.  Skin: Negative.   Neurological:  Negative for dizziness and headaches.  Psychiatric/Behavioral:  Negative for depression and memory loss. The patient has insomnia.     Past Medical History:  Diagnosis Date   AKI (acute kidney injury) 05/23/2018   Arthritis    in bilateral knees   Cancer (HCC)    left ovarian cancer   GERD (gastroesophageal reflux disease)    History of DVT (deep vein thrombosis) 2019   had a catheter-related DVT during chemotherapy   Hyperlipidemia    Hypertension    Steroid myopathy 09/22/2018   Past Surgical History:  Procedure Laterality Date   ABDOMINAL HYSTERECTOMY     BIOPSY THYROID   2008   COLONOSCOPY     IR CV LINE INJECTION  07/15/2018   IR REMOVAL TUN ACCESS W/ PORT W/O FL MOD SED  11/24/2018   TONSILECTOMY, ADENOIDECTOMY, BILATERAL MYRINGOTOMY AND TUBES Bilateral 1957   Social History:   reports that she has never smoked. She has never used smokeless tobacco.  She reports that she does not drink alcohol and does not use drugs.  Family History  Problem Relation Age of Onset   Hypertension Mother    Arthritis Mother    Hypertension Father    Diabetes Father    Coronary artery disease Father 31       CABG   Hyperlipidemia Father    Colon cancer Father        57's   HLA-B27 positive Daughter    Cancer Maternal Aunt        type unk   Ovarian cancer Cousin 45   Cancer Cousin        mouth    Medications: Patient's Medications  New Prescriptions   No medications on file  Previous Medications   AMLODIPINE  (NORVASC ) 5 MG TABLET    TAKE 1 TABLET EVERY DAY   ASCORBIC ACID (VITAMIN C PO)    Take 500 mg by mouth daily.   ASPIRIN EC 81 MG TABLET    Take 81 mg by mouth daily. Swallow whole.   BLACK COHOSH PO    Take 1 capsule by mouth daily.   CALCIUM-MAGNESIUM-VITAMIN D (CALCIUM 1200+D3 PO)    Take 1 tablet by mouth 2 (two) times daily.   GLUCOSAMINE HCL ([facility] GLUCOSAMINE PO)    2 tabs by mouth in the morning and 1 tab by mouth at night   LOSARTAN  (COZAAR ) 100 MG TABLET    Take 1 tablet (100 mg total) by mouth daily.   METOPROLOL  TARTRATE (LOPRESSOR ) 50 MG TABLET    TAKE 1 TABLET TWICE DAILY   MULTIPLE VITAMIN (MULTIVITAMIN) TABLET    Take 1 tablet by mouth 2 (two) times daily.    OMEGA-3 FATTY ACIDS (OMEGA-3 FISH OIL PO)    Take 1 tablet by mouth 2 (two) times daily.   PANTOPRAZOLE  (PROTONIX ) 40 MG TABLET    TAKE 1 TABLET EVERY DAY   PROBIOTIC PRODUCT (PROBIOTIC-10 PO)    Take 1 tablet by mouth daily.   SIMVASTATIN  (ZOCOR ) 20 MG TABLET    TAKE 1 TABLET EVERY DAY   TRAZODONE  (DESYREL ) 50 MG TABLET    TAKE 1/2 TO 1 TABLET AT BEDTIME  Modified Medications   No medications on file  Discontinued Medications   No medications on file    Physical Exam:  Vitals:   10/09/24 1120  BP: 130/80  Pulse: (!) 51  Temp: (!) 97.1 F (36.2 C)  SpO2: 93%  Weight: 158 lb (71.7 kg)  Height: 4' 10 (1.473 m)   Body mass index is 33.02 kg/m. Wt  Readings from Last 3 Encounters:  10/09/24 158 lb (71.7 kg)  06/08/24 165 lb 12.8 oz (75.2 kg)  05/04/24 165 lb 9.6 oz (75.1 kg)    Physical Exam Constitutional:      General: She is not in acute distress.    Appearance: She is well-developed. She is not diaphoretic.  HENT:     Head: Normocephalic and atraumatic.     Right Ear: Tympanic membrane, ear canal and external ear normal.     Left Ear: Tympanic membrane, ear canal and external ear normal.     Nose: Congestion and rhinorrhea  present.     Mouth/Throat:     Pharynx: No oropharyngeal exudate.  Eyes:     Conjunctiva/sclera: Conjunctivae normal.     Pupils: Pupils are equal, round, and reactive to light.  Cardiovascular:     Rate and Rhythm: Normal rate and regular rhythm.     Heart sounds: Normal heart sounds.  Pulmonary:     Effort: Pulmonary effort is normal.     Breath sounds: Normal breath sounds.  Abdominal:     General: Bowel sounds are normal.     Palpations: Abdomen is soft.  Musculoskeletal:     Cervical back: Normal range of motion and neck supple.     Right lower leg: No edema.     Left lower leg: No edema.  Skin:    General: Skin is warm and dry.  Neurological:     Mental Status: She is alert.  Psychiatric:        Mood and Affect: Mood normal.     Labs reviewed: Basic Metabolic Panel: Recent Labs    11/04/23 1049 05/04/24 1035  NA 139 140  K 4.1 4.1  CL 103 103  CO2 26 29  GLUCOSE 96 103*  BUN 19 24  CREATININE 1.08* 1.22*  CALCIUM 9.6 10.0   Liver Function Tests: Recent Labs    11/04/23 1049 05/04/24 1035  AST 20 21  ALT 19 21  BILITOT 1.2 1.2  PROT 6.8 7.3   No results for input(s): LIPASE, AMYLASE in the last 8760 hours. No results for input(s): AMMONIA in the last 8760 hours. CBC: Recent Labs    11/04/23 1049 05/04/24 1035  WBC 10.6 5.8  NEUTROABS 8,204* 3,434  HGB 14.8 15.2  HCT 43.6 46.0*  MCV 99.5 101.3*  PLT 257 291   Lipid Panel: Recent Labs     11/04/23 1049  CHOL 141  HDL 46*  LDLCALC 70  TRIG 827*  CHOLHDL 3.1   TSH: No results for input(s): TSH in the last 8760 hours. A1C: Lab Results  Component Value Date   HGBA1C 6.3 (H) 05/04/2024     Assessment/Plan Assessment and Plan Assessment & Plan Chronic rhinitis Nasal congestion likely due to allergies. Rebound congestion from nasal spray possible. - Discontinued decongestant nasal spray. - Restarted Flonase, one spray each nostril twice daily. - Started Zyrtec  or Claritin, 10 mg orally daily. - Use neti pot or nasal wash with sterile saline daily, especially at night. - Use humidifier in home. - Avoid forceful nose blowing. - Consider referral to allergist or ENT if symptoms persist.  Primary insomnia Difficulty maintaining sleep, possibly exacerbated by nasal congestion. - Address nasal congestion to improve sleep quality.  Essential hypertension Blood pressure well-controlled on current regimen. - Continue Losartan  100 mg daily, Amlodipine  5 mg daily, Metoprolol  50 mg twice daily.  Chronic kidney disease, stage 3b Chronic kidney disease stage 3b. - Ordered blood work to recheck kidney function. Encourage proper hydration Follow metabolic panel Avoid nephrotoxic meds (NSAIDS)  Gastroesophageal reflux disease GERD symptoms well-controlled with current medication. - Continue Pantoprazole  40 mg daily.  Hyperglycemia Monitoring required for stable blood sugar levels. - Ordered A1c test to monitor blood sugar levels.  History of ovarian and endometrial cancer, in remission Ovarian and endometrial cancer in remission for six years. CA-125 monitored by gynecologist. - Recommended yearly follow-up with oncologist or gynecologist. - Continue monitoring CA-125 levels annually.  Hyperlipidemia.  Due for cholesterol check. - Ordered cholesterol test. - Continue Simvastatin  20 mg daily.  Follow up in 6 months- [place]- will  try to follow up once more prior to leaving.   Myleigh Amara K. Melissa BODILY [facility] [phone]      [1]  Allergies Allergen Reactions   Bee Pollen Other (See Comments)   Pollen Extract    "

## 2024-10-09 NOTE — Patient Instructions (Signed)
 Stop decongestant spary Netipot or saline wash daily Plain nasal saline spray throughout the day as needed humidifier in the home to help with the dry air Avoid forcefully blowing nose To take Loratadine or cetrizine (generic for Claritin or zyrtec ) 10 mg by mouth daily for allergies.  -restart flonase 1 spray into both nares daily

## 2024-10-10 LAB — HEMOGLOBIN A1C
Hgb A1c MFr Bld: 5.8 % — ABNORMAL HIGH
Mean Plasma Glucose: 120 mg/dL
eAG (mmol/L): 6.6 mmol/L

## 2024-10-10 LAB — COMPREHENSIVE METABOLIC PANEL WITH GFR
AG Ratio: 1.8 (calc) (ref 1.0–2.5)
ALT: 19 U/L (ref 6–29)
AST: 19 U/L (ref 10–35)
Albumin: 4.4 g/dL (ref 3.6–5.1)
Alkaline phosphatase (APISO): 28 U/L — ABNORMAL LOW (ref 37–153)
BUN/Creatinine Ratio: 14 (calc) (ref 6–22)
BUN: 17 mg/dL (ref 7–25)
CO2: 31 mmol/L (ref 20–32)
Calcium: 10 mg/dL (ref 8.6–10.4)
Chloride: 101 mmol/L (ref 98–110)
Creat: 1.25 mg/dL — ABNORMAL HIGH (ref 0.60–1.00)
Globulin: 2.5 g/dL (ref 1.9–3.7)
Glucose, Bld: 97 mg/dL (ref 65–99)
Potassium: 4.4 mmol/L (ref 3.5–5.3)
Sodium: 139 mmol/L (ref 135–146)
Total Bilirubin: 1.1 mg/dL (ref 0.2–1.2)
Total Protein: 6.9 g/dL (ref 6.1–8.1)
eGFR: 45 mL/min/{1.73_m2} — ABNORMAL LOW

## 2024-10-10 LAB — CBC WITH DIFFERENTIAL/PLATELET
Absolute Lymphocytes: 1340 {cells}/uL (ref 850–3900)
Absolute Monocytes: 581 {cells}/uL (ref 200–950)
Basophils Absolute: 29 {cells}/uL (ref 0–200)
Basophils Relative: 0.5 %
Eosinophils Absolute: 200 {cells}/uL (ref 15–500)
Eosinophils Relative: 3.5 %
HCT: 43 % (ref 35.9–46.0)
Hemoglobin: 14.4 g/dL (ref 11.7–15.5)
MCH: 32.7 pg (ref 27.0–33.0)
MCHC: 33.5 g/dL (ref 31.6–35.4)
MCV: 97.7 fL (ref 81.4–101.7)
MPV: 9.7 fL (ref 7.5–12.5)
Monocytes Relative: 10.2 %
Neutro Abs: 3551 {cells}/uL (ref 1500–7800)
Neutrophils Relative %: 62.3 %
Platelets: 278 10*3/uL (ref 140–400)
RBC: 4.4 Million/uL (ref 3.80–5.10)
RDW: 11.7 % (ref 11.0–15.0)
Total Lymphocyte: 23.5 %
WBC: 5.7 10*3/uL (ref 3.8–10.8)

## 2024-10-10 LAB — LIPID PANEL
Cholesterol: 123 mg/dL
HDL: 45 mg/dL — ABNORMAL LOW
LDL Cholesterol (Calc): 53 mg/dL
Non-HDL Cholesterol (Calc): 78 mg/dL
Total CHOL/HDL Ratio: 2.7 (calc)
Triglycerides: 171 mg/dL — ABNORMAL HIGH

## 2024-10-12 ENCOUNTER — Ambulatory Visit: Payer: Self-pay | Admitting: Nurse Practitioner

## 2024-11-09 ENCOUNTER — Ambulatory Visit: Admitting: Nurse Practitioner

## 2025-02-15 ENCOUNTER — Ambulatory Visit: Admitting: Nurse Practitioner
